# Patient Record
Sex: Male | Born: 1960 | Race: White | Hispanic: No | Marital: Married | Smoking: Never smoker
Health system: Southern US, Community
[De-identification: ages and names within clinical notes are randomized; demographics above are authoritative.]

## PROBLEM LIST (undated history)

## (undated) DIAGNOSIS — N200 Calculus of kidney: Secondary | ICD-10-CM

## (undated) DIAGNOSIS — K219 Gastro-esophageal reflux disease without esophagitis: Secondary | ICD-10-CM

## (undated) DIAGNOSIS — R1310 Dysphagia, unspecified: Secondary | ICD-10-CM

## (undated) DIAGNOSIS — I255 Ischemic cardiomyopathy: Secondary | ICD-10-CM

## (undated) DIAGNOSIS — R42 Dizziness and giddiness: Secondary | ICD-10-CM

## (undated) DIAGNOSIS — I509 Heart failure, unspecified: Secondary | ICD-10-CM

## (undated) DIAGNOSIS — I4901 Ventricular fibrillation: Secondary | ICD-10-CM

## (undated) DIAGNOSIS — I10 Essential (primary) hypertension: Secondary | ICD-10-CM

## (undated) DIAGNOSIS — I2510 Atherosclerotic heart disease of native coronary artery without angina pectoris: Secondary | ICD-10-CM

## (undated) DIAGNOSIS — F32A Depression, unspecified: Secondary | ICD-10-CM

## (undated) DIAGNOSIS — Z8669 Personal history of other diseases of the nervous system and sense organs: Secondary | ICD-10-CM

## (undated) DIAGNOSIS — R413 Other amnesia: Secondary | ICD-10-CM

## (undated) DIAGNOSIS — E782 Mixed hyperlipidemia: Secondary | ICD-10-CM

## (undated) DIAGNOSIS — I119 Hypertensive heart disease without heart failure: Secondary | ICD-10-CM

## (undated) DIAGNOSIS — F329 Major depressive disorder, single episode, unspecified: Secondary | ICD-10-CM

## (undated) DIAGNOSIS — G931 Anoxic brain damage, not elsewhere classified: Secondary | ICD-10-CM

## (undated) DIAGNOSIS — F419 Anxiety disorder, unspecified: Secondary | ICD-10-CM

## (undated) HISTORY — PX: PEG TUBE REMOVAL: SHX2187

## (undated) HISTORY — DX: Personal history of other diseases of the nervous system and sense organs: Z86.69

## (undated) HISTORY — DX: Dizziness and giddiness: R42

## (undated) HISTORY — PX: TRACHEOSTOMY CLOSURE: SHX6132

## (undated) HISTORY — DX: Anxiety disorder, unspecified: F41.9

## (undated) HISTORY — PX: ESOPHAGOGASTRODUODENOSCOPY: SHX1529

## (undated) HISTORY — DX: Mixed hyperlipidemia: E78.2

## (undated) HISTORY — PX: PEG TUBE PLACEMENT: SUR1034

## (undated) HISTORY — DX: Calculus of kidney: N20.0

## (undated) HISTORY — DX: Dysphagia, unspecified: R13.10

## (undated) HISTORY — PX: KIDNEY STONE SURGERY: SHX686

## (undated) HISTORY — PX: OTHER SURGICAL HISTORY: SHX169

## (undated) HISTORY — DX: Depression, unspecified: F32.A

## (undated) HISTORY — DX: Ventricular fibrillation: I49.01

## (undated) HISTORY — DX: Major depressive disorder, single episode, unspecified: F32.9

---

## 2000-09-21 ENCOUNTER — Emergency Department (HOSPITAL_COMMUNITY): Admission: EM | Admit: 2000-09-21 | Discharge: 2000-09-21 | Payer: Self-pay | Admitting: Pulmonary Disease

## 2000-09-21 ENCOUNTER — Encounter: Payer: Self-pay | Admitting: Emergency Medicine

## 2001-12-28 ENCOUNTER — Encounter: Payer: Self-pay | Admitting: Emergency Medicine

## 2001-12-28 ENCOUNTER — Emergency Department (HOSPITAL_COMMUNITY): Admission: EM | Admit: 2001-12-28 | Discharge: 2001-12-28 | Payer: Self-pay | Admitting: Emergency Medicine

## 2005-09-28 HISTORY — PX: CARDIAC CATHETERIZATION: SHX172

## 2005-10-11 ENCOUNTER — Emergency Department (HOSPITAL_COMMUNITY): Admission: EM | Admit: 2005-10-11 | Discharge: 2005-10-11 | Payer: Self-pay | Admitting: Emergency Medicine

## 2005-10-18 ENCOUNTER — Ambulatory Visit: Payer: Self-pay | Admitting: *Deleted

## 2005-10-18 ENCOUNTER — Inpatient Hospital Stay (HOSPITAL_COMMUNITY): Admission: EM | Admit: 2005-10-18 | Discharge: 2005-10-22 | Payer: Self-pay | Admitting: Emergency Medicine

## 2005-10-30 ENCOUNTER — Ambulatory Visit: Payer: Self-pay | Admitting: Cardiology

## 2005-12-08 ENCOUNTER — Ambulatory Visit: Payer: Self-pay | Admitting: Cardiology

## 2005-12-25 ENCOUNTER — Ambulatory Visit: Payer: Self-pay | Admitting: Cardiology

## 2005-12-29 ENCOUNTER — Ambulatory Visit: Payer: Self-pay | Admitting: Cardiology

## 2006-03-30 ENCOUNTER — Inpatient Hospital Stay (HOSPITAL_COMMUNITY): Admission: EM | Admit: 2006-03-30 | Discharge: 2006-05-01 | Payer: Self-pay | Admitting: Cardiology

## 2006-03-30 ENCOUNTER — Ambulatory Visit: Payer: Self-pay | Admitting: Emergency Medicine

## 2006-03-30 ENCOUNTER — Ambulatory Visit: Payer: Self-pay | Admitting: Pulmonary Disease

## 2006-03-30 ENCOUNTER — Encounter: Payer: Self-pay | Admitting: Emergency Medicine

## 2006-03-30 ENCOUNTER — Ambulatory Visit: Payer: Self-pay | Admitting: Cardiology

## 2006-03-30 HISTORY — PX: OTHER SURGICAL HISTORY: SHX169

## 2006-03-30 HISTORY — PX: CARDIAC CATHETERIZATION: SHX172

## 2006-03-31 ENCOUNTER — Encounter: Payer: Self-pay | Admitting: Cardiovascular Disease

## 2006-04-21 ENCOUNTER — Ambulatory Visit: Payer: Self-pay | Admitting: Physical Medicine & Rehabilitation

## 2006-05-01 ENCOUNTER — Inpatient Hospital Stay (HOSPITAL_COMMUNITY)
Admission: RE | Admit: 2006-05-01 | Discharge: 2006-06-02 | Payer: Self-pay | Admitting: Physical Medicine & Rehabilitation

## 2006-05-11 ENCOUNTER — Ambulatory Visit: Payer: Self-pay | Admitting: Dentistry

## 2006-06-17 ENCOUNTER — Encounter
Admission: RE | Admit: 2006-06-17 | Discharge: 2006-09-15 | Payer: Self-pay | Admitting: Physical Medicine & Rehabilitation

## 2006-06-17 ENCOUNTER — Emergency Department (HOSPITAL_COMMUNITY): Admission: EM | Admit: 2006-06-17 | Discharge: 2006-06-17 | Payer: Self-pay | Admitting: Family Medicine

## 2006-06-18 ENCOUNTER — Ambulatory Visit: Payer: Self-pay | Admitting: Cardiology

## 2006-07-10 ENCOUNTER — Encounter
Admission: RE | Admit: 2006-07-10 | Discharge: 2006-10-08 | Payer: Self-pay | Admitting: Physical Medicine & Rehabilitation

## 2006-07-10 ENCOUNTER — Ambulatory Visit: Payer: Self-pay | Admitting: Physical Medicine & Rehabilitation

## 2006-08-18 ENCOUNTER — Ambulatory Visit (HOSPITAL_COMMUNITY): Admission: RE | Admit: 2006-08-18 | Discharge: 2006-08-18 | Payer: Self-pay | Admitting: Internal Medicine

## 2006-08-21 ENCOUNTER — Ambulatory Visit: Payer: Self-pay | Admitting: Cardiology

## 2006-08-21 ENCOUNTER — Observation Stay (HOSPITAL_COMMUNITY): Admission: EM | Admit: 2006-08-21 | Discharge: 2006-08-21 | Payer: Self-pay | Admitting: Emergency Medicine

## 2006-09-16 ENCOUNTER — Encounter
Admission: RE | Admit: 2006-09-16 | Discharge: 2006-12-15 | Payer: Self-pay | Admitting: Physical Medicine & Rehabilitation

## 2006-09-18 ENCOUNTER — Emergency Department (HOSPITAL_COMMUNITY): Admission: EM | Admit: 2006-09-18 | Discharge: 2006-09-18 | Payer: Self-pay | Admitting: Emergency Medicine

## 2006-09-22 ENCOUNTER — Ambulatory Visit: Payer: Self-pay | Admitting: Cardiology

## 2006-10-06 ENCOUNTER — Encounter
Admission: RE | Admit: 2006-10-06 | Discharge: 2007-01-04 | Payer: Self-pay | Admitting: Physical Medicine & Rehabilitation

## 2006-10-06 ENCOUNTER — Ambulatory Visit: Payer: Self-pay | Admitting: Physical Medicine & Rehabilitation

## 2007-04-02 ENCOUNTER — Encounter
Admission: RE | Admit: 2007-04-02 | Discharge: 2007-04-05 | Payer: Self-pay | Admitting: Physical Medicine & Rehabilitation

## 2007-04-02 ENCOUNTER — Ambulatory Visit: Payer: Self-pay | Admitting: Physical Medicine & Rehabilitation

## 2007-04-08 ENCOUNTER — Ambulatory Visit: Payer: Self-pay | Admitting: Cardiology

## 2007-06-17 ENCOUNTER — Observation Stay (HOSPITAL_COMMUNITY): Admission: EM | Admit: 2007-06-17 | Discharge: 2007-06-18 | Payer: Self-pay | Admitting: Emergency Medicine

## 2007-06-17 ENCOUNTER — Ambulatory Visit: Payer: Self-pay | Admitting: Cardiology

## 2007-06-18 ENCOUNTER — Encounter: Payer: Self-pay | Admitting: Cardiology

## 2007-08-02 ENCOUNTER — Ambulatory Visit: Payer: Self-pay | Admitting: Cardiology

## 2008-04-21 ENCOUNTER — Ambulatory Visit: Payer: Self-pay | Admitting: Cardiology

## 2008-05-05 ENCOUNTER — Encounter: Payer: Self-pay | Admitting: Cardiology

## 2008-05-05 ENCOUNTER — Ambulatory Visit: Payer: Self-pay

## 2008-07-20 ENCOUNTER — Encounter
Admission: RE | Admit: 2008-07-20 | Discharge: 2008-10-18 | Payer: Self-pay | Admitting: Physical Medicine & Rehabilitation

## 2008-07-21 ENCOUNTER — Ambulatory Visit: Payer: Self-pay | Admitting: Physical Medicine & Rehabilitation

## 2008-09-15 ENCOUNTER — Ambulatory Visit: Payer: Self-pay | Admitting: Physical Medicine & Rehabilitation

## 2008-10-04 ENCOUNTER — Ambulatory Visit (HOSPITAL_COMMUNITY)
Admission: RE | Admit: 2008-10-04 | Discharge: 2008-10-04 | Payer: Self-pay | Admitting: Physical Medicine & Rehabilitation

## 2008-10-16 ENCOUNTER — Encounter (INDEPENDENT_AMBULATORY_CARE_PROVIDER_SITE_OTHER): Payer: Self-pay | Admitting: *Deleted

## 2008-10-25 ENCOUNTER — Telehealth: Payer: Self-pay | Admitting: Cardiology

## 2008-11-10 DIAGNOSIS — F329 Major depressive disorder, single episode, unspecified: Secondary | ICD-10-CM | POA: Insufficient documentation

## 2008-11-10 DIAGNOSIS — I2109 ST elevation (STEMI) myocardial infarction involving other coronary artery of anterior wall: Secondary | ICD-10-CM

## 2008-11-10 DIAGNOSIS — I2589 Other forms of chronic ischemic heart disease: Secondary | ICD-10-CM

## 2008-11-10 DIAGNOSIS — E785 Hyperlipidemia, unspecified: Secondary | ICD-10-CM | POA: Insufficient documentation

## 2008-11-10 DIAGNOSIS — F411 Generalized anxiety disorder: Secondary | ICD-10-CM | POA: Insufficient documentation

## 2008-11-10 DIAGNOSIS — I4901 Ventricular fibrillation: Secondary | ICD-10-CM

## 2008-11-10 DIAGNOSIS — Z8679 Personal history of other diseases of the circulatory system: Secondary | ICD-10-CM | POA: Insufficient documentation

## 2008-11-10 DIAGNOSIS — I10 Essential (primary) hypertension: Secondary | ICD-10-CM | POA: Insufficient documentation

## 2008-11-10 DIAGNOSIS — Z8669 Personal history of other diseases of the nervous system and sense organs: Secondary | ICD-10-CM

## 2008-11-10 DIAGNOSIS — I255 Ischemic cardiomyopathy: Secondary | ICD-10-CM | POA: Insufficient documentation

## 2008-11-10 DIAGNOSIS — R1310 Dysphagia, unspecified: Secondary | ICD-10-CM | POA: Insufficient documentation

## 2008-11-15 ENCOUNTER — Encounter
Admission: RE | Admit: 2008-11-15 | Discharge: 2008-11-17 | Payer: Self-pay | Admitting: Physical Medicine & Rehabilitation

## 2008-11-17 ENCOUNTER — Ambulatory Visit: Payer: Self-pay | Admitting: Cardiology

## 2008-11-17 ENCOUNTER — Telehealth: Payer: Self-pay | Admitting: Cardiology

## 2008-11-17 ENCOUNTER — Ambulatory Visit: Payer: Self-pay | Admitting: Physical Medicine & Rehabilitation

## 2008-11-20 ENCOUNTER — Telehealth (INDEPENDENT_AMBULATORY_CARE_PROVIDER_SITE_OTHER): Payer: Self-pay | Admitting: *Deleted

## 2008-11-21 ENCOUNTER — Ambulatory Visit: Payer: Self-pay

## 2008-11-21 ENCOUNTER — Encounter: Payer: Self-pay | Admitting: Cardiology

## 2008-11-24 ENCOUNTER — Encounter: Payer: Self-pay | Admitting: Cardiology

## 2008-11-29 ENCOUNTER — Telehealth: Payer: Self-pay | Admitting: Cardiology

## 2008-11-30 ENCOUNTER — Telehealth: Payer: Self-pay | Admitting: Cardiology

## 2008-12-01 ENCOUNTER — Ambulatory Visit: Payer: Self-pay | Admitting: Cardiology

## 2008-12-01 ENCOUNTER — Inpatient Hospital Stay (HOSPITAL_COMMUNITY): Admission: EM | Admit: 2008-12-01 | Discharge: 2008-12-05 | Payer: Self-pay | Admitting: Emergency Medicine

## 2009-01-03 ENCOUNTER — Encounter (HOSPITAL_COMMUNITY): Admission: RE | Admit: 2009-01-03 | Discharge: 2009-02-02 | Payer: Self-pay | Admitting: Cardiology

## 2009-01-04 ENCOUNTER — Ambulatory Visit: Payer: Self-pay | Admitting: Internal Medicine

## 2009-01-04 ENCOUNTER — Encounter: Payer: Self-pay | Admitting: Nurse Practitioner

## 2009-01-04 DIAGNOSIS — G931 Anoxic brain damage, not elsewhere classified: Secondary | ICD-10-CM | POA: Insufficient documentation

## 2009-01-31 ENCOUNTER — Encounter: Payer: Self-pay | Admitting: Cardiology

## 2009-03-29 ENCOUNTER — Encounter: Payer: Self-pay | Admitting: Cardiology

## 2009-04-06 ENCOUNTER — Encounter (INDEPENDENT_AMBULATORY_CARE_PROVIDER_SITE_OTHER): Payer: Self-pay | Admitting: *Deleted

## 2009-04-27 ENCOUNTER — Ambulatory Visit: Payer: Self-pay | Admitting: Cardiology

## 2009-07-16 ENCOUNTER — Ambulatory Visit: Payer: Self-pay | Admitting: Cardiology

## 2009-07-16 DIAGNOSIS — I2510 Atherosclerotic heart disease of native coronary artery without angina pectoris: Secondary | ICD-10-CM

## 2009-07-19 ENCOUNTER — Telehealth (INDEPENDENT_AMBULATORY_CARE_PROVIDER_SITE_OTHER): Payer: Self-pay | Admitting: *Deleted

## 2009-07-20 LAB — CONVERTED CEMR LAB
Alkaline Phosphatase: 109 units/L (ref 39–117)
BUN: 10 mg/dL (ref 6–23)
Bilirubin, Direct: 0.1 mg/dL (ref 0.0–0.3)
CO2: 25 meq/L (ref 19–32)
Calcium: 9.3 mg/dL (ref 8.4–10.5)
Chloride: 105 meq/L (ref 96–112)
Cholesterol: 148 mg/dL (ref 0–200)
Creatinine, Ser: 1.2 mg/dL (ref 0.4–1.5)
GFR calc non Af Amer: 68.55 mL/min (ref >60)
LDL Cholesterol: 91 mg/dL (ref 0–99)
Potassium: 4.4 meq/L (ref 3.5–5.1)
Total CHOL/HDL Ratio: 4
Total Protein: 7 g/dL (ref 6.0–8.3)
VLDL: 16 mg/dL (ref 0.0–40.0)

## 2009-07-23 ENCOUNTER — Ambulatory Visit: Payer: Self-pay | Admitting: Internal Medicine

## 2009-07-23 ENCOUNTER — Encounter (HOSPITAL_COMMUNITY): Admission: RE | Admit: 2009-07-23 | Discharge: 2009-10-05 | Payer: Self-pay | Admitting: Cardiology

## 2009-07-23 ENCOUNTER — Ambulatory Visit: Payer: Self-pay

## 2009-08-10 ENCOUNTER — Telehealth: Payer: Self-pay | Admitting: Cardiology

## 2009-11-01 ENCOUNTER — Encounter (HOSPITAL_COMMUNITY): Admission: RE | Admit: 2009-11-01 | Discharge: 2009-12-01 | Payer: Self-pay | Admitting: Family Medicine

## 2009-12-06 ENCOUNTER — Encounter (HOSPITAL_COMMUNITY): Admission: RE | Admit: 2009-12-06 | Discharge: 2010-01-05 | Payer: Self-pay | Admitting: Family Medicine

## 2010-07-03 ENCOUNTER — Ambulatory Visit
Admission: RE | Admit: 2010-07-03 | Discharge: 2010-07-03 | Payer: Self-pay | Source: Home / Self Care | Attending: Cardiology | Admitting: Cardiology

## 2010-07-03 ENCOUNTER — Encounter: Payer: Self-pay | Admitting: Cardiology

## 2010-07-09 ENCOUNTER — Telehealth (INDEPENDENT_AMBULATORY_CARE_PROVIDER_SITE_OTHER): Payer: Self-pay | Admitting: Radiology

## 2010-07-10 ENCOUNTER — Encounter: Payer: Self-pay | Admitting: *Deleted

## 2010-07-10 ENCOUNTER — Encounter: Payer: Self-pay | Admitting: Cardiology

## 2010-07-10 ENCOUNTER — Ambulatory Visit: Admission: RE | Admit: 2010-07-10 | Discharge: 2010-07-10 | Payer: Self-pay | Source: Home / Self Care

## 2010-07-10 ENCOUNTER — Encounter (HOSPITAL_COMMUNITY)
Admission: RE | Admit: 2010-07-10 | Discharge: 2010-07-30 | Payer: Self-pay | Source: Home / Self Care | Attending: Cardiology | Admitting: Cardiology

## 2010-07-28 LAB — CONVERTED CEMR LAB
BUN: 10 mg/dL (ref 6–23)
Basophils Absolute: 0 10*3/uL (ref 0.0–0.1)
Bilirubin, Direct: 0.1 mg/dL (ref 0.0–0.3)
CO2: 31 meq/L (ref 19–32)
Chloride: 105 meq/L (ref 96–112)
Cholesterol: 139 mg/dL (ref 0–200)
Eosinophils Relative: 4.6 % (ref 0.0–5.0)
GFR calc Af Amer: 103 mL/min
GFR calc non Af Amer: 85 mL/min
HCT: 41 % (ref 39.0–52.0)
Hemoglobin: 13.8 g/dL (ref 13.0–17.0)
LDL Cholesterol: 82 mg/dL (ref 0–99)
Monocytes Absolute: 0.7 10*3/uL (ref 0.1–1.0)
Neutrophils Relative %: 49.7 % (ref 43.0–77.0)
Potassium: 4.5 meq/L (ref 3.5–5.1)
RBC: 4.64 M/uL (ref 4.22–5.81)
Sodium: 140 meq/L (ref 135–145)
Sodium: 141 meq/L (ref 135–145)
Triglycerides: 94 mg/dL (ref 0–149)

## 2010-07-30 NOTE — Progress Notes (Signed)
Summary: rx called in today  Phone Note Refill Request Call back at Home Phone [phone] Message from:  Patient  Refills Requested: Medication #1:  LASIX 40MG  QD THIS WAS PERSCRIBED BY A DIFFERENT MD BUT SINCE DR [place] Souter WANTS HIM TO STAY ON IT CAN WE PLZ CALL INTO [facility] ON [address].  Initial call taken by: Omer Jack,  August 10, 2009 12:48 PM Caller: Spouse    Prescriptions: FUROSEMIDE 20 MG TABS (FUROSEMIDE) 1 tab once daily  #30 x 11   Entered by:   Danielle Rankin, CMA   Authorized by:   Gaylord Shih, MD, [facility]   Signed by:   Danielle Rankin, CMA on 08/10/2009   Method used:   Telephoned to ...       [facility]  [address]  [phone]* (retail)       [address]       [place], [place]  [address]       Ph: [phone] or [phone]       Fax: [phone]   RxID:   [id number]

## 2010-07-30 NOTE — Assessment & Plan Note (Signed)
Summary: PER CHECK OUT/SF   Visit Type:  ROV Primary Provider:  Fuller Mandril  CC:  fatigue taking napps.. wife says pt wants to go bed at 6pm and is not sleeping well..wife states that pt does c/o quick sharp pain in the middle of his chest once in awhile.  History of Present Illness: [name] returns today for followup of his ischemic cardiac myopathy. Other than some sharp chest pain, he's had no specific complaints. His biggest concern from his wife standpoint is fatigue.  He denies orthopnea, PND, peripheral edema, shortness of breath, syncope or palpitations.  Current Medications (verified): 1)  Nitroglycerin 0.4 Mg Subl (Nitroglycerin) .... One Tablet Under Tongue Every 5 Minutes As Needed For Chest Pain---May Repeat Times Three 2)  Coreg 25 Mg Tabs (Carvedilol) .Marland Kitchen.. 1 Tab Two Times A Day 3)  Plavix 75 Mg Tabs (Clopidogrel Bisulfate) .... Take One Tablet By Mouth Daily 4)  Furosemide 20 Mg Tabs (Furosemide) .Marland Kitchen.. 1 Tab Once Daily 5)  Omeprazole 40 Mg Cpdr (Omeprazole) .Marland Kitchen.. 1 Tab Once Daily 6)  Crestor 10 Mg Tabs (Rosuvastatin Calcium) .Marland Kitchen.. 1 Tab Once Daily 7)  Aspirin Ec 325 Mg Tbec (Aspirin) .... Take One Tablet By Mouth Daily 8)  Prozac 20 Mg Caps (Fluoxetine Hcl) .Marland Kitchen.. 1 Tab Once Daily 9)  Eq Loratadine 10 Mg Tabs (Loratadine) .... As Needed 10)  Viagra 50 Mg Tabs (Sildenafil Citrate) .... As Needed  Allergies: 1)  ! * Dilaudid 2)  ! Seldane 3)  ! * Pseudoephedrine 4)  ! Ativan 5)  ! Lipitor  Past History:  Past Medical History: Last updated: 01/04/2009 CARDIOMYOPATHY, ISCHEMIC (ICD-414.8)      a. EF 45% by LV Gram 11/2005 VENTRICULAR FIBRILLATION ARREST 03/2006(ICD-427.41) CAD (ICD-414.00)      a. s/p nstemi 09/2005 with PCI/BMS of the LAD      b. s/p vf arrest/cardiogenic shock/nstemi 03/2006 with occluded LAD           i.  03/2006 pci LAD (taxus DES x2), ptca d2.      c. 11/2008 - abnl MV - ant. scar with peri-infarct ischemia      d. 11/2008 - pci LAD with  Xience DES (distal to previous stents) HYPERTENSION, UNSPECIFIED (ICD-401.9) HYPERLIPIDEMIA-MIXED (ICD-272.4)  ANXIETY (ICD-300.00) DEPRESSION (ICD-311) DYSPHAGIA UNSPECIFIED (ICD-787.20) SEIZURE DISORDER, HX OF (ICD-V12.49) ANOXIC BRAIN INJURY   Past Surgical History: Last updated: 11/10/2008 Urolithiasis Cardiac catheterization with stenting October 21, 2005, with bare metal stent to the mid LAD (Minivision 2.5 x 18 mm) with Provisional balloon angioplasty to the second diagonal.   Cardiac catheterization on March 31, 2006, with stenting of the mid LAD with Taxus 2.5 x 20 mm and 2.5 x 8 mm stents.     Bedside tracheostomy with #6 Shiley. 04/14/2006 PROCEDURES:  04/13/2006 1. Esophagogastroduodenoscopy. 2. Percutaneous endoscopic gastrostomy tube placement.           Family History: Last updated: 01/04/2009 Mother:  began having myocardial infarctions in her mid to early 38's.  S/P CABG. Father:  myocardial infarction at approximately age 34 Siblings:  None of the patient's siblings have premature coronary artery disease.    Social History: Last updated: 11/10/2008 Married  Tobacco Use - No.  Alcohol Use - no Drug Use - no  Risk Factors: Smoking Status: never (11/10/2008)  Review of Systems       negative other than history of present illness  Vital Signs:  Patient profile:   50 year old male Height:  73 inches Weight:      243 pounds BMI:     32.18 Pulse rate:   62 / minute Pulse rhythm:   regular BP sitting:   100 / 70  (left arm) Cuff size:   large  Vitals Entered By: Danielle Rankin, CMA (July 16, 2009 9:02 AM)  Physical Exam  General:  Well developed, well nourished, in no acute distress. Head:  normocephalic and atraumatic Eyes:  PERRLA/EOM intact; conjunctiva and lids normal. Mouth:  poor dentition.   Msk:  Back normal, normal gait. Muscle strength and tone normal. Pulses:  pulses normal in all 4 extremities Extremities:  No clubbing or  cyanosis. Neurologic:  Alert and oriented x 3. Skin:  Intact without lesions or rashes. Psych:  Baseline   Problems:  Medical Problems Added: 1)  Dx of Encounter For Long-term Use of Other Medications  (ICD-V58.69) 2)  Dx of Cad, Native Vessel  (ICD-414.01)  Impression & Recommendations:  Problem # 1:  CAD, NATIVE VESSEL (ICD-414.01) Assessment Unchanged His fatigue could be an ischemic equivalent. He is very difficult historian but his chest pain today does not sound coronary. It has been 6 months since his revascularization with a stent to his LAD. We will repeat his Myoview. His updated medication list for this problem includes:    Nitroglycerin 0.4 Mg Subl (Nitroglycerin) ..... One tablet under tongue every 5 minutes as needed for chest pain---may repeat times three    Coreg 25 Mg Tabs (Carvedilol) .Marland Kitchen... 1 tab two times a day    Plavix 75 Mg Tabs (Clopidogrel bisulfate) .Marland Kitchen... Take one tablet by mouth daily    Aspirin Ec 325 Mg Tbec (Aspirin) .Marland Kitchen... Take one tablet by mouth daily  Problem # 2:  MYOCARDIAL INFARCTION, ACUTE, ANTERIOR Skylarr Liz (ICD-410.10) Assessment: Unchanged  His updated medication list for this problem includes:    Nitroglycerin 0.4 Mg Subl (Nitroglycerin) ..... One tablet under tongue every 5 minutes as needed for chest pain---may repeat times three    Coreg 25 Mg Tabs (Carvedilol) .Marland Kitchen... 1 tab two times a day    Plavix 75 Mg Tabs (Clopidogrel bisulfate) .Marland Kitchen... Take one tablet by mouth daily    Aspirin Ec 325 Mg Tbec (Aspirin) .Marland Kitchen... Take one tablet by mouth daily  Problem # 3:  HYPERLIPIDEMIA-MIXED (ICD-272.4) I will obtain fasting lipids today and a [facility] metabolic panel. His updated medication list for this problem includes:    Crestor 10 Mg Tabs (Rosuvastatin calcium) .Marland Kitchen... 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 Please refer to the Current Medication list given to you today. 4)  Your physician has requested that you have an adenosine myoview.  For further information please visit [email or web address]  Please follow instruction sheet, as given.

## 2010-07-30 NOTE — Progress Notes (Signed)
Summary: Nuclear Pre-Procedure  Phone Note Outgoing Call Call back at [facility] Phone [phone]   Call placed by: Stanton Kidney, EMT-P,  July 19, 2009 1:58 PM Action Taken: Phone Call Completed Summary of Call: Reviewed information on Myoview Information Sheet (see scanned document for further details).  Spoke with Patients wife.     Nuclear Med Background Indications for Stress Test: Evaluation for Ischemia, Stent Patency, PTCA Patency   History: Angioplasty, Echo, Heart Catheterization, Myocardial Infarction, Myocardial Perfusion Study, Stents  History Comments: 4/07 Stent LAD,PTCA DX2 10/07 AWMI>cardiac arrest(with hypoxic brain injury post MI)>Cath: total occlusion LAD/stent thrombus>stent LAD,residual LAD/CFX;h/o ICM 11/09 Echo: EF=65% 5/10 MPS:EF=62%, scar apex,lateral,anterior w/ mod peri-infarct ischemia 6/10 Heart Cath: EF=45% Hx. ICM, seizure disorder,hypoxic brain injury after cardiac arrest.  Symptoms: Chest Pain, Fatigue  Symptoms Comments: .   Nuclear Pre-Procedure Cardiac Risk Factors: Family History - CAD, Hypertension, Lipids, Obesity Height (in): 73  Nuclear Med Study Referring MD:  Valera Castle

## 2010-07-30 NOTE — Assessment & Plan Note (Signed)
Summary: Cardiology Nuclear Study  Nuclear Med Background Indications for Stress Test: Evaluation for Ischemia, Stent Patency, PTCA Patency   History: Angioplasty, Echo, Heart Catheterization, Myocardial Infarction, Myocardial Perfusion Study, Stents  History Comments: 4/07 Stent LAD,PTCA DX2 10/07 AWMI>cardiac arrest(with hypoxic brain injury post MI)>Cath: total occlusion LAD/stent thrombus>stent LAD,residual LAD/CFX;h/o ICM 11/09 Echo: EF=65% 5/10 MPS:EF=62%, scar apex,lateral,anterior w/ mod peri-infarct ischemia 6/10 Heart Cath: EF=45% Hx. ICM, seizure disorder,hypoxic brain injury after cardiac arrest.  Symptoms: Chest Pain, Fatigue  Symptoms Comments: .   Nuclear Pre-Procedure Cardiac Risk Factors: Family History - CAD, Hypertension, Lipids, Obesity Caffeine/Decaff Intake: None NPO After: 8:00 PM Lungs: clear IV 0.9% NS with Angio Cath: 22g     IV Site: (R) Hand IV Started by: Irean Hong RN Chest Size (in) 46     Height (in): 73 Weight (lb): 239 BMI: 31.65  Nuclear Med Study 1 or 2 day study:  1 day     Stress Test Type:  Eugenie Birks Reading MD:  Dietrich Pates, MD     Referring MD:  Valera Castle Resting Radionuclide:  Technetium 92m Tetrofosmin     Resting Radionuclide Dose:  11.0 mCi  Stress Radionuclide:  Technetium 35m Tetrofosmin     Stress Radionuclide Dose:  33.0 mCi   Stress Protocol   Lexiscan: 0.4 mg   Stress Test Technologist:  Milana Na EMT-P     Nuclear Technologist:  Burna Mortimer Deal RT-N  Rest Procedure  Myocardial perfusion imaging was performed at rest 45 minutes following the intravenous administration of Myoview Technetium 5m Tetrofosmin.  Stress Procedure  The patient received IV Lexiscan 0.4 mg over 15-seconds.  Myoview injected at 30-seconds. No symptoms with infusion. There were no significant changes with infusion.  Quantitative spect images were obtained after a 45 minute delay.  QPS Raw Data Images:  Soft tissue (diaphragm, bowel  activity) underlie heart. Stress Images:  Defect with decreased counts in the anterolateral wall (mid, distal), inferolateral wall (distal) and apex. Rest Images:  Minimal improvement from the stress images. Transient Ischemic Dilatation:  1.10  (Normal <1.22)  Lung/Heart Ratio:  .38  (Normal <0.45)  Quantitative Gated Spect Images QGS EDV:  148 ml QGS ESV:  63 ml QGS EF:  57 % QGS cine images:  Distal anterior, lateral and apical hypokinesis/akinesis.   Overall Impression  Exercise Capacity: Lexiscan BP Response: Normal blood pressure response. Clinical Symptoms: No chest pain ECG Impression: No significant ST segment change suggestive of ischemia. Overall Impression: Scar with mild periinfarct ischemia in the distal anterolateral, apical and distal inferolateral walls. Overall Impression Comments: Since previous study stress defect is less prominent, improvement is less prominent.  Appended Document: Cardiology Nuclear Study STABLE  Appended Document: Cardiology Nuclear Study LMTCB./CY  Appended Document: Cardiology Nuclear Study PT AWARE./CY

## 2010-08-01 NOTE — Assessment & Plan Note (Signed)
Summary: 1 yr f/u   Visit Type:  Follow-up Primary Provider:  Fuller Mandril  CC:  chest pressure & sharp pains / SOB.  History of Present Illness: [name] Cummings comes in today for chest discomfort and a history of coronary disease.  He is a difficult historian because of his previous anoxic brain injury. However, his wife says even having more complaints of chest discomfort than usual. He also some mild dyspnea on exertion.  A stress Myoview a year and a half ago showed ischemia. Showed an 80% lesion beyond the stent. This was opened successfully and he's done well.  Denies orthopnea, PND or edema. He is compliant with his medicines.  Clinical Reports Reviewed:  Cardiac Cath:  12/04/2008: Cardiac Cath Findings:   Left ventriculogram:  The left ventriculogram was performed in the RAO   projection showed hypokinesis of the anterolateral Jennalynn Rivard and apex.  The   estimated fraction was 45%.      Following stenting of the lesion in the mid LAD, the stenosis improved   from 80% to 0%.      CONCLUSIONS:   1. Coronary artery disease, status post previous percutaneous coronary       interventions as described above with 40% narrowing in the proximal       LAD, 30% narrowing within the stent in the mid LAD, and 80%       narrowing in the mid LAD after the stent, 70-80% narrowing in the       large ramus branch of the circumflex artery with 40% ostial       stenosis in the dominant circumflex artery, and 80% narrowing in       the proximal portion of a nondominant right coronary artery with       anterolateral Eriyanna Kofoed and apical Yann Biehn hypokinesis and estimated       ejection fraction of 45%.   2. Successful PCI of the lesion in the mid LAD distal to the       previously placed stent using a XIENCE drug-eluting stent with       improvement in center narrowing from 80% to 0%.      DISPOSITION:  The patient returned to [facility] room for further   observation.               [name] Cummings  [name] Chance, MD, [facility]   Electronically Signed     Cardiac Cath Findings:   Left ventriculogram:  The left ventriculogram was performed in the RAO   projection showed hypokinesis of the anterolateral Natale Thoma and apex.  The   estimated fraction was 45%.      Following stenting of the lesion in the mid LAD, the stenosis improved   from 80% to 0%.      CONCLUSIONS:   1. Coronary artery disease, status post previous percutaneous coronary       interventions as described above with 40% narrowing in the proximal       LAD, 30% narrowing within the stent in the mid LAD, and 80%       narrowing in the mid LAD after the stent, 70-80% narrowing in the       large ramus branch of the circumflex artery with 40% ostial       stenosis in the dominant circumflex artery, and 80% narrowing in       the proximal portion of a nondominant right coronary artery with       anterolateral Hanson Medeiros and apical Lafern Brinkley  hypokinesis and estimated       ejection fraction of 45%.   2. Successful PCI of the lesion in the mid LAD distal to the       previously placed stent using a XIENCE drug-eluting stent with       improvement in center narrowing from 80% to 0%.      DISPOSITION:  The patient returned to [facility] room for further   observation.               [name] Cummings [name] Chance, MD, [facility]   Electronically Signed   03/31/2006: Cardiac Cath Findings:   CONCLUSION:  1. Acute anterior Vollie Aaron myocardial infarction (non-ST-elevation myocardial      infarction) with out-of-hospital ventricular fibrillation arrest and      cardiogenic shock.  2. Total occlusion of the proximal to mid left anterior descending due to      stent thrombosis in the previous bare-metal stent, 60% stenosis in the      proximal left anterior descending, 80% stenosis in the marginal branch      of the circumflex artery, no significant obstruction in a small     nondominant right coronary and anterolateral Kullen Tomasetti hypokinesis and      apical Harriet Bollen akinesis with  an estimated fraction of 30%.  3. Successful percutaneous coronary intervention of the totally occluded      proximal to mid left anterior descending (stent thrombosis) using 2      overlapping Taxus stents with improvement in the percentage in area of      narrowing from 100% to 0%, but with significant diffuse disease in the      left anterior descending.   DISPOSITION:  The patient will be returned to the post-angioplasty unit for  further observation.  His outlook remains guarded, both from a neurological  standpoint and from a cardiac standpoint.         ______________________________  Everardo Beals. [name] Chance, MD, [facility]  10/21/2005: Cardiac Cath Findings:   COMPLICATIONS:  None.   IMPRESSION/RECOMMENDATIONS:  Successful bare metal stenting of the mid left  anterior descending with balloon angioplasty of the second diagonal branch  with excellent angiographic result.  The patient should be maintained on  Plavix for a year and aspirin indefinitely.  High-dose statin will be  initiated.   Salvadore Farber, M.D. [facility]  Electronically Signed Cardiac Cath Findings:   DIAGNOSES:  1.  Two-vessel coronary artery disease as outlined including a 95% mid left      anterior descending stenosis involving the second diagonal branch origin      as well as 60 to 70% disease in the proximal circumflex with 60% diffuse      disease in the proximal portion of a large obtuse marginal branch.  2.  The coronary system is left dominant.  3.  Left ventricular ejection fraction of approximately 60% with no focal      anterior or inferior Heddy Vidana motion abnormality, 1 to 2% mitral      regurgitation in the setting of ventricular ectopy and a left      ventricular end-diastolic pressure of 13 mmHg.   DISCUSSION:  I reviewed the results with the patient and his extended  family. Dr. Samule Ohm and I reviewed the films. One wonders whether the patient  recent seizure episode was perhaps provoked by an ischemic event  with  associated hypoxia, particularly in light of his fairly reassuring  neurological evaluation at this point. His symptoms are concerning for  an  acute coronary syndrome, and the plan at this point will be to proceed with  a percutaneous intervention to address the left anterior descending stenosis  and subsequently optimize medical therapy. Intervention could be considered  on the circumflex, driven by symptom control and clinical stability. Further  plans to follow.         ______________________________  Jonelle Sidle, M.D. [facility]  Nuclear Study:  07/23/2009:  Excerise capacity: Lexiscan  Blood Pressure response: Normal blood pressure response  Clinical symptoms: No chest pain  ECG impression: No significant ST segment change suggestive of ischemia  Overall impression: Scar with mild periinfarct ischemia in the distal anterolateral, apical and distal inferolateral walls.  Overall Impression Comments: Since previous study stress defect is less prominent, improvement is less prominent.  Dietrich Pates, MD, [facility]   11/21/2008:  Excerise capacity: Adenosine study with no exercise  Blood Pressure response: Normal blood pressure response  Clinical symptoms: None  ECG impression: No significant ST segment change suggestive of ischemia  Overall impression: There is scar at the apex and the distal lateral Teaghan Melrose and the distal anterior Latreshia Beauchaine. There is moderate peri-infarct ischemia.  Willa Rough, MD, [facility]   06/18/2007:   Reason for Exam:  Chest pain.   ADENOSINE MYOVIEW REPORT:   Description of Test:  One-day rest stress protocol.  Adenosine was   infused in the standard fashion.  Myoview was injected at rest and at   peak exercise.   EKG Data:  Resting EKG showed sinus rhythm with a question of   previous anterior infarct.  There were no STT wave changes with   adenosine.   Raw images show no evidence of patient motion.  There was no abnormal   extracardiac uptake noted.    Scintigraphic Data:  Images were reconstructed in the horizontal and   vertical long axises as well as the short axis.  There was evidence   of a previous apical and distal anterolateral Charne Mcbrien infarct.  There is   perhaps mild overlying ischemia in the distal anterior Ulice Follett.   However, study is somewhat suboptimal in quality.   Post-stress gated Carel Carrier motion shows ejection fraction of 67%.  End   diastolic volume is 150 cc.  End systolic volume is 65 cc.  There is   apical akinesis.  There is hypokinesis of the distal anterior Viyan Rosamond.   CONCLUSION:   This is an abnormal adenosine Myoview.  There is evidence of a   previous apical and distal anterior Nhan Qualley infarct.  There is a   question of mild overlying ischemia in the anterior Stark Aguinaga.  Additional Information  [id number] RESULT STATUS : P  External image : [id number]  External IF Update Timestamp : 2007-06-18:16:[id number]  10/20/2005:    IMPRESSION:   Grossly positive study with pharmacologically induced ischemia in the   distal anterior Vickie Melnik, left ventricular apex and distal lateral Jalani Cullifer.   These segments also demonstrate hypokinesis on the gated study with   overall intact left ventricular function and estimated ejection   fraction of 60%.    Read By:  Irish Lack,  M.D.   Released By:  Irish Lack,  M.D.   Current Medications (verified): 1)  Nitroglycerin 0.4 Mg Subl (Nitroglycerin) .... One Tablet Under Tongue Every 5 Minutes As Needed For Chest Pain---May Repeat Times Three 2)  Coreg 25 Mg Tabs (Carvedilol) .Marland Kitchen.. 1 Tab Two Times A Day 3)  Plavix 75 Mg Tabs (Clopidogrel Bisulfate) .... Take One Tablet By Mouth Daily  4)  Furosemide 40 Mg Tabs (Furosemide) .... Take One Tablet By Mouth Daily. 5)  Omeprazole 40 Mg Cpdr (Omeprazole) .Marland Kitchen.. 1 Tab Once Daily 6)  Crestor 10 Mg Tabs (Rosuvastatin Calcium) .Marland Kitchen.. 1 Tab Once Daily 7)  Aspirin Ec 325 Mg Tbec (Aspirin) .... Take One Tablet By Mouth Daily 8)  Lexapro 10 Mg Tabs  (Escitalopram Oxalate) .... Take 1 Tablet By Mouth Once A Day 9)  Xyzal 5 Mg Tabs (Levocetirizine Dihydrochloride) .... Take 1 Tablet By Mouth Once A Day 10)  Cialis 5 Mg Tabs (Tadalafil) .... As Needed 11)  Toviaz 8 Mg Xr24h-Tab (Fesoterodine Fumarate) .... Take 1 Tablet By Mouth Once A Day  Allergies (verified): 1)  ! * Dilaudid 2)  ! Seldane 3)  ! * Pseudoephedrine 4)  ! Ativan 5)  ! Lipitor  Past History:  Past Medical History: Last updated: 01/04/2009 CARDIOMYOPATHY, ISCHEMIC (ICD-414.8)      a. EF 45% by LV Gram 11/2005 VENTRICULAR FIBRILLATION ARREST 03/2006(ICD-427.41) CAD (ICD-414.00)      a. s/p nstemi 09/2005 with PCI/BMS of the LAD      b. s/p vf arrest/cardiogenic shock/nstemi 03/2006 with occluded LAD           i.  03/2006 pci LAD (taxus DES x2), ptca d2.      c. 11/2008 - abnl MV - ant. scar with peri-infarct ischemia      d. 11/2008 - pci LAD with Xience DES (distal to previous stents) HYPERTENSION, UNSPECIFIED (ICD-401.9) HYPERLIPIDEMIA-MIXED (ICD-272.4)  ANXIETY (ICD-300.00) DEPRESSION (ICD-311) DYSPHAGIA UNSPECIFIED (ICD-787.20) SEIZURE DISORDER, HX OF (ICD-V12.49) ANOXIC BRAIN INJURY   Past Surgical History: Last updated: 11/10/2008 Urolithiasis Cardiac catheterization with stenting October 21, 2005, with bare metal stent to the mid LAD (Minivision 2.5 x 18 mm) with Provisional balloon angioplasty to the second diagonal.   Cardiac catheterization on March 31, 2006, with stenting of the mid LAD with Taxus 2.5 x 20 mm and 2.5 x 8 mm stents.     Bedside tracheostomy with #6 Shiley. 04/14/2006 PROCEDURES:  04/13/2006 1. Esophagogastroduodenoscopy. 2. Percutaneous endoscopic gastrostomy tube placement.           Family History: Last updated: 01/04/2009 Mother:  began having myocardial infarctions in her mid to early 39's.  S/P CABG. Father:  myocardial infarction at approximately age 56 Siblings:  None of the patient's siblings have premature coronary artery  disease.    Social History: Last updated: 11/10/2008 Married  Tobacco Use - No.  Alcohol Use - no Drug Use - no  Risk Factors: Smoking Status: never (11/10/2008)  Vital Signs:  Patient profile:   50 year old male Height:      73 inches Weight:      245 pounds BMI:     32.44 Pulse rate:   59 / minute BP sitting:   110 / 66  (left arm) Cuff size:   large  Vitals Entered By: Hardin Negus, RMA (July 03, 2010 10:05 AM)  Physical Exam  General:  muscular, overweight, in no acute distress Head:  normocephalic and atraumatic Eyes:  PERRLA/EOM intact; conjunctiva and lids normal. Neck:  Neck supple, no JVD. No masses, thyromegaly or abnormal cervical nodes. Chest Colie Josten:  no deformities or breast masses noted Lungs:  Clear bilaterally to auscultation and percussion. Heart:  PMI nondisplaced, regular rate and rhythm, normal S1-S2, no bruits Abdomen:  soft, good bowel sounds, Msk:  Back normal, normal gait. Muscle strength and tone normal. Pulses:  pulses normal in all 4 extremities  Extremities:  No clubbing or cyanosis. Neurologic:  Alert and oriented x 3. Skin:  Intact without lesions or rashes. Psych:  Normal affect.   EKG  Procedure date:  07/03/2010  Findings:      normal sinus rhythm, poor R wave progression anterior precordium, no change.  Impression & Recommendations:  Problem # 1:  CAD, NATIVE VESSEL (ICD-414.01) I am concerned about his recurrent chest discomfort particularly pressure. He is a difficult historian. We have found ischemia in the past and successfully intervened on his LAD. We'll repeat a stress Myoview His updated medication list for this problem includes:    Nitroglycerin 0.4 Mg Subl (Nitroglycerin) ..... One tablet under tongue every 5 minutes as needed for chest pain---may repeat times three    Coreg 25 Mg Tabs (Carvedilol) .Marland Kitchen... 1 tab two times a day    Plavix 75 Mg Tabs (Clopidogrel bisulfate) .Marland Kitchen... Take one tablet by mouth daily     Aspirin Ec 325 Mg Tbec (Aspirin) .Marland Kitchen... 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 Daleen Squibb 2)  Your physician recommends that you continue on your current medications as directed. Please refer to the Current Medication list given to you today. 3)  Your physician has requested that you have an lexiscan  myoview.  For further information please visit [email or web address]  Please follow instruction sheet, as given.

## 2010-08-01 NOTE — Progress Notes (Signed)
Summary: nuc pre-procedure  Phone Note Outgoing Call   Call placed by: Domenic Polite, CNMT,  July 09, 2010 12:12 PM Call placed to: Patient Reason for Call: Confirm/change Appt Summary of Call: Left message with information on Myoview Information Sheet (see scanned document for details).       Nuclear Med Background Indications for Stress Test: Evaluation for Ischemia, Stent Patency, PTCA Patency   History: Angioplasty, Echo, Heart Catheterization, Myocardial Infarction, Myocardial Perfusion Study, Stents  History Comments: 4/07 Stent LAD,PTCA DX2 10/07 AWMI>cardiac arrest(with hypoxic brain injury post MI)>Cath: total occlusion LAD/stent thrombus>stent LAD,residual LAD/CFX;h/o ICM 11/09 Echo: EF=65% 07/23/09 MPS EF=57% scarapex,lateral,anterior w/ mod peri-infarct ischemia 6/10 Heart Cath: EF=45% Hx. ICM, seizure disorder,hypoxic brain injury after cardiac arrest.  Symptoms: Chest Pain, Chest Pressure, DOE  Symptoms Comments: .   Nuclear Pre-Procedure Cardiac Risk Factors: Family History - CAD, Hypertension, Lipids, Obesity Height (in): 73  Nuclear Med Study Referring MD:  Valera Castle

## 2010-08-01 NOTE — Assessment & Plan Note (Signed)
Summary: Cardiology Nuclear Testing  Nuclear Med Background Indications for Stress Test: Evaluation for Ischemia, Stent Patency, PTCA Patency   History: Angioplasty, Echo, Heart Catheterization, Myocardial Infarction, Myocardial Perfusion Study, Stents  History Comments: 4/07 Stent LAD,PTCA DX2 10/07 AWMI>cardiac arrest(with hypoxic brain injury post MI)>Cath: total occlusion LAD/stent thrombus>stent LAD,residual LAD/CFX;h/o ICM 11/09 Echo: EF=65% 07/23/09 MPS EF=57% scarapex,lateral,anterior w/ mod peri-infarct ischemia 6/10 Heart Cath: EF=45% Hx. ICM, seizure disorder,hypoxic brain injury after cardiac arrest.  Symptoms: Chest Pain, Chest Pressure, DOE, Fatigue  Symptoms Comments: .Last episode of CP- Sunday evening.   Nuclear Pre-Procedure Cardiac Risk Factors: Family History - CAD, Hypertension, Lipids, Obesity Caffeine/Decaff Intake: None NPO After: 6:30 PM Lungs: Clear IV 0.9% NS with Angio Cath: 22g     IV Site: R Hand IV Started by: Bonnita Levan, RN Chest Size (in) 46     Height (in): 73 Weight (lb): 242 BMI: 32.04  Nuclear Med Study 1 or 2 day study:  1 day     Stress Test Type:  Lexiscan Reading MD:  Cassell Clement, MD     Referring MD:  Valera Castle Resting Radionuclide:  Technetium 46m Tetrofosmin     Resting Radionuclide Dose:  11 mCi  Stress Radionuclide:  Technetium 50m Tetrofosmin     Stress Radionuclide Dose:  33 mCi   Stress Protocol      Max HR:  71 bpm     Predicted Max HR:  171 bpm  Max Systolic BP: 129 mm Hg     Percent Max HR:  41.52 %Rate Pressure Product:  9159  Lexiscan: 0.4 mg   Stress Test Technologist:  Bonnita Levan, RN     Nuclear Technologist:  Domenic Polite, CNMT  Rest Procedure  Myocardial perfusion imaging was performed at rest 45 minutes following the intravenous administration of Technetium 69m Tetrofosmin.  Stress Procedure  The patient received IV Lexiscan 0.4 mg over 15-seconds.  Technetium 93m Tetrofosmin injected at  30-seconds.  There were no significant changes with infusion.  Quantitative spect images were obtained after a 45 minute delay.  QPS Raw Data Images:  Normal; no motion artifact; normal heart/lung ratio. Stress Images:  Decreased uptake in inferolateral and apical segments Rest Images:  Decreased uptake in inferolateral and apical segments. Subtraction (SDS):  Old inferolateral and apical scar with mild peri-infarct ischemia. Transient Ischemic Dilatation:  0.97  (Normal <1.22)  Lung/Heart Ratio:  0.14  (Normal <0.45)  Quantitative Gated Spect Images QGS EDV:  135 ml QGS ESV:  60 ml QGS EF:  56 % QGS cine images:  Mild apical hypokinesls  Findings Low risk nuclear study      Overall Impression  Exercise Capacity: Lexiscan with no exercise. BP Response: Normal blood pressure response. Clinical Symptoms: No chest pain ECG Impression: No significant ST segment change suggestive of ischemia. Overall Impression: Old inferolateral and apical scar with minimal peri-infarct ischemia.  Mild apical hypokinesis.  Appended Document: Cardiology Nuclear Testing good news, stable. no change in treatment  Appended Document: Cardiology Nuclear Testing Wife is aware of results. Mylo Red RN

## 2010-10-07 LAB — LIPID PANEL
HDL: 29 mg/dL — ABNORMAL LOW (ref >39)
LDL Cholesterol: 83 mg/dL (ref 0–99)
Total CHOL/HDL Ratio: 5.1 RATIO
Triglycerides: 185 mg/dL — ABNORMAL HIGH (ref <150)
VLDL: 37 mg/dL (ref 0–40)

## 2010-10-07 LAB — POCT CARDIAC MARKERS
CKMB, poc: <1 ng/mL — ABNORMAL LOW (ref 1.0–8.0)
Myoglobin, poc: 55.9 ng/mL (ref 12–200)
Troponin i, poc: <0.05 ng/mL (ref 0.00–0.09)
Troponin i, poc: <0.05 ng/mL (ref 0.00–0.09)

## 2010-10-07 LAB — BASIC METABOLIC PANEL
BUN: 11 mg/dL (ref 6–23)
BUN: 11 mg/dL (ref 6–23)
Calcium: 9.1 mg/dL (ref 8.4–10.5)
Creatinine, Ser: 1.11 mg/dL (ref 0.4–1.5)
GFR calc non Af Amer: >60 mL/min (ref >60)
GFR calc non Af Amer: >60 mL/min (ref >60)
Potassium: 3.7 mEq/L (ref 3.5–5.1)
Potassium: 4.2 mEq/L (ref 3.5–5.1)

## 2010-10-07 LAB — DIFFERENTIAL
Neutro Abs: 2.6 10*3/uL (ref 1.7–7.7)
Neutrophils Relative %: 52 % (ref 43–77)

## 2010-10-07 LAB — TSH: TSH: 2.284 u[IU]/mL (ref 0.350–4.500)

## 2010-10-07 LAB — CK TOTAL AND CKMB (NOT AT ARMC)
Relative Index: 0.8 (ref 0.0–2.5)
Total CK: 121 U/L (ref 7–232)

## 2010-10-07 LAB — HEPARIN LEVEL (UNFRACTIONATED)
Heparin Unfractionated: 0.44 IU/mL (ref 0.30–0.70)
Heparin Unfractionated: 0.65 IU/mL (ref 0.30–0.70)
Heparin Unfractionated: 0.84 IU/mL — ABNORMAL HIGH (ref 0.30–0.70)

## 2010-10-07 LAB — CBC
HCT: 37.8 % — ABNORMAL LOW (ref 39.0–52.0)
MCHC: 34.1 g/dL (ref 30.0–36.0)
Platelets: 185 10*3/uL (ref 150–400)
Platelets: 188 10*3/uL (ref 150–400)
RBC: 4.32 MIL/uL (ref 4.22–5.81)
RBC: 4.61 MIL/uL (ref 4.22–5.81)
WBC: 5.1 10*3/uL (ref 4.0–10.5)
WBC: 5.4 10*3/uL (ref 4.0–10.5)
WBC: 5.6 10*3/uL (ref 4.0–10.5)
WBC: 5.8 10*3/uL (ref 4.0–10.5)

## 2010-10-07 LAB — POCT I-STAT, CHEM 8
BUN: 9 mg/dL (ref 6–23)
Chloride: 103 mEq/L (ref 96–112)
Glucose, Bld: 111 mg/dL — ABNORMAL HIGH (ref 70–99)
HCT: 41 % (ref 39.0–52.0)
Potassium: 3.7 mEq/L (ref 3.5–5.1)
Sodium: 139 mEq/L (ref 135–145)
TCO2: 25 mmol/L (ref 0–100)

## 2010-10-07 LAB — CARDIAC PANEL(CRET KIN+CKTOT+MB+TROPI)
CK, MB: 0.9 ng/mL (ref 0.3–4.0)
CK, MB: 0.9 ng/mL (ref 0.3–4.0)
Relative Index: 0.8 (ref 0.0–2.5)
Total CK: 104 U/L (ref 7–232)
Troponin I: 0.03 ng/mL (ref 0.00–0.06)

## 2010-10-07 LAB — COMPREHENSIVE METABOLIC PANEL
Albumin: 3.4 g/dL — ABNORMAL LOW (ref 3.5–5.2)
BUN: 9 mg/dL (ref 6–23)
Creatinine, Ser: 1.04 mg/dL (ref 0.4–1.5)
Total Protein: 6 g/dL (ref 6.0–8.3)

## 2010-10-07 LAB — PROTIME-INR: INR: 1 (ref 0.00–1.49)

## 2010-10-07 LAB — APTT: aPTT: 98 seconds — ABNORMAL HIGH (ref 24–37)

## 2010-11-12 NOTE — Assessment & Plan Note (Signed)
[facility]                            CARDIOLOGY OFFICE NOTE   RONZELL, LABAN                       MRN:          [id number]  DATE:04/21/2008                            DOB:          10-16-1960    [name] Cummings comes in today for followup.  He has been plagued by dry  persistent cough.  It is probably the  enalapril.  His wife is willing  to go through efforts to get him an ARB.  He meets criteria for some  prescription help.  The free clinic, otherwise has provided lot of his  meds.  Most of them are generic.   He denies any ischemic symptoms.  He has really had no orthopnea, PND,  or peripheral edema.   MEDICATIONS:  1. Enalapril 2.5 mg a day.  2. Omeprazole 40 mg daily.  3. Aldactone 25 mg a day.  4. Enteric-coated aspirin 325 a day.  5. Plavix 75 mg a day.  6. Coreg 25 b.i.d.  7. Crestor 10 mg a day.  8. Aricept 10 mg a day.  9. Lexapro 10 mg a day.  10.Doxycycline 100 mg p.o. b.i.d..   PHYSICAL EXAMINATION:  VITAL SIGNS:  Blood pressure today is 99/68, his  pulse is 53 and regular in no acute distress.  His weight is 236 and  stable.  HEENT:  Normal.  Carotids are equal bilaterally without bruits.  No JVD.  Thyroid is not enlarged.  Trachea is midline.  LUNGS:  Clear.  HEART:  Reveals a nondisplaced PMI, he has an inferior lateral displaced  PMI.  No obvious gallop.  ABDOMEN:  Soft, good bowel sounds.  No midline bruit.  EXTREMITIES:  No cyanosis, clubbing, or edema.  Pulses are intact.  NEURO:  Baseline.  He has very pleasant demeanor.  His recent memory has  improved maybe a little.   His EKG shows marked sinus bradycardia, low voltage.   ASSESSMENT/PLAN:  1. ACE inhibitor induced cough.  2. Stable coronary disease.  3. Stable chronic systolic heart failure.   PLAN:  1. Change enalapril to Diovan 40 mg p.o. b.i.d.  2. Check CMP, lipids, and a CBC today.  3. See me back again in 6 months.     Thomas C. Daleen Squibb, MD, [facility]  Electronically Signed    TCW/MedQ  DD: 04/21/2008  DT: 04/21/2008  Job #: [id number]

## 2010-11-12 NOTE — Assessment & Plan Note (Signed)
[facility]                            CARDIOLOGY OFFICE NOTE   TAMARIO, HEAL                       MRN:          [id number]  DATE:04/08/2007                            DOB:          Jul 09, 1960    [name] returns today for further management of the following issues.   Coronary artery disease, status post large anterior wall myocardial  infarction.   His course was complicated by ventricular fibrillation arrest and  subsequent hypoxic brain injury.  He still has very poor recent recall.   His wife continues to stay with him, 24/7.   He was recently seen at the free clinic in [place], where blood work  was checked.   He offers no complaints today.  He does have some sharp, stabbing pains  at times and some fatigue and some shortness of breath when he really  pushes himself.   MEDICATIONS:  1. Enalapril 2.5 mg a day.  2. Rozerem 10 mg a day.  3. Lexapro 10 mg a day.  4. Oxybutynin 2.5 mg b.i.d.  5. Omeprazole 20 mg b.i.d.  6. Aricept 10 mg daily.  7. Senokot b.i.d.  8. Crestor 10 mg daily.  9. Coreg 25 mg p.o. b.i.d.  10.Spironolactone 25 mg a day.  11.Plavix 75 mg a day.  12.Aspirin 325 a day.   He has been advised to take Plavix indefinitely.   His blood pressure today is 117/59, his pulse is 53 and regular.  EKG shows old changes from his anterior wall infarct with nonspecific ST  elevation inferiorly.  His weight is 227.  He is in no acute distress.  HEENT:  Unchanged.  Carotid upstrokes are equal bilaterally, without bruits, no JVD.  Thyroid is not enlarged.  Trachea is midline.  LUNGS:  Clear.  HEART:  Reveals no S3 gallop.  ABDOMINAL EXAM:  Soft, good bowel sounds.  EXTREMITIES:  Reveal no edema.  Pulses are intact.   [name] continues to be stable from a cardiovascular standpoint.  I  have made no changes in his medical program.  We will see him back in  the free clinic, where I volunteer.     Thomas C.  Daleen Squibb, MD, [facility]  Electronically Signed    TCW/MedQ  DD: 04/08/2007  DT: 04/09/2007  Job #: [id number]

## 2010-11-12 NOTE — Discharge Summary (Signed)
NAMESAVON, BORDONARO                ACCOUNT NO.:  [id number]   MEDICAL RECORD NO.:  [id number]          PATIENT TYPE:  INP   LOCATION:  2501                         FACILITY:  [facility]   PHYSICIAN:  Bevelyn Buckles. Bensimhon, MDDATE OF BIRTH:  [birth year]   DATE OF ADMISSION:  12/01/2008  DATE OF DISCHARGE:  12/05/2008                               DISCHARGE SUMMARY   PROCEDURES:  1. Cardiac catheterization.  2. Coronary arteriogram.  3. Left ventriculogram.  4. Percutaneous transluminal coronary angioplasty and drug-eluting      stent to the left anterior descending.   PRIMARY FINAL DISCHARGE DIAGNOSES:  1. Unstable anginal pain with percutaneous intervention to the left      anterior descending this admission.  2. Dyslipidemia with a total cholesterol of 149, triglycerides 185,      HDL 29, and LDL 83 this admission.  3. Mild hyperglycemia with fasting blood sugars between 106 and 108      this admission.  4. History of acute anterior wall myocardial infarction in October      2007 complicated by ventricular fibrillation arrest and anoxic      brain injury, requiring tracheostomy, percutaneous endoscopy, and      gastrostomy tube, all removed.  5. Ischemic cardiomyopathy with an ejection fraction of 45% by cath      this admission.  6. Intolerance to LIPITOR, HYDROMORPHONE, CIPRO, ATIVAN,      PSEUDOEPHEDRINE, TERFENADINE, and ACE INHIBITORS with cough.  7. History of seizure disorder.  8. History of renal insufficiency with BUN 11, creatinine 1.11, and      GFR greater than 60 this admission.  9. History of methicillin-sensitive Staphylococcus aureus pneumonia in      October 2007.  10.History of asthma.  11.Anxiety/depression.  12.Family history of coronary artery disease in both parents.   TIME AT DISCHARGE:  39 minutes.   HOSPITAL COURSE:  Mr. Fredin is a 50 year old male with a history of  coronary artery disease.  He was evaluated by Dr. Daleen Squibb, and had a  Myoview showing some  peri-infarct ischemia.  He was scheduled for  outpatient catheterization, but had recurrent chest pain, and was  admitted for further evaluation.   His cardiac enzymes were negative.  A cardiac catheterization performed  on December 04, 2008, showed an apparent ruptured plaque, which was estimated  to be 70-80% obstruction in the LAD.  This was treated with PTCA and a  drug-eluting stent.  He had other lesions including 70-80% narrowing in  the proximal ramus intermedius and a 40% narrowing in the circumflex  artery, 50% in the ramus branch, and 70-80% in the proximal non-dominant  RCA, to be treated medically.   On December 05, 2008, Mr. Goyer was doing well.  He was ambulating without  chest pain or shortness of breath.  He was seen by Cardiac Rehab and  will follow up with them as an outpatient.  He was evaluated by Dr.  Gala Romney and considered stable for discharge with close outpatient  followup.   DISCHARGE INSTRUCTIONS:  1. His activity level is to be increased gradually with  no lifting for      2 weeks.  2. He is to stick to a low-sodium heart-healthy diet.  3. He is to call our office for problems with cath site.  4. He is to follow up with Dr. Vern Claude PA on June 22 at 1:45 and with      Dr. Herb Grays as needed.   DISCHARGE MEDICATIONS:  1. Plavix 75 mg daily.  2. Coreg 25 mg b.i.d.  3. Aricept 10 mg a day.  4. Prozac 20 mg a day.  5. Crestor 20 mg a day.  6. Omeprazole 20 mg a day.  7. Aspirin 325 mg daily.  8. Lasix 20 mg a day.  9. Viagra p.r.n.  10.Nitroglycerin sublingual p.r.n.      Theodore Demark, PA-C      Bevelyn Buckles. Bensimhon, MD  Electronically Signed    RB/MEDQ  D:  12/05/2008  T:  12/06/2008  Job:  [id number]   cc:   Tammy R. Collins [place], M.D.

## 2010-11-12 NOTE — Cardiovascular Report (Signed)
[name] Cummings, [name] Cummings                ACCOUNT NO.:  [id number]   MEDICAL RECORD NO.:  [id number]          PATIENT TYPE:  INP   LOCATION:  2501                         FACILITY:  [facility]   PHYSICIAN:  [name] R. Juanda Chance, MD, FACCDATE OF BIRTH:  1960-08-24   DATE OF PROCEDURE:  12/04/2008  DATE OF DISCHARGE:                            CARDIAC CATHETERIZATION   CLINICAL HISTORY:  [name] Cummings is 50 year old and has known coronary  artery disease.  He had a bare-metal stent placed in the LAD in April  2007.  He had stent thrombosis in October 2007 complicated by a VFib  arrest and anoxic brain injury.  He had a new TAXUS stent placed at that  time.  He was done well since that time except his mental status, he was  abnormal.  He recently had chest pain, although the history is quite  difficult.  He had a Myoview scan, which showed anterior scar with some  apical ischemia.  He was scheduled for an outpatient catheterization,  but came in early with unstable angina.   PROCEDURE:  The procedure was performed via the right femoral artery and  arterial sheath and 5-French preformed coronary catheters.  A front Cummings  arterial puncture was performed and Omnipaque contrast was used.  After  completion of the diagnostic study, we made a decision to proceed with  intervention on the lesion in the mid LAD distal to the previously  placed stents.   The patient was given Angiomax bolus infusion.  He was given additional  300 mg of Plavix.  He had been previously given chewable aspirin.  We  used an [place] guiding catheter with side holes, 6-French.  We passed a  marker wire down the LAD across the lesion without difficulty.  Based on  the length from the markers on the marker wire, we chose a 2.5 x 18-mm  XIENCE stent.  We placed this in the mid LAD just overlapping the  previously placed stent.  We deployed this with one inflation of 9  atmospheres for 30 seconds.  We then postdilated with a 2.75 x 15-mm  [place]  apex performing two inflation of 20 atmospheres for 30 seconds.  Final  diagnostic studies were then performed through the guiding catheter.  The patient tolerated the procedure well and left the laboratory in  satisfactory condition.   RESULTS:  Left main coronary artery:  The left main coronary artery was  free of significant disease.   Left anterior descending:  The left anterior descending artery gave rise  to small diagonal branch and small septal perforator, and larger  diagonal branch.  There was 30% narrowing within the midportion of the  previously placed stents.  Distal to the stent, there was an eccentric  lesion, which appeared to be a ruptured plaque, which was estimated to  be 70-80% obstructive.  There also was 40-50% diffuse narrowing in the  proximal LAD.   Circumflex artery:  The circumflex artery gave rise to a large ramus  branch and two posterolateral branches and a posterior descending  branch.  This was a dominant vessel.  There was 70-80% narrowing in the  proximal portion in the ramus branch which was extended over about 18  mm.  There was also a 40% ostial narrowing in the circumflex artery and  50% narrowing in the ostium of the ramus branch.   Right coronary artery:  The right coronary artery was a nondominant  vessel which supplied only right ventricular branches.  There was a 70-  80% proximal stenosis.   Left ventriculogram:  The left ventriculogram was performed in the RAO  projection showed hypokinesis of the anterolateral Cummings and apex.  The  estimated fraction was 45%.   Following stenting of the lesion in the mid LAD, the stenosis improved  from 80% to 0%.   CONCLUSIONS:  1. Coronary artery disease, status post previous percutaneous coronary      interventions as described above with 40% narrowing in the proximal      LAD, 30% narrowing within the stent in the mid LAD, and 80%      narrowing in the mid LAD after the stent, 70-80%  narrowing in the      large ramus branch of the circumflex artery with 40% ostial      stenosis in the dominant circumflex artery, and 80% narrowing in      the proximal portion of a nondominant right coronary artery with      anterolateral Cummings and apical Cummings hypokinesis and estimated      ejection fraction of 45%.  2. Successful PCI of the lesion in the mid LAD distal to the      previously placed stent using a XIENCE drug-eluting stent with      improvement in center narrowing from 80% to 0%.   DISPOSITION:  The patient returned to [facility] room for further  observation.      [name] Elvera Lennox Juanda Chance, MD, [facility]  Electronically Signed     BRB/MEDQ  D:  12/04/2008  T:  12/04/2008  Job:  [id number]   cc:   [name] C. Wall, MD, [facility]  [name] Cummings, M.D.

## 2010-11-12 NOTE — H&P (Signed)
[name], [name] Cummings                ACCOUNT NO.:  [id number]   MEDICAL RECORD NO.:  [id number]          PATIENT TYPE:  INP   LOCATION:  3707                         FACILITY:  [facility]   PHYSICIAN:  Lanell Matar, DO      DATE OF BIRTH:  March 09, 1961   DATE OF ADMISSION:  06/16/2007  DATE OF DISCHARGE:                              HISTORY & PHYSICAL   CHIEF COMPLAINT:  Chest pain.   HISTORY OF PRESENT ILLNESS:  The patient is a 50 year old male with a  long past cardiac history summarized below.  Due to his prior anoxic  brain injury, he is a difficult historian.  A large proportion of the  history is obtained from the patient's wife who is his constant  companion.  At approximately 8:15 on the evening of June 16, 2007,  the patient began to report a deep severe pain in his midsternal chest.  This was associated with dyspnea.  In addition, the patient's wife  thought that the patient appeared sweaty across his brow and his hands  felt clammy to her.  The patient appeared to be markedly anxious during  the pain.  The patient was given nitroglycerin sublingual on two  occasions.  The pain seemed to resolve over about 30 minutes.  However,  after the pain subsided, the patient's hands reportedly both felt numb.  The patient requested to be taken to the hospital to be sure he was not  having another heart attack.   At the time of my evaluation, the patient denies having any chest pain  or other symptoms consistent with angina.  The numbness and tingling in  his hands has abated and he has no other symptoms at the present time.  The patient has no recollection of any of his symptoms over the course  of the evening.   PAST MEDICAL HISTORY:   PAST SURGICAL HISTORY:  1. Coronary artery disease.  2. Cardiac catheterization with stenting October 21, 2005, with bare      metal stent to the mid LAD (Minivision 2.5 x 18 mm) with      Provisional balloon angioplasty to the second diagonal.  3.  Anterior myocardial infarction with stent thrombosis on March 30, 2006, complicated by sudden cardiac death.  4. Ventricular fibrillation cardiac arrest.  5. 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  6. Cardiac catheterization on March 31, 2006, with stenting of the      mid LAD with Taxus 2.5 x 20 mm and 2.5 x 8 mm stents.  7. Hypertension.  8. Hyperlipidemia.  9. Ischemic cardiomyopathy with baseline ejection fraction      approximately 35%.  10.Urolithiasis.  11.History of seizure disorder.  12.Dysphagia.  13.Depression.  14.Anxiety.   SOCIAL HISTORY:  The patient lives in [place] at home with his wife  who helps to care for him.  There is no history of smoking, alcohol, or  drug use.   FAMILY HISTORY:  The patient's mother began having myocardial  infarctions in her mid to early  73's.  The patient's father had a  myocardial infarction at approximately age 19.  None of the patient's  siblings have premature coronary artery disease.   ALLERGIES:  LIPITOR causes myalgias, SELDANE, PSEUDOEPHEDRINE.   MEDICATIONS:  1. Aspirin 325 mg daily.  2. Plavix 75 mg daily.  3. Enalapril 2.5 mg daily.  4. Coreg 25 mg daily.  5. Crestor 10 mg daily.  6. Spironolactone 25 mg daily.  7. Aricept 10 mg daily.  8. Senokot twice a day.  9. Trazodone dose unknown taken p.r.n.  10.Ancef.  11.The patient has recently stopped Rozerem, Lexapro, and Oxybutynin.   REVIEW OF SYSTEMS:  The patient complains of chest pain not uncommonly,  however, he has not been complaining very much about this over the past  few weeks until today.  The patient's neurological problems have been  worsening lately.  He has had some personality changes and occasional  outbursts and threatening behavior.  He has also had worsening of his  dysphagia lately.  The patient's urinary incontinence has been worsening  recently as well.  The patient has recently been  complaining of pain in  his left lower abdomen associated with increase in bowel symptoms.   Full 14-point review of systems was obtained from the patient's wife and  is otherwise negative except as above.   PHYSICAL EXAMINATION:  VITAL SIGNS:  Pulse 68, blood pressure 97/51,  respiratory rate 14, oxygen saturation 97% on 2 liters of oxygen by  nasal cannula.  GENERAL:  This is a well-developed, well-nourished, well-appearing  Caucasian male who is in no apparent distress at the present time.  HEENT:  The patient is normocephalic and there are no signs of trauma  about the head or face.  Pupils equal, round, and reactive to light and  accommodation.  Extraocular muscles are intact.  Mucous membranes are  moist.  NECK:  Supple with no adenopathy, thyromegaly, or masses.  Jugular  venous pressure is minimally elevated approximately 7 cm.  There is no  jugular venous distention.  HEART:  The PMI is grossly normal.  Heart sounds are regular with a mild  sinus arrhythmia of approximately 70 beats per minute.  There is a  normal S1 and S2.  I do not detect an S3 or S4.  There are no murmurs,  rubs, or gallops evident.  Distal pulses are 2+ in all extremities.  LUNGS:  Clear to auscultation in all fields without rales, rhonchi, or  wheezing.  Percussion note is normal throughout.  ABDOMEN:  Soft, nontender, and nondistended.  There are normal bowel  sounds.  There are no bruits.  I do not detect any masses or  organomegaly.  EXTREMITIES:  Warm and dry.  No cyanosis or clubbing.  There is trace of  angle edema.  NEUROLOGY:  The patient is awake, alert and oriented to person and time.  However, he needed to be reminded of our current location.  Otherwise,  his cranial nerves appear to be intact.  His strength appears to be  intact in all extremities.  He has no loss of sensation to light touch  throughout.   Diagnostic studies and electrocardiogram were personally reviewed by me  and  revealed sinus rhythm with a rate of 58 beats per minute.  There is  normal QRS axis.  There are normal electrocardiographic intervals.  There is a late QRS transition and nonspecific T wave changes.  In  comparison to the electrocardiogram dated August 20, 2006, there  is no  significant change.   LABORATORY DATA:  White blood cell count 6.8, hemoglobin 12.6, platelets  269.  Sodium 137, potassium 3.8, chloride 103, CO2 29, BUN 9, creatinine  1.1, glucose 97.  Pro-time 12.5, INR 0.9, PTT 29.   Cardiac enzymes set #1; CK-MB less than 1.0, troponin I less than 0.05,  myoglobin 58.  Set #2; CK-MB less than 1.0, troponin I less than 0.05,  myoglobin 49.6.   ASSESSMENT:  This is a 50 year old gentleman with a history of severe  coronary artery disease, ischemic cardiomyopathy, and prior sudden  cardiac death with residual anoxic brain injury.  He presents with chest  pain that is somewhat atypical, but is difficult to clearly characterize  due to his brain injury.  Because of the difficult history and any  interest of the most cautious approach, I think it is prudent to admit  the patient for a period of observation and additional set of cardiac  enzymes.   1. Chest pain.  We will rule out an acute coronary syndrome with      continued observation, serial cardiac enzymes, and      electrocardiograms.  2. We will continue the patient's home medications which include      aspirin, Plavix, Coreg, enalapril, Crestor, and spironolactone.  3. The patient will be seen by his primary cardiologist in the morning      for further evaluation.  At that time it will be decided whether      stress testing should be pursued or not.  4. Hyperlipidemia.  We will check a fasting lipid profile in the      morning and adjust the patient's Crestor dosing if needed.      Lanell Matar, DO  Electronically Signed     KRD/MEDQ  D:  06/17/2007  T:  06/17/2007  Job:  [id number]   cc:   Ernestina Penna,  M.D.

## 2010-11-12 NOTE — Assessment & Plan Note (Signed)
[facility]                            CARDIOLOGY OFFICE NOTE   ARYAAN, PERSICHETTI                       MRN:          [id number]  DATE:08/02/2007                            DOB:          1960/09/18    Mr. Bomkamp returns today for further management of his coronary disease.  He has chest pain off-and-on, but had a negative stress Myoview with  normal left ventricular function during his December 2008 visit.  With  his nontoxic brain injury he cuts up all the time according his wife.   He offers no complaints today.   MEDICATIONS:  His meds are unchanged since his last visit--please refer  to the maintenance medication list.   PHYSICAL EXAMINATION:  His blood pressure today is 118/68; his pulse is  60 and regular.  HEENT:  Unchanged.  Carotid upstrokes are equal bilaterally without  bruits, no JVD.  Thyroid is not enlarged.  Trachea is midline.  LUNGS:  Clear.  HEART:  Reveals regular rate and rhythm without gallop.  ABDOMINAL EXAM:  Soft.  Bowel sounds are present.  EXTREMITIES:  Reveal no edema.  Pulses are present.   ASSESSMENT:  Mr. Roker is doing well.  His wife would like him to have  some Viagra p.r.n..  I think that that is safe with his blood pressure  under control, and him having a negative Myoview recently in December.  I have prescribed Viagra 50 mg p.r.n..  I warned about nitroglycerin for  24 hours after taking this drug.   We will plan on seeing him back again in 6 months.     Thomas C. Daleen Squibb, MD, [facility]  Electronically Signed    TCW/MedQ  DD: 08/02/2007  DT: 08/03/2007  Job #: [id number]

## 2010-11-12 NOTE — H&P (Signed)
NAMERAIFE, LIZER                ACCOUNT NO.:  [id number]   MEDICAL RECORD NO.:  [id number]          PATIENT TYPE:  INP   LOCATION:  4708                         FACILITY:  [facility]   PHYSICIAN:  Thomas C. Wall, MD, FACCDATE OF BIRTH:  10-09-1960   DATE OF ADMISSION:  12/01/2008  DATE OF DISCHARGE:                              HISTORY & PHYSICAL   CHIEF COMPLAINT:  Chest pain and shortness of breath.   HISTORY OF PRESENT ILLNESS:  [name] Cummings is a 50 year old married white  male, patient of mine who has been having increasing chest pain and  shortness of breath.  He is very unreliable and difficult historian  because of her previous anoxic brain injury.   He had a recent outpatient Myoview at our office on [address] which  showed anterior septal scar with some peri-infarct ischemia.  I do not  have an official report at this time.  It was arranged for him to have  outpatient cardiac catheterization next week.  Because of increasing  pain this afternoon and possible relief with nitroglycerin per his wife,  he is brought to the emergency room.  We will admit him for observation  and cath on Monday.   PAST MEDICAL HISTORY:  History of coronary artery disease, status post  bare metal stenting of the LAD in April 2007.  In October 2007, he had  an acute anterior wall infarct with subacute thrombosis of the LAD  complicated by V-fib arrest and anoxic brain injury.   He has a history of a tracheostomy, percutaneous endoscopy, gastrostomy  tube at that time.  It has been removed.  He has a history of a seizure  disorder from his anoxic injury, ischemic cardiomyopathy of 30-35%,  renal insufficiency chronic stable, systolic heart failure, history of  methicillin-resistant Staph pneumonia, hypertension, and hyperlipidemia.   SOCIAL HISTORY:  He lives with his wife and his family.  He is totally  dependent upon his wife who takes immaculate care of him.  He gets a lot  of his  medications through [facility].   He has no known drug allergies.   CURRENT MEDICATIONS:  Include, enalapril 2.5 mg a day, omeprazole 40 mg  a day, Aldactone 25 mg a day, enteric-coated aspirin 325 mg a day,  Plavix 75 mg a day, Coreg 25 mg p.o. b.i.d., Crestor 10 mg a day,  Aricept 10 mg a day, Lexapro 10 mg a day, doxycycline p.r.n.   FAMILY HISTORY:  Noncontributory.   REVIEW OF SYSTEMS:  Other than HPI is negative.  Again, he is a very  difficult historian.  His wife has trouble understanding what is  happening to him.   PHYSICAL EXAMINATION:  GENERAL:  He is in the emergency room in no acute  distress.  VITAL SIGNS:  Blood pressure is 110/80.  His pulse is 72 and regular.  His EKG shows sinus rhythm with an old anterior septal infarct with no  acute changes.  Respiratory rate is 18, unlabored, sats are 97%.  HEENT: Marked for poor dentition and facial hair.  NECK:  Supple.  Carotid upstrokes are equal bilaterally without bruits.  No JVD.  Thyroid is not enlarged.  Trachea is midline.  SKIN:  Warm and dry.  PSYCHIATRIC:  He has his baseline mental status with very nonexistent  immediate recall or memory.  He is very pleasant as usual.  He says he  is hungry.  LUNGS:  Clear to auscultation and percussion.  HEART:  Nondisplaced PMI.  Normal S1 and S2.  No gallop, rub, or murmur.  ABDOMEN:  Soft, good bowel sounds.  No midline bruit.  No organomegaly.  EXTREMITIES:  No cyanosis, clubbing, or edema.  Pulses are intact.  NEURO:  Baseline.  He is nonfocal.   LABORATORY DATA:  Pending.   ASSESSMENT:  Chest discomfort and increased shortness of breath with  recent positive Myoview, now with rest symptoms and possible relief with  nitroglycerin.  We have to presume he is having unstable angina.   PLAN:  1. Admitted to telemetry.  2. Check cardiac markers.  3. Baseline blood work.  4. Cardiac cath on Monday.   We will go ahead and start him on intravenous heparin and  continue his  other medications.   ADDENDUM:  Stress Myoview results had been obtained and shows scar at  the apex, distal lateral wall and distal anterior wall.  There is  moderate peri-infarct ischemia.  EF was calculated at 63%.      Thomas C. Daleen Squibb, MD, [facility]  Electronically Signed     TCW/MEDQ  D:  12/01/2008  T:  12/02/2008  Job:  [id number]   cc:   Tammy R. Collins [place], M.D.

## 2010-11-12 NOTE — Assessment & Plan Note (Signed)
[name] Cummings is back regarding his anoxic brain injury and multiple complaints.  He continues to have some coughing despite coming off the Diovan.  He  has not seen Dr. Daleen Squibb in followup yet.  He is scheduled to see him soon.  He has gone on the Tegretol, but wife notes increased agitation since  being on the medication.  For some reason, she has not called me to tell  me that.  He continues with Aricept for memory, but continues to have  significant short-term memory deficits.  The patient denies pain today.   REVIEW OF SYSTEMS:  The patient reports loss of taste, anxiety,  occasional vomiting, but otherwise stable.  Full review is in the  written health and history section.  Other pertinent positives are  above.   SOCIAL HISTORY:  The patient is married, living with his wife, who  provides supervision for the patient.   PHYSICAL EXAMINATION:  VITAL SIGNS:  Blood pressure is 107/71, pulse is  54, respiratory rate 18, and he is sating 97% on room air.  GENERAL:  The patient continues to be alert and oriented to name.  He is  impulsive and can be easily excited.  HEART:  Bradycardic.  CHEST:  Clear.  ABDOMEN:  Soft, nontender.  MUSCULOSKELETAL AND NEURO:  He has good strength of 5/5 and extremely  strong grip today.  Memory remains severely impaired.  He remembers 0/3  objects after 5 minutes.  He has poor insight, awareness, decision  making etc.  He likes to get around a lot.   ASSESSMENT:  1. Severe anoxic brain injury.  2. History of severe coronary artery disease with cardiomyopathy.  3. Urinary frequency.  4. Persistent semi-dry cough.  5. Bilateral rotator cuff syndrome.   PLAN:  1. Considering the fact that he is continuing to cough at this point,      I think it is worthwhile having [place] to look at a modified barium      swallow to see if he has some trace aspiration going on and      triggering a cough.  It does not appear to be a medication cause at      this point.  Also I  have to consider reflux.  We will schedule the      patient for modified barium swallow over the next week or two.      Also, we will resume Prilosec at 40 mg p.o. daily.  2. We will stop Tegretol at this point and get him on a trial of      Klonopin 0.5 q.12 h. scheduled.  Observe for effects.  I asked wife      to call us if he has any further problems here.  3. Stay with Aricept for now.  4. I will see the patient back in about 2 months' time.      Ranelle Oyster, M.D.  Electronically Signed     ZTS/MedQ  D:  09/15/2008 13:35:08  T:  09/16/2008 03:00:13  Job #:  [id number]   cc:   Thomas C. Wall, MD, FACC  [address]  [address]  [place]  [place] [address]

## 2010-11-12 NOTE — Discharge Summary (Signed)
NAMETREVONN, HALLUM                ACCOUNT NO.:  [id number]   MEDICAL RECORD NO.:  [id number]          PATIENT TYPE:  INP   LOCATION:  3714                         FACILITY:  [facility]   PHYSICIAN:  Thomas C. Wall, MD, FACCDATE OF BIRTH:  07/10/1960   DATE OF ADMISSION:  06/16/2007  DATE OF DISCHARGE:  06/18/2007                               DISCHARGE SUMMARY   PROCEDURES:  Adenosine Myoview.   PRIMARY FINAL DISCHARGE DIAGNOSES:  Chest pain, cardiac enzymes negative  for myocardial infarction and minimal ischemia on Myoview, medical  therapy recommended.   SECONDARY DIAGNOSES:  1. Status post percutaneous transluminal coronary angioplasty and bare      metal stent to the left anterior descending with percutaneous      transluminal coronary angioplasty to the diagonal in April of 2007.  2. V-fib arrest requiring multiple shocks to restore sinus rhythm, non-      ST elevation myocardial infarction and associated cardiogenic shock      and ventilator dependent respiratory failure, drug eluting stent x2      to the left anterior descending, intra-aortic balloon pump also      required.  3. Residual coronary artery disease of 80% in the obtuse marginal.  4. History of ischemic cardiomyopathy with an ejection fraction of 30%      at cath in October of 2007, ejection fraction 50% by Myoview this      admission.  5. Anoxic brain injury after non-ST segment elevation myocardial      infarction in October of 2007.  6. Dysphagia requiring a percutaneous endoscopic gastrostomy tube      secondary to brain injury.  7. History of seizure disorder.  8. Renal insufficiency.  9. History of systolic congestive heart failure when his ejection      fraction was low.  10.Methicillin sensitive Staphylococcus aureus pneumonia during his      admission in October 2007.  11.Prolonged ventilator support in October of 2007 requiring      tracheostomy.  12.History of asthma.  13.Nephrolithiasis.  14.Anxiety and depression.  15.Allergy or intolerance to ATIVAN, CIPRO, LIPITOR, DILAUDID,      SUDAFED, SELDANE.  16.Family history of coronary artery disease in both parents.   Time of discharge 48 minutes.   HOSPITAL COURSE:  Mr. Hurtado is a 50 year old male with significant  cardiac history.  He is a poor historian but his wife was with him when  he began complaining of substernal chest pain with dyspnea and some  diaphoresis as well as significant anxiety.  His symptoms were relieved  by nitroglycerin.  He came to the hospital and was admitted for further  evaluation and treatment.   His cardiac enzymes were negative for MI.  His other labs were without  significant abnormality although his hemoglobin was slightly low at 12.7  with a hematocrit of 37.  His blood sugar was minimally elevated at 102.  Total cholesterol 124, triglycerides 61, HDL 30, LDL 82 and a TSH was  3.607.  Chest x-ray was read as cardiomegaly but no edema and no acute  osseous findings.  Because his cardiac enzymes were negative a stress  test was considered appropriate.  His Myoview showed previous apical and  distal anterior wall infarct.  There was a question of mild overlying  ischemia in the anterior wall and his EF was 67%.  The films were  reviewed by Dr. Gala Romney and by Dr. Daleen Squibb.  Dr. Daleen Squibb felt that since his  possible ischemia was in the area of a previous infarct and because his  symptoms had resolved and his EF was much improved no further inpatient  workup was indicated at this time.  If symptoms do not resolve  consideration can be given to cardiac catheterization at that time.  Mr.  Borthwick was therefore considered stable for discharge on June 18, 2007, with outpatient followup arranged.   DISCHARGE INSTRUCTIONS:  His activity level is to be increased  gradually.  He is to stick to a low-sodium heart-healthy diet.  He is to  follow up with Dr. Daleen Squibb on January 20 at 3:00 p.m. and he is to  call Dr.  Ludwig Clarks as well.   DISCHARGE MEDICATIONS:  1. Aspirin 325 mg daily.  2. Coreg 25 mg b.i.d.  3. Vasotec 2.5 mg daily.  4. Aricept 10 mg a day.  5. Prilosec 20 mg daily.  6. Crestor 10 mg daily.  7. Senokot b.i.d. p.r.n.  8. Lexapro 10 mg a day.  9. Aldactone 25 mg a day.  10.Plavix 75 mg day.  11.Advanced Ambrotose over-the-counter supplement daily.  12.Ambien 10 mg daily at bedtime p.r.n., prescription for 30 tablets      with no refills given.      Theodore Demark, PA-C      [place] Sans. Daleen Squibb, MD, [facility]  Electronically Signed    RB/MEDQ  D:  06/18/2007  T:  06/20/2007  Job:  [id number]   cc:   Ludwig Clarks, Dr

## 2010-11-12 NOTE — Assessment & Plan Note (Signed)
[name] Cummings is back regarding his anoxic injury.  He was unable to get the  Klonopin due to pulmonary restraints.  His cough seems to be better with  the Prilosec that was increased to 40 mg daily.  His modified barium  swallow was negative for any dysphagia.  Wife states that he continues  to have impulsive and sometimes aggressive activity.  He is off the  Tegretol per my recommendation last time, but no other medication has  been prescribed to control mood.  He is on Aricept still at night 10 mg  for memory.   REVIEW OF SYSTEMS:  Notable for bladder control issues, trouble walking,  spasms, loss of taste, shortness of breath.  Other pertinent positives  are above and full review is in the written health and history section  of the chart.   SOCIAL HISTORY:  The patient is married and wife remains very  supportive.   PHYSICAL EXAMINATION:  VITAL SIGNS:  Blood pressure is 110/74, pulse 58,  respiratory rate 80.  He is sating 97% on room air.  GENERAL:  The patient is pleasant, alert, and oriented x3.  He continues  to be impulsive.  He is coughing frequently, but less so comparatively  to her last visit.  He actually started coughing more once we began  talking about the cough as opposed to prior.  NEUROLOGIC:  Memory still  is poor as it is insight awareness, decision making problem-solving etc.  Strength is 5/5.  Balance is good.  Cranial nerve exam is normal.  HEART:  Bradycardic.  CHEST:  Clear.  ABDOMEN:  Soft, nontender.   ASSESSMENT:  1. Severe anoxic brain injury.  2. Coronary artery disease.  3. Persistent cough which appears to be at least partially related to      reflux.  4. Urinary frequency.  5. Emotional dyscontrol.   PLAN:  1. We will try low-dose trazodone 25-50 mg b.i.d.  Could consider      restarting Tegretol, although I am not sure how effective this was.      We will hold off on the benzodiazepines as the patient does not      have coverage for these.  2. We  will see him back in about 6 months' time.  I think he should do      fairly well with the trazodone.  Wife will call me if any further      problems.       Ranelle Oyster, M.D.  Electronically Signed     ZTS/MedQ  D:  11/17/2008 13:05:59  T:  11/18/2008 05:09:01  Job #:  [id number]   cc:   Thomas C. Wall, MD, FACC  [address]  [address]  [place]  [place] [address]

## 2010-11-12 NOTE — Assessment & Plan Note (Signed)
I last saw [name] Cummings about 16 months ago after his anoxic brain injury.  Apparently, his wife stated he never received a followup appointment  call as was intended in the fall.  She is back because of questions  regarding his memory, taste, and smell.  He has had persistent cough as  well.  Dr. Daleen Squibb had taken him off his enalapril and placed him on  Diovan, while although cough seems to be persistent.  His wife has cut  back his dose in half from 40 b.i.d. to 20 b.i.d.  His blood pressure  still has not been controlled and it is still quite low in fact.  He is  still using the Ditropan for nighttime incontinence, and this still is a  problem warning episodic type of nature.  It does not seem to matter  whether he is voided before going to bed or watch what he has had to  drink.  She does note foul smell to the urine, although she does not  recall urine ever coming back positive for infection.  No urine has been  checked for this for a while apparently.  He remains on Aricept 10 mg  nightly for memory.   She reports that [name] Cummings is doing a lot of independent activity around the  house.  He can dress, bathe, toilet etc.  They have not left him alone  yet at this point, but feel that he is safe to be alone for short  periods at home by himself.  He has not showed any risky behavior.  He  still has problems with memory and insight awareness, although does  better in a familiar environment.  He denies any shoulder pain.  His  back has been feeling well.  Wife does note some occasional impulsivity  and lack of social awareness.  She states, overall, anxiety is improved.   REVIEW OF SYSTEMS:  Notable for improved taste and smell.  Other  pertinent positives are above and full 14-point review is in the written  health and history section of the chart.   SOCIAL HISTORY:  The patient is married, living with his wife who is  with him today.  She states that she is going to college now.   PHYSICAL  EXAMINATION:  VITAL SIGNS:  Blood pressure is 115/56, pulse is  50, respiratory rate 18, and he is sating 97% on room air.  GENERAL:  The patient is pleasant, alert, and oriented times x1.  He  needed significant cuing for place, date, reason he was here, etc.  HEART:  Regular rhythm with bradycardic.  CHEST:  Clear.  MUSCULOSKELETAL:  He has normal movement of the shoulders and back.  He  was quite impulsive and sometimes inappropriately aggressive with his  behavior.  NEURO:  Memory is poor for short-term information.  He remembered 0/3  objects after 5 minutes today.  Insight awareness, decision making,  function, processing, etc all significantly impaired.   ASSESSMENT:  1. Severe anoxic brain injury.  2. Severe coronary artery disease with cardiomyopathy.  3. History of urinary frequency.  4. Bilateral rotator cuff syndrome.  5. Persistent cough.   PLAN:  1. Since the patient's cough is so predominant today in the office and      has been present despite change of medication and considering the      fact that Diovan was recently cut in half without significant      change in blood pressure, I think it would be  worthwhile to stop      his Diovan to watch pressure and see if cough subsides.  The      patient will be seeing Dr. Daleen Squibb shortly for followup.  I know that      he would like [name] Cummings on the Diovan for after load reduction.  2. For mood control and nocturnal incontinence, we will start Tegretol      100 mg b.i.d. titrating up to t.i.d. after 1 week's time.  Observe      for affects.  3. Continue Aricept 10 mg for now.  The patient states that he has      Exelon from his mother who recently passed away, that is still good      and this might be reasonable to use short-term to replace Aricept      from a cough standpoint.  4. I felt that it was okay to leave [name] Cummings home for short period of      time at home to see how he does in his environment by himself.  He      is  certainly should not be left more than half hour to an hour at a      time initially.  He  is able to call his wife's number if something      is wrong and has displayed the ability already in the past.  5. I will see [name] Cummings back in about 2 months' time.  We do collect the      UA and C&S as well today.      Ranelle Oyster, M.D.  Electronically Signed     ZTS/MedQ  D:  07/21/2008 15:03:11  T:  07/22/2008 05:55:31  Job #:  [id number]   cc:   Thomas C. Wall, MD, FACC  [address]  [address]  [place]  [place] [address]

## 2010-11-12 NOTE — Assessment & Plan Note (Signed)
[name] Cummings is back regarding his anoxic brain injury.  He still has some  anxiety.  His Lexapro was increased to 10 mg and this has controlled  that somewhat more efficiently.  He still has significant day to day  memory deficits.  Wife does note that he is remembering more  information, particularly people's names and more important events and  repetitive things.  He can perform tasks around the house although  sometimes he is resistant and will not do a particular thing.  He can be  impulsive at times but generally this impulsivity is better.   REVIEW OF SYSTEMS:  Notable for the above.  He has had no further  problems with his heart.  He sees his Cardiologist this week.   SOCIAL HISTORY:  Patient is married, living with his wife who remains  very supportive.   PHYSICAL EXAMINATION:  Blood pressure is 117/56, pulse is 56,  respiratory rate 18, he is sating 99% room air.  Patient is pleasant,  alert and oriented to place and name but not date.  He could not  remember who I was.  He is very humorous and funny however has poor  insight and awareness overall.  He can not follow simple one step  commands.  He is a bit impulsive still.  He has minimal shoulder  tenderness today.  He has good range of motion to the low back and neck.  HEART:  Regular rhythm but slightly bradycardic.  CHEST:  Clear.  ABDOMEN:  Soft, nontender.   ASSESSMENT:  1. Sever anoxic brain injury.  2. Coronary artery disease.  3. Urinary frequency and insomnia associated with this.  4. History of bilateral rotator cuff syndrome and bicipital      tendinitis.  5. Hypertension.  6. Dysphagia.   PLAN:  1. Continue Rozerem at night but perhaps decrease to a 4 mg dose, see      if he is more aware of need to void.  He also needs to work on less      p.o. intake after dinner.  2. Switched Ditropan to nighttime as opposed to day, apparently he is      still using it during the daytime.  3. Continue Aricept for memory 10  mg nightly as well as vitamin      supplementation.  4. I talked to his wife about advising a schedule of sorts for his      daily routine and activities at home that can become repetitive and      I feel that if he has something repetitive and structured in place      he may be able to      remember and acquire a bit more of a schedule so to speak for his      daily activities.  5. I will see him back in 12 month's time.      Ranelle Oyster, M.D.  Electronically Signed     ZTS/MedQ  D:  04/05/2007 12:53:57  T:  04/05/2007 16:14:54  Job #:  [id number]   cc:   Thomas C. Wall, MD, FACC  [address]  [address]  [place]  [place] [address]

## 2010-11-15 NOTE — Consult Note (Signed)
[name], Cummings                ACCOUNT NO.:  [id number]   MEDICAL RECORD NO.:  [id number]          PATIENT TYPE:  IPS   LOCATION:  4027                         FACILITY:  [facility]   PHYSICIAN:  Charlynne Pander, D.D.S.DATE OF BIRTH:  08-09-1960   DATE OF CONSULTATION:  05/11/2006  DATE OF DISCHARGE:                                   CONSULTATION   [name] Cummings is a 50 year old male referred by Dr. Faith Rogue for dental  consultation.  The patient suffered a cardiac arrest with significant anoxic  brain injury.  Patient with protracted hospital course.  The patient  currently with rehabilitation on the inpatient floor with Dr. Faith Rogue.  A dental consultation has been requested to evaluate poor dentition  and questionable history of toothache.   MEDICAL HISTORY:  1. History of cardiac arrest and subsequent anoxic brain injury.  Patient      with cardiac catheterization in October2007 with drug eluting stent      placed in the LAD.  2. Coronary disease status post myocardial infarction in April2007.  The      patient was treated with angioplasty and bare mental stent placement in      the LAD.  Patient with subsequent restenosis of the LAD with subsequent      drug-eluting stent placement per cardiac catheterization performed on      March 31, 2006.  3. History of ventilator dependent respiratory failure.  The patient then      underwent tracheostomy procedure on April 13, 2006.  The patient went      off the vent with no longer utilization of the tracheostomy.  4. History of dysphagia with gastrostomy tube placement.  5. History of seizure x1 in April2007.  6. Ischemic cardiomyopathy with ejection fraction approximately 30-35%.  7. History of renal insufficiency.  8. History of congestive heart failure.  9. History of methicillin-sensitive Staph aureus pneumonia.  10.History of sinus tachycardia.  11.Hyperlipidemia.   ALLERGIES/ADVERSE DRUG REACTION:  1.  LIPITOR.  2. HYDROMORPHONE/DILAUDID.   MEDICATIONS:  1. Tylenol 500 mg three times daily.  2. Aspirin 325 25 mg daily.  3. Lioresal 5 mg twice daily.  4. Coreg 18.75 mg twice daily.  5. Plavix 75 mg daily.  6. Lovenox 40 mg daily.  7. Vasotec 2.5 mg twice daily.  8. NovoLog insulin per sliding scale three times daily.  9  Lantus Insulin 5 units every 12 hours.  1. Jevity per orders.  2. Provigil 100 mg daily.  3. Nystatin 5 mL rinse three times daily.  4. Protonix 5 mg twice daily.  5. Crestor 10 mg every evening.  6. Senokot 10 mL via tube daily.  7. Aldactone 25 mg via tube daily.  8. Desyrel 50 mg every evening.   SOCIAL HISTORY:  The patient is married.  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  The patient is a nonsmoker and a nondrinker.  The patient did have use of smokeless tobacco.   FAMILY HISTORY:  Father with a history of coronary disease, stroke and  previous  bypass surgery.  Mother with a history of diabetes mellitus and  valvular heart disease.   FUNCTIONAL ASSESSMENT:  The patient is currently dependent for multiple  instrumental and basic ADLs.   REVIEW OF SYSTEMS:  This is reviewed from the chart and health history  assessment form for this admission.   DENTAL HISTORY.:   CHIEF COMPLAINT:  Dental consultation requested evaluate poor dentition and  questionable history of a toothache.   HISTORY OF PRESENT ILLNESS:  Patient with history of cardiac arrest and  subsequent anoxic brain injury.  Patient with a protracted hospital course.  The patient now undergoing rehab in the inpatient floor under Dr. Faith Rogue.  Dental consultation has been requested to evaluate poor dentition  and questionable history of toothache, this is at the request primarily of  the family.  The patient currently is unable to provide any information  concerning previous or current dental problems.  The wife provides most of  the dental history.  The wife  indicates the patient, prior to having the  cardiac arrest, had been complaining of a toothache involving the mandibular  anterior area. The wife is unable to determine the exact tooth number in  question.  The patient was seen by Dr. Jon Billings in [place], [place], approximately two years ago.  The patient was evaluated for a  toothache at that time, as well.  The wife currently denies seeing any  facial swelling or abscess formation but indicates that the patient  grimaces from time to time..   DENTAL EXAM:  GENERAL:  The patient is a well-developed and well-nourished  male in no acute distress.  VITAL SIGNS:  Blood pressure is 140/66, pulse rate 86, respirations are 18,  temperature is 97.3.  HEAD EXAM:  There is no obvious extraoral swelling or lymphadenopathy.  The  patient denies acute TMJ symptoms.  INTRAORAL EXAM:  Minimal exam is allowed by the patient at this time due to  the cognitive defects.  No obvious intraoral abscesses were noted.  DENTITION:  There are multiple missing teeth.  I would need a dental x-ray  to identify the exact tooth numbers presence/missing.  PERIODONTAL:  Patient with relatively good oral hygiene.  The wife is  providing the dental hygiene at this time along with the aid of the nursing  staff.  The wife has been brushing the teeth with toothpaste and following  this with mouth rinse.  I will provide a prescription for chlorhexidine  rinse to aid in disinfection of the oral cavity at this time.  DENTAL CARIES:  There are no obvious dental caries noted at this time.  I  would need a full series of dental radiographs to identify the extent of the  dental disease, however.  I will also need to obtain dental x-rays from the  previous dentist, if possible.  ENDODONTIC:  Patient with questionable history of toothache symptoms.  Will  need to rule out current periapical pathology if a panoramic x-ray can be obtained.  Rehab staff will assist in  determining when the patient is able  to stand or sit upright in a wheelchair to allow the ortho-panoramic x-ray  to be taken in the department of radiology.  CROWN AND BRIDGE:  There are no obvious crown or bridge restorations noted.  PROSTHODONTIC :  There is no evidence of dentures.  OCCLUSION:  Patient with poor occlusal scheme secondary to multiple missing  teeth, supereruption and drifting of the unopposed teeth into the  edentulous  areas, and no obvious dental prosthesis.   RADIOGRAPHIC INTERPRETATION:  A panoramic x-ray will be obtained once the  patient is able to stand or sit in a wheelchair appropriately.  Rehab will  assist in determining when this dental x-ray is able to be taken.  In the  meantime, I will attempt to obtain previous dental x-rays from the primary  dentist.   ASSESSMENT:  1. Questionable history of toothache symptoms.  This was by report of the      wife.  There is no obvious lymphadenopathy or extraoral or intraoral      swelling noted at this time.  Please note, however, there was a limited      exam allowed by the patient at this time due to lack of cooperation      consistent with his cognitive defect.  2. Chronic periodontal disease with good oral hygiene at this time      provided by the wife and nursing staff.  3. Multiple missing teeth.  4. Dental caries but no significant dental caries noted at this time.  5. Endodontic:  Patient with a questionable history of toothache symptoms.      Will need dental x-rays to rule out periapical pathology.  6. No history of dentures.  7. Poor occlusal scheme.  8. History of previous oral neglect.  9. Risk for bleeding with invasive dental procedures due to the current      aspirin, Plavix and Lovenox therapies.  Patient with a recent drug-      eluting stent placement which would require Plavix therapy for least a      year.  10.Significant cardiovascular compromise with significant risk for further       cardiovascular complications including stroke, heart attack, and      complications up to and including death.   PLAN/RECOMMENDATIONS/:  I discussed the risks, benefits, and complications  of various treatment options with the patient and wife in relationship to  the patient's medical and dental condition.  We discussed various treatment  options to include no treatment, obtaining dental x-ray, and then further  discussion of the risks for invasive dental procedures that might need to be  performed.  Currently we will defer any dental treatment until a dental x-  ray can be obtained.  This is most likely after the patient is able to  either stand or sit upright in a wheelchair that will on ortho panoramic x-  ray to be taken in the department of radiology.  In the meantime, rehab will  contact dental medicine if acute swelling or intraoral abscesses arise.  I,  at this time, do not see any significant abscesses or infections in the mouth.  We will attempt to obtain previous dental x-rays from the primary  dentist if they are available.  Will suggest the use of chlorhexidine rinses  or brushing of the teeth 2-3 times a day to aid in disinfection of the oral  cavity.  The wife has been provided the instructions on use along with the  nursing staff.      Charlynne Pander, D.D.S.  Electronically Signed     RFK/MEDQ  D:  05/11/2006  T:  05/11/2006  Job:  [id number]   cc:   Ranelle Oyster, M.D.

## 2010-11-15 NOTE — Op Note (Signed)
NAMEAris Cummings              ACCOUNT NO.:  [id number]   MEDICAL RECORD NO.:  [id number]           PATIENT TYPE:   LOCATION:                                 FACILITY:   PHYSICIAN:  [name] Dare. Janee Cummings, M.D.     DATE OF BIRTH:   DATE OF PROCEDURE:  04/14/2006  DATE OF DISCHARGE:                                 OPERATIVE REPORT   PREOPERATIVE DIAGNOSIS:  1. Ventilator dependent respiratory failure.  2. Anoxic brain injury status post hospital cardiac arrest.   POSTOPERATIVE DIAGNOSES:  1. Ventilator dependent respiratory failure.  2. Anoxic brain injury status post hospital cardiac arrest.   PROCEDURE:  Bedside tracheostomy with #6 Shiley.   SURGEON:  [name] Dare. Janee Cummings, M.D.   ASSISTANT:  Shawn Rayburn, PA-C   HISTORY OF PRESENT ILLNESS:  The patient is a 50 year old gentleman who  unfortunately suffered an out-of-hospital cardiac arrest.  He does have a  significant history of coronary artery disease.  He remains ventilator  dependent with anoxic encephalopathy.  We are proceeding with tracheostomy.   PROCEDURE IN DETAIL:  An informed consent was obtained from the patient's  wife his Lovenox Plavix and aspirin have been on hold.  He remains monitored  and intubated in the cardiac intensive care unit.  He received muscle  relaxation fentanyl and Versed, intravenously.  A roll was placed behind his  shoulders; and his neck was prepped and draped in sterile fashion.  1.5%  lidocaine with epinephrine was injected along our planned incision site from  the blue rhino kit.  A limited transverse incision was made.  Subcutaneous  tissues were dissected down through the platysma.  The strap muscles were  divided along the midline; and the trachea was exposed.  The space between  the second and third tracheal ring was selected.  Under direct palpation the  endotracheal tube was withdrawn by respiratory to above the area of our  planned entry of the trachea.  The cuff was reinflated;  and he continued to  get volumes.  The Angiocath was then inserted into the trachea followed by  the guidewire.  Then the small blue dilator from the kit was used, followed  by the blue rhino dilator.   We then inserted the #6 Shiley tracheostomy tube over the 26-French blue  dilator.  The inner cannula was placed and the Omni flex was used and the  tracheostomy tube was hooked up to the ventilator circuit with excellent  volumes returned.  Saturations remained good. A dressing was placed around  the tracheostomy site; and it was sutured in place with four 3-0 nylons from  the blue rhino kit.  The cuff had been inflated.  The endotracheal tube  was removed from above by respiratory therapy as he continued to get  excellent volumes.  We then suctioned him using a sterile suction catheter;  and cleaned out a moderate amount of yellow sputum.  Saturations remained in  the 90s.  He tolerated the procedure without apparent complication, and  remained monitored in the intensive care unit.      [name] Cummings,  M.D.  Electronically Signed     BET/MEDQ  D:  04/14/2006  T:  04/15/2006  Job:  [id number]   cc:   [name] Cummings. [name] Chance, MD, [facility]  [name] Helling, MD

## 2010-11-15 NOTE — Procedures (Signed)
NAMEHAKEEN, SHIPES                ACCOUNT NO.:  [id number]   MEDICAL RECORD NO.:  [id number]          PATIENT TYPE:  EMS   LOCATION:  ED                            FACILITY:  [facility]   PHYSICIAN:  Edward L. Juanetta Gosling, M.D.DATE OF BIRTH:  04/23/1961   DATE OF PROCEDURE:  03/30/2006  DATE OF DISCHARGE:  03/30/2006                                EKG INTERPRETATION   TIME:  2046 hours on March 30, 2006.   RESULTS:  The rhythm appears to be atrial fibrillation with a rapid  ventricular response.  There is an interventricular conduction delay.  There  is a lot of baseline waver.  There are PVCs.  The axis is leftward, but does  not meet criteria for left axis deviation.  There are ST and T wave changes  which could be due to ischemia and clinical correlation is suggested.   IMPRESSION:  Abnormal electrocardiogram.      Edward L. Juanetta Gosling, M.D.  Electronically Signed     ELH/MEDQ  D:  04/01/2006  T:  04/02/2006  Job:  [id number]

## 2010-11-15 NOTE — Procedures (Signed)
EEG#:  [id number]   HISTORY:  This 50 year old patient status post cardiac arrest, pulmonary  edema, unresponsive.  The patient is being evaluated for possible seizures.  This is a portable EEG recording.  No skull defects are noted.   MEDICATIONS:  Lopressor, Lipitor, Plavix, Protonix, Vasotec, Versed, Zofran,  and Tylenol.   EEG CLASSIFICATION:  Dysrhythmia grade-I generalized.   DESCRIPTION OF THE RECORDING:  The background rhythms of this recording  consisted of a fairly well-modulated medium amplitude theta activity of 6  Hz.  As the record progresses, the patient appears to have some variability  in the background activities.  At times, muscle artifact is more prominent  than at others and there is no definite reactivity to stimulation during the  recording.  At no time during the recording does there appear to be evidence  of spike or spike-wave discharges or evidence of focal slowing.  The EKG  monitor shows no evidence of cardiac rhythm abnormalities with a heart rate  of 84.   IMPRESSION:  This is an abnormal electroencephalogram recording due to  generalized background slowing.  No evidence of ictal or interictal  discharges were seen.  The background amplitudes are well within normal  range.  This study would be consistent with an underlying anoxic  encephalopathy.  Again, no seizures or discharges were seen.      Marlan Palau, M.D.  Electronically Signed     ZOX:WRUE  D:  04/03/2006 18:37:00  T:  04/05/2006 12:18:26  Job #:  [id number]

## 2011-03-21 ENCOUNTER — Encounter: Payer: Self-pay | Admitting: Cardiology

## 2011-04-04 LAB — APTT: aPTT: 29

## 2011-04-04 LAB — TROPONIN I: Troponin I: 0.02

## 2011-04-04 LAB — CK TOTAL AND CKMB (NOT AT ARMC)
CK, MB: 1.1
CK, MB: 1.3
Relative Index: 1
Relative Index: INVALID
Total CK: 98

## 2011-04-04 LAB — POCT CARDIAC MARKERS
CKMB, poc: <1 — ABNORMAL LOW
Myoglobin, poc: 49.6
Myoglobin, poc: 58
Operator id: 161631
Troponin i, poc: <0.05
Troponin i, poc: <0.05

## 2011-04-04 LAB — CBC
HCT: 37.6 — ABNORMAL LOW
Hemoglobin: 12.6 — ABNORMAL LOW
Hemoglobin: 12.7 — ABNORMAL LOW
MCHC: 34.4
Platelets: 233
RBC: 4.31
RDW: 12.4
WBC: 6.8

## 2011-04-04 LAB — I-STAT 8, (EC8 V) (CONVERTED LAB)
BUN: 9
Bicarbonate: 28.8 — ABNORMAL HIGH
Chloride: 103
HCT: 39
Operator id: 161631
pCO2, Ven: 52.7 — ABNORMAL HIGH
pH, Ven: 7.345 — ABNORMAL HIGH

## 2011-04-04 LAB — BASIC METABOLIC PANEL
BUN: 7
CO2: 28
Calcium: 8.9
Creatinine, Ser: 0.84
GFR calc non Af Amer: >60
Glucose, Bld: 102 — ABNORMAL HIGH
Sodium: 138

## 2011-04-04 LAB — LIPID PANEL
Cholesterol: 124
HDL: 30 — ABNORMAL LOW
LDL Cholesterol: 82
Total CHOL/HDL Ratio: 4.1
Triglycerides: 61
VLDL: 12

## 2011-04-04 LAB — DIFFERENTIAL
Eosinophils Relative: 3
Lymphocytes Relative: 28
Lymphs Abs: 1.9
Monocytes Absolute: 0.7

## 2011-04-04 LAB — HEPATIC FUNCTION PANEL
ALT: 34
AST: 35
Alkaline Phosphatase: 87
Bilirubin, Direct: 0.2
Indirect Bilirubin: 0.4

## 2011-04-04 LAB — POCT I-STAT CREATININE
Creatinine, Ser: 1.1
Operator id: 161631

## 2011-04-04 LAB — PROTIME-INR: Prothrombin Time: 12.5

## 2011-04-04 LAB — CARDIAC PANEL(CRET KIN+CKTOT+MB+TROPI): Total CK: 86

## 2011-07-02 ENCOUNTER — Encounter: Payer: Self-pay | Admitting: Cardiology

## 2011-07-03 ENCOUNTER — Ambulatory Visit (INDEPENDENT_AMBULATORY_CARE_PROVIDER_SITE_OTHER): Payer: 59 | Admitting: Cardiology

## 2011-07-03 ENCOUNTER — Encounter: Payer: Self-pay | Admitting: Cardiology

## 2011-07-03 VITALS — BP 118/78 | HR 55 | Ht 72.0 in | Wt 256.0 lb

## 2011-07-03 DIAGNOSIS — I2589 Other forms of chronic ischemic heart disease: Secondary | ICD-10-CM

## 2011-07-03 DIAGNOSIS — G931 Anoxic brain damage, not elsewhere classified: Secondary | ICD-10-CM

## 2011-07-03 DIAGNOSIS — I4901 Ventricular fibrillation: Secondary | ICD-10-CM

## 2011-07-03 DIAGNOSIS — E785 Hyperlipidemia, unspecified: Secondary | ICD-10-CM

## 2011-07-03 DIAGNOSIS — I2510 Atherosclerotic heart disease of native coronary artery without angina pectoris: Secondary | ICD-10-CM

## 2011-07-03 DIAGNOSIS — I252 Old myocardial infarction: Secondary | ICD-10-CM | POA: Insufficient documentation

## 2011-07-03 DIAGNOSIS — I10 Essential (primary) hypertension: Secondary | ICD-10-CM

## 2011-07-03 MED ORDER — ROSUVASTATIN CALCIUM 10 MG PO TABS: 10.0000 mg | ORAL_TABLET | Freq: Every day | ORAL | Status: DC

## 2011-07-03 NOTE — Patient Instructions (Signed)
Your physician wants you to follow-up in:  12 months.  You will receive a reminder letter in the mail two months in advance. If you don't receive a letter, please call our office to schedule the follow-up appointment.   

## 2011-07-03 NOTE — Assessment & Plan Note (Signed)
Followed by primary care. Crestor renewed however.

## 2011-07-03 NOTE — Progress Notes (Signed)
HPI [name] comes in today for evaluation and management of his coronary disease and ischemic cardiopathy. He is unreliable as a historian but his wife and daughter state  That he has been stable without complaints.  He is compliant with his meds with his wife's health. He refuses exercise. He continues to gain some weight.  Past Medical History  Diagnosis Date  . Cardiomyopathy   . Acute MI, anterior [name]   . Ventricular fibrillation   . HTN (hypertension)     unspecified  . Hyperlipidemia, mixed   . Anxiety   . Depression   . Dysphagia, unspecified   . History of seizure disorder     Current Outpatient Prescriptions  Medication Sig Dispense Refill  . aspirin 325 MG tablet Take 325 mg by mouth daily.        . carvedilol (COREG) 25 MG tablet Take 25 mg by mouth 2 (two) times daily with a meal.        . clopidogrel (PLAVIX) 75 MG tablet Take 75 mg by mouth daily.        Marland Kitchen dexlansoprazole (DEXILANT) 60 MG capsule Take 60 mg by mouth daily.        Marland Kitchen escitalopram (LEXAPRO) 20 MG tablet Take 20 mg by mouth daily.        . rosuvastatin (CRESTOR) 10 MG tablet Take 1 tablet (10 mg total) by mouth daily.  30 tablet  11    Allergies  Allergen Reactions  . Atorvastatin     REACTION: Reaction not known  . Hydromorphone Hcl     REACTION: Reaction not known  . Lorazepam     REACTION: Reaction not known  . Pseudoephedrine     REACTION: Reaction not known  . Terfenadine     REACTION: Reaction not known    Family History  Problem Relation Age of Onset  . Heart attack Mother   . Heart attack Father   . Coronary artery disease Neg Hx     Siblings    History   Social History  . Marital Status: Married    Spouse Name: N/A    Number of Children: N/A  . Years of Education: N/A   Occupational History  . Not on file.   Social History Main Topics  . Smoking status: Never Smoker   . Smokeless tobacco: Not on file  . Alcohol Use: No  . Drug Use: No  . Sexually Active: Not on  file   Other Topics Concern  . Not on file   Social History Narrative  . No narrative on file    ROS ALL NEGATIVE EXCEPT THOSE NOTED IN HPI  PE  General Appearance: well developed, well nourished in no acute distress, obese HEENT: symmetrical face, PERRLA, good dentition  Neck: no JVD, thyromegaly, or adenopathy, trachea midline Chest: symmetric without deformity Cardiac: PMI non-displaced, RRR, normal S1, S2, no gallop or murmur Lung: clear to ausculation and percussion Vascular: all pulses full without bruits  Abdominal: nondistended, nontender, good bowel sounds, no HSM, no bruits Extremities: no cyanosis, clubbing or edema, no sign of DVT, no varicosities  Skin: normal color, no rashes Neuro: alert and oriented x 3, non-focal Pysch: Baseline  EKG Sinus bradycardia, otherwise normal EKG BMET    Component Value Date/Time   NA 142 07/16/2009 0915   K 4.4 07/16/2009 0915   CL 105 07/16/2009 0915   CO2 25 07/16/2009 0915   GLUCOSE 91 07/16/2009 0915   BUN 10 07/16/2009 0915  CREATININE 1.2 07/16/2009 0915   CALCIUM 9.3 07/16/2009 0915   GFRNONAA 68.55 07/16/2009 0915   GFRAA  Value: >60        The eGFR has been calculated using the MDRD equation. This calculation has not been validated in all clinical situations. eGFR's persistently <60 mL/min signify possible Chronic Kidney Disease. 12/05/2008 0631    Lipid Panel     Component Value Date/Time   CHOL 148 07/16/2009 0915   TRIG 80.0 07/16/2009 0915   HDL 40.70 07/16/2009 0915   CHOLHDL 4 07/16/2009 0915   VLDL 16.0 07/16/2009 0915   LDLCALC 91 07/16/2009 0915    CBC    Component Value Date/Time   WBC 5.8 12/05/2008 0631   RBC 4.33 12/05/2008 0631   HGB 12.8* 12/05/2008 0631   HCT 37.5* 12/05/2008 0631   PLT 185 12/05/2008 0631   MCV 86.5 12/05/2008 0631   MCHC 34.2 12/05/2008 0631   RDW 12.5 12/05/2008 0631   LYMPHSABS 1.5 12/01/2008 1600   MONOABS 0.6 12/01/2008 1600   EOSABS 0.3 12/01/2008 1600   BASOSABS 0.0 12/01/2008 1600

## 2011-07-03 NOTE — Assessment & Plan Note (Signed)
Stable. No change in medical therapy. 

## 2011-09-26 ENCOUNTER — Other Ambulatory Visit: Payer: Self-pay | Admitting: Cardiology

## 2012-11-04 ENCOUNTER — Other Ambulatory Visit: Payer: Self-pay | Admitting: Cardiology

## 2014-05-12 ENCOUNTER — Encounter: Payer: Self-pay | Admitting: Cardiology

## 2014-05-12 ENCOUNTER — Ambulatory Visit (INDEPENDENT_AMBULATORY_CARE_PROVIDER_SITE_OTHER): Payer: 59 | Admitting: Cardiology

## 2014-05-12 VITALS — BP 106/64 | HR 73 | Ht 73.0 in | Wt 268.0 lb

## 2014-05-12 DIAGNOSIS — E785 Hyperlipidemia, unspecified: Secondary | ICD-10-CM | POA: Diagnosis not present

## 2014-05-12 DIAGNOSIS — I10 Essential (primary) hypertension: Secondary | ICD-10-CM | POA: Diagnosis not present

## 2014-05-12 DIAGNOSIS — I25119 Atherosclerotic heart disease of native coronary artery with unspecified angina pectoris: Secondary | ICD-10-CM

## 2014-05-12 NOTE — Progress Notes (Signed)
Clinical Summary [name] Cummings is a 53 y.o.male former patient of Dr Daleen Squibb, this is our first visit together. He is seen for the following medical problems.  1. CAD - hx of BMS to LAD in 2007, followed by stent thrombosis with resulting VF arrest. LAD was restented at that time. - cath 2010 LAD 50% prox, 30% ISR in mid LAD, distal LAD 70-80%, LCX 40% ostial, 70-80% narrowing proximal portion LCX in the ramus [name] Cummings, RCA 70-80% proximal. LVEF 45%, the distal LAD was stented.   - denies any chest pain. No SOB or DOE. No orthopnea, no PND.  - compliant with meds  2. HTN - does not check at home - compliant with meds  3. Hyperlipidemia - leg pains on atorvastatin, tolerating simva 20mg  daily. Crestor was too expensive.    Past Medical History  Diagnosis Date  . Cardiomyopathy   . Acute MI, anterior wall   . Ventricular fibrillation   . HTN (hypertension)     unspecified  . Hyperlipidemia, mixed   . Anxiety   . Depression   . Dysphagia, unspecified   . History of seizure disorder      Allergies  Allergen Reactions  . Atorvastatin     REACTION: Reaction not known  . Hydromorphone Hcl     REACTION: Reaction not known  . Lorazepam     REACTION: Reaction not known  . Pseudoephedrine     REACTION: Reaction not known  . Terfenadine     REACTION: Reaction not known     Current Outpatient Prescriptions  Medication Sig Dispense Refill  . aspirin 325 MG tablet Take 325 mg by mouth daily.      . carvedilol (COREG) 25 MG tablet Take 25 mg by mouth 2 (two) times daily with a meal.      . clopidogrel (PLAVIX) 75 MG tablet TAKE ONE TABLET BY MOUTH EVERY DAY 30 tablet 0  . dexlansoprazole (DEXILANT) 60 MG capsule Take 60 mg by mouth daily.      Marland Kitchen escitalopram (LEXAPRO) 20 MG tablet Take 20 mg by mouth daily.      . rosuvastatin (CRESTOR) 10 MG tablet Take 1 tablet (10 mg total) by mouth daily. 30 tablet 11   No current facility-administered medications for this visit.      Past Surgical History  Procedure Laterality Date  . Stent thrombosis  03/2006    Complicated my sudden cardiac death  . Kidney stone surgery    . Cardiac catheterization  09/2005    with bare metal stent to the mid LAD (Minivision 2.5 x 18 mm) with Provisional balloon angioplasty to the second diagonal.  . Cardiac catheterization  03/2006    with stenting of the mid LAD with Taxus 2.5 x 20 mm and 2.5 x 8 mm stents.      . Bedside tracheostomy      with #6 Shiley. 04/14/2006  . Esophagogastroduodenoscopy    . Peg tube placement       Allergies  Allergen Reactions  . Atorvastatin     REACTION: Reaction not known  . Hydromorphone Hcl     REACTION: Reaction not known  . Lorazepam     REACTION: Reaction not known  . Pseudoephedrine     REACTION: Reaction not known  . Terfenadine     REACTION: Reaction not known      Family History  Problem Relation Age of Onset  . Heart attack Mother   . Heart attack Father   .  Coronary artery disease Neg Hx     Siblings     Social History [name] reports that he has never smoked. He does not have any smokeless tobacco history on file. [name] reports that he does not drink alcohol.   Review of Systems CONSTITUTIONAL: No weight loss, fever, chills, weakness or fatigue.  HEENT: Eyes: No visual loss, blurred vision, double vision or yellow sclerae.No hearing loss, sneezing, congestion, runny nose or sore throat.  SKIN: No rash or itching.  CARDIOVASCULAR: per HPI RESPIRATORY: No shortness of breath, cough or sputum.  GASTROINTESTINAL: No anorexia, nausea, vomiting or diarrhea. No abdominal pain or blood.  GENITOURINARY: No burning on urination, no polyuria NEUROLOGICAL: memory problems. MUSCULOSKELETAL: No muscle, back pain, joint pain or stiffness.  LYMPHATICS: No enlarged nodes. No history of splenectomy.  PSYCHIATRIC: No history of depression or anxiety.  ENDOCRINOLOGIC: No reports of sweating, cold or heat  intolerance. No polyuria or polydipsia.  Marland Kitchen.   Physical Examination p 73 bp 106/64 Wt 268 lbs BMI 35 Gen: resting comfortably, no acute distress HEENT: no scleral icterus, pupils equal round and reactive, no palptable cervical adenopathy,  CV: RRR, no m/r/g, no JVD, no carotid bruits Resp: Clear to auscultation bilaterally GI: abdomen is soft, non-tender, non-distended, normal bowel sounds, no hepatosplenomegaly MSK: extremities are warm, no edema.  Skin: warm, no rash Neuro:  no focal deficits Psych: appropriate affect   Diagnostic Studies 11/2008 Cath RESULTS: Left main coronary artery: The left main coronary artery was free of significant disease.  Left anterior descending: The left anterior descending artery gave rise to small diagonal [name] Cummings and small septal perforator, and larger diagonal [name] Cummings. There was 30% narrowing within the midportion of the previously placed stents. Distal to the stent, there was an eccentric lesion, which appeared to be a ruptured plaque, which was estimated to be 70-80% obstructive. There also was 40-50% diffuse narrowing in the proximal LAD.  Circumflex artery: The circumflex artery gave rise to a large ramus [name] Cummings and two posterolateral branches and a posterior descending [name] Cummings. This was a dominant vessel. There was 70-80% narrowing in the proximal portion in the ramus [name] Cummings which was extended over about 18 mm. There was also a 40% ostial narrowing in the circumflex artery and 50% narrowing in the ostium of the ramus [name] Cummings.  Right coronary artery: The right coronary artery was a nondominant vessel which supplied only right ventricular branches. There was a 70- 80% proximal stenosis.  Left ventriculogram: The left ventriculogram was performed in the RAO projection showed hypokinesis of the anterolateral wall and apex. The estimated fraction was 45%.  Following stenting of the lesion in the mid LAD,  the stenosis improved from 80% to 0%.  CONCLUSIONS: 1. Coronary artery disease, status post previous percutaneous coronary  interventions as described above with 40% narrowing in the proximal  LAD, 30% narrowing within the stent in the mid LAD, and 80%  narrowing in the mid LAD after the stent, 70-80% narrowing in the  large ramus Dontavion Noxon of the circumflex artery with 40% ostial  stenosis in the dominant circumflex artery, and 80% narrowing in  the proximal portion of a nondominant right coronary artery with  anterolateral wall and apical wall hypokinesis and estimated  ejection fraction of 45%. 2. Successful PCI of the lesion in the mid LAD distal to the  previously placed stent using a XIENCE drug-eluting stent with  improvement in center narrowing from 80% to 0%.  [facility]       Assessment and Plan  1. CAD - no current symptoms. Based on records I assume he has been continued on plavix due to prior history of stent thrombosis, will continue. - continue risk factor modification and secondary prevention - no recent echo, prior LV gram described some mild LV dysfunction. Repeat echo, if persistent LV dysfunction will need medication modification  2 HTN - at goal, continue current meds  3. Hyperlipidemia - continue current statin, not able to tolerate atrovastatin. Crestor was too expensive   F/u 1 year      Antoine PocheJonathan F. Lyndell Gillyard, MD

## 2014-05-12 NOTE — Patient Instructions (Signed)
Your physician wants you to follow-up in: 1 year You will receive a reminder letter in the mail two months in advance. If you don't receive a letter, please call our office to schedule the follow-up appointment.     Your physician has recommended you make the following change in your medication:     DECREASE Aspirin to 1/2 325 mg daily      Your physician has requested that you have an echocardiogram. Echocardiography is a painless test that uses sound waves to create images of your heart. It provides your doctor with information about the size and shape of your heart and how well your heart's chambers and valves are working. This procedure takes approximately one hour. There are no restrictions for this procedure.

## 2014-05-15 ENCOUNTER — Telehealth: Payer: Self-pay | Admitting: *Deleted

## 2014-05-15 NOTE — Telephone Encounter (Signed)
Received labs, in Dr. Branch folder 

## 2014-05-24 ENCOUNTER — Other Ambulatory Visit (HOSPITAL_COMMUNITY): Payer: 59

## 2014-06-08 ENCOUNTER — Encounter: Payer: Self-pay | Admitting: Cardiology

## 2014-07-07 ENCOUNTER — Ambulatory Visit (HOSPITAL_COMMUNITY)
Admission: RE | Admit: 2014-07-07 | Discharge: 2014-07-07 | Disposition: A | Discharge status: Home / Self Care | Payer: 59 | Source: Ambulatory Visit | Attending: Cardiology | Admitting: Cardiology

## 2014-07-07 DIAGNOSIS — I517 Cardiomegaly: Secondary | ICD-10-CM | POA: Diagnosis not present

## 2014-07-07 DIAGNOSIS — I2510 Atherosclerotic heart disease of native coronary artery without angina pectoris: Secondary | ICD-10-CM | POA: Diagnosis not present

## 2014-07-07 DIAGNOSIS — E785 Hyperlipidemia, unspecified: Secondary | ICD-10-CM | POA: Insufficient documentation

## 2014-07-07 DIAGNOSIS — I25119 Atherosclerotic heart disease of native coronary artery with unspecified angina pectoris: Secondary | ICD-10-CM

## 2014-07-07 DIAGNOSIS — I10 Essential (primary) hypertension: Secondary | ICD-10-CM | POA: Diagnosis not present

## 2014-07-07 NOTE — Progress Notes (Signed)
  Echocardiogram 2D Echocardiogram has been performed.  [name] Cummings 07/07/2014, 1:57 PM

## 2015-07-10 ENCOUNTER — Ambulatory Visit (INDEPENDENT_AMBULATORY_CARE_PROVIDER_SITE_OTHER): Payer: 59 | Admitting: Cardiology

## 2015-07-10 ENCOUNTER — Encounter: Payer: Self-pay | Admitting: Cardiology

## 2015-07-10 VITALS — BP 112/78 | HR 57 | Ht 75.0 in | Wt 262.0 lb

## 2015-07-10 DIAGNOSIS — I4901 Ventricular fibrillation: Secondary | ICD-10-CM | POA: Diagnosis not present

## 2015-07-10 DIAGNOSIS — E785 Hyperlipidemia, unspecified: Secondary | ICD-10-CM

## 2015-07-10 DIAGNOSIS — I10 Essential (primary) hypertension: Secondary | ICD-10-CM

## 2015-07-10 DIAGNOSIS — R079 Chest pain, unspecified: Secondary | ICD-10-CM | POA: Diagnosis not present

## 2015-07-10 DIAGNOSIS — I2510 Atherosclerotic heart disease of native coronary artery without angina pectoris: Secondary | ICD-10-CM | POA: Diagnosis not present

## 2015-07-10 NOTE — Patient Instructions (Signed)
Medication Instructions:  Your physician recommends that you continue on your current medications as directed. Please refer to the Current Medication list given to you today.   Labwork: I WILL REQUEST LABS FROM YOUR PCP  Testing/Procedures: Your physician has requested that you have a lexiscan myoview. For further information please visit [email or web address]. Please follow instruction sheet, as given.    Follow-Up: Your physician recommends that you schedule a follow-up appointment in: TO BE DETERMINED BASED ON STRESS TEST RESULTS   Any Other Special Instructions Will Be Listed Below (If Applicable).     If you need a refill on your cardiac medications before your next appointment, please call your pharmacy.

## 2015-07-10 NOTE — Progress Notes (Signed)
Patient ID: Jakolby D Maita, male   DOB: 08/31/1960, 55 y.o.   MRN: [id number]     Clinical Summary [name] is a 55 y.o.male seen today for follow up of the following medical problems.   1. CAD - hx of BMS to LAD in 2007, followed by stent thrombosis with resulting VF arrest. LAD was restented at that time. - cath 2010 LAD 50% prox, 30% ISR in mid LAD, distal LAD 70-80%, LCX 40% ostial, 70-80% narrowing proximal portion LCX in the ramus Artavia Jeanlouis, RCA 70-80% proximal. LVEF 45%, the distal LAD was stented.  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 Most of the history is per his wife - recent chest pain episodes, mainly with walking. +SOB. Rests and symptoms resolve. Started around Christmas time.  2. HTN - does not check at home - compliant with meds  3. Hyperlipidemia - leg pains on atorvastatin, tolerating simva 20mg daily. 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   Past Medical History  Diagnosis Date  . Cardiomyopathy   . Acute MI, anterior wall   . Ventricular fibrillation   . HTN (hypertension)     unspecified  . Hyperlipidemia, mixed   . Anxiety   . Depression   . Dysphagia, unspecified(787.20)   . History of seizure disorder      Allergies  Allergen Reactions  . Atorvastatin     REACTION: Reaction not known  . Hydromorphone Hcl     REACTION: Reaction not known  . Lorazepam     REACTION: Reaction not known  . Pseudoephedrine     REACTION: Reaction not known  . Terfenadine     REACTION: Reaction not known     Current Outpatient Prescriptions  Medication Sig Dispense Refill  . aspirin 325 MG tablet Take 325 mg by mouth daily. 1/2 tab daily    . carvedilol (COREG) 25 MG tablet Take 25 mg by mouth 2 (two) times daily with a meal.      . clopidogrel (PLAVIX) 75 MG tablet TAKE ONE TABLET BY MOUTH EVERY DAY 30 tablet 0  . FLUoxetine (PROZAC) 20 MG  capsule Take 20 mg by mouth at bedtime.     . fluticasone (FLONASE) 50 MCG/ACT nasal spray Place 1 spray into both nostrils daily.     . furosemide (LASIX) 20 MG tablet Take 20 mg by mouth daily.     . omeprazole (PRILOSEC) 40 MG capsule Take 40 mg by mouth daily.     . simvastatin (ZOCOR) 20 MG tablet Take 20 mg by mouth daily at 6 PM.      No current facility-administered medications for this visit.     Past Surgical History  Procedure Laterality Date  . Stent thrombosis  03/2006    Complicated my sudden cardiac death  . Kidney stone surgery    . Cardiac catheterization  09/2005    with bare metal stent to the mid LAD (Minivision 2.5 x 18 mm) with Provisional balloon angioplasty to the second diagonal.  . Cardiac catheterization  03/2006    with stenting of the mid LAD with Taxus 2.5 x 20 mm and 2.5 x 8 mm stents.      . Bedside tracheostomy      with #6 Shiley. 04/14/2006  . Esophagogastroduodenoscopy    . Peg tube placement       Allergies  Allergen Reactions  .   Atorvastatin     REACTION: Reaction not known  . Hydromorphone Hcl     REACTION: Reaction not known  . Lorazepam     REACTION: Reaction not known  . Pseudoephedrine     REACTION: Reaction not known  . Terfenadine     REACTION: Reaction not known      Family History  Problem Relation Age of Onset  . Heart attack Mother   . Heart attack Father   . Coronary artery disease Neg Hx     Siblings     Social History Mr. Lebo reports that he has never smoked. He does not have any smokeless tobacco history on file. Mr. Marques reports that he does not drink alcohol.   Review of Systems CONSTITUTIONAL: No weight loss, fever, chills, weakness or fatigue.  HEENT: Eyes: No visual loss, blurred vision, double vision or yellow sclerae.No hearing loss, sneezing, congestion, runny nose or sore throat.  SKIN: No rash or itching.  CARDIOVASCULAR: per hpi RESPIRATORY: No shortness of breath, cough or sputum.    GASTROINTESTINAL: No anorexia, nausea, vomiting or diarrhea. No abdominal pain or blood.  GENITOURINARY: No burning on urination, no polyuria NEUROLOGICAL: No headache, dizziness, syncope, paralysis, ataxia, numbness or tingling in the extremities. No change in bowel or bladder control.  MUSCULOSKELETAL: No muscle, back pain, joint pain or stiffness.  LYMPHATICS: No enlarged nodes. No history of splenectomy.  PSYCHIATRIC: No history of depression or anxiety.  ENDOCRINOLOGIC: No reports of sweating, cold or heat intolerance. No polyuria or polydipsia.  .   Physical Examination Filed Vitals:   07/10/15 1056  BP: 112/78  Pulse: 57   Filed Vitals:   07/10/15 1056  Height: 6' 3" (1.905 m)  Weight: 262 lb (118.842 kg)    Gen: resting comfortably, no acute distress HEENT: no scleral icterus, pupils equal round and reactive, no palptable cervical adenopathy,  CV: RRR, no m/r/g, no jvd Resp: Clear to auscultation bilaterally GI: abdomen is soft, non-tender, non-distended, normal bowel sounds, no hepatosplenomegaly MSK: extremities are warm, no edema.  Skin: warm, no rash Neuro:  no focal deficits Psych: appropriate affect   Diagnostic Studies 11/2008 Cath RESULTS: Left main coronary artery: The left main coronary artery was free of significant disease.  Left anterior descending: The left anterior descending artery gave rise to small diagonal Alayzia Pavlock and small septal perforator, and larger diagonal Azuri Bozard. There was 30% narrowing within the midportion of the previously placed stents. Distal to the stent, there was an eccentric lesion, which appeared to be a ruptured plaque, which was estimated to be 70-80% obstructive. There also was 40-50% diffuse narrowing in the proximal LAD.  Circumflex artery: The circumflex artery gave rise to a large ramus Tangela Dolliver and two posterolateral branches and a posterior descending Kadarious Dikes. This was a dominant vessel. There was  70-80% narrowing in the proximal portion in the ramus Tajh Livsey which was extended over about 18 mm. There was also a 40% ostial narrowing in the circumflex artery and 50% narrowing in the ostium of the ramus Ree Alcalde.  Right coronary artery: The right coronary artery was a nondominant vessel which supplied only right ventricular branches. There was a 70- 80% proximal stenosis.  Left ventriculogram: The left ventriculogram was performed in the RAO projection showed hypokinesis of the anterolateral wall and apex. The estimated fraction was 45%.  Following stenting of the lesion in the mid LAD, the stenosis improved from 80% to 0%.  CONCLUSIONS: 1. Coronary artery disease, status post previous percutaneous coronary    interventions as described above with 40% narrowing in the proximal  LAD, 30% narrowing within the stent in the mid LAD, and 80%  narrowing in the mid LAD after the stent, 70-80% narrowing in the  large ramus Lanea Vankirk of the circumflex artery with 40% ostial  stenosis in the dominant circumflex artery, and 80% narrowing in  the proximal portion of a nondominant right coronary artery with  anterolateral wall and apical wall hypokinesis and estimated  ejection fraction of 45%. 2. Successful PCI of the lesion in the mid LAD distal to the  previously placed stent using a XIENCE drug-eluting stent with  improvement in center narrowing from 80% to 0%.  DISPOSITION: The patient returned to post-angio room for further observation.    Jan 2016 echo Study Conclusions  - Left ventricle: The cavity size was normal. Wall thickness was normal. Systolic function was normal. The estimated ejection fraction was in the range of 60% to 65%. Indeterminant diastolic function. - Aortic valve: Mildly calcified annulus. Mildly thickened leaflets. - Mitral valve: Mildly calcified annulus. Mildly thickened  leaflets . - Left atrium: The atrium was mildly dilated. - Systemic veins: IVC is small, suggestive of low RA pressure and hypovolemia. - Technically adequate study.   07/10/15 Clinic EKG (performed and reviewed in clinic): Sinus bradycardia Assessment and Plan   1. CAD - recent chest pain symptoms. We will obtain a lexiscan stress test, he is unable to walk on treadmill due to chronic balance issues.   2 HTN - at goal, continue current meds  3. Hyperlipidemia - continue current statin, will request most recent panel from pcp  F/u pending stress results.   Karis Rilling MD  

## 2015-07-17 ENCOUNTER — Encounter (HOSPITAL_COMMUNITY): Payer: Self-pay

## 2015-07-17 ENCOUNTER — Encounter (HOSPITAL_COMMUNITY)
Admission: RE | Admit: 2015-07-17 | Discharge: 2015-07-17 | Disposition: A | Discharge status: Home / Self Care | Payer: 59 | Source: Ambulatory Visit | Attending: Cardiology | Admitting: Cardiology

## 2015-07-17 ENCOUNTER — Encounter (HOSPITAL_COMMUNITY): Payer: Medicare HMO

## 2015-07-17 DIAGNOSIS — R079 Chest pain, unspecified: Secondary | ICD-10-CM | POA: Insufficient documentation

## 2015-07-17 LAB — NM MYOCAR MULTI W/SPECT W/WALL MOTION / EF
CSEPEW: 1 METS
LHR: 0.28
LVDIAVOL: 106 mL
LVSYSVOL: 61 mL
NUC STRESS TID: 1.13
Peak HR: 95 {beats}/min
Rest HR: 61 {beats}/min
SDS: 6
SRS: 16
SSS: 20

## 2015-07-17 MED ORDER — TECHNETIUM TC 99M SESTAMIBI - CARDIOLITE
30.0000 | Freq: Once | INTRAVENOUS | Status: AC | PRN
  Administered 2015-07-17: 10:00:00 30 via INTRAVENOUS

## 2015-07-17 MED ORDER — TECHNETIUM TC 99M SESTAMIBI GENERIC - CARDIOLITE
10.0000 | Freq: Once | INTRAVENOUS | Status: AC | PRN
  Administered 2015-07-17: 10 via INTRAVENOUS

## 2015-07-17 MED ORDER — SODIUM CHLORIDE 0.9 % IJ SOLN
INTRAMUSCULAR | Status: AC
  Administered 2015-07-17: 10 mL via INTRAVENOUS
  Filled 2015-07-17: qty 3

## 2015-07-17 MED ORDER — REGADENOSON 0.4 MG/5ML IV SOLN
INTRAVENOUS | Status: AC
  Administered 2015-07-17: 0.4 mg via INTRAVENOUS
  Filled 2015-07-17: qty 5

## 2015-07-18 ENCOUNTER — Telehealth: Payer: Self-pay | Admitting: *Deleted

## 2015-07-18 ENCOUNTER — Other Ambulatory Visit: Payer: Self-pay | Admitting: Cardiology

## 2015-07-18 ENCOUNTER — Encounter: Payer: Self-pay | Admitting: *Deleted

## 2015-07-18 DIAGNOSIS — R0789 Other chest pain: Secondary | ICD-10-CM

## 2015-07-18 NOTE — Telephone Encounter (Signed)
-----   Message from Antoine Poche, MD sent at 07/18/2015 11:06 AM EST ----- Stress test is abnormal, suggesting there is a new blockage causing his chest pain. Please update patient and his wife (the patient has signficant memory problems, please make sure wife is aware), and patient will need left heart cath done.  J BrancH MD

## 2015-07-18 NOTE — Telephone Encounter (Signed)
Called patient with test results. No answer. Left message to call back.  

## 2015-07-20 ENCOUNTER — Encounter (HOSPITAL_COMMUNITY): Payer: Self-pay | Admitting: Interventional Cardiology

## 2015-07-20 ENCOUNTER — Encounter (HOSPITAL_COMMUNITY)
Admission: RE | Disposition: A | Discharge status: Home / Self Care | Payer: Self-pay | Source: Ambulatory Visit | Attending: Interventional Cardiology

## 2015-07-20 ENCOUNTER — Ambulatory Visit (HOSPITAL_COMMUNITY)
Admission: RE | Admit: 2015-07-20 | Discharge: 2015-07-21 | Disposition: A | Discharge status: Home / Self Care | Payer: 59 | Source: Ambulatory Visit | Attending: Interventional Cardiology | Admitting: Interventional Cardiology

## 2015-07-20 ENCOUNTER — Other Ambulatory Visit: Payer: Self-pay

## 2015-07-20 DIAGNOSIS — I200 Unstable angina: Secondary | ICD-10-CM | POA: Insufficient documentation

## 2015-07-20 DIAGNOSIS — E785 Hyperlipidemia, unspecified: Secondary | ICD-10-CM | POA: Diagnosis present

## 2015-07-20 DIAGNOSIS — I119 Hypertensive heart disease without heart failure: Secondary | ICD-10-CM | POA: Diagnosis present

## 2015-07-20 DIAGNOSIS — G931 Anoxic brain damage, not elsewhere classified: Secondary | ICD-10-CM | POA: Diagnosis present

## 2015-07-20 DIAGNOSIS — Z7902 Long term (current) use of antithrombotics/antiplatelets: Secondary | ICD-10-CM | POA: Insufficient documentation

## 2015-07-20 DIAGNOSIS — Z7982 Long term (current) use of aspirin: Secondary | ICD-10-CM | POA: Insufficient documentation

## 2015-07-20 DIAGNOSIS — I252 Old myocardial infarction: Secondary | ICD-10-CM | POA: Diagnosis not present

## 2015-07-20 DIAGNOSIS — Z955 Presence of coronary angioplasty implant and graft: Secondary | ICD-10-CM | POA: Insufficient documentation

## 2015-07-20 DIAGNOSIS — Y838 Other surgical procedures as the cause of abnormal reaction of the patient, or of later complication, without mention of misadventure at the time of the procedure: Secondary | ICD-10-CM | POA: Insufficient documentation

## 2015-07-20 DIAGNOSIS — I2510 Atherosclerotic heart disease of native coronary artery without angina pectoris: Secondary | ICD-10-CM | POA: Diagnosis present

## 2015-07-20 DIAGNOSIS — I25119 Atherosclerotic heart disease of native coronary artery with unspecified angina pectoris: Secondary | ICD-10-CM

## 2015-07-20 DIAGNOSIS — R413 Other amnesia: Secondary | ICD-10-CM | POA: Insufficient documentation

## 2015-07-20 DIAGNOSIS — F32A Depression, unspecified: Secondary | ICD-10-CM | POA: Diagnosis present

## 2015-07-20 DIAGNOSIS — T82855A Stenosis of coronary artery stent, initial encounter: Secondary | ICD-10-CM | POA: Diagnosis not present

## 2015-07-20 DIAGNOSIS — F329 Major depressive disorder, single episode, unspecified: Secondary | ICD-10-CM | POA: Diagnosis not present

## 2015-07-20 DIAGNOSIS — I25110 Atherosclerotic heart disease of native coronary artery with unstable angina pectoris: Secondary | ICD-10-CM | POA: Insufficient documentation

## 2015-07-20 DIAGNOSIS — E782 Mixed hyperlipidemia: Secondary | ICD-10-CM | POA: Insufficient documentation

## 2015-07-20 DIAGNOSIS — F419 Anxiety disorder, unspecified: Secondary | ICD-10-CM

## 2015-07-20 DIAGNOSIS — Z8674 Personal history of sudden cardiac arrest: Secondary | ICD-10-CM | POA: Insufficient documentation

## 2015-07-20 DIAGNOSIS — I209 Angina pectoris, unspecified: Secondary | ICD-10-CM | POA: Insufficient documentation

## 2015-07-20 DIAGNOSIS — R0789 Other chest pain: Secondary | ICD-10-CM

## 2015-07-20 HISTORY — PX: CARDIAC CATHETERIZATION: SHX172

## 2015-07-20 HISTORY — DX: Anoxic brain damage, not elsewhere classified: G93.1

## 2015-07-20 HISTORY — DX: Atherosclerotic heart disease of native coronary artery without angina pectoris: I25.10

## 2015-07-20 HISTORY — PX: CORONARY STENT PLACEMENT: SHX1402

## 2015-07-20 HISTORY — DX: Other amnesia: R41.3

## 2015-07-20 HISTORY — DX: Gastro-esophageal reflux disease without esophagitis: K21.9

## 2015-07-20 HISTORY — DX: Hypertensive heart disease without heart failure: I11.9

## 2015-07-20 HISTORY — DX: Ischemic cardiomyopathy: I25.5

## 2015-07-20 LAB — BASIC METABOLIC PANEL
ANION GAP: 12 (ref 5–15)
BUN: 9 mg/dL (ref 6–20)
CHLORIDE: 101 mmol/L (ref 101–111)
CO2: 26 mmol/L (ref 22–32)
Calcium: 9.1 mg/dL (ref 8.9–10.3)
Creatinine, Ser: 1.01 mg/dL (ref 0.61–1.24)
GFR calc non Af Amer: >60 mL/min (ref >60)
Glucose, Bld: 109 mg/dL — ABNORMAL HIGH (ref 65–99)
Potassium: 4.1 mmol/L (ref 3.5–5.1)
Sodium: 139 mmol/L (ref 135–145)

## 2015-07-20 LAB — CBC
HEMATOCRIT: 41 % (ref 39.0–52.0)
HEMOGLOBIN: 14 g/dL (ref 13.0–17.0)
MCH: 28.9 pg (ref 26.0–34.0)
MCHC: 34.1 g/dL (ref 30.0–36.0)
MCV: 84.7 fL (ref 78.0–100.0)
Platelets: 223 10*3/uL (ref 150–400)
RBC: 4.84 MIL/uL (ref 4.22–5.81)
RDW: 12.5 % (ref 11.5–15.5)
WBC: 6.1 10*3/uL (ref 4.0–10.5)

## 2015-07-20 LAB — PROTIME-INR
INR: 1.05 (ref 0.00–1.49)
Prothrombin Time: 13.9 seconds (ref 11.6–15.2)

## 2015-07-20 SURGERY — LEFT HEART CATH AND CORONARY ANGIOGRAPHY
Anesthesia: LOCAL

## 2015-07-20 MED ORDER — FLUOXETINE HCL 20 MG PO CAPS
20.0000 mg | ORAL_CAPSULE | Freq: Every day | ORAL | Status: DC
  Administered 2015-07-20: 20:00:00 20 mg via ORAL
  Filled 2015-07-20: qty 1

## 2015-07-20 MED ORDER — HEPARIN SODIUM (PORCINE) 1000 UNIT/ML IJ SOLN
INTRAMUSCULAR | Status: AC
  Filled 2015-07-20: qty 1

## 2015-07-20 MED ORDER — SODIUM CHLORIDE 0.9 % IJ SOLN
3.0000 mL | Freq: Two times a day (BID) | INTRAMUSCULAR | Status: DC
  Administered 2015-07-20: 3 mL via INTRAVENOUS

## 2015-07-20 MED ORDER — SODIUM CHLORIDE 0.9 % IV SOLN: 250.0000 mL | INTRAVENOUS | Status: DC | PRN

## 2015-07-20 MED ORDER — SODIUM CHLORIDE 0.9 % IJ SOLN
3.0000 mL | INTRAMUSCULAR | Status: DC | PRN
Start: 2015-07-20 — End: 2015-07-21

## 2015-07-20 MED ORDER — LORATADINE 10 MG PO TABS: 10.0000 mg | ORAL_TABLET | Freq: Every day | ORAL | Status: DC

## 2015-07-20 MED ORDER — CLOPIDOGREL BISULFATE 75 MG PO TABS: 75.0000 mg | ORAL_TABLET | Freq: Every day | ORAL | Status: DC

## 2015-07-20 MED ORDER — CLOPIDOGREL BISULFATE 300 MG PO TABS
ORAL_TABLET | ORAL | Status: DC | PRN
  Administered 2015-07-20: 300 mg via ORAL

## 2015-07-20 MED ORDER — SODIUM CHLORIDE 0.9 % IJ SOLN: 3.0000 mL | INTRAMUSCULAR | Status: DC | PRN

## 2015-07-20 MED ORDER — ACETAMINOPHEN 325 MG PO TABS: 650.0000 mg | ORAL_TABLET | ORAL | Status: DC | PRN

## 2015-07-20 MED ORDER — ASPIRIN 81 MG PO CHEW
81.0000 mg | CHEWABLE_TABLET | ORAL | Status: DC
Start: 2015-07-20 — End: 2015-07-20

## 2015-07-20 MED ORDER — ASPIRIN 81 MG PO CHEW: 81.0000 mg | CHEWABLE_TABLET | Freq: Every day | ORAL | Status: DC

## 2015-07-20 MED ORDER — IOHEXOL 350 MG/ML SOLN
INTRAVENOUS | Status: DC | PRN
  Administered 2015-07-20: 100 mL via INTRA_ARTERIAL

## 2015-07-20 MED ORDER — SODIUM CHLORIDE 0.9 % WEIGHT BASED INFUSION: 3.0000 mL/kg/h | INTRAVENOUS | Status: DC

## 2015-07-20 MED ORDER — FLUTICASONE PROPIONATE 50 MCG/ACT NA SUSP: 1.0000 | Freq: Every day | NASAL | Status: DC | PRN

## 2015-07-20 MED ORDER — SODIUM CHLORIDE 0.9 % WEIGHT BASED INFUSION
1.0000 mL/kg/h | INTRAVENOUS | Status: DC
Start: 2015-07-21 — End: 2015-07-20

## 2015-07-20 MED ORDER — VERAPAMIL HCL 2.5 MG/ML IV SOLN
INTRAVENOUS | Status: DC | PRN
  Administered 2015-07-20: 09:00:00 via INTRA_ARTERIAL

## 2015-07-20 MED ORDER — HEPARIN SODIUM (PORCINE) 1000 UNIT/ML IJ SOLN
INTRAMUSCULAR | Status: DC | PRN
  Administered 2015-07-20: 5000 [IU] via INTRAVENOUS
  Administered 2015-07-20: 6000 [IU] via INTRAVENOUS
  Administered 2015-07-20: 2000 [IU] via INTRAVENOUS

## 2015-07-20 MED ORDER — PANTOPRAZOLE SODIUM 40 MG PO TBEC
40.0000 mg | DELAYED_RELEASE_TABLET | Freq: Every day | ORAL | Status: DC
Start: 2015-07-20 — End: 2015-07-21
  Administered 2015-07-20: 40 mg via ORAL
  Filled 2015-07-20: qty 1

## 2015-07-20 MED ORDER — SODIUM CHLORIDE 0.9 % IJ SOLN: 3.0000 mL | Freq: Two times a day (BID) | INTRAMUSCULAR | Status: DC

## 2015-07-20 MED ORDER — SODIUM CHLORIDE 0.9 % WEIGHT BASED INFUSION: 1.0000 mL/kg/h | INTRAVENOUS | Status: AC

## 2015-07-20 MED ORDER — CLOPIDOGREL BISULFATE 75 MG PO TABS
75.0000 mg | ORAL_TABLET | Freq: Every day | ORAL | Status: DC
  Administered 2015-07-21: 08:00:00 75 mg via ORAL
  Filled 2015-07-20: qty 1

## 2015-07-20 MED ORDER — SIMVASTATIN 20 MG PO TABS
20.0000 mg | ORAL_TABLET | Freq: Every day | ORAL | Status: DC
  Administered 2015-07-20: 19:00:00 20 mg via ORAL
  Filled 2015-07-20: qty 1

## 2015-07-20 MED ORDER — LIDOCAINE HCL (PF) 1 % IJ SOLN
INTRAMUSCULAR | Status: DC | PRN
  Administered 2015-07-20: 11:00:00

## 2015-07-20 MED ORDER — ONDANSETRON HCL 4 MG/2ML IJ SOLN: 4.0000 mg | Freq: Four times a day (QID) | INTRAMUSCULAR | Status: DC | PRN

## 2015-07-20 MED ORDER — LIDOCAINE HCL (PF) 1 % IJ SOLN
INTRAMUSCULAR | Status: AC
  Filled 2015-07-20: qty 30

## 2015-07-20 MED ORDER — ANGIOPLASTY BOOK
Freq: Once | Status: AC
  Administered 2015-07-20: 19:00:00
  Filled 2015-07-20: qty 1

## 2015-07-20 MED ORDER — CLOPIDOGREL BISULFATE 300 MG PO TABS
ORAL_TABLET | ORAL | Status: AC
  Filled 2015-07-20: qty 1

## 2015-07-20 MED ORDER — HEPARIN (PORCINE) IN NACL 2-0.9 UNIT/ML-% IJ SOLN
INTRAMUSCULAR | Status: AC
  Filled 2015-07-20: qty 1000

## 2015-07-20 MED ORDER — CARVEDILOL 12.5 MG PO TABS
25.0000 mg | ORAL_TABLET | Freq: Two times a day (BID) | ORAL | Status: DC
  Administered 2015-07-20 – 2015-07-21 (×2): 25 mg via ORAL
  Filled 2015-07-20 (×2): qty 2

## 2015-07-20 MED ORDER — VERAPAMIL HCL 2.5 MG/ML IV SOLN
INTRAVENOUS | Status: AC
  Filled 2015-07-20: qty 2

## 2015-07-20 MED ORDER — ASPIRIN EC 81 MG PO TBEC: 81.0000 mg | DELAYED_RELEASE_TABLET | Freq: Every day | ORAL | Status: DC

## 2015-07-20 SURGICAL SUPPLY — 22 items
BALLN ANGIOSCULPT RX 2.5X10 (BALLOONS) ×2
BALLN ~~LOC~~ EUPHORA RX 3.0X15 (BALLOONS) ×2
BALLOON ANGIOSCULPT RX 2.5X10 (BALLOONS) ×1 IMPLANT
BALLOON ~~LOC~~ EUPHORA RX 3.0X15 (BALLOONS) ×1 IMPLANT
CATH IMPULSE 5F ANG/FL3.5 (CATHETERS) ×2 IMPLANT
CATH LAUNCHER 5F EBU3.0 (CATHETERS) ×1 IMPLANT
CATH LAUNCHER 6FR AL1 (CATHETERS) ×1 IMPLANT
CATHETER LAUNCHER 5F EBU3.0 (CATHETERS) ×2
CATHETER LAUNCHER 6FR AL1 (CATHETERS) ×2
CUTTING BAL FLEXTOME RX 2.5X10 (BALLOONS) ×2 IMPLANT
DEVICE RAD COMP TR BAND LRG (VASCULAR PRODUCTS) ×2 IMPLANT
GLIDESHEATH SLEND SS 6F .021 (SHEATH) ×2 IMPLANT
KIT ENCORE 26 ADVANTAGE (KITS) ×2 IMPLANT
KIT HEART LEFT (KITS) ×2 IMPLANT
PACK CARDIAC CATHETERIZATION (CUSTOM PROCEDURE TRAY) ×2 IMPLANT
STENT SYNERGY DES 2.5X28 (Permanent Stent) ×2 IMPLANT
SYR MEDRAD MARK V 150ML (SYRINGE) ×2 IMPLANT
TRANSDUCER W/STOPCOCK (MISCELLANEOUS) ×2 IMPLANT
TUBING CIL FLEX 10 FLL-RA (TUBING) ×2 IMPLANT
VALVE GUARDIAN II ~~LOC~~ HEMO (MISCELLANEOUS) ×2 IMPLANT
WIRE ASAHI PROWATER 180CM (WIRE) ×2 IMPLANT
WIRE SAFE-T 1.5MM-J .035X260CM (WIRE) ×2 IMPLANT

## 2015-07-20 NOTE — H&P (View-Only) (Signed)
Patient ID: [name] Cummings, male   DOB: December 19, 1960, 55 y.o.   MRN: [id number]     Clinical Summary [name] is a 55 y.o.male seen today for follow up of the following medical problems.   1. CAD - hx of BMS to LAD in 2007, followed by stent thrombosis with resulting VF arrest. LAD was restented at that time. - cath 2010 LAD 50% prox, 30% ISR in mid LAD, distal LAD 70-80%, LCX 40% ostial, 70-80% narrowing proximal portion LCX in the ramus branch, RCA 70-80% proximal. LVEF 45%, the distal LAD was stented.  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 Most of the history is per his wife - recent chest pain episodes, mainly with walking. +SOB. Rests and symptoms resolve. Started around Christmas time.  2. HTN - does not check at home - compliant with meds  3. Hyperlipidemia - leg pains on atorvastatin, tolerating simva  daily. 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   Past Medical History  Diagnosis Date  . Cardiomyopathy   . Acute MI, anterior wall   . Ventricular fibrillation   . HTN (hypertension)     unspecified  . Hyperlipidemia, mixed   . Anxiety   . Depression   . Dysphagia, unspecified(787.20)   . History of seizure disorder      Allergies  Allergen Reactions  . Atorvastatin     REACTION: Reaction not known  . Hydromorphone Hcl     REACTION: Reaction not known  . Lorazepam     REACTION: Reaction not known  . Pseudoephedrine     REACTION: Reaction not known  . Terfenadine     REACTION: Reaction not known     Current Outpatient Prescriptions  Medication Sig Dispense Refill  . aspirin 325 MG tablet Take 325 mg by mouth daily. 1/2 tab daily    . carvedilol (COREG) 25 MG tablet Take 25 mg by mouth 2 (two) times daily with a meal.      . clopidogrel (PLAVIX) 75 MG tablet TAKE ONE TABLET BY MOUTH EVERY DAY 30 tablet 0  . FLUoxetine (PROZAC) 20 MG  capsule Take 20 mg by mouth at bedtime.     . fluticasone (FLONASE) 50 MCG/ACT nasal spray Place 1 spray into both nostrils daily.     . furosemide (LASIX) 20 MG tablet Take 20 mg by mouth daily.     Marland Kitchen omeprazole (PRILOSEC) 40 MG capsule Take 40 mg by mouth daily.     . simvastatin (ZOCOR) 20 MG tablet Take 20 mg by mouth daily at 6 PM.      No current facility-administered medications for this visit.     Past Surgical History  Procedure Laterality Date  . Stent thrombosis  03/2006    Complicated my sudden cardiac death  . Kidney stone surgery    . Cardiac catheterization  09/2005    with bare metal stent to the mid LAD (Minivision 2.5 x 18 mm) with Provisional balloon angioplasty to the second diagonal.  . Cardiac catheterization  03/2006    with stenting of the mid LAD with Taxus 2.5 x 20 mm and 2.5 x 8 mm stents.      . Bedside tracheostomy      with #6 Shiley. 04/14/2006  . Esophagogastroduodenoscopy    . Peg tube placement       Allergies  Allergen Reactions  .  Atorvastatin     REACTION: Reaction not known  . Hydromorphone Hcl     REACTION: Reaction not known  . Lorazepam     REACTION: Reaction not known  . Pseudoephedrine     REACTION: Reaction not known  . Terfenadine     REACTION: Reaction not known      Family History  Problem Relation Age of Onset  . Heart attack Mother   . Heart attack Father   . Coronary artery disease Neg Hx     Siblings     Social History [name] reports that he has never smoked. He does not have any smokeless tobacco history on file. [name] reports that he does not drink alcohol.   Review of Systems CONSTITUTIONAL: No weight loss, fever, chills, weakness or fatigue.  HEENT: Eyes: No visual loss, blurred vision, double vision or yellow sclerae.No hearing loss, sneezing, congestion, runny nose or sore throat.  SKIN: No rash or itching.  CARDIOVASCULAR: per hpi RESPIRATORY: No shortness of breath, cough or sputum.    GASTROINTESTINAL: No anorexia, nausea, vomiting or diarrhea. No abdominal pain or blood.  GENITOURINARY: No burning on urination, no polyuria NEUROLOGICAL: No headache, dizziness, syncope, paralysis, ataxia, numbness or tingling in the extremities. No change in bowel or bladder control.  MUSCULOSKELETAL: No muscle, back pain, joint pain or stiffness.  LYMPHATICS: No enlarged nodes. No history of splenectomy.  PSYCHIATRIC: No history of depression or anxiety.  ENDOCRINOLOGIC: No reports of sweating, cold or heat intolerance. No polyuria or polydipsia.  Marland Kitchen   Physical Examination Filed Vitals:   07/10/15 1056  BP: 112/78  Pulse: 57   Filed Vitals:   07/10/15 1056  Height:  (1.905 m)  Weight: 262 lb (118.842 kg)    Gen: resting comfortably, no acute distress HEENT: no scleral icterus, pupils equal round and reactive, no palptable cervical adenopathy,  CV: RRR, no m/r/g, no jvd Resp: Clear to auscultation bilaterally GI: abdomen is soft, non-tender, non-distended, normal bowel sounds, no hepatosplenomegaly MSK: extremities are warm, no edema.  Skin: warm, no rash Neuro:  no focal deficits Psych: appropriate affect   Diagnostic Studies 11/2008 Cath RESULTS: Left main coronary artery: The left main coronary artery was free of significant disease.  Left anterior descending: The left anterior descending artery gave rise to small diagonal branch and small septal perforator, and larger diagonal branch. There was 30% narrowing within the midportion of the previously placed stents. Distal to the stent, there was an eccentric lesion, which appeared to be a ruptured plaque, which was estimated to be 70-80% obstructive. There also was 40-50% diffuse narrowing in the proximal LAD.  Circumflex artery: The circumflex artery gave rise to a large ramus branch and two posterolateral branches and a posterior descending branch. This was a dominant vessel. There was  70-80% narrowing in the proximal portion in the ramus branch which was extended over about 18 mm. There was also a 40% ostial narrowing in the circumflex artery and 50% narrowing in the ostium of the ramus branch.  Right coronary artery: The right coronary artery was a nondominant vessel which supplied only right ventricular branches. There was a 70- 80% proximal stenosis.  Left ventriculogram: The left ventriculogram was performed in the RAO projection showed hypokinesis of the anterolateral wall and apex. The estimated fraction was 45%.  Following stenting of the lesion in the mid LAD, the stenosis improved from 80% to 0%.  CONCLUSIONS: 1. Coronary artery disease, status post previous percutaneous coronary  interventions as described above with 40% narrowing in the proximal  LAD, 30% narrowing within the stent in the mid LAD, and 80%  narrowing in the mid LAD after the stent, 70-80% narrowing in the  large ramus branch of the circumflex artery with 40% ostial  stenosis in the dominant circumflex artery, and 80% narrowing in  the proximal portion of a nondominant right coronary artery with  anterolateral wall and apical wall hypokinesis and estimated  ejection fraction of 45%. 2. Successful PCI of the lesion in the mid LAD distal to the  previously placed stent using a XIENCE drug-eluting stent with  improvement in center narrowing from 80% to 0%.  [facility]    Jan 2016 echo Study Conclusions  - Left ventricle: The cavity size was normal. Wall thickness was normal. Systolic function was normal. The estimated ejection fraction was in the range of 60% to 65%. Indeterminant diastolic function. - Aortic valve: Mildly calcified annulus. Mildly thickened leaflets. - Mitral valve: Mildly calcified annulus. Mildly thickened  leaflets . - Left atrium: The atrium was mildly dilated. - Systemic veins: IVC is small, suggestive of low RA pressure and hypovolemia. - Technically adequate study.   07/10/15 Clinic EKG (performed and reviewed in clinic): Sinus bradycardia Assessment and Plan   1. CAD - recent chest pain symptoms. We will obtain a lexiscan stress test, he is unable to walk on treadmill due to chronic balance issues.   2 HTN - at goal, continue current meds  3. Hyperlipidemia - continue current statin, will request most recent panel from pcp  F/u pending stress results.   Dina RichJonathan Branch MD

## 2015-07-20 NOTE — Progress Notes (Signed)
TR BAND REMOVAL  LOCATION:    right radial  DEFLATED PER PROTOCOL:    Yes.    TIME BAND OFF / DRESSING APPLIED:    1445   SITE UPON ARRIVAL:    Level 0  SITE AFTER BAND REMOVAL:    Level 0  CIRCULATION SENSATION AND MOVEMENT:    Within Normal Limits   Yes.    COMMENTS:   Tolerated procedure well 

## 2015-07-20 NOTE — Interval H&P Note (Signed)
Cath Lab Visit (complete for each Cath Lab visit)  Clinical Evaluation Leading to the Procedure:   ACS: No.  Non-ACS:    Anginal Classification: CCS III  Anti-ischemic medical therapy: Minimal Therapy (1 class of medications)  Non-Invasive Test Results: No non-invasive testing performed  Prior CABG: No previous CABG      History and Physical Interval Note:  07/20/2015 9:14 AM  Gus Puma  has presented today for surgery, with the diagnosis of abnormal nuclear stress test  The various methods of treatment have been discussed with the patient and family. After consideration of risks, benefits and other options for treatment, the patient has consented to  Procedure(s): Left Heart Cath and Coronary Angiography (N/A) as a surgical intervention .  The patient's history has been reviewed, patient examined, no change in status, stable for surgery.  I have reviewed the patient's chart and labs.  Questions were answered to the patient's satisfaction.     Ludger Bones S.

## 2015-07-21 ENCOUNTER — Encounter (HOSPITAL_COMMUNITY): Payer: Self-pay | Admitting: Nurse Practitioner

## 2015-07-21 ENCOUNTER — Other Ambulatory Visit: Payer: Self-pay

## 2015-07-21 DIAGNOSIS — I2510 Atherosclerotic heart disease of native coronary artery without angina pectoris: Secondary | ICD-10-CM | POA: Diagnosis present

## 2015-07-21 DIAGNOSIS — I119 Hypertensive heart disease without heart failure: Secondary | ICD-10-CM | POA: Diagnosis present

## 2015-07-21 DIAGNOSIS — T82855A Stenosis of coronary artery stent, initial encounter: Secondary | ICD-10-CM | POA: Diagnosis not present

## 2015-07-21 DIAGNOSIS — E782 Mixed hyperlipidemia: Secondary | ICD-10-CM | POA: Diagnosis present

## 2015-07-21 DIAGNOSIS — I25110 Atherosclerotic heart disease of native coronary artery with unstable angina pectoris: Secondary | ICD-10-CM | POA: Diagnosis not present

## 2015-07-21 DIAGNOSIS — Z8674 Personal history of sudden cardiac arrest: Secondary | ICD-10-CM | POA: Diagnosis not present

## 2015-07-21 DIAGNOSIS — F419 Anxiety disorder, unspecified: Secondary | ICD-10-CM

## 2015-07-21 DIAGNOSIS — I200 Unstable angina: Secondary | ICD-10-CM

## 2015-07-21 DIAGNOSIS — G931 Anoxic brain damage, not elsewhere classified: Secondary | ICD-10-CM | POA: Diagnosis present

## 2015-07-21 DIAGNOSIS — R413 Other amnesia: Secondary | ICD-10-CM | POA: Diagnosis not present

## 2015-07-21 DIAGNOSIS — E785 Hyperlipidemia, unspecified: Secondary | ICD-10-CM | POA: Diagnosis present

## 2015-07-21 LAB — CBC
HCT: 39.8 % (ref 39.0–52.0)
HEMOGLOBIN: 12.9 g/dL — AB (ref 13.0–17.0)
MCH: 27.7 pg (ref 26.0–34.0)
MCHC: 32.4 g/dL (ref 30.0–36.0)
MCV: 85.4 fL (ref 78.0–100.0)
Platelets: 215 10*3/uL (ref 150–400)
RBC: 4.66 MIL/uL (ref 4.22–5.81)
RDW: 12.5 % (ref 11.5–15.5)
WBC: 6.5 10*3/uL (ref 4.0–10.5)

## 2015-07-21 LAB — POCT ACTIVATED CLOTTING TIME
ACTIVATED CLOTTING TIME: 271 s
ACTIVATED CLOTTING TIME: 296 s

## 2015-07-21 LAB — BASIC METABOLIC PANEL
ANION GAP: 10 (ref 5–15)
BUN: 12 mg/dL (ref 6–20)
CALCIUM: 8.7 mg/dL — AB (ref 8.9–10.3)
CO2: 22 mmol/L (ref 22–32)
CREATININE: 1.11 mg/dL (ref 0.61–1.24)
Chloride: 105 mmol/L (ref 101–111)
Glucose, Bld: 128 mg/dL — ABNORMAL HIGH (ref 65–99)
Potassium: 4.1 mmol/L (ref 3.5–5.1)
SODIUM: 137 mmol/L (ref 135–145)

## 2015-07-21 MED ORDER — CLOPIDOGREL BISULFATE 75 MG PO TABS: 75.0000 mg | ORAL_TABLET | Freq: Every day | ORAL | Status: DC

## 2015-07-21 MED ORDER — PANTOPRAZOLE SODIUM 40 MG PO TBEC: 40.0000 mg | DELAYED_RELEASE_TABLET | Freq: Every day | ORAL | Status: DC

## 2015-07-21 MED ORDER — NITROGLYCERIN 0.4 MG SL SUBL: 0.4000 mg | SUBLINGUAL_TABLET | SUBLINGUAL | Status: DC | PRN

## 2015-07-21 NOTE — Progress Notes (Signed)
CARDIAC REHAB PHASE I   PRE:  Rate/Rhythm: 66 SR  BP:  Supine: 130/59  Sitting:   Standing:    SaO2: 95%RA  MODE:  Ambulation: 500 ft   POST:  Rate/Rhythm: 85 SR  BP:  Supine:   Sitting: 152/83  Standing:    SaO2:  0905-1005 Pt walked 500 ft with steady gait. Talked whole walk. No CP. Pt asked constantly if he had a stent. Education completed with wife since pt has short term memory loss. Stressed importance of plavix with stent. Reviewed NTG use and heart healthy food choices. Gave ex ed. Discussed CRP 2 and will refer to [place]. Wife works but stated daughter might be able to get him to program.   Luetta Nutting, RN BSN  07/21/2015 10:01 AM

## 2015-07-21 NOTE — Discharge Summary (Signed)
Discharge Summary    Patient ID: [name] Cummings,  MRN: [id number], DOB/AGE: Oct 25, 1960 55 y.o.  Admit date: 07/20/2015 Discharge date: 07/21/2015  Primary Care Provider: Marjory Lies A Primary Cardiologist: Dominga Ferry, MD   Discharge Diagnoses    Principal Problem:   Unstable angina [facility]) Active Problems:   CAD (coronary artery disease)   Hypertensive heart disease   Hyperlipidemia, mixed   Anxiety   Depression   Brain anoxic injury (HCC)   Allergies Allergies  Allergen Reactions  . Atorvastatin     REACTION: Reaction not known  . Hydromorphone Hcl     REACTION: Reaction not known  . Lorazepam     REACTION: Reaction not known  . Pseudoephedrine     REACTION: Reaction not known  . Terfenadine     REACTION: Reaction not known    Diagnostic Studies/Procedures    Cardiac Catheterization and Percutaneous Coronary Intervention 1.20.2017  Coronary Findings     Dominance: Left    Left Anterior Descending   . Mid LAD lesion, 80% stenosed. Diffuse. The lesion was previously treated with a bare metal stent greater than two years ago.   Marland Kitchen PCI: The mid LAD was successfully treated using a 2.5 x  28 Synergy DES.        Left Circumflex  The vessel exhibits minimal luminal irregularities.   . First Obtuse Marginal Branch   . 1st Mrg lesion, 99% stenosed.   . Lateral First Obtuse Marginal Branch   Lat 1st Mrg filled by collaterals from 3rd Sept.      Right Coronary Artery   . Prox RCA lesion, 50% stenosed.    _____________   History of Present Illness     55 year old male with a prior history of coronary artery disease status post anterior MI and ventricular fibrillation arrest in 2007 requiring stenting of the LAD. As a result of this event, he has suffered an anoxic brain injury and has significant memory loss. He recently was seen in clinic by Dr. Wyline Mood and reported episodic chest pain.  He underwent stress testing which revealed a large partially reversible  anterolateral defect. Decision was made to pursue diagnostic catheterization.  Hospital Course     Consultants: none   Patient presented to the [facility] diagnostic cardiac catheterization laboratory on January 20, and underwent diagnostic catheterization. This revealed severe in-stent restenosis within the pre-study placed bare metal stent in the LAD. He also had significant OM1 disease as well as moderate right coronary artery disease. The LAD stenosis was felt to be the culprit for his symptoms and was successfully treated with drug-eluting stent as outlined above. He tolerated the procedure well and postprocedure has been doing without difficulty or recurrent symptoms. He will be discharged home today in good condition. ______  Discharge Vitals Blood pressure 126/68, pulse 59, temperature 97.9 F (36.6 C), temperature source Oral, resp. rate 16, height  (1.905 m), weight 261 lb 7.5 oz (118.6 kg), SpO2 98 %.  Filed Weights   07/20/15 0717 07/21/15 0456  Weight: 266 lb (120.657 kg) 261 lb 7.5 oz (118.6 kg)    Labs & Radiologic Studies     CBC  Recent Labs  07/20/15 0757 07/21/15 0546  WBC 6.1 6.5  HGB 14.0 12.9*  HCT 41.0 39.8  MCV 84.7 85.4  PLT 223 215   Basic Metabolic Panel  Recent Labs  07/20/15 0757 07/21/15 0546  NA 139 137  K 4.1 4.1  CL 101 105  CO2  26 22  GLUCOSE 109* 128*  BUN 9 12  CREATININE 1.01 1.11  CALCIUM 9.1 8.7*    Nm Myocar Multi W/spect W/wall Motion / Ef  07/17/2015   No diagnostic ST segment changes to indicate ischemia.  Large, moderate intensity, partially reversible anterolateral defect consistent with scar and at least moderate peri-infarct ischemia.  This is a high risk study.  Nuclear stress EF: 43%. Anterolateral hypokinesis noted.     Disposition   Pt is being discharged home today in good condition.  Follow-up Plans & Appointments    Follow-up Information    Follow up with Dina Rich, MD In 2 weeks.    Specialty:  Cardiology   Why:  we will arrange and contact you.   Contact information:   [address] [place] [address] [phone]        Discharge Medications   Current Discharge Medication List    START taking these medications   Details  nitroGLYCERIN (NITROSTAT) 0.4 MG SL tablet Place 1 tablet (0.4 mg total) under the tongue every 5 (five) minutes as needed for chest pain. Qty: 25 tablet, Refills: 3    pantoprazole (PROTONIX) 40 MG tablet Take 1 tablet (40 mg total) by mouth daily. Qty: 30 tablet, Refills: 6      CONTINUE these medications which have CHANGED   Details  clopidogrel (PLAVIX) 75 MG tablet Take 1 tablet (75 mg total) by mouth daily. Qty: 30 tablet, Refills: 6      CONTINUE these medications which have NOT CHANGED   Details  aspirin EC 81 MG tablet Take 81 mg by mouth daily.    carvedilol (COREG) 25 MG tablet Take 25 mg by mouth 2 (two) times daily with a meal.      FLUoxetine (PROZAC) 20 MG capsule Take 20 mg by mouth at bedtime.     fluticasone (FLONASE) 50 MCG/ACT nasal spray Place 1 spray into both nostrils daily as needed for allergies.     furosemide (LASIX) 20 MG tablet Take 20 mg by mouth daily.     loratadine (CLARITIN) 10 MG tablet Take 10 mg by mouth daily.    simvastatin (ZOCOR) 20 MG tablet Take 20 mg by mouth daily at 6 PM.       STOP taking these medications     omeprazole (PRILOSEC) 40 MG capsule          Aspirin prescribed at discharge?  Yes High Intensity Statin Prescribed? (Lipitor 40-80mg  or Crestor 20-40mg ): Yes Beta Blocker Prescribed? Yes For EF 45% or less, Was ACEI/ARB Prescribed? No: N/A ADP Receptor Inhibitor Prescribed? (i.e. Plavix etc.-Includes Medically Managed Patients): Yes For EF <40%, Aldosterone Inhibitor Prescribed? No: N/A Was EF assessed during THIS hospitalization? No: Nl EF 06/2014. Was Cardiac Rehab II ordered? (Included Medically managed Patients): Yes   Outstanding Labs/Studies    None  Duration of Discharge Encounter   Greater than 30 minutes including physician time.  Signed, Nicolasa Ducking NP 07/21/2015, 9:05 AM  I have examined the patient and reviewed assessment and plan and discussed with patient.  Agree with above as stated.  Status post LAD PCI. I spoke to his wife. She understands the importance of dual antiplatelet therapy. She handles all of the patient's medicines due to his memory loss.  He needs aggressive risk factor modification including lipid-lowering therapy, diet control, weight loss. He will follow-up with Dr. Wyline Mood.  Ciena Sampley S.

## 2015-07-21 NOTE — Progress Notes (Signed)
   SUBJECTIVE:  No chest pain.  No recollection of yesterday.  OBJECTIVE:   Vitals:   Filed Vitals:   07/20/15 1925 07/20/15 2000 07/21/15 0456 07/21/15 0747  BP: 130/72 122/70 116/74 126/68  Pulse: 63 62 64 59  Temp: 98.1 F (36.7 C)  97.8 F (36.6 C) 97.9 F (36.6 C)  TempSrc: Oral  Oral Oral  Resp: Height:      Weight:   261 lb 7.5 oz (118.6 kg)   SpO2: 97% 97% 96% 98%   I&O's:   Intake/Output Summary (Last 24 hours) at 07/21/15 0814 Last data filed at 07/21/15 0747  Gross per 24 hour  Intake   1140 ml  Output    750 ml  Net    390 ml   TELEMETRY: Reviewed telemetry pt in NSR:     PHYSICAL EXAM General: Well developed, well nourished, in no acute distress Head:   Normal cephalic and atramatic  Lungs:   Clear bilaterally to auscultation. Heart:   HRRR S1 S2  No JVD.   Abdomen: abdomen soft and non-tender Msk:  Back normal,  Normal strength and tone for age. Extremities:   No edema.   Neuro: Short term memory loss Psych:  Normal affect, responds appropriately Skin: No rash   LABS: Basic Metabolic Panel:  Recent Labs  95/62/13 0757 07/21/15 0546  NA 139 137  K 4.1 4.1  CL 101 105  CO2 26 22  GLUCOSE 109* 128*  BUN 9 12  CREATININE 1.01 1.11  CALCIUM 9.1 8.7*   Liver Function Tests: No results for input(s): AST, ALT, ALKPHOS, BILITOT, PROT, ALBUMIN in the last 72 hours. No results for input(s): LIPASE, AMYLASE in the last 72 hours. CBC:  Recent Labs  07/20/15 0757 07/21/15 0546  WBC 6.1 6.5  HGB 14.0 12.9*  HCT 41.0 39.8  MCV 84.7 85.4  PLT 223 215   Cardiac Enzymes: No results for input(s): CKTOTAL, CKMB, CKMBINDEX, TROPONINI in the last 72 hours. BNP: Invalid input(s): POCBNP D-Dimer: No results for input(s): DDIMER in the last 72 hours. Hemoglobin A1C: No results for input(s): HGBA1C in the last 72 hours. Fasting Lipid Panel: No results for input(s): CHOL, HDL, LDLCALC, TRIG, CHOLHDL, LDLDIRECT in the last 72  hours. Thyroid Function Tests: No results for input(s): TSH, T4TOTAL, T3FREE, THYROIDAB in the last 72 hours.  Invalid input(s): FREET3 Anemia Panel: No results for input(s): VITAMINB12, FOLATE, FERRITIN, TIBC, IRON, RETICCTPCT in the last 72 hours. Coag Panel:   Lab Results  Component Value Date   INR 1.05 07/20/2015   INR 1.0 12/04/2008   INR 1.0 12/01/2008    RADIOLOGY: Nm Myocar Multi W/spect W/wall Motion / Ef  07/17/2015   No diagnostic ST segment changes to indicate ischemia.  Large, moderate intensity, partially reversible anterolateral defect consistent with scar and at least moderate peri-infarct ischemia.  This is a high risk study.  Nuclear stress EF: 43%. Anterolateral hypokinesis noted.       ASSESSMENT:   PLAN:  S/p PCI: stressed importance of DAPT to wife who takes care of medicines. D/C later today.  Persistent memory loss.   Corky Crafts, MD  07/21/2015  8:14 AM

## 2015-07-21 NOTE — Discharge Instructions (Addendum)
**  PLEASE REMEMBER TO BRING ALL OF YOUR MEDICATIONS TO EACH OF YOUR FOLLOW-UP OFFICE VISITS.  NO HEAVY LIFTING OR SEXUAL ACTIVITY X 7 DAYS. NO DRIVING X 3-5 DAYS. NO SOAKING BATHS, HOT TUBS, POOLS, ETC., X 7 DAYS.  Radial Site Care Refer to this sheet in the next few weeks. These instructions provide you with information on caring for yourself after your procedure. Your caregiver may also give you more specific instructions. Your treatment has been planned according to current medical practices, but problems sometimes occur. Call your caregiver if you have any problems or questions after your procedure. HOME CARE INSTRUCTIONS  You may shower the day after the procedure.Remove the bandage (dressing) and gently wash the site with plain soap and water.Gently pat the site dry.   Do not apply powder or lotion to the site.   Do not submerge the affected site in water for 3 to 5 days.   Inspect the site at least twice daily.   Do not flex or bend the affected arm for 24 hours.   No lifting over 5 pounds (2.3 kg) for 5 days after your procedure.   Do not drive home if you are discharged the same day of the procedure. Have someone else drive you.   What to expect:  Any bruising will usually fade within 1 to 2 weeks.   Blood that collects in the tissue (hematoma) may be painful to the touch. It should usually decrease in size and tenderness within 1 to 2 weeks.  SEEK IMMEDIATE MEDICAL CARE IF:  You have unusual pain at the radial site.   You have redness, warmth, swelling, or pain at the radial site.   You have drainage (other than a small amount of blood on the dressing).   You have chills.   You have a fever or persistent symptoms for more than 72 hours.   You have a fever and your symptoms suddenly get worse.   Your arm becomes pale, cool, tingly, or numb.   You have heavy bleeding from the site. Hold pressure on the site.      10 Habits of Highly Healthy People  [facility] wants to help you get well and stay well.  Live a longer, healthier life by practicing healthy habits every day.  1.  Visit your primary care provider regularly. 2.  Make time for family and friends.  Healthy relationships are important. 3.  Take medications as directed by your provider. 4.  Maintain a healthy weight and a trim waistline. 5.  Eat healthy meals and snacks, rich in fruits, vegetables, whole grains, and lean proteins. 6.  Get moving every day - aim for 150 minutes of moderate physical activity each week. 7.  Don't smoke. 8.  Avoid alcohol or drink in moderation. 9.  Manage stress through meditation or mindful relaxation. 10.  Get seven to nine hours of quality sleep each night.  Want more information on healthy habits?  To learn more about these and other healthy habits, visit [email or web address]. _____________     [name] Cummings TAKING OMEPRAZOLE (PRILOSEC).

## 2015-08-03 ENCOUNTER — Encounter: Payer: Self-pay | Admitting: Adult Health

## 2015-08-03 ENCOUNTER — Ambulatory Visit (INDEPENDENT_AMBULATORY_CARE_PROVIDER_SITE_OTHER): Payer: 59 | Admitting: Adult Health

## 2015-08-03 VITALS — BP 108/68 | HR 70 | Ht 75.0 in | Wt 255.0 lb

## 2015-08-03 DIAGNOSIS — I214 Non-ST elevation (NSTEMI) myocardial infarction: Secondary | ICD-10-CM

## 2015-08-03 DIAGNOSIS — I10 Essential (primary) hypertension: Secondary | ICD-10-CM | POA: Diagnosis not present

## 2015-08-03 DIAGNOSIS — I2510 Atherosclerotic heart disease of native coronary artery without angina pectoris: Secondary | ICD-10-CM | POA: Diagnosis not present

## 2015-08-03 MED ORDER — ATORVASTATIN CALCIUM 20 MG PO TABS: 20.0000 mg | ORAL_TABLET | Freq: Every day | ORAL | Status: DC

## 2015-08-03 NOTE — Progress Notes (Deleted)
Name: [name] Cummings    DOB: 10/06/1960  Age: 55 y.o.  MR#: [id number]       PCP:  Stephens Shire, MD      Insurance: Payor: [employer] / Plan: UNITED HEALTHCARE OTHER / Product Type: *No Product type* /   CC:   No chief complaint on file.   VS Filed Vitals:   08/03/15 1428  BP: 108/68  Pulse: 70  Height: _0  (1.905 m)  Weight: 255 lb (115.667 kg)  SpO2: 94%    Weights Current Weight  08/03/15 255 lb (115.667 kg)  07/21/15 261 lb 7.5 oz (118.6 kg)  07/10/15 262 lb (118.842 kg)    Blood Pressure  BP Readings from Last 3 Encounters:  08/03/15 108/68  07/21/15 126/68  07/10/15 112/78     Admit date:  (Not on file) Last encounter with RMR:  Visit date not found   Allergy Atorvastatin; Hydromorphone hcl; Lorazepam; Pseudoephedrine; and Terfenadine  Current Outpatient Prescriptions  Medication Sig Dispense Refill  . aspirin EC 81 MG tablet Take 81 mg by mouth daily.    Marland Kitchen atorvastatin (LIPITOR) 40 MG tablet Take 40 mg by mouth daily at 6 PM.     . carvedilol (COREG) 25 MG tablet Take 25 mg by mouth 2 (two) times daily with a meal.      . clopidogrel (PLAVIX) 75 MG tablet Take 1 tablet (75 mg total) by mouth daily. 30 tablet 6  . FLUoxetine (PROZAC) 20 MG capsule Take 20 mg by mouth at bedtime.     . fluticasone (FLONASE) 50 MCG/ACT nasal spray Place 1 spray into both nostrils daily as needed for allergies.     . furosemide (LASIX) 20 MG tablet Take 20 mg by mouth daily.     Marland Kitchen loratadine (CLARITIN) 10 MG tablet Take 10 mg by mouth daily.    . nitroGLYCERIN (NITROSTAT) 0.4 MG SL tablet Place 1 tablet (0.4 mg total) under the tongue every 5 (five) minutes as needed for chest pain. 25 tablet 3  . pantoprazole (PROTONIX) 40 MG tablet Take 1 tablet (40 mg total) by mouth daily. 30 tablet 6   No current facility-administered medications for this visit.    Discontinued Meds:    Medications Discontinued During This Encounter  Medication Reason  . simvastatin (ZOCOR) 20  MG tablet Error    Patient Active Problem List   Diagnosis Date Noted  . Anxiety 07/21/2015  . Unstable angina ([place]) 07/21/2015  . Hypertensive heart disease   . Hyperlipidemia, mixed   . CAD (coronary artery disease)   . Brain anoxic injury ([place])   . Angina pectoris ([place])   . 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 CAD, NATIVE VESSEL 07/16/2009  . 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 HYPERLIPIDEMIA-MIXED 11/10/2008  . Anxiety state 11/10/2008  . Depression 11/10/2008  . HYPERTENSION, UNSPECIFIED 11/10/2008  . CARDIOMYOPATHY, ISCHEMIC 11/10/2008  . VENTRICULAR FIBRILLATION 11/10/2008  . DYSPHAGIA UNSPECIFIED 11/10/2008  . SEIZURE DISORDER, HX OF 11/10/2008    LABS    Component Value Date/Time   NA 137 07/21/2015 0546   NA 139 07/20/2015 0757   NA 142 07/16/2009 0915   K 4.1 07/21/2015 0546   K 4.1 07/20/2015 0757   K 4.4 07/16/2009 0915   CL 105 07/21/2015 0546   CL 101 07/20/2015 0757   CL 105 07/16/2009 0915   CO2 22 07/21/2015 0546   CO2 26 07/20/2015 0757   CO2 25 07/16/2009 0915   GLUCOSE 128* 07/21/2015  0546   GLUCOSE 109* 07/20/2015 0757   GLUCOSE 91 07/16/2009 0915   BUN 12 07/21/2015 0546   BUN 9 07/20/2015 0757   BUN 10 07/16/2009 0915   CREATININE 1.11 07/21/2015 0546   CREATININE 1.01 07/20/2015 0757   CREATININE 1.2 07/16/2009 0915   CALCIUM 8.7* 07/21/2015 0546   CALCIUM 9.1 07/20/2015 0757   CALCIUM 9.3 07/16/2009 0915   GFRNONAA >60 07/21/2015 0546   GFRNONAA >60 07/20/2015 0757   GFRNONAA 68.55 07/16/2009 0915   GFRAA >60 07/21/2015 0546   GFRAA >60 07/20/2015 0757   GFRAA  12/05/2008 0631    >60        The eGFR has been calculated using the MDRD equation. This calculation has not been validated in all clinical situations. eGFR's persistently <60 mL/min signify possible Chronic Kidney Disease.   CMP     Component Value Date/Time   NA 137 07/21/2015 0546   K 4.1 07/21/2015 0546   CL 105 07/21/2015 0546   CO2 22 07/21/2015  0546   GLUCOSE 128* 07/21/2015 0546   BUN 12 07/21/2015 0546   CREATININE 1.11 07/21/2015 0546   CALCIUM 8.7* 07/21/2015 0546   PROT 7.0 07/16/2009 0915   ALBUMIN 4.3 07/16/2009 0915   AST 24 07/16/2009 0915   ALT 23 07/16/2009 0915   ALKPHOS 109 07/16/2009 0915   BILITOT 0.8 07/16/2009 0915   GFRNONAA >60 07/21/2015 0546   GFRAA >60 07/21/2015 0546       Component Value Date/Time   WBC 6.5 07/21/2015 0546   WBC 6.1 07/20/2015 0757   WBC 5.8 12/05/2008 0631   HGB 12.9* 07/21/2015 0546   HGB 14.0 07/20/2015 0757   HGB 12.8* 12/05/2008 0631   HCT 39.8 07/21/2015 0546   HCT 41.0 07/20/2015 0757   HCT 37.5* 12/05/2008 0631   MCV 85.4 07/21/2015 0546   MCV 84.7 07/20/2015 0757   MCV 86.5 12/05/2008 0631    Lipid Panel     Component Value Date/Time   CHOL 148 07/16/2009 0915   TRIG 80.0 07/16/2009 0915   HDL 40.70 07/16/2009 0915   CHOLHDL 4 07/16/2009 0915   VLDL 16.0 07/16/2009 0915   LDLCALC 91 07/16/2009 0915    ABG    Component Value Date/Time   HCO3 28.8* 06/16/2007 2219   TCO2 25 12/01/2008 1635     Lab Results  Component Value Date   TSH 2.284 ***Test methodology is 3rd generation TSH*** 12/01/2008   BNP (last 3 results) No results for input(s): BNP in the last 8760 hours.  ProBNP (last 3 results) No results for input(s): PROBNP in the last 8760 hours.  Cardiac Panel (last 3 results) No results for input(s): CKTOTAL, CKMB, TROPONINI, RELINDX in the last 72 hours.  Iron/TIBC/Ferritin/ %Sat No results found for: IRON, TIBC, FERRITIN, IRONPCTSAT   EKG Orders placed or performed during the hospital encounter of 07/20/15  . EKG 12-Lead immediately post procedure  . EKG 12-Lead  . EKG 12-Lead immediately post procedure  . EKG 12-Lead     Prior Assessment and Plan Problem List as of 08/03/2015      Cardiovascular and Mediastinum   HYPERTENSION, UNSPECIFIED   CAD, NATIVE VESSEL   Last Assessment & Plan 07/03/2011 Office Visit Written 07/03/2011 10:07  AM by Renella Cunas, MD    Stable. No change in medical therapy.      CARDIOMYOPATHY, ISCHEMIC   VENTRICULAR FIBRILLATION   Old MI (myocardial infarction)   Angina pectoris ([place])   Hypertensive  heart disease   CAD (coronary artery disease)   Unstable angina (HCC)     Digestive   DYSPHAGIA UNSPECIFIED     Nervous and Auditory   ANOXIC BRAIN DAMAGE   Brain anoxic injury [facility])     Other   HYPERLIPIDEMIA-MIXED   Last Assessment & Plan 07/03/2011 Office Visit Written 07/03/2011 10:08 AM by Renella Cunas, MD    Followed by primary care. Crestor renewed however.      Anxiety state   Depression   SEIZURE DISORDER, HX OF   Hyperlipidemia, mixed   Anxiety       Imaging: Nm Myocar Multi W/spect W/wall Motion / Ef  07/17/2015   No diagnostic ST segment changes to indicate ischemia.  Large, moderate intensity, partially reversible anterolateral defect consistent with scar and at least moderate peri-infarct ischemia.  This is a high risk study.  Nuclear stress EF: 43%. Anterolateral hypokinesis noted.

## 2015-08-03 NOTE — Progress Notes (Signed)
Cardiology Office Note   Date:  08/03/2015   ID:  [name] Cummings, DOB [birth year], MRN [id number]  PCP:  [name], MD  Cardiologist: [name], NP   No chief complaint on file.     History of Present Illness: [name] Cummings is a 55 y.o. male who presents for ongoing assessment and management of coronary artery disease, with recent admission for unstable angina, hypertensive heart disease, hyperlipidemia, with history of anxiety, depression, and brain anoxic injury.  He was seen in the emergency room for recurrent chest pain, and sent to [facility] hospital for cardiac catheterization.  Cardiac catheterization on 07/20/2015 revealed a mid LAD 80% stenosis, which was diffuse, the lesion had previously been treated with a bare metal stent greater than 2 years prior.  He underwent a PCI of the mid LAD using a drug-eluting stent.  The left circumflex had minimal luminal irregularities, right coronary artery lesion was 50% stenosed.  The patient was continued on aspirin, carvedilol,Plavix, Lasix, and simvastatin.  He is here for post hospitalization followup.  The patient has very poor memory, due to anoxic brain injury, and his wife was with him provides most of the information.  He denies any chest pain, but also does not remember being in the hospital.  His wife is concerned that they placed him back on a statin when he had significant myalgias.  There is no bleeding issues or significant bruising.  His wife take him for a walk every day, but he is easily fatigued after about 15 minutes.  She make sure he takes his medicines every day.  They have not heard from cardiac rehabilitation yet.  Past Medical History  Diagnosis Date  . Ischemic cardiomyopathy     a. 06/2014 Echo: EF 60-65%.  Marland Kitchen CAD (coronary artery disease)     a. 2007 Ant MI/VF Arrest/PCI LAD w/ BMS;  b. 07/2015 Cath/PCI: LM nl, LAD 80 ISR (2.5x28 Synergy DES), LCX min irregs, OM1 99, RCA 50p.  . Ventricular fibrillation  (HCC)     a. 2007->VF Arrest.  . Hypertensive heart disease     unspecified  . Hyperlipidemia, mixed   . Anxiety   . Depression   . Dysphagia, unspecified(787.20)   . History of seizure disorder   . Brain anoxic injury (HCC)     a. 2007 in setting of VF arrest.  . GERD (gastroesophageal reflux disease)   . Memory loss     a. since VF arrest and anoxic brain injury in 2007.    Past Surgical History  Procedure Laterality Date  . Stent thrombosis  03/2006    Complicated my sudden cardiac death  . Kidney stone surgery    . Cardiac catheterization  09/2005    with bare metal stent to the mid LAD (Minivision 2.5 x 18 mm) with Provisional balloon angioplasty to the second diagonal.  . Cardiac catheterization  03/2006    with stenting of the mid LAD with Taxus 2.5 x 20 mm and 2.5 x 8 mm stents.      . Bedside tracheostomy      with #6 [place]. 04/14/2006  . Esophagogastroduodenoscopy    . Peg tube placement    . Cardiac catheterization N/A 07/20/2015    Procedure: Left Heart Cath and Coronary Angiography;  Surgeon: Corky Crafts, MD;  Location: [facility] INVASIVE CV LAB;  Service: Cardiovascular;  Laterality: N/A;  . Cardiac catheterization N/A 07/20/2015    Procedure: Coronary Stent Intervention;  Surgeon: Donnie Coffin  Eldridge Dace, MD;  Location: [facility] INVASIVE CV LAB;  Service: Cardiovascular;  Laterality: N/A;  . Tracheostomy closure    . Peg tube removal    . Coronary stent placement  07/20/2015    LAD with DES     Current Outpatient Prescriptions  Medication Sig Dispense Refill  . aspirin EC 81 MG tablet Take 81 mg by mouth daily.    Marland Kitchen atorvastatin (LIPITOR) 40 MG tablet Take 40 mg by mouth daily at 6 PM.     . carvedilol (COREG) 25 MG tablet Take 25 mg by mouth 2 (two) times daily with a meal.      . clopidogrel (PLAVIX) 75 MG tablet Take 1 tablet (75 mg total) by mouth daily. 30 tablet 6  . FLUoxetine (PROZAC) 20 MG capsule Take 20 mg by mouth at bedtime.     . fluticasone (FLONASE)  50 MCG/ACT nasal spray Place 1 spray into both nostrils daily as needed for allergies.     . furosemide (LASIX) 20 MG tablet Take 20 mg by mouth daily.     Marland Kitchen loratadine (CLARITIN) 10 MG tablet Take 10 mg by mouth daily.    . nitroGLYCERIN (NITROSTAT) 0.4 MG SL tablet Place 1 tablet (0.4 mg total) under the tongue every 5 (five) minutes as needed for chest pain. 25 tablet 3  . pantoprazole (PROTONIX) 40 MG tablet Take 1 tablet (40 mg total) by mouth daily. 30 tablet 6   No current facility-administered medications for this visit.    Allergies:   Atorvastatin; Hydromorphone hcl; Lorazepam; Pseudoephedrine; and Terfenadine    Social History:  The patient  reports that he has never smoked. He has never used smokeless tobacco. He reports that he does not drink alcohol or use illicit drugs.   Family History:  The patient's family history includes Heart attack in his father and mother. There is no history of Coronary artery disease.    ROS: All other systems are reviewed and negative. Unless otherwise mentioned in H&P    PHYSICAL EXAM: VS:  BP 108/68 mmHg  Pulse 70  Ht  (1.905 m)  Wt 255 lb (115.667 kg)  BMI 31.87 kg/m2  SpO2 94% , BMI Body mass index is 31.87 kg/(m^2). GEN: Well nourished, well developed, in no acute distress HEENT: normal Neck: no JVD, carotid bruits, or masses Cardiac: RRR; no murmurs, rubs, or gallops,no edema  Respiratory:  clear to auscultation bilaterally, normal work of breathing GI: soft, nontender, nondistended, + BS MS: no deformity or atrophyright wrist insertion site from catheterization is well-healed. Skin: warm and dry, no rash Neuro:  Strength and sensation are intact Psych: euthymic mood, full affect  Recent Labs: 07/21/2015: BUN 12; Creatinine, Ser 1.11; Hemoglobin 12.9*; Platelets 215; Potassium 4.1; Sodium 137    Lipid Panel    Component Value Date/Time   CHOL 148 07/16/2009 0915   TRIG 80.0 07/16/2009 0915   HDL 40.70 07/16/2009 0915    CHOLHDL 4 07/16/2009 0915   VLDL 16.0 07/16/2009 0915   LDLCALC 91 07/16/2009 0915      Wt Readings from Last 3 Encounters:  08/03/15 255 lb (115.667 kg)  07/21/15 261 lb 7.5 oz (118.6 kg)  07/10/15 262 lb (118.842 kg)    ASSESSMENT AND PLAN:  1. Coronary artery disease: Status post myocardial infarction with restenosis of the stent to the mid LAD with new stent placed within the same site.  He continues on aspirin, Plavix, carvedilol.  He does not wish to take statins because  his wife states that he has hip and leg pain associated with it.  I explained that it would be important to keep his cholesterol under control.  I am going to reduce his atorvastatin from 40 mg daily to 20 mg every other day to have some protection.  We will see him again in 3 months after her.  He has been to cardiac rehabilitation.  I have made sure that a referral is sent.  2. Hypertension:blood pressure is well controlled currently.  He will continue carvedilol 25 mg twice a day as directed.   Current medicines are reviewed at length with the patient today.    Labs/ tests ordered today include:  No orders of the defined types were placed in this encounter.     Disposition:   FU with 3 months  Signed, Joni Reining, NP  08/03/2015 2:33 PM    [facility] [address] [place], [place] [address] Phone: [phone]; Fax: [phone]

## 2015-08-03 NOTE — Patient Instructions (Addendum)
Medication Instructions:  DECREASE ATORVASTATIN TO 20 MG EVERY OTHER DAY   Labwork: NONE  Testing/Procedures: NONE  Follow-Up: Your physician recommends that you schedule a follow-up appointment in: 3  MONTHS    Any Other Special Instructions Will Be Listed Below (If Applicable). I PUT IN A REFERRAL FOR CARDIAC REHAB. THEY SHOULD CONTACT YOU SOON.     If you need a refill on your cardiac medications before your next appointment, please call your pharmacy.

## 2015-08-06 ENCOUNTER — Encounter: Payer: Self-pay | Admitting: Adult Health

## 2015-08-06 ENCOUNTER — Ambulatory Visit (INDEPENDENT_AMBULATORY_CARE_PROVIDER_SITE_OTHER): Payer: 59 | Admitting: Adult Health

## 2015-08-06 VITALS — BP 126/78 | HR 81 | Ht 75.0 in | Wt 256.0 lb

## 2015-08-06 DIAGNOSIS — I2510 Atherosclerotic heart disease of native coronary artery without angina pectoris: Secondary | ICD-10-CM | POA: Diagnosis not present

## 2015-08-06 DIAGNOSIS — Z01818 Encounter for other preprocedural examination: Secondary | ICD-10-CM

## 2015-08-06 DIAGNOSIS — R072 Precordial pain: Secondary | ICD-10-CM | POA: Diagnosis not present

## 2015-08-06 MED ORDER — ISOSORBIDE MONONITRATE ER 30 MG PO TB24: 15.0000 mg | ORAL_TABLET | Freq: Every day | ORAL | Status: DC

## 2015-08-06 NOTE — Progress Notes (Deleted)
Name: KHALIF STENDER    DOB: 1961/01/27  Age: 55 y.o.  MR#: [id number]       PCP:  Stephens Shire, MD      Insurance: Payor: [employer] / Plan: UNITED HEALTHCARE OTHER / Product Type: *No Product type* /   CC:   No chief complaint on file.   VS Filed Vitals:   08/06/15 1445  BP: 126/78  Pulse: 81  Height: 6' 3"  (1.905 m)  Weight: 256 lb (116.121 kg)  SpO2: 97%    Weights Current Weight  08/06/15 256 lb (116.121 kg)  08/03/15 255 lb (115.667 kg)  07/21/15 261 lb 7.5 oz (118.6 kg)    Blood Pressure  BP Readings from Last 3 Encounters:  08/06/15 126/78  08/03/15 108/68  07/21/15 126/68     Admit date:  (Not on file) Last encounter with RMR:  08/03/2015   Allergy Atorvastatin; Hydromorphone hcl; Lorazepam; Pseudoephedrine; and Terfenadine  Current Outpatient Prescriptions  Medication Sig Dispense Refill  . aspirin EC 81 MG tablet Take 81 mg by mouth daily.    Marland Kitchen atorvastatin (LIPITOR) 20 MG tablet Take 1 tablet (20 mg total) by mouth daily. (Patient taking differently: Take 20 mg by mouth daily. TAKE 20 MG EVERY OTHER DAY) 90 tablet 3  . carvedilol (COREG) 25 MG tablet Take 25 mg by mouth 2 (two) times daily with a meal.      . clopidogrel (PLAVIX) 75 MG tablet Take 1 tablet (75 mg total) by mouth daily. 30 tablet 6  . FLUoxetine (PROZAC) 20 MG capsule Take 20 mg by mouth at bedtime.     . fluticasone (FLONASE) 50 MCG/ACT nasal spray Place 1 spray into both nostrils daily as needed for allergies.     . furosemide (LASIX) 20 MG tablet Take 20 mg by mouth daily.     Marland Kitchen loratadine (CLARITIN) 10 MG tablet Take 10 mg by mouth daily.    . nitroGLYCERIN (NITROSTAT) 0.4 MG SL tablet Place 1 tablet (0.4 mg total) under the tongue every 5 (five) minutes as needed for chest pain. 25 tablet 3  . pantoprazole (PROTONIX) 40 MG tablet Take 1 tablet (40 mg total) by mouth daily. 30 tablet 6   No current facility-administered medications for this visit.    Discontinued Meds:   There  are no discontinued medications.  Patient Active Problem List   Diagnosis Date Noted  . Anxiety 07/21/2015  . Unstable angina ([place]) 07/21/2015  . Hypertensive heart disease   . Hyperlipidemia, mixed   . CAD (coronary artery disease)   . Brain anoxic injury ([place])   . Angina pectoris ([place])   . 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 CAD, NATIVE VESSEL 07/16/2009  . 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 HYPERLIPIDEMIA-MIXED 11/10/2008  . Anxiety state 11/10/2008  . Depression 11/10/2008  . HYPERTENSION, UNSPECIFIED 11/10/2008  . CARDIOMYOPATHY, ISCHEMIC 11/10/2008  . VENTRICULAR FIBRILLATION 11/10/2008  . DYSPHAGIA UNSPECIFIED 11/10/2008  . SEIZURE DISORDER, HX OF 11/10/2008    LABS    Component Value Date/Time   NA 137 07/21/2015 0546   NA 139 07/20/2015 0757   NA 142 07/16/2009 0915   K 4.1 07/21/2015 0546   K 4.1 07/20/2015 0757   K 4.4 07/16/2009 0915   CL 105 07/21/2015 0546   CL 101 07/20/2015 0757   CL 105 07/16/2009 0915   CO2 22 07/21/2015 0546   CO2 26 07/20/2015 0757   CO2 25 07/16/2009 0915   GLUCOSE 128* 07/21/2015 0546  GLUCOSE 109* 07/20/2015 0757   GLUCOSE 91 07/16/2009 0915   BUN 12 07/21/2015 0546   BUN 9 07/20/2015 0757   BUN 10 07/16/2009 0915   CREATININE 1.11 07/21/2015 0546   CREATININE 1.01 07/20/2015 0757   CREATININE 1.2 07/16/2009 0915   CALCIUM 8.7* 07/21/2015 0546   CALCIUM 9.1 07/20/2015 0757   CALCIUM 9.3 07/16/2009 0915   GFRNONAA >60 07/21/2015 0546   GFRNONAA >60 07/20/2015 0757   GFRNONAA 68.55 07/16/2009 0915   GFRAA >60 07/21/2015 0546   GFRAA >60 07/20/2015 0757   GFRAA  12/05/2008 0631    >60        The eGFR has been calculated using the MDRD equation. This calculation has not been validated in all clinical situations. eGFR's persistently <60 mL/min signify possible Chronic Kidney Disease.   CMP     Component Value Date/Time   NA 137 07/21/2015 0546   K 4.1 07/21/2015 0546   CL 105 07/21/2015 0546   CO2 22  07/21/2015 0546   GLUCOSE 128* 07/21/2015 0546   BUN 12 07/21/2015 0546   CREATININE 1.11 07/21/2015 0546   CALCIUM 8.7* 07/21/2015 0546   PROT 7.0 07/16/2009 0915   ALBUMIN 4.3 07/16/2009 0915   AST 24 07/16/2009 0915   ALT 23 07/16/2009 0915   ALKPHOS 109 07/16/2009 0915   BILITOT 0.8 07/16/2009 0915   GFRNONAA >60 07/21/2015 0546   GFRAA >60 07/21/2015 0546       Component Value Date/Time   WBC 6.5 07/21/2015 0546   WBC 6.1 07/20/2015 0757   WBC 5.8 12/05/2008 0631   HGB 12.9* 07/21/2015 0546   HGB 14.0 07/20/2015 0757   HGB 12.8* 12/05/2008 0631   HCT 39.8 07/21/2015 0546   HCT 41.0 07/20/2015 0757   HCT 37.5* 12/05/2008 0631   MCV 85.4 07/21/2015 0546   MCV 84.7 07/20/2015 0757   MCV 86.5 12/05/2008 0631    Lipid Panel     Component Value Date/Time   CHOL 148 07/16/2009 0915   TRIG 80.0 07/16/2009 0915   HDL 40.70 07/16/2009 0915   CHOLHDL 4 07/16/2009 0915   VLDL 16.0 07/16/2009 0915   LDLCALC 91 07/16/2009 0915    ABG    Component Value Date/Time   HCO3 28.8* 06/16/2007 2219   TCO2 25 12/01/2008 1635     Lab Results  Component Value Date   TSH 2.284 ***Test methodology is 3rd generation TSH*** 12/01/2008   BNP (last 3 results) No results for input(s): BNP in the last 8760 hours.  ProBNP (last 3 results) No results for input(s): PROBNP in the last 8760 hours.  Cardiac Panel (last 3 results) No results for input(s): CKTOTAL, CKMB, TROPONINI, RELINDX in the last 72 hours.  Iron/TIBC/Ferritin/ %Sat No results found for: IRON, TIBC, FERRITIN, IRONPCTSAT   EKG Orders placed or performed during the hospital encounter of 07/20/15  . EKG 12-Lead immediately post procedure  . EKG 12-Lead  . EKG 12-Lead immediately post procedure  . EKG 12-Lead     Prior Assessment and Plan Problem List as of 08/06/2015      Cardiovascular and Mediastinum   HYPERTENSION, UNSPECIFIED   CAD, NATIVE VESSEL   Last Assessment & Plan 07/03/2011 Office Visit Written  07/03/2011 10:07 AM by Renella Cunas, MD    Stable. No change in medical therapy.      CARDIOMYOPATHY, ISCHEMIC   VENTRICULAR FIBRILLATION   Old MI (myocardial infarction)   Angina pectoris (HCC)   Hypertensive heart disease  CAD (coronary artery disease)   Unstable angina (HCC)     Digestive   DYSPHAGIA UNSPECIFIED     Nervous and Auditory   ANOXIC BRAIN DAMAGE   Brain anoxic injury [facility])     Other   HYPERLIPIDEMIA-MIXED   Last Assessment & Plan 07/03/2011 Office Visit Written 07/03/2011 10:08 AM by Renella Cunas, MD    Followed by primary care. Crestor renewed however.      Anxiety state   Depression   SEIZURE DISORDER, HX OF   Hyperlipidemia, mixed   Anxiety       Imaging: Nm Myocar Multi W/spect W/wall Motion / Ef  07/17/2015   No diagnostic ST segment changes to indicate ischemia.  Large, moderate intensity, partially reversible anterolateral defect consistent with scar and at least moderate peri-infarct ischemia.  This is a high risk study.  Nuclear stress EF: 43%. Anterolateral hypokinesis noted.

## 2015-08-06 NOTE — Patient Instructions (Signed)
Medication Instructions:  START IMDUR 15 MG DAILY   Labwork: NONE  Testing/Procedures: NONE  Follow-Up: Your physician recommends that you schedule a follow-up appointment in: 3 MONTHS    Any Other Special Instructions Will Be Listed Below (If Applicable).     If you need a refill on your cardiac medications before your next appointment, please call your pharmacy.

## 2015-08-06 NOTE — Progress Notes (Signed)
Cardiology Office Note   Date:  08/06/2015   ID:  [name] Cummings, DOB 08-26-1960, MRN [id number]  PCP:  [name], MD  Cardiologist: [name], NP   Chief Complaint  Patient presents with  . Chest Pain      History of Present Illness: [name] Cummings is a 55 y.o. male who presents for ongoing assessment and management of CAD, with recent admission for unstable angina, hypertensive heart disease, hyperlipidemia, with history of anxiety, depression, and brain anoxic injury.  The patient was admitted in January of 2015 for recurrent chest discomfort, had a catheterization on 07/20/2015, which revealed a mid LAD 80% stenosis, which was diffuse, the lesion had previously been treated with a bare metal stent, and underwent PCI of the mid LAD using a drug-eluting stent.  The left circumflex had minimal luminal irregularities.  Right coronary artery lesion was 50% stenosed.  He was continued on dual antiplatelet therapy with aspirin and Plavix, along with carvedilol, Lasix, and simvastatin.  He was last seen in the office 3 days ago, at which time he was without discomfort.  The weekend, the patient began to have some chest discomfort on the left sternum.  What she described as someone punching him in the chest.  This occurred while he was reclining.  This also occurred when he was walking.  He has no complaints of shortness of breath or diaphoresis associated with this.  Past Medical History  Diagnosis Date  . Ischemic cardiomyopathy     a. 06/2014 Echo: EF 60-65%.  Marland Kitchen CAD (coronary artery disease)     a. 2007 Ant MI/VF Arrest/PCI LAD w/ BMS;  b. 07/2015 Cath/PCI: LM nl, LAD 80 ISR (2.5x28 Synergy DES), LCX min irregs, OM1 99, RCA 50p.  . Ventricular fibrillation (HCC)     a. 2007->VF Arrest.  . Hypertensive heart disease     unspecified  . Hyperlipidemia, mixed   . Anxiety   . Depression   . Dysphagia, unspecified(787.20)   . History of seizure disorder   . Brain anoxic  injury (HCC)     a. 2007 in setting of VF arrest.  . GERD (gastroesophageal reflux disease)   . Memory loss     a. since VF arrest and anoxic brain injury in 2007.    Past Surgical History  Procedure Laterality Date  . Stent thrombosis  03/2006    Complicated my sudden cardiac death  . Kidney stone surgery    . Cardiac catheterization  09/2005    with bare metal stent to the mid LAD (Minivision 2.5 x 18 mm) with Provisional balloon angioplasty to the second diagonal.  . Cardiac catheterization  03/2006    with stenting of the mid LAD with Taxus 2.5 x 20 mm and 2.5 x 8 mm stents.      . Bedside tracheostomy      with #6 Shiley. 04/14/2006  . Esophagogastroduodenoscopy    . Peg tube placement    . Cardiac catheterization N/A 07/20/2015    Procedure: Left Heart Cath and Coronary Angiography;  Surgeon: Corky Crafts, MD;  Location: [facility] INVASIVE CV LAB;  Service: Cardiovascular;  Laterality: N/A;  . Cardiac catheterization N/A 07/20/2015    Procedure: Coronary Stent Intervention;  Surgeon: Corky Crafts, MD;  Location: [facility] INVASIVE CV LAB;  Service: Cardiovascular;  Laterality: N/A;  . Tracheostomy closure    . Peg tube removal    . Coronary stent placement  07/20/2015    LAD with  DES     Current Outpatient Prescriptions  Medication Sig Dispense Refill  . aspirin EC 81 MG tablet Take 81 mg by mouth daily.    Marland Kitchen atorvastatin (LIPITOR) 20 MG tablet Take 1 tablet (20 mg total) by mouth daily. (Patient taking differently: Take 20 mg by mouth daily. TAKE 20 MG EVERY OTHER DAY) 90 tablet 3  . carvedilol (COREG) 25 MG tablet Take 25 mg by mouth 2 (two) times daily with a meal.      . clopidogrel (PLAVIX) 75 MG tablet Take 1 tablet (75 mg total) by mouth daily. 30 tablet 6  . FLUoxetine (PROZAC) 20 MG capsule Take 20 mg by mouth at bedtime.     . fluticasone (FLONASE) 50 MCG/ACT nasal spray Place 1 spray into both nostrils daily as needed for allergies.     . furosemide (LASIX) 20 MG  tablet Take 20 mg by mouth daily.     Marland Kitchen loratadine (CLARITIN) 10 MG tablet Take 10 mg by mouth daily.    . nitroGLYCERIN (NITROSTAT) 0.4 MG SL tablet Place 1 tablet (0.4 mg total) under the tongue every 5 (five) minutes as needed for chest pain. 25 tablet 3  . pantoprazole (PROTONIX) 40 MG tablet Take 1 tablet (40 mg total) by mouth daily. 30 tablet 6  . isosorbide mononitrate (IMDUR) 30 MG 24 hr tablet Take 0.5 tablets (15 mg total) by mouth daily. 90 tablet 3  . omeprazole (PRILOSEC) 40 MG capsule Take 1 capsule by mouth daily.     No current facility-administered medications for this visit.    Allergies:   Atorvastatin; Hydromorphone hcl; Lorazepam; Pseudoephedrine; and Terfenadine    Social History:  The patient  reports that he has never smoked. He has never used smokeless tobacco. He reports that he does not drink alcohol or use illicit drugs.   Family History:  The patient's family history includes Heart attack in his father and mother. There is no history of Coronary artery disease.    ROS: All other systems are reviewed and negative. Unless otherwise mentioned in H&P    PHYSICAL EXAM: VS:  BP 126/78 mmHg  Pulse 81  Ht  (1.905 m)  Wt 256 lb (116.121 kg)  BMI 32.00 kg/m2  SpO2 97% , BMI Body mass index is 32 kg/(m^2). GEN: Well nourished, well developed, in no acute distress HEENT: normal Neck: no JVD, carotid bruits, or masses Cardiac: RRR; no murmurs, rubs, or gallops,no edema  Respiratory:  clear to auscultation bilaterally, normal work of breathing GI: soft, nontender, nondistended, + BS MS: no deformity or atrophy Skin: warm and dry, no rash Neuro:  Strength and sensation are intact Psych: euthymic mood, full affect   EKG:  The ekg ordered today demonstrates normal sinus rhythm with old lateral infarct, rate of 70 beats per minute, no change since prior EKG.   Recent Labs: 07/21/2015: BUN 12; Creatinine, Ser 1.11; Hemoglobin 12.9*; Platelets 215; Potassium  4.1; Sodium 137    Lipid Panel    Component Value Date/Time   CHOL 148 07/16/2009 0915   TRIG 80.0 07/16/2009 0915   HDL 40.70 07/16/2009 0915   CHOLHDL 4 07/16/2009 0915   VLDL 16.0 07/16/2009 0915   LDLCALC 91 07/16/2009 0915      Wt Readings from Last 3 Encounters:  08/06/15 256 lb (116.121 kg)  08/03/15 255 lb (115.667 kg)  07/21/15 261 lb 7.5 oz (118.6 kg)      ASSESSMENT AND PLAN:  1. Recurrent chest pain: Will  begin him on isosorbide 15 mg by mouth daily.  I explained that the long-acting nitrate should help him with recurrent discomfort in his chest and can be titrated up.  Should this be necessary.  He will keep his followup appointment previously scheduled.  2. Coronary artery disease: status post cardiac catheterization, which revealed in-stent restenosis of the LAD, with new drug-eluting stent.  He is to continue on dual antiplatelet therapy, carvedilol, and statin.  3. Hypercholesterolemia:his wife insists that he continues to have myalgias despite decreasing the dose of Lipitor to 20 mg daily.  I have offered to add fish oil to his current medication regimen, but he states that it causes him too much heartburn.  If this continues to be an issue for him.  We will need to refer him to the lipid clinic to discuss other options.   Current medicines are reviewed at length with the patient today.    Labs/ tests ordered today include:  No orders of the defined types were placed in this encounter.    Follow up on previously scheduled appointment   Signed, Joni Reining, NP  08/06/2015 3:11 PM    [facility] [address] [place], [place] [address] Phone: [phone]; Fax: [phone] I will in a

## 2015-08-07 ENCOUNTER — Encounter (HOSPITAL_COMMUNITY): Payer: Self-pay

## 2015-08-07 ENCOUNTER — Ambulatory Visit (HOSPITAL_COMMUNITY): Admit: 2015-08-07 | Payer: Self-pay | Admitting: Interventional Cardiology

## 2015-08-07 SURGERY — LEFT HEART CATH AND CORONARY ANGIOGRAPHY

## 2015-08-08 ENCOUNTER — Other Ambulatory Visit: Payer: Self-pay

## 2015-08-08 MED ORDER — ATORVASTATIN CALCIUM 20 MG PO TABS: ORAL_TABLET | ORAL | Status: DC

## 2015-08-10 ENCOUNTER — Emergency Department (HOSPITAL_COMMUNITY)
Admission: EM | Admit: 2015-08-10 | Discharge: 2015-08-10 | Disposition: A | Discharge status: Home / Self Care | Payer: 59 | Attending: Emergency Medicine | Admitting: Emergency Medicine

## 2015-08-10 ENCOUNTER — Emergency Department (HOSPITAL_COMMUNITY): Payer: 59

## 2015-08-10 ENCOUNTER — Encounter (HOSPITAL_COMMUNITY): Payer: Self-pay | Admitting: Nurse Practitioner

## 2015-08-10 DIAGNOSIS — R0600 Dyspnea, unspecified: Secondary | ICD-10-CM

## 2015-08-10 DIAGNOSIS — R112 Nausea with vomiting, unspecified: Secondary | ICD-10-CM

## 2015-08-10 DIAGNOSIS — Z79899 Other long term (current) drug therapy: Secondary | ICD-10-CM | POA: Diagnosis not present

## 2015-08-10 DIAGNOSIS — I2510 Atherosclerotic heart disease of native coronary artery without angina pectoris: Secondary | ICD-10-CM | POA: Insufficient documentation

## 2015-08-10 DIAGNOSIS — Z7902 Long term (current) use of antithrombotics/antiplatelets: Secondary | ICD-10-CM | POA: Diagnosis not present

## 2015-08-10 DIAGNOSIS — K219 Gastro-esophageal reflux disease without esophagitis: Secondary | ICD-10-CM | POA: Insufficient documentation

## 2015-08-10 DIAGNOSIS — R0602 Shortness of breath: Secondary | ICD-10-CM | POA: Diagnosis not present

## 2015-08-10 DIAGNOSIS — F419 Anxiety disorder, unspecified: Secondary | ICD-10-CM | POA: Insufficient documentation

## 2015-08-10 DIAGNOSIS — R0789 Other chest pain: Secondary | ICD-10-CM

## 2015-08-10 DIAGNOSIS — Z7982 Long term (current) use of aspirin: Secondary | ICD-10-CM | POA: Diagnosis not present

## 2015-08-10 DIAGNOSIS — F329 Major depressive disorder, single episode, unspecified: Secondary | ICD-10-CM | POA: Insufficient documentation

## 2015-08-10 DIAGNOSIS — I119 Hypertensive heart disease without heart failure: Secondary | ICD-10-CM | POA: Diagnosis not present

## 2015-08-10 LAB — COMPREHENSIVE METABOLIC PANEL
ALBUMIN: 3.8 g/dL (ref 3.5–5.0)
ALT: 19 U/L (ref 17–63)
AST: 17 U/L (ref 15–41)
Alkaline Phosphatase: 106 U/L (ref 38–126)
Anion gap: 10 (ref 5–15)
BUN: 41 mg/dL — AB (ref 6–20)
CHLORIDE: 109 mmol/L (ref 101–111)
CO2: 24 mmol/L (ref 22–32)
CREATININE: 1.08 mg/dL (ref 0.61–1.24)
Calcium: 9.2 mg/dL (ref 8.9–10.3)
GFR calc Af Amer: >60 mL/min (ref >60)
GFR calc non Af Amer: >60 mL/min (ref >60)
Glucose, Bld: 157 mg/dL — ABNORMAL HIGH (ref 65–99)
POTASSIUM: 4.4 mmol/L (ref 3.5–5.1)
SODIUM: 143 mmol/L (ref 135–145)
Total Bilirubin: 0.7 mg/dL (ref 0.3–1.2)
Total Protein: 6.4 g/dL — ABNORMAL LOW (ref 6.5–8.1)

## 2015-08-10 LAB — CBC WITH DIFFERENTIAL/PLATELET
BASOS ABS: 0 10*3/uL (ref 0.0–0.1)
BASOS PCT: 0 %
EOS ABS: 0.1 10*3/uL (ref 0.0–0.7)
EOS PCT: 1 %
HCT: 35.4 % — ABNORMAL LOW (ref 39.0–52.0)
Hemoglobin: 12.2 g/dL — ABNORMAL LOW (ref 13.0–17.0)
LYMPHS PCT: 13 %
Lymphs Abs: 1.3 10*3/uL (ref 0.7–4.0)
MCH: 29.3 pg (ref 26.0–34.0)
MCHC: 34.5 g/dL (ref 30.0–36.0)
MCV: 84.9 fL (ref 78.0–100.0)
Monocytes Absolute: 0.7 10*3/uL (ref 0.1–1.0)
Monocytes Relative: 7 %
Neutro Abs: 8.6 10*3/uL — ABNORMAL HIGH (ref 1.7–7.7)
Neutrophils Relative %: 79 %
PLATELETS: 263 10*3/uL (ref 150–400)
RBC: 4.17 MIL/uL — AB (ref 4.22–5.81)
RDW: 12.6 % (ref 11.5–15.5)
WBC: 10.7 10*3/uL — AB (ref 4.0–10.5)

## 2015-08-10 LAB — I-STAT TROPONIN, ED
TROPONIN I, POC: 0 ng/mL (ref 0.00–0.08)
Troponin i, poc: 0.01 ng/mL (ref 0.00–0.08)

## 2015-08-10 LAB — TROPONIN I: Troponin I: <0.03 ng/mL (ref <0.031)

## 2015-08-10 LAB — D-DIMER, QUANTITATIVE: D-Dimer, Quant: 0.31 ug/mL-FEU (ref 0.00–0.50)

## 2015-08-10 LAB — BRAIN NATRIURETIC PEPTIDE: B NATRIURETIC PEPTIDE 5: 22 pg/mL (ref 0.0–100.0)

## 2015-08-10 MED ORDER — ASPIRIN 325 MG PO TABS
325.0000 mg | ORAL_TABLET | Freq: Once | ORAL | Status: AC
  Administered 2015-08-10: 325 mg via ORAL
  Filled 2015-08-10: qty 1

## 2015-08-10 NOTE — ED Notes (Signed)
EDP at at bedside.

## 2015-08-10 NOTE — ED Notes (Signed)
Pt returned to room from xray.

## 2015-08-10 NOTE — Consult Note (Signed)
Patient ID: DEBRA CALABRETTA MRN: [id number], DOB/AGE: [birth year]   Admit date: 08/10/2015   Primary Physician: [name], MD Primary Cardiologist: Dr. Wyline Mood  Pt. Profile:  55 year old male, followed by Dr. Wyline Mood,  with a prior history of coronary artery disease status post anterior MI and ventricular fibrillation arrest in 2007 requiring stenting of the LAD, recently found to have severe ISR of LAD stent on cath 07/20/15, s/p repeat PCI + stenting of the LAD, normal LVF, presenting back to the ED with recurrent CP and dyspnea. Note: due to anoxic brain injury from his Vfib arrest in 2007, he has very poor memory, thus his wife provides the majority of his history.   Problem List  Past Medical History  Diagnosis Date  . Ischemic cardiomyopathy     a. 06/2014 Echo: EF 60-65%.  Marland Kitchen CAD (coronary artery disease)     a. 2007 Ant MI/VF Arrest/PCI LAD w/ BMS;  b. 07/2015 Cath/PCI: LM nl, LAD 80 ISR (2.5x28 Synergy DES), LCX min irregs, OM1 99, RCA 50p.  . Ventricular fibrillation (HCC)     a. 2007->VF Arrest.  . Hypertensive heart disease     unspecified  . Hyperlipidemia, mixed   . Anxiety   . Depression   . Dysphagia, unspecified(787.20)   . History of seizure disorder   . Brain anoxic injury (HCC)     a. 2007 in setting of VF arrest.  . GERD (gastroesophageal reflux disease)   . Memory loss     a. since VF arrest and anoxic brain injury in 2007.    Past Surgical History  Procedure Laterality Date  . Stent thrombosis  03/2006    Complicated my sudden cardiac death  . Kidney stone surgery    . Cardiac catheterization  09/2005    with bare metal stent to the mid LAD (Minivision 2.5 x 18 mm) with Provisional balloon angioplasty to the second diagonal.  . Cardiac catheterization  03/2006    with stenting of the mid LAD with Taxus 2.5 x 20 mm and 2.5 x 8 mm stents.      . Bedside tracheostomy      with #6 Shiley. 04/14/2006  . Esophagogastroduodenoscopy    . Peg tube  placement    . Cardiac catheterization N/A 07/20/2015    Procedure: Left Heart Cath and Coronary Angiography;  Surgeon: Corky Crafts, MD;  Location: [facility] INVASIVE CV LAB;  Service: Cardiovascular;  Laterality: N/A;  . Cardiac catheterization N/A 07/20/2015    Procedure: Coronary Stent Intervention;  Surgeon: Corky Crafts, MD;  Location: [facility] INVASIVE CV LAB;  Service: Cardiovascular;  Laterality: N/A;  . Tracheostomy closure    . Peg tube removal    . Coronary stent placement  07/20/2015    LAD with DES     Allergies  Allergies  Allergen Reactions  . Atorvastatin     REACTION: Reaction not known  . Hydromorphone Hcl     REACTION: Reaction not known  . Lorazepam     REACTION: Reaction not known  . Pseudoephedrine     REACTION: Reaction not known  . Terfenadine     REACTION: Reaction not known    HPI  55 year old male, followed by Dr. Wyline Mood,  with a prior history of coronary artery disease status post anterior MI and ventricular fibrillation arrest in 2007 requiring stenting of the LAD. As a result of this event, he has suffered an anoxic brain injury and has significant memory loss. He recently was  seen in clinic by Dr. Wyline Mood and reported episodic chest pain. He underwent stress testing which revealed a large partially reversible anterolateral defect. Decision was made to pursue diagnostic catheterization. This was performed on 07/20/15. Cath revealed severe in-stent restenosis within the previously placed bare metal stent in the LAD. He also had significant OM1 disease as well as moderate right coronary artery disease.  The OM1 is subtotally occluded with left to left collaterals coming from the apical LAD. The LAD stenosis was felt to be the culprit for his symptoms and was successfully treated with drug-eluting stent. He was discharged home, in stable condition, on 07/21/15. He was discharged home on ASA, Plavix, Coreg, Lipitor, Lasix, Protonix and PRN SL NTG. His EF was last  assessed 07/08/15 by echo and was normal at 60-65%.  He was seen in clinic for post hospital f/u on 08/03/15 and was doing well w/o any recurrent symptoms. His only concern with being restarted on a statin, as he had a previous history of statin induced myalgias. His Lipitor was decreased from 40 mg to 20 mg. He presented back to the office 08/06/15 with a complaint of recurrent CP. Decision was made to start him on 15 mg of Imdur daily. However his wife reports that he has not yet started this as they are waiting for mail order pharmacy delivery.   He now presents back to [facility] with a complaint of chest pain and DOE. His wife reports that she was at work and her daughter called her around 7:00 am reporting that he complained of SOB walking from room to room. There were also reports of CP. He has had CP off and on for the last several days, relieved with SL NTG. Given his symptoms, his wife brought him to the ED.   EKG is negative for ST elevations/depressions. POC troponin is negative. D-dimer and BNP (22) are also both negative, ruling out PE and acute CHF as potential etiologies. BP has been well controlled in the ED. BUN is 41 but SCr is WNL at 1.08. His hgb is stable at 12.2 (12.9 2 weeks ago). White count is slightly abnormal at 10.7. CXR is unremarkable. His wife also notes that he has had recent n/v/d. His father, whom he was around recently, also had similar symptoms. ? Possible viral GI illness.     Home Medications  Prior to Admission medications   Medication Sig Start Date End Date Taking? Authorizing Provider  aspirin EC 81 MG tablet Take 81 mg by mouth daily.   Yes Historical Provider, MD  atorvastatin (LIPITOR) 20 MG tablet Take 1 tablet every other day. 08/08/15  Yes Jodelle Gross, NP  carvedilol (COREG) 25 MG tablet Take 25 mg by mouth 2 (two) times daily with a meal.     Yes Historical Provider, MD  clopidogrel (PLAVIX) 75 MG tablet Take 1 tablet (75 mg total) by mouth daily. 07/21/15   Yes Ok Anis, NP  FLUoxetine (PROZAC) 20 MG capsule Take 20 mg by mouth at bedtime.  04/20/14  Yes Historical Provider, MD  fluticasone (FLONASE) 50 MCG/ACT nasal spray Place 1 spray into both nostrils daily as needed for allergies.  04/20/14  Yes Historical Provider, MD  furosemide (LASIX) 20 MG tablet Take 20 mg by mouth daily.  04/20/14  Yes Historical Provider, MD  isosorbide mononitrate (IMDUR) 30 MG 24 hr tablet Take 0.5 tablets (15 mg total) by mouth daily. 08/06/15  Yes Jodelle Gross, NP  loratadine (CLARITIN) 10 MG  tablet Take 10 mg by mouth daily.   Yes Historical Provider, MD  nitroGLYCERIN (NITROSTAT) 0.4 MG SL tablet Place 1 tablet (0.4 mg total) under the tongue every 5 (five) minutes as needed for chest pain. 07/21/15  Yes Ok Anis, NP  pantoprazole (PROTONIX) 40 MG tablet Take 1 tablet (40 mg total) by mouth daily. 07/21/15  Yes Ok Anis, NP    Family History  Family History  Problem Relation Age of Onset  . Heart attack Mother   . Heart attack Father   . Coronary artery disease Neg Hx     Siblings    Social History  Social History   Social History  . Marital Status: Married    Spouse Name: N/A  . Number of Children: N/A  . Years of Education: N/A   Occupational History  . Not on file.   Social History Main Topics  . Smoking status: Never Smoker   . Smokeless tobacco: Never Used  . Alcohol Use: No  . Drug Use: No  . Sexual Activity: Not on file   Other Topics Concern  . Not on file   Social History Narrative     Review of Systems General:  No chills, fever, night sweats or weight changes.  Cardiovascular:  + chest pain, + dyspnea on exertion, no edema, orthopnea, palpitations, paroxysmal nocturnal dyspnea. Dermatological: No rash, lesions/masses Respiratory: No cough, dyspnea Urologic: No hematuria, dysuria Abdominal:   No nausea, vomiting, diarrhea, bright red blood per rectum, melena, or hematemesis Neurologic:   No visual changes, wkns, changes in mental status. All other systems reviewed and are otherwise negative except as noted above.  Physical Exam  Blood pressure 124/83, pulse 104, temperature 98.2 F (36.8 C), temperature source Oral, resp. rate 16, height  (1.905 m), weight 255 lb (115.667 kg), SpO2 97 %.  General: Pleasant, NAD  Psych: goofy/odd behavior from brain injury Neuro: Alert and oriented X 3. Moves all extremities spontaneously. HEENT: Normal  Neck: Supple without bruits or JVD. Lungs:  Resp regular and unlabored, CTA. Heart: RRR no s3, s4, or murmurs. Abdomen: Soft, non-tender, non-distended, BS + x 4.  Extremities: No clubbing, cyanosis or edema. DP/PT/Radials 2+ and equal bilaterally.  Labs  Troponin (Point of Care Test)  Recent Labs  08/10/15 1053  TROPIPOC 0.01   No results for input(s): CKTOTAL, CKMB, TROPONINI in the last 72 hours. Lab Results  Component Value Date   WBC 10.7* 08/10/2015   HGB 12.2* 08/10/2015   HCT 35.4* 08/10/2015   MCV 84.9 08/10/2015   PLT 263 08/10/2015    Recent Labs Lab 08/10/15 1019  NA 143  K 4.4  CL 109  CO2 24  BUN 41*  CREATININE 1.08  CALCIUM 9.2  PROT 6.4*  BILITOT 0.7  ALKPHOS 106  ALT 19  AST 17  GLUCOSE 157*   Lab Results  Component Value Date   CHOL 148 07/16/2009   HDL 40.70 07/16/2009   LDLCALC 91 07/16/2009   TRIG 80.0 07/16/2009   Lab Results  Component Value Date   DDIMER 0.31 08/10/2015     Radiology/Studies  Dg Chest 2 View  08/10/2015  CLINICAL DATA:  Right-sided chest pain and shortness of breath. EXAM: CHEST  2 VIEW COMPARISON:  12/01/2008 FINDINGS: The heart size and mediastinal contours are within normal limits. There is coronary artery atherosclerosis. Both lungs are clear. The visualized skeletal structures are unremarkable. IMPRESSION: No active cardiopulmonary disease. Electronically Signed   By: Alan Ripper  Patel   On: 08/10/2015 10:47   Nm Myocar Multi W/spect W/wall Motion /  Ef  07/17/2015   No diagnostic ST segment changes to indicate ischemia.  Large, moderate intensity, partially reversible anterolateral defect consistent with scar and at least moderate peri-infarct ischemia.  This is a high risk study.  Nuclear stress EF: 43%. Anterolateral hypokinesis noted.     ECG  NSR. No acute ST abnormalities   ASSESSMENT AND PLAN  1. DOE + CP in the setting of known CAD: patient is s/p recent PCI +DES to LAD for ISR of previously placed stent. This was less than 4 weeks ago. His EKGs show no ST elevations/depressions and POC troponin is negative, making acute in-stent thrombosis unlikely. Additional w/u including D-dimer is negative, ruling out PE. BNP is normal at 22, ruling out acute CHF. EF also normal by recent echo. His recent cath report has been reviewed. There was no residual LAD disease following PCI. The LCx had only minimal luminal irregularities. The OM1 is subtotally occluded but with left to left collaterals coming from the apical LAD. The RCA had only 50% stenosis in the proximal portion. He has been fully complaint with DAPT.  He is unable to give a history given his anoxic brain injury, however he currently appears stable and in no acute distress. He is joking/laughing and ready to go home. He was prescribed 15 mg of Imdur at his last OV 08/06/15, but has not yet started taking this as they are waiting on mail order pharmacy delivery. His initial troponin was checked at 10:53 AM. It is reasonable to d/c home from the ED if f/u lab troponin is negative. Plan to optimize medical therapy with start of Imdur, as initially recommended. He can f/u in the office for repeat assessment and further med titration. MD to assess.     Signed, Robbie Lis, PA-C 08/10/2015, 1:05 PM Patient seen and examined and history reviewed. Agree with above findings and plan. 55 yo WM with known CAD presents for evaluation of dyspnea. Recent admission with chest pain and  abnormal Myoview. Found to have in-stent LAD disease and this was stented with a DES with excellent result. Also had occluded OM1 with collaterals. This was a small < 2 mm vessel and medical therapy recommended. Recently noted by family to have some chest pain. Imdur ordered but not yet filled from mail order pharmacy. Today developed Nausea and vomiting and diarrhea. Felt SOB after multiple bouts of vomiting. No fever or chills. No chest pain.  On exam VSS. No JVD or bruits. Lungs clear CV without gallop or murmur Abd. Soft, some tenderness left upper abd. No edema  Labs reviewed Ecg without acute change. CXR clear.  Imp: 1.  atypical chest pain. He may have some angina related to OM occlusion. Agree with prior recommendations to add Imdur. Will check second troponin. If negative can be DC from ED. 2. N/V and diarrhea. Suspect viral gastroenteritis. Recommend bland diet. Drink plenty of fluids. 3. Dyspnea. No evidence of CHF or active ischemia.   Kerrin Markman [place], MDFACC 08/10/2015 2:41 PM

## 2015-08-10 NOTE — ED Notes (Signed)
Pt transported to xray 

## 2015-08-10 NOTE — ED Provider Notes (Signed)
CSN: [id number]     Arrival date & time 08/10/15  4098 History   First MD Initiated Contact with Patient 08/10/15 1012     Chief Complaint  Patient presents with  . Shortness of Breath     (Consider location/radiation/quality/duration/timing/severity/associated sxs/prior Treatment) HPI  55 year old male with history of ischemic myopathy coronary artery disease status post multiple stents most recently 3 weeks ago also present with hypertension and an anoxic brain injury which affects his memory. Patient's family relates history the patient had worsening dyspnea over the last 3 days and specifically over the last 6-8 hours had multiple symptoms of dyspnea on exertion that has not been relieved by nitroglycerin. He's also had increase wakening at night but they're unsure why he is doing that. No fevers, no worsening cough, he has had some left-sided tingling and possibly some left-sided chest pain. However this seems to be more related to after having nitroglycerin. At this time the patient is pain-free has a little remitting gagging sound almost seems reflexive in nature rather than a cough. No other associated symptoms.   Past Medical History  Diagnosis Date  . Ischemic cardiomyopathy     a. 06/2014 Echo: EF 60-65%.  Marland Kitchen CAD (coronary artery disease)     a. 2007 Ant MI/VF Arrest/PCI LAD w/ BMS;  b. 07/2015 Cath/PCI: LM nl, LAD 80 ISR (2.5x28 Synergy DES), LCX min irregs, OM1 99, RCA 50p.  . Ventricular fibrillation (HCC)     a. 2007->VF Arrest.  . Hypertensive heart disease     unspecified  . Hyperlipidemia, mixed   . Anxiety   . Depression   . Dysphagia, unspecified(787.20)   . History of seizure disorder   . Brain anoxic injury (HCC)     a. 2007 in setting of VF arrest.  . GERD (gastroesophageal reflux disease)   . Memory loss     a. since VF arrest and anoxic brain injury in 2007.   Past Surgical History  Procedure Laterality Date  . Stent thrombosis  03/2006    Complicated my  sudden cardiac death  . Kidney stone surgery    . Cardiac catheterization  09/2005    with bare metal stent to the mid LAD (Minivision 2.5 x 18 mm) with Provisional balloon angioplasty to the second diagonal.  . Cardiac catheterization  03/2006    with stenting of the mid LAD with Taxus 2.5 x 20 mm and 2.5 x 8 mm stents.      . Bedside tracheostomy      with #6 Shiley. 04/14/2006  . Esophagogastroduodenoscopy    . Peg tube placement    . Cardiac catheterization N/A 07/20/2015    Procedure: Left Heart Cath and Coronary Angiography;  Surgeon: Corky Crafts, MD;  Location: [facility] INVASIVE CV LAB;  Service: Cardiovascular;  Laterality: N/A;  . Cardiac catheterization N/A 07/20/2015    Procedure: Coronary Stent Intervention;  Surgeon: Corky Crafts, MD;  Location: [facility] INVASIVE CV LAB;  Service: Cardiovascular;  Laterality: N/A;  . Tracheostomy closure    . Peg tube removal    . Coronary stent placement  07/20/2015    LAD with DES   Family History  Problem Relation Age of Onset  . Heart attack Mother   . Heart attack Father   . Coronary artery disease Neg Hx     Siblings   Social History  Substance Use Topics  . Smoking status: Never Smoker   . Smokeless tobacco: Never Used  . Alcohol Use: No  Review of Systems  Constitutional: Negative for fever and chills.  Eyes: Negative for photophobia and pain.  Respiratory: Positive for cough and shortness of breath. Negative for chest tightness and wheezing.   Cardiovascular: Negative for chest pain and palpitations.  Gastrointestinal: Negative for abdominal distention.  Genitourinary: Negative for dysuria and flank pain.  All other systems reviewed and are negative.     Allergies  Atorvastatin; Hydromorphone hcl; Lorazepam; Pseudoephedrine; and Terfenadine  Home Medications   Prior to Admission medications   Medication Sig Start Date End Date Taking? Authorizing Provider  aspirin EC 81 MG tablet Take 81 mg by mouth daily.    Yes Historical Provider, MD  atorvastatin (LIPITOR) 20 MG tablet Take 1 tablet every other day. 08/08/15  Yes Jodelle Gross, NP  carvedilol (COREG) 25 MG tablet Take 25 mg by mouth 2 (two) times daily with a meal.     Yes Historical Provider, MD  clopidogrel (PLAVIX) 75 MG tablet Take 1 tablet (75 mg total) by mouth daily. 07/21/15  Yes Ok Anis, NP  FLUoxetine (PROZAC) 20 MG capsule Take 20 mg by mouth at bedtime.  04/20/14  Yes Historical Provider, MD  fluticasone (FLONASE) 50 MCG/ACT nasal spray Place 1 spray into both nostrils daily as needed for allergies.  04/20/14  Yes Historical Provider, MD  furosemide (LASIX) 20 MG tablet Take 20 mg by mouth daily.  04/20/14  Yes Historical Provider, MD  isosorbide mononitrate (IMDUR) 30 MG 24 hr tablet Take 0.5 tablets (15 mg total) by mouth daily. 08/06/15  Yes Jodelle Gross, NP  loratadine (CLARITIN) 10 MG tablet Take 10 mg by mouth daily.   Yes Historical Provider, MD  nitroGLYCERIN (NITROSTAT) 0.4 MG SL tablet Place 1 tablet (0.4 mg total) under the tongue every 5 (five) minutes as needed for chest pain. 07/21/15  Yes Ok Anis, NP  pantoprazole (PROTONIX) 40 MG tablet Take 1 tablet (40 mg total) by mouth daily. 07/21/15  Yes Ok Anis, NP   BP 122/93 mmHg  Pulse 98  Temp(Src) 98.2 F (36.8 C) (Oral)  Resp 26  Ht  (1.905 m)  Wt 255 lb (115.667 kg)  BMI 31.87 kg/m2  SpO2 97% Physical Exam  Constitutional: He appears well-developed and well-nourished.  HENT:  Head: Normocephalic and atraumatic.  Neck: Normal range of motion.  Cardiovascular: Normal rate.   Pulmonary/Chest: Effort normal. No respiratory distress.  Abdominal: He exhibits no distension.  Musculoskeletal: Normal range of motion.  Neurological: He is alert.  Nursing note and vitals reviewed.   ED Course  Procedures (including critical care time) Labs Review Labs Reviewed  CBC WITH DIFFERENTIAL/PLATELET - Abnormal; Notable for the  following:    WBC 10.7 (*)    RBC 4.17 (*)    Hemoglobin 12.2 (*)    HCT 35.4 (*)    Neutro Abs 8.6 (*)    All other components within normal limits  COMPREHENSIVE METABOLIC PANEL - Abnormal; Notable for the following:    Glucose, Bld 157 (*)    BUN 41 (*)    Total Protein 6.4 (*)    All other components within normal limits  D-DIMER, QUANTITATIVE (NOT AT [facility])  BRAIN NATRIURETIC PEPTIDE  TROPONIN I  I-STAT TROPOININ, ED  Rosezena Sensor, ED    Imaging Review Dg Chest 2 View  08/10/2015  CLINICAL DATA:  Right-sided chest pain and shortness of breath. EXAM: CHEST  2 VIEW COMPARISON:  12/01/2008 FINDINGS: The heart size and mediastinal contours are  within normal limits. There is coronary artery atherosclerosis. Both lungs are clear. The visualized skeletal structures are unremarkable. IMPRESSION: No active cardiopulmonary disease. Electronically Signed   By: Elige Ko   On: 08/10/2015 10:47   I have personally reviewed and evaluated these images and lab results as part of my medical decision-making.   EKG Interpretation   Date/Time:  Friday August 10 2015 09:54:50 EST Ventricular Rate:  84 PR Interval:  170 QRS Duration: 106 QT Interval:  371 QTC Calculation: 438 R Axis:   28 Text Interpretation:  Sinus rhythm Baseline wander in lead(s) V1 No  significant change since last tracing 07/21/2015 Confirmed by [facility] MD,  Barbara Cower [phone]) on 08/10/2015 11:32:17 AM      MDM   Final diagnoses:  SOB (shortness of breath)   Dyspnea on exertion after stent a few days ago questionable chest pain as well. Concern for possible pulmonary embolus versus heart failure versus stent complication. We'll evaluate properly. Asymptomatic at rest.   Initial labs negative. Cardiology consulted and felt it to be unlikely related to cardiac problems, recommended 2nd troponin and if negative, dc with scheduled cardiology follow up.     Marily Memos, MD 08/10/15 [phone]

## 2015-08-10 NOTE — ED Notes (Signed)
Per EMS pt from home, family called out for shortness of breath and N/V/D x 3 days. Patient woke up daughter this morning with shortness of breath, gasping for air with difficulty making full sentences and had one episode of vomiting. Daughter gave 1 0.4mg  SL nitro with relief of shortness of breath. Patient attempted to walk to bathroom and got ShOB again and begin to c/o lightheadedness, tingling all over, and cold chills. Daughter administered 2nd SL nitro. Patient had another episode of vomiting. Patient has short-term memory loss and mental state and orientation is at baseline. Patient denies any symptoms at present time.   Patient had  of IV zofran administered for nausea.   Cardiac stent placed 3 weeks ago- patient followed up with cards on Monday due to increased shortness of breath. Imdur was ordered but has not been delivered by pharmacy yet. Patient family instructed to given SL nitro for shortness of breath as needed.

## 2015-08-15 DIAGNOSIS — R200 Anesthesia of skin: Secondary | ICD-10-CM | POA: Insufficient documentation

## 2015-08-16 ENCOUNTER — Ambulatory Visit (INDEPENDENT_AMBULATORY_CARE_PROVIDER_SITE_OTHER): Payer: 59 | Admitting: Neurology

## 2015-08-16 ENCOUNTER — Encounter: Payer: Self-pay | Admitting: Neurology

## 2015-08-16 VITALS — BP 121/71 | HR 66 | Ht 75.0 in | Wt 256.0 lb

## 2015-08-16 DIAGNOSIS — R200 Anesthesia of skin: Secondary | ICD-10-CM | POA: Diagnosis not present

## 2015-08-16 DIAGNOSIS — I25110 Atherosclerotic heart disease of native coronary artery with unstable angina pectoris: Secondary | ICD-10-CM | POA: Diagnosis not present

## 2015-08-16 DIAGNOSIS — R202 Paresthesia of skin: Secondary | ICD-10-CM

## 2015-08-16 MED ORDER — ALPRAZOLAM 1 MG PO TABS: 1.0000 mg | ORAL_TABLET | Freq: Every evening | ORAL | Status: DC | PRN

## 2015-08-16 NOTE — Patient Instructions (Signed)
Zantac  twice a day  Tum

## 2015-08-16 NOTE — Progress Notes (Signed)
PATIENT: [name] Cummings DOB: August 22, 1960  Chief Complaint  Patient presents with  . Left-sided weakness    He is here with his wife, Eather Colas and his mother-in-law.  He has been having weakness in his left leg.  . Numbness    He is having numbness/tingling in his upper and lower extremities.  . Memory Loss    MMSE 16/30 - 15 animals. They have concerns about his worsening memory.     HISTORICAL  [name] Cummings is 55 years old right-handed male, seen in refer by his primary care physician Dr. Doristine Counter for evaluation of left-sided weakness and pain, chronic memory loss, he is accompanied by his wife, and mother-in-law at today's clinical visit.  He suffered heart attack October 2007, ventricular fibrillation, suffered prolonged anoxic brain injury, had persistent memory loss ever since. He also had a history of hypertension, hyperlipidemia,  he had stent for his coronary artery disease. Most recent was early stent to left anterior descending in January 20 first 2017.  He had 14 years of education, worked as a Scientist, water quality.  He complained of left-sided discomfort since August 06 2015, initially presented with difficulty breathing, nausea or diarrhea, was taken to the hospital, have reviewed the laboratory, negative troponin, normal CBC with exception of mild anemia hemoglobin 12.4, normal CMP.  Over the past few days, he continue complains of left-sided numbness tingling mainly involving his left arm, no gait difficulty, no left arm weakness, left chest discomfort strip-like spreading from left breast to left lower abdomen.   At baseline, patient can carry on simple conversation, helping with her laundry, dressing, bathing, eating, toileting independently no trouble  REVIEW OF SYSTEMS: Full 14 system review of systems performed and notable only for fever, chill, weight loss, fatigue, shortness of breath, diarrhea, incontinence, feeling cold, increased thirst, achy muscles, memory loss,  numbness, weakness, sleepiness, snoring  ALLERGIES: Allergies  Allergen Reactions  . Atorvastatin     REACTION: Reaction not known  . Hydromorphone Hcl     REACTION: Reaction not known  . Lorazepam     REACTION: Reaction not known  . Pseudoephedrine     REACTION: Reaction not known  . Terfenadine     REACTION: Reaction not known    HOME MEDICATIONS: Current Outpatient Prescriptions  Medication Sig Dispense Refill  . aspirin EC 81 MG tablet Take 81 mg by mouth daily.    Marland Kitchen atorvastatin (LIPITOR) 20 MG tablet Take 1 tablet every other day. 90 tablet 3  . carvedilol (COREG) 25 MG tablet Take 25 mg by mouth 2 (two) times daily with a meal.      . clopidogrel (PLAVIX) 75 MG tablet Take 1 tablet (75 mg total) by mouth daily. 30 tablet 6  . FLUoxetine (PROZAC) 20 MG capsule Take 20 mg by mouth at bedtime.     . fluticasone (FLONASE) 50 MCG/ACT nasal spray Place 1 spray into both nostrils daily as needed for allergies.     . furosemide (LASIX) 20 MG tablet Take 20 mg by mouth daily.     . isosorbide mononitrate (IMDUR) 30 MG 24 hr tablet Take 0.5 tablets (15 mg total) by mouth daily. 90 tablet 3  . loratadine (CLARITIN) 10 MG tablet Take 10 mg by mouth daily.    . nitroGLYCERIN (NITROSTAT) 0.4 MG SL tablet Place 1 tablet (0.4 mg total) under the tongue every 5 (five) minutes as needed for chest pain. 25 tablet 3  . pantoprazole (PROTONIX) 40 MG tablet Take  1 tablet (40 mg total) by mouth daily. 30 tablet 6   No current facility-administered medications for this visit.    PAST MEDICAL HISTORY: Past Medical History  Diagnosis Date  . Ischemic cardiomyopathy     a. 06/2014 Echo: EF 60-65%.  Marland Kitchen CAD (coronary artery disease)     a. 2007 Ant MI/VF Arrest/PCI LAD w/ BMS;  b. 07/2015 Cath/PCI: LM nl, LAD 80 ISR (2.5x28 Synergy DES), LCX min irregs, OM1 99, RCA 50p.  . Ventricular fibrillation (HCC)     a. 2007->VF Arrest.  . Hypertensive heart disease     unspecified  . Hyperlipidemia,  mixed   . Anxiety   . Depression   . Dysphagia, unspecified(787.20)   . History of seizure disorder   . Brain anoxic injury (HCC)     a. 2007 in setting of VF arrest.  . GERD (gastroesophageal reflux disease)   . Memory loss     a. since VF arrest and anoxic brain injury in 2007.    PAST SURGICAL HISTORY: Past Surgical History  Procedure Laterality Date  . Stent thrombosis  03/2006    Complicated my sudden cardiac death  . Kidney stone surgery    . Cardiac catheterization  09/2005    with bare metal stent to the mid LAD (Minivision 2.5 x 18 mm) with Provisional balloon angioplasty to the second diagonal.  . Cardiac catheterization  03/2006    with stenting of the mid LAD with Taxus 2.5 x 20 mm and 2.5 x 8 mm stents.      . Bedside tracheostomy      with #6 Shiley. 04/14/2006  . Esophagogastroduodenoscopy    . Peg tube placement    . Cardiac catheterization N/A 07/20/2015    Procedure: Left Heart Cath and Coronary Angiography;  Surgeon: Corky Crafts, MD;  Location: [facility] INVASIVE CV LAB;  Service: Cardiovascular;  Laterality: N/A;  . Cardiac catheterization N/A 07/20/2015    Procedure: Coronary Stent Intervention;  Surgeon: Corky Crafts, MD;  Location: [facility] INVASIVE CV LAB;  Service: Cardiovascular;  Laterality: N/A;  . Tracheostomy closure    . Peg tube removal    . Coronary stent placement  07/20/2015    LAD with DES    FAMILY HISTORY: Family History  Problem Relation Age of Onset  . Heart attack Mother   . Heart attack Father   . Coronary artery disease Neg Hx     Siblings    SOCIAL HISTORY:  Social History   Social History  . Marital Status: Married    Spouse Name: N/A  . Number of Children: 4  . Years of Education: 12   Occupational History  . Unemployed    Social History Main Topics  . Smoking status: Never Smoker   . Smokeless tobacco: Never Used  . Alcohol Use: No  . Drug Use: No  . Sexual Activity: Not on file   Other Topics Concern  .  Not on file   Social History Narrative   Lives at home with wife.   Right-handed.   No caffeine use.        PHYSICAL EXAM   Filed Vitals:   08/16/15 1123  BP: 121/71  Pulse: 66  Height:  (1.905 m)  Weight: 256 lb (116.121 kg)    Not recorded      Body mass index is 32 kg/(m^2).  PHYSICAL EXAMNIATION:  Gen: NAD, conversant, well nourised, obese, well groomed  Cardiovascular: Regular rate rhythm, no peripheral edema, warm, nontender. Eyes: Conjunctivae clear without exudates or hemorrhage Neck: Supple, no carotid bruise. Pulmonary: Clear to auscultation bilaterally   NEUROLOGICAL EXAM:  MENTAL STATUS: Speech:    Speech is normal; fluent and spontaneous with normal comprehension.  Cognition: Mini-Mental Status Examination is 16/30, animal naming 15     Orientation to time, place and person: He is not oriented to date, year, month, day, name of clinic    Recent and remote memory: He missed 3 out of 3 recall     Normal Attention span and concentration     Normal Language, naming, repeating,spontaneous speech, he could not complete a sentence     Fund of knowledge   CRANIAL NERVES: CN II: Visual fields are full to confrontation. Fundoscopic exam is normal with sharp discs and no vascular changes. Pupils are round equal and briskly reactive to light. CN III, IV, VI: extraocular movement are normal. No ptosis. CN V: Facial sensation is intact to pinprick in all 3 divisions bilaterally. Corneal responses are intact.  CN VII: Face is symmetric with normal eye closure and smile. CN VIII: Hearing is normal to rubbing fingers CN IX, X: Palate elevates symmetrically. Phonation is normal. CN XI: Head turning and shoulder shrug are intact CN XII: Tongue is midline with normal movements and no atrophy.  MOTOR: There is no pronator drift of out-stretched arms. Muscle bulk and tone are normal. Muscle strength is normal.  REFLEXES: Reflexes are 2+ and  symmetric at the biceps, triceps, knees, and ankles. Plantar responses are flexor.  SENSORY: Intact to light touch, pinprick, position sense, and vibration sense are intact in fingers and toes.  COORDINATION: Rapid alternating movements and fine finger movements are intact. There is no dysmetria on finger-to-nose and heel-knee-shin.    GAIT/STANCE: Posture is normal. Gait is steady with normal steps, base, arm swing, and turning. Heel and toe walking are normal. Tandem gait is normal.  Romberg is absent.   DIAGNOSTIC DATA (LABS, IMAGING, TESTING) - I reviewed patient records, labs, notes, testing and imaging myself where available.   ASSESSMENT AND PLAN  [name] Cummings is a 55 y.o. male   History of anoxic brain injury, memory loss, Mini-Mental Status 16/30 Coronary artery disease New onset left chest discomfort, left-sided paresthesia  Differentiated seen diagnosis including right hemisphere pathology, versus GI complaints, he is a poor historian  Proceed with MRI of the brain  Keep antiplatelet agent   Levert Feinstein, M.D. Ph.D.  [facility] [address] [place], [place] [address] Ph: [phone] Fax: [phone]  CC: [name], MD

## 2015-08-26 ENCOUNTER — Ambulatory Visit
Admission: RE | Admit: 2015-08-26 | Discharge: 2015-08-26 | Disposition: A | Discharge status: Home / Self Care | Payer: 59 | Source: Ambulatory Visit | Attending: Neurology | Admitting: Neurology

## 2015-08-26 DIAGNOSIS — I25110 Atherosclerotic heart disease of native coronary artery with unstable angina pectoris: Secondary | ICD-10-CM

## 2015-08-26 DIAGNOSIS — R200 Anesthesia of skin: Secondary | ICD-10-CM | POA: Diagnosis not present

## 2015-08-26 DIAGNOSIS — R202 Paresthesia of skin: Secondary | ICD-10-CM

## 2015-08-27 ENCOUNTER — Telehealth: Payer: Self-pay | Admitting: Neurology

## 2015-08-27 NOTE — Telephone Encounter (Signed)
Please call patient, MRI of the brain showed evidence of atrophy, enlarged ventricle, which is consistent with his history of anoxic brain injury. I will review findings with him in detail at next follow-up visit  IMPRESSION: This MRI of the brain without contrast shows the following: 1. Moderate cortical atrophy and moderately severe ventriculomegaly, slightly more than what was noted on CT scan 09/18/2006, likely due to the anoxic encephalopathy injury. 2. A few scattered T2/FLAIR hyperintense foci consistent with minimal chronic microvascular ischemic change. 3. There are no acute findings.

## 2015-08-28 NOTE — Telephone Encounter (Signed)
Spoke to his wife, Eather Colas (wife on HIPPA) - she is aware of results.  They will keep his pending follow up appt.

## 2015-09-04 ENCOUNTER — Encounter (HOSPITAL_COMMUNITY)
Admission: RE | Admit: 2015-09-04 | Discharge: 2015-09-04 | Disposition: A | Discharge status: Home / Self Care | Payer: 59 | Source: Ambulatory Visit | Attending: Cardiology | Admitting: Cardiology

## 2015-09-04 ENCOUNTER — Encounter (HOSPITAL_COMMUNITY): Payer: Self-pay

## 2015-09-04 VITALS — BP 114/71 | HR 69 | Ht 75.0 in | Wt 248.4 lb

## 2015-09-04 DIAGNOSIS — Z955 Presence of coronary angioplasty implant and graft: Secondary | ICD-10-CM

## 2015-09-04 DIAGNOSIS — I214 Non-ST elevation (NSTEMI) myocardial infarction: Secondary | ICD-10-CM | POA: Insufficient documentation

## 2015-09-04 NOTE — Patient Instructions (Signed)
Pt has finished orientation and is scheduled to return CR on 09/07/15 at 345pm. Pt has been instructed to arrive to class 15 minutes early for scheduled class. Pt has been instructed to wear comfortable clothing and shoes with rubber soles. Pt has been told to take their medications 1 hour prior to coming to class.  If the patient is not going to attend class, he has been instructed to call.

## 2015-09-04 NOTE — Progress Notes (Signed)
Cardiac/Pulmonary Rehab Medication Review by a Pharmacist  Does the patient  feel that his/her medications are working for him/her?  yes  Has the patient been experiencing any side effects to the medications prescribed?  no  Does the patient measure his/her own blood pressure or blood glucose at home?  no   Does the patient have any problems obtaining medications due to transportation or finances?   no  Understanding of regimen: good Understanding of indications: good Potential of compliance: good  Questions asked to Determine Patient Understanding of Medication Regimen:  1. What is the name of the medication?  2. What is the medication used for?  3. When should it be taken?  4. How much should be taken?  5. How will you take it?  6. What side effects should you report?  Understanding Defined as: Excellent: All questions above are correct Good: Questions 1-4 are correct Fair: Questions 1-2 are correct  Poor: 1 or none of the above questions are correct   Pharmacist comments: Pt does not c/o any side effects any longer.  Pt did c/o myalgias with atorvastatin but when dose was cut in 1/2 no longer c/o side effects.  Pt does not check BP at home.    Valrie HartHall, Jenny Omdahl A 09/04/2015 2:58 PM

## 2015-09-05 DIAGNOSIS — Z0000 Encounter for general adult medical examination without abnormal findings: Secondary | ICD-10-CM | POA: Insufficient documentation

## 2015-09-06 NOTE — Progress Notes (Signed)
Cardiac Individual Treatment Plan  Patient Details  Name: [name] Cummings MRN: [id number] Date of Birth: 10/08/1960 Referring Provider:  Antoine PocheBranch, Jonathan F, MD  Initial Encounter Date:       CARDIAC REHAB PHASE II ORIENTATION from 09/04/2015 [facility]   Date  09/04/15      Visit Diagnosis: Stented coronary artery  Patient's Home Medications on Admission:  Current outpatient prescriptions:  .  ranitidine (ZANTAC) 150 MG tablet, Take 150 mg by mouth 2 (two) times daily., Disp: , Rfl:  .  aspirin EC 81 MG tablet, Take 81 mg by mouth daily., Disp: , Rfl:  .  atorvastatin (LIPITOR) 20 MG tablet, Take 1 tablet every other day. (Patient taking differently: 10 mg daily at 6 PM. Take 1 tablet every other day.), Disp: 90 tablet, Rfl: 3 .  carvedilol (COREG) 25 MG tablet, Take 25 mg by mouth 2 (two) times daily with a meal.  , Disp: , Rfl:  .  clopidogrel (PLAVIX) 75 MG tablet, Take 1 tablet (75 mg total) by mouth daily., Disp: 30 tablet, Rfl: 6 .  FLUoxetine (PROZAC) 20 MG capsule, Take 20 mg by mouth at bedtime. , Disp: , Rfl:  .  fluticasone (FLONASE) 50 MCG/ACT nasal spray, Place 1 spray into both nostrils daily as needed for allergies. , Disp: , Rfl:  .  furosemide (LASIX) 20 MG tablet, Take 20 mg by mouth daily. , Disp: , Rfl:  .  isosorbide mononitrate (IMDUR) 30 MG 24 hr tablet, Take 0.5 tablets (15 mg total) by mouth daily., Disp: 90 tablet, Rfl: 3 .  loratadine (CLARITIN) 10 MG tablet, Take 10 mg by mouth daily., Disp: , Rfl:  .  nitroGLYCERIN (NITROSTAT) 0.4 MG SL tablet, Place 1 tablet (0.4 mg total) under the tongue every 5 (five) minutes as needed for chest pain., Disp: 25 tablet, Rfl: 3 .  pantoprazole (PROTONIX) 40 MG tablet, Take 1 tablet (40 mg total) by mouth daily., Disp: 30 tablet, Rfl: 6  Past Medical History: Past Medical History  Diagnosis Date  . Ischemic cardiomyopathy     a. 06/2014 Echo: EF 60-65%.  Marland Kitchen. CAD (coronary artery disease)     a. 2007  Ant MI/VF Arrest/PCI LAD w/ BMS;  b. 07/2015 Cath/PCI: LM nl, LAD 80 ISR (2.5x28 Synergy DES), LCX min irregs, OM1 99, RCA 50p.  . Ventricular fibrillation (HCC)     a. 2007->VF Arrest.  . Hypertensive heart disease     unspecified  . Hyperlipidemia, mixed   . Anxiety   . Depression   . Dysphagia, unspecified(787.20)   . History of seizure disorder   . Brain anoxic injury (HCC)     a. 2007 in setting of VF arrest.  . GERD (gastroesophageal reflux disease)   . Memory loss     a. since VF arrest and anoxic brain injury in 2007.    Tobacco Use: History  Smoking status  . Current Every Day Smoker -- 30 years  Smokeless tobacco  . Former NeurosurgeonUser  . Types: Chew  . Quit date: 09/03/2005    Labs:     Recent Review Flowsheet Data    Labs for ITP Cardiac and Pulmonary Rehab Latest Ref Rng 06/17/2007 04/21/2008 12/01/2008 12/02/2008 07/16/2009   Cholestrol 0-200 mg/dL 782124 ATP III CLASSIFICATION: <200     mg/dL   Desirable 956-213200-239  mg/dL   Borderline High >=086>=240    mg/dL   High 578139 - 469149 ATP III CLASSIFICATION: <200  mg/dL   Desirable 086-578  mg/dL   Borderline High >=469    mg/dL   High 629   LDLCALC 5-28 mg/dL 82 Total Cholesterol/HDL:CHD Risk Coronary Heart Disease Risk Table Men   Women 1/2 Average Risk   3.4   3.3 82 - 83 Total Cholesterol/HDL:CHD Risk Coronary Heart Disease Risk Table Men   Women 1/2 Average Risk   3.4   3.3 Average Risk       5.0   4.4 2 X Average Risk   9.6   7.1 3 X Average Risk  23.4   11.0 Use the calculated Patient Ratio above and the CHD Risk Table to determine the patient's CHD Risk. ATP III CLASSIFICATION (LDL): <100     mg/dL   Optimal 413-244  mg/dL   Near or Above Optimal 130-159  mg/dL   Borderline 010-272  mg/dL   High >536     mg/dL   Very High 91   HDL >64.40 mg/dL 34(V) 38.5(L) - 29(L) 40.70   Trlycerides 0.0-149.0 mg/dL 61 94 - 425(Z) 56.3   Hemoglobin A1c 4.6 - 6.1 % - - 5.6 (NOTE) The ADA recommends the following therapeutic  goal for glycemic control related to Hgb A1c measurement: Goal of therapy: <6.5 Hgb A1c  Reference: American Diabetes Association: Clinical Practice Recommendations 2010, Diabetes Care, 2010, 33: (Suppl 1). - -   TCO2 0 - 100 mmol/L - - 25 - -      Capillary Blood Glucose: No results found for: GLUCAP   Exercise Target Goals: Date: 09/04/15  Exercise Program Goal: Individual exercise prescription set with THRR, safety & activity barriers. Participant demonstrates ability to understand and report RPE using BORG scale, to self-measure pulse accurately, and to acknowledge the importance of the exercise prescription.  Exercise Prescription Goal: Starting with aerobic activity 30 plus minutes a day, 3 days per week for initial exercise prescription. Provide home exercise prescription and guidelines that participant acknowledges understanding prior to discharge.  Activity Barriers & Risk Stratification:     Activity Barriers & Cardiac Risk Stratification - 09/04/15 1525    Activity Barriers & Cardiac Risk Stratification   Activity Barriers None   Cardiac Risk Stratification High      6 Minute Walk:     6 Minute Walk      09/04/15 1544       6 Minute Walk   Phase Initial     Distance 950 feet     Walk Time 6 minutes     # of Rest Breaks 0     MPH 1.8     METS 2.38     RPE 11     Perceived Dyspnea  11     VO2 Peak 10.28     Symptoms No     Resting HR 67 bpm     Resting BP 114/72 mmHg     Max Ex. HR 85 bpm     Max Ex. BP 140/80 mmHg     Pre/Post BP   Baseline BP 114/72 mmHg     6 Minute BP 140/80 mmHg     2 Minute Post BP 124/86 mmHg     Pre/Post BP? Yes        Initial Exercise Prescription:     Initial Exercise Prescription - 09/04/15 1600    Date of Initial Exercise Prescription   Date 09/04/15   Treadmill   MPH 1.5   Grade 0   Minutes 15   METs 2.15  NuStep   Level 2   Minutes 15   METs 1.5   Arm Ergometer   Level 1.5   Minutes 15   METs 1.5    Prescription Details   Frequency (times per week) 3   Intensity   THRR REST +  30   THRR 40-80% of Max Heartrate 107-146   Ratings of Perceived Exertion 11-13   Progression   Progression Continue to progress workloads to maintain intensity without signs/symptoms of physical distress.   Resistance Training   Training Prescription Yes   Weight 1.0   Reps 10-12      Perform Capillary Blood Glucose checks as needed.  Exercise Prescription Changes:   Discharge Exercise Prescription (Final Exercise Prescription Changes):   Nutrition:  Target Goals: Understanding of nutrition guidelines, daily intake of sodium 1500mg , cholesterol 200mg , calories 30% from fat and 7% or less from saturated fats, daily to have 5 or more servings of fruits and vegetables.  Biometrics:     Pre Biometrics - 09/04/15 1607    Pre Biometrics   Height  (1.905 m)   Weight 248 lb 6.4 oz (112.674 kg)   Waist Circumference 44 inches   Hip Circumference 44 inches   Waist to Hip Ratio 1 %   BMI (Calculated) 31.1   Triceps Skinfold 7 mm   % Body Fat 26.4 %   Grip Strength 102 kg   Flexibility 0 in   Single Leg Stand 30 seconds       Nutrition Therapy Plan and Nutrition Goals:     Nutrition Therapy & Goals - 09/04/15 1527    Intervention Plan   Intervention Nutrition handout(s) given to patient.   Expected Outcomes Short Term Goal: Understand basic principles of dietary content, such as calories, fat, sodium, cholesterol and nutrients.      Nutrition Discharge: Rate Your Plate Scores:     Nutrition Assessments - 09/04/15 1528    MEDFICTS Scores   Pre Score 9      Nutrition Goals Re-Evaluation:   Psychosocial: Target Goals: Acknowledge presence or absence of depression, maximize coping skills, provide positive support system. Participant is able to verbalize types and ability to use techniques and skills needed for reducing stress and depression.  Initial Review & Psychosocial  Screening:     Initial Psych Review & Screening - 09/04/15 1530    Initial Review   Current issues with --  none   Family Dynamics   Good Support System? Yes   Concerns --  none   Barriers   Psychosocial barriers to participate in program There are no identifiable barriers or psychosocial needs.   Screening Interventions   Interventions Encouraged to exercise      Quality of Life Scores:     Quality of Life - 09/04/15 1619    Quality of Life Scores   Health/Function Pre 1932 %   Socioeconomic Pre 26.3 %   Psych/Spiritual Pre 27.3 %   Family Pre 24.3 %   GLOBAL Pre 22.76 %      PHQ-9:     Recent Review Flowsheet Data    Depression screen [facility] 2/9 09/04/2015   Decreased Interest 0   Down, Depressed, Hopeless 0   PHQ - 2 Score 0   Altered sleeping 0   Tired, decreased energy 0   Change in appetite 0   Feeling bad or failure about yourself  0   Trouble concentrating 0   Moving slowly or fidgety/restless 0   Suicidal  thoughts 0   PHQ-9 Score 0   Difficult doing work/chores Not difficult at all      Psychosocial Evaluation and Intervention:     Psychosocial Evaluation - 09/04/15 1530    Psychosocial Evaluation & Interventions   Interventions Encouraged to exercise with the program and follow exercise prescription   Continued Psychosocial Services Needed No      Psychosocial Re-Evaluation:   Vocational Rehabilitation: Provide vocational rehab assistance to qualifying candidates.   Vocational Rehab Evaluation & Intervention:     Vocational Rehab - 09/04/15 1526    Initial Vocational Rehab Evaluation & Intervention   Assessment shows need for Vocational Rehabilitation No      Education: Education Goals: Education classes will be provided on a weekly basis, covering required topics. Participant will state understanding/return demonstration of topics presented.  Learning Barriers/Preferences:     Learning Barriers/Preferences - 09/04/15 1525     Learning Barriers/Preferences   Learning Barriers Inability to learn new things  patient has history of anoxic brain damage.    Learning Preferences Verbal Instruction      Education Topics: Hypertension, Hypertension Reduction -Define heart disease and high blood pressure. Discus how high blood pressure affects the body and ways to reduce high blood pressure.   Exercise and Your Heart -Discuss why it is important to exercise, the FITT principles of exercise, normal and abnormal responses to exercise, and how to exercise safely.   Angina -Discuss definition of angina, causes of angina, treatment of angina, and how to decrease risk of having angina.   Cardiac Medications -Review what the following cardiac medications are used for, how they affect the body, and side effects that may occur when taking the medications.  Medications include Aspirin, Beta blockers, calcium channel blockers, ACE Inhibitors, angiotensin receptor blockers, diuretics, digoxin, and antihyperlipidemics.   Congestive Heart Failure -Discuss the definition of CHF, how to live with CHF, the signs and symptoms of CHF, and how keep track of weight and sodium intake.   Heart Disease and Intimacy -Discus the effect sexual activity has on the heart, how changes occur during intimacy as we age, and safety during sexual activity.   Smoking Cessation / COPD -Discuss different methods to quit smoking, the health benefits of quitting smoking, and the definition of COPD.   Nutrition I: Fats -Discuss the types of cholesterol, what cholesterol does to the heart, and how cholesterol levels can be controlled.   Nutrition II: Labels -Discuss the different components of food labels and how to read food label   Heart Parts and Heart Disease -Discuss the anatomy of the heart, the pathway of blood circulation through the heart, and these are affected by heart disease.   Stress I: Signs and Symptoms -Discuss the causes  of stress, how stress may lead to anxiety and depression, and ways to limit stress.   Stress II: Relaxation -Discuss different types of relaxation techniques to limit stress.   Warning Signs of Stroke / TIA -Discuss definition of a stroke, what the signs and symptoms are of a stroke, and how to identify when someone is having stroke.   Knowledge Questionnaire Score:     Knowledge Questionnaire Score - 09/04/15 1526    Knowledge Questionnaire Score   Pre Score 21/24      Personal Goals and Risk Factors at Admission:     Personal Goals and Risk Factors at Admission - 09/04/15 1528    Core Components/Risk Factors/Patient Goals on Admission    Weight Management Yes  Intervention Weight Management: Provide education and appropriate resources to help participant work on and attain dietary goals.   Expected Outcomes Short Term: Continue to assess and modify interventions until short term weight is achieved.;Long Term: Adherence to nutrition and physical activity/exercise program aimed toward attainment of established weight goal.   Hypertension Yes   Intervention Monitor prescription use compliance.;Provide education on lifestyle modifcations including regular physical activity/exercise, weight management, moderate sodium restriction and increased consumption of fresh fruit, vegetables, and low fat dairy, alcohol moderation, and smoking cessation.   Expected Outcomes Short Term: Continued assessment and intervention until BP is < 140/53mm HG in hypertensive participants. < 130/81mm HG in hypertensive participants with diabetes, heart failure or chronic kidney disease.   Lipids Yes   Intervention Provide education and support for participant on nutrition & aerobic/resistive exercise along with prescribed medications to achieve LDL 70mg , HDL >40mg .   Expected Outcomes Short Term: Participant states understanding of desired cholesterol values and is compliant with medications prescribed.  Participant is following exercise prescription and nutrition guidelines.      Personal Goals and Risk Factors Review:    Personal Goals Discharge (Final Personal Goals and Risk Factors Review):    ITP Comments:     ITP Comments      09/04/15 1526           ITP Comments Patient is a 55 year old male that lives with his wife.  patient enjoys eating and playing ball with kids. patient humorous and high spirited. Has history of anoxic brain damage.  wife states he forgets things in 5 minutes.            Comments:

## 2015-09-07 ENCOUNTER — Encounter (HOSPITAL_COMMUNITY)
Admission: RE | Admit: 2015-09-07 | Discharge: 2015-09-07 | Disposition: A | Discharge status: Home / Self Care | Payer: 59 | Source: Ambulatory Visit | Attending: Cardiology | Admitting: Cardiology

## 2015-09-07 DIAGNOSIS — I214 Non-ST elevation (NSTEMI) myocardial infarction: Secondary | ICD-10-CM | POA: Diagnosis not present

## 2015-09-10 ENCOUNTER — Encounter (HOSPITAL_COMMUNITY)
Admission: RE | Admit: 2015-09-10 | Discharge: 2015-09-10 | Disposition: A | Discharge status: Home / Self Care | Payer: 59 | Source: Ambulatory Visit | Attending: Cardiology | Admitting: Cardiology

## 2015-09-10 DIAGNOSIS — I214 Non-ST elevation (NSTEMI) myocardial infarction: Secondary | ICD-10-CM | POA: Diagnosis not present

## 2015-09-12 ENCOUNTER — Encounter (HOSPITAL_COMMUNITY)
Admission: RE | Admit: 2015-09-12 | Discharge: 2015-09-12 | Disposition: A | Discharge status: Home / Self Care | Payer: 59 | Source: Ambulatory Visit | Attending: Cardiology | Admitting: Cardiology

## 2015-09-12 DIAGNOSIS — I214 Non-ST elevation (NSTEMI) myocardial infarction: Secondary | ICD-10-CM | POA: Diagnosis not present

## 2015-09-14 ENCOUNTER — Encounter (HOSPITAL_COMMUNITY)
Admission: RE | Admit: 2015-09-14 | Discharge: 2015-09-14 | Disposition: A | Discharge status: Home / Self Care | Payer: 59 | Source: Ambulatory Visit | Attending: Cardiology | Admitting: Cardiology

## 2015-09-14 DIAGNOSIS — I214 Non-ST elevation (NSTEMI) myocardial infarction: Secondary | ICD-10-CM | POA: Diagnosis not present

## 2015-09-17 ENCOUNTER — Encounter (HOSPITAL_COMMUNITY)
Admission: RE | Admit: 2015-09-17 | Discharge: 2015-09-17 | Disposition: A | Discharge status: Home / Self Care | Payer: 59 | Source: Ambulatory Visit | Attending: Cardiology | Admitting: Cardiology

## 2015-09-17 DIAGNOSIS — I214 Non-ST elevation (NSTEMI) myocardial infarction: Secondary | ICD-10-CM | POA: Diagnosis not present

## 2015-09-19 ENCOUNTER — Ambulatory Visit (INDEPENDENT_AMBULATORY_CARE_PROVIDER_SITE_OTHER): Payer: 59 | Admitting: Neurology

## 2015-09-19 ENCOUNTER — Encounter (HOSPITAL_COMMUNITY): Payer: 59

## 2015-09-19 ENCOUNTER — Encounter: Payer: Self-pay | Admitting: Neurology

## 2015-09-19 VITALS — BP 105/61 | HR 60 | Ht 75.0 in | Wt 248.0 lb

## 2015-09-19 DIAGNOSIS — G931 Anoxic brain damage, not elsewhere classified: Secondary | ICD-10-CM | POA: Diagnosis not present

## 2015-09-19 NOTE — Progress Notes (Signed)
Chief Complaint  Patient presents with  . Left-sided Paresthesia    He is here with his wife, Eather Colas, to discuss his MRI results.  . Memory Loss    Delores states his memory has been bad for ten years and does not see the benefit in repeating the MMSE.  His last score was 16/30 and named 15 animals.      PATIENT: [name] Cummings DOB: 02/04/1961  Chief Complaint  Patient presents with  . Left-sided Paresthesia    He is here with his wife, Eather Colas, to discuss his MRI results.  . Memory Loss    Delores states his memory has been bad for ten years and does not see the benefit in repeating the MMSE.  His last score was 16/30 and named 15 animals.     HISTORICAL  [name] Cummings is 55 years old right-handed male, seen in refer by his primary care physician Dr. Doristine Counter for evaluation of left-sided weakness and pain, chronic memory loss, he is accompanied by his wife, and mother-in-law at today's clinical visit.  He suffered heart attack October 2007, ventricular fibrillation, suffered prolonged anoxic brain injury, had persistent memory loss ever since. He also had a history of hypertension, hyperlipidemia,  he had stent for his coronary artery disease. Most recent was stent to left anterior descending in January 20 first 2017.  He had 14 years of education, worked as a Scientist, water quality.  He complained of left-sided discomfort since August 06 2015, initially presented with difficulty breathing, nausea or diarrhea, was taken to the hospital, have reviewed the laboratory, negative troponin, normal CBC with exception of mild anemia hemoglobin 12.4, normal CMP.  Over the past few days, he continue complains of left-sided numbness tingling mainly involving his left arm, no gait difficulty, no left arm weakness, left chest discomfort strip-like spreading from left breast to left lower abdomen.   At baseline, patient can carry on simple conversation, helping with her laundry, dressing, bathing, eating,  toileting independently no trouble  UPDATE September 19 2015: He continue to complains of intermittent left-sided discomfort, he has difficulty to elaborate on details, per wife, he had extensive GI, cardiac evaluation, failed to demonstrate etiology   We have reviewed MRI of the brain without contrast in March 2017 in comparison to multiple previous CAT scans, and MRI of the brain in 2008, there is evidence of mild progression of generalized brain atrophy, ventriculomegaly, no evidence of obstruction, mild supratentorium small vessel disease.   REVIEW OF SYSTEMS: Full 14 system review of systems performed and notable only for fever, chill, weight loss, fatigue, shortness of breath, diarrhea, incontinence, feeling cold, increased thirst, achy muscles, memory loss, numbness, weakness, sleepiness, snoring  ALLERGIES: Allergies  Allergen Reactions  . Hydromorphone Hcl     REACTION: Reaction not known  . Lorazepam     REACTION: Reaction not known  . Pseudoephedrine     REACTION: Reaction not known  . Terfenadine     REACTION: Reaction not known  . Atorvastatin     Pt c/o myalgias but was told to break tab in 1/2 and take it and has tolerated medication fine since dose was cut in 1/2    HOME MEDICATIONS: Current Outpatient Prescriptions  Medication Sig Dispense Refill  . aspirin EC 81 MG tablet Take 81 mg by mouth daily.    Marland Kitchen atorvastatin (LIPITOR) 10 MG tablet Take 10 mg by mouth daily.    . carvedilol (COREG) 25 MG tablet Take 25 mg by mouth  2 (two) times daily with a meal.      . clopidogrel (PLAVIX) 75 MG tablet Take 1 tablet (75 mg total) by mouth daily. 30 tablet 6  . FLUoxetine (PROZAC) 20 MG capsule Take 20 mg by mouth at bedtime.     . fluticasone (FLONASE) 50 MCG/ACT nasal spray Place 1 spray into both nostrils daily as needed for allergies.     . furosemide (LASIX) 20 MG tablet Take 20 mg by mouth daily.     . isosorbide mononitrate (IMDUR) 30 MG 24 hr tablet Take 0.5 tablets (15  mg total) by mouth daily. 90 tablet 3  . loratadine (CLARITIN) 10 MG tablet Take 10 mg by mouth daily.    . nitroGLYCERIN (NITROSTAT) 0.4 MG SL tablet Place 1 tablet (0.4 mg total) under the tongue every 5 (five) minutes as needed for chest pain. 25 tablet 3  . pantoprazole (PROTONIX) 40 MG tablet Take 1 tablet (40 mg total) by mouth daily. 30 tablet 6  . ranitidine (ZANTAC) 150 MG tablet Take 150 mg by mouth 2 (two) times daily.     No current facility-administered medications for this visit.    PAST MEDICAL HISTORY: Past Medical History  Diagnosis Date  . Ischemic cardiomyopathy     a. 06/2014 Echo: EF 60-65%.  Marland Kitchen CAD (coronary artery disease)     a. 2007 Ant MI/VF Arrest/PCI LAD w/ BMS;  b. 07/2015 Cath/PCI: LM nl, LAD 80 ISR (2.5x28 Synergy DES), LCX min irregs, OM1 99, RCA 50p.  . Ventricular fibrillation (HCC)     a. 2007->VF Arrest.  . Hypertensive heart disease     unspecified  . Hyperlipidemia, mixed   . Anxiety   . Depression   . Dysphagia, unspecified(787.20)   . History of seizure disorder   . Brain anoxic injury (HCC)     a. 2007 in setting of VF arrest.  . GERD (gastroesophageal reflux disease)   . Memory loss     a. since VF arrest and anoxic brain injury in 2007.    PAST SURGICAL HISTORY: Past Surgical History  Procedure Laterality Date  . Stent thrombosis  03/2006    Complicated my sudden cardiac death  . Kidney stone surgery    . Cardiac catheterization  09/2005    with bare metal stent to the mid LAD (Minivision 2.5 x 18 mm) with Provisional balloon angioplasty to the second diagonal.  . Cardiac catheterization  03/2006    with stenting of the mid LAD with Taxus 2.5 x 20 mm and 2.5 x 8 mm stents.      . Bedside tracheostomy      with #6 Shiley. 04/14/2006  . Esophagogastroduodenoscopy    . Peg tube placement    . Cardiac catheterization N/A 07/20/2015    Procedure: Left Heart Cath and Coronary Angiography;  Surgeon: Corky Crafts, MD;  Location: [facility]  INVASIVE CV LAB;  Service: Cardiovascular;  Laterality: N/A;  . Cardiac catheterization N/A 07/20/2015    Procedure: Coronary Stent Intervention;  Surgeon: Corky Crafts, MD;  Location: [facility] INVASIVE CV LAB;  Service: Cardiovascular;  Laterality: N/A;  . Tracheostomy closure    . Peg tube removal    . Coronary stent placement  07/20/2015    LAD with DES    FAMILY HISTORY: Family History  Problem Relation Age of Onset  . Heart attack Mother   . Heart attack Father   . Coronary artery disease Neg Hx     Siblings  SOCIAL HISTORY:  Social History   Social History  . Marital Status: Married    Spouse Name: N/A  . Number of Children: 4  . Years of Education: 12   Occupational History  . Unemployed    Social History Main Topics  . Smoking status: Current Every Day Smoker -- 30 years  . Smokeless tobacco: Former Neurosurgeon    Types: Chew    Quit date: 09/03/2005  . Alcohol Use: No  . Drug Use: No  . Sexual Activity: Not on file   Other Topics Concern  . Not on file   Social History Narrative   Lives at home with wife.   Right-handed.   No caffeine use.        PHYSICAL EXAM   Filed Vitals:   09/19/15 1638  BP: 105/61  Pulse: 60  Height:  (1.905 m)  Weight: 248 lb (112.492 kg)    Not recorded      Body mass index is 31 kg/(m^2).  PHYSICAL EXAMNIATION:  Gen: NAD, conversant, well nourised, obese, well groomed                     Cardiovascular: Regular rate rhythm, no peripheral edema, warm, nontender. Eyes: Conjunctivae clear without exudates or hemorrhage Neck: Supple, no carotid bruise. Pulmonary: Clear to auscultation bilaterally   NEUROLOGICAL EXAM:  MENTAL STATUS: Speech:    Speech is normal; fluent and spontaneous with normal comprehension.  Cognition: Mini-Mental Status Examination is 16/30, animal naming 15     Orientation to time, place and person: He is not oriented to date, year, month, day, name of clinic    Recent and remote  memory: He missed 3 out of 3 recall     Normal Attention span and concentration     Normal Language, naming, repeating,spontaneous speech, he could not complete a sentence     Fund of knowledge   CRANIAL NERVES: CN II: Visual fields are full to confrontation. Fundoscopic exam is normal with sharp discs and no vascular changes. Pupils are round equal and briskly reactive to light. CN III, IV, VI: extraocular movement are normal. No ptosis. CN V: Facial sensation is intact to pinprick in all 3 divisions bilaterally. Corneal responses are intact.  CN VII: Face is symmetric with normal eye closure and smile. CN VIII: Hearing is normal to rubbing fingers CN IX, X: Palate elevates symmetrically. Phonation is normal. CN XI: Head turning and shoulder shrug are intact CN XII: Tongue is midline with normal movements and no atrophy.  MOTOR: There is no pronator drift of out-stretched arms. Muscle bulk and tone are normal. Muscle strength is normal.  REFLEXES: Reflexes are 2+ and symmetric at the biceps, triceps, knees, and ankles. Plantar responses are flexor.  SENSORY: Intact to light touch, pinprick, position sense, and vibration sense are intact in fingers and toes.  COORDINATION: Rapid alternating movements and fine finger movements are intact. There is no dysmetria on finger-to-nose and heel-knee-shin.    GAIT/STANCE: Posture is normal. Gait is steady with normal steps, base, arm swing, and turning. Heel and toe walking are normal. Tandem gait is normal.  Romberg is absent.   DIAGNOSTIC DATA (LABS, IMAGING, TESTING) - I reviewed patient records, labs, notes, testing and imaging myself where available.   ASSESSMENT AND PLAN  [name] Cummings is a 55 y.o. male   History of anoxic brain injury, memory loss  Coronary artery disease New onset left chest discomfort, left-sided paresthesia  Differentiated seen  diagnosis including complex partial seizure.  EEG  Further cardiac evaluation  is pending   Levert FeinsteinYijun Mohammad Granade, M.D. Ph.D.  [facility] [address], [address] [place], [place] [address] Ph: [phone] Fax: [phone]  CC: Delorse LekBrent A Burnett, MD

## 2015-09-20 ENCOUNTER — Ambulatory Visit (INDEPENDENT_AMBULATORY_CARE_PROVIDER_SITE_OTHER): Payer: 59 | Admitting: Cardiology

## 2015-09-20 ENCOUNTER — Encounter: Payer: Self-pay | Admitting: Cardiology

## 2015-09-20 VITALS — BP 100/60 | HR 61 | Ht 75.0 in | Wt 248.0 lb

## 2015-09-20 DIAGNOSIS — E785 Hyperlipidemia, unspecified: Secondary | ICD-10-CM | POA: Diagnosis not present

## 2015-09-20 DIAGNOSIS — I2510 Atherosclerotic heart disease of native coronary artery without angina pectoris: Secondary | ICD-10-CM | POA: Diagnosis not present

## 2015-09-20 DIAGNOSIS — I10 Essential (primary) hypertension: Secondary | ICD-10-CM | POA: Diagnosis not present

## 2015-09-20 DIAGNOSIS — I25119 Atherosclerotic heart disease of native coronary artery with unspecified angina pectoris: Secondary | ICD-10-CM | POA: Diagnosis not present

## 2015-09-20 MED ORDER — PRAVASTATIN SODIUM 20 MG PO TABS: 20.0000 mg | ORAL_TABLET | Freq: Every evening | ORAL | Status: DC

## 2015-09-20 MED ORDER — ISOSORBIDE MONONITRATE ER 30 MG PO TB24: 30.0000 mg | ORAL_TABLET | Freq: Every day | ORAL | Status: DC

## 2015-09-20 NOTE — Progress Notes (Signed)
Patient ID: [name] Cummings, male   DOB: 04/10/1961, 55 y.o.   MRN: [id number]     Clinical Summary [name] Cummings is a 55 y.o.male seen today for follow up of the following medical problems.   1. CAD - hx of BMS to LAD in 2007, followed by stent thrombosis with resulting VF arrest. LAD was restented at that time. - cath 2010 LAD 50% prox, 30% ISR in mid LAD, distal LAD 70-80%, LCX 40% ostial, 70-80% narrowing proximal portion LCX in the ramus branch, RCA 70-80% proximal. LVEF 45%, the distal LAD was stented.  - cath Jan 2017 severe ISR of LAD stent, received another DES. Small OM1 subtotally occluded with left to left collaterals - echo Jan 2016 LVEF 60-65% - seen in [facility] 08[facility]2015 with atypical chest pain, no evidence of ACS. Imdur was added.   - recently tingling left arm and chest. Constant feeling, occurs at rest and with activity. Occasional chest pressure that can occur at rest or with exertion.   2. HTN - compliant with meds  3. Hyperlipidemia - leg pains on low dose atorva  4. 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 Patient is not able to give description of symptoms, symptoms are all relayed through his wife.    Past Medical History  Diagnosis Date  . Ischemic cardiomyopathy     a. 06/2014 Echo: EF 60-65%.  Marland Kitchen. CAD (coronary artery disease)     a. 2007 Ant MI/VF Arrest/PCI LAD w/ BMS;  b. 07/2015 Cath/PCI: LM nl, LAD 80 ISR (2.5x28 Synergy DES), LCX min irregs, OM1 99, RCA 50p.  . Ventricular fibrillation (HCC)     a. 2007->VF Arrest.  . Hypertensive heart disease     unspecified  . Hyperlipidemia, mixed   . Anxiety   . Depression   . Dysphagia, unspecified(787.20)   . History of seizure disorder   . Brain anoxic injury (HCC)     a. 2007 in setting of VF arrest.  . GERD (gastroesophageal reflux disease)   . Memory loss     a. since VF arrest and anoxic brain injury in 2007.     Allergies  Allergen Reactions  . Hydromorphone Hcl    REACTION: Reaction not known  . Lorazepam     REACTION: Reaction not known  . Pseudoephedrine     REACTION: Reaction not known  . Terfenadine     REACTION: Reaction not known  . Atorvastatin     Pt c/o myalgias but was told to break tab in 1/2 and take it and has tolerated medication fine since dose was cut in 1/2     Current Outpatient Prescriptions  Medication Sig Dispense Refill  . aspirin EC 81 MG tablet Take 81 mg by mouth daily.    Marland Kitchen. atorvastatin (LIPITOR) 10 MG tablet Take 10 mg by mouth daily.    . carvedilol (COREG) 25 MG tablet Take 25 mg by mouth 2 (two) times daily with a meal.      . clopidogrel (PLAVIX) 75 MG tablet Take 1 tablet (75 mg total) by mouth daily. 30 tablet 6  . FLUoxetine (PROZAC) 20 MG capsule Take 20 mg by mouth at bedtime.     . fluticasone (FLONASE) 50 MCG/ACT nasal spray Place 1 spray into both nostrils daily as needed for allergies.     . furosemide (LASIX) 20 MG tablet Take 20 mg by mouth daily.     . isosorbide mononitrate (IMDUR) 30 MG 24 hr tablet  Take 0.5 tablets (15 mg total) by mouth daily. 90 tablet 3  . loratadine (CLARITIN) 10 MG tablet Take 10 mg by mouth daily.    . nitroGLYCERIN (NITROSTAT) 0.4 MG SL tablet Place 1 tablet (0.4 mg total) under the tongue every 5 (five) minutes as needed for chest pain. 25 tablet 3  . pantoprazole (PROTONIX) 40 MG tablet Take 1 tablet (40 mg total) by mouth daily. 30 tablet 6  . ranitidine (ZANTAC) 150 MG tablet Take 150 mg by mouth 2 (two) times daily.     No current facility-administered medications for this visit.     Past Surgical History  Procedure Laterality Date  . Stent thrombosis  03/2006    Complicated my sudden cardiac death  . Kidney stone surgery    . Cardiac catheterization  09/2005    with bare metal stent to the mid LAD (Minivision 2.5 x 18 mm) with Provisional balloon angioplasty to the second diagonal.  . Cardiac catheterization  03/2006    with stenting of the mid LAD with Taxus 2.5  x 20 mm and 2.5 x 8 mm stents.      . Bedside tracheostomy      with #6 Shiley. 04/14/2006  . Esophagogastroduodenoscopy    . Peg tube placement    . Cardiac catheterization N/A 07/20/2015    Procedure: Left Heart Cath and Coronary Angiography;  Surgeon: Corky Crafts, MD;  Location: [facility] INVASIVE CV LAB;  Service: Cardiovascular;  Laterality: N/A;  . Cardiac catheterization N/A 07/20/2015    Procedure: Coronary Stent Intervention;  Surgeon: Corky Crafts, MD;  Location: [facility] INVASIVE CV LAB;  Service: Cardiovascular;  Laterality: N/A;  . Tracheostomy closure    . Peg tube removal    . Coronary stent placement  07/20/2015    LAD with DES     Allergies  Allergen Reactions  . Hydromorphone Hcl     REACTION: Reaction not known  . Lorazepam     REACTION: Reaction not known  . Pseudoephedrine     REACTION: Reaction not known  . Terfenadine     REACTION: Reaction not known  . Atorvastatin     Pt c/o myalgias but was told to break tab in 1/2 and take it and has tolerated medication fine since dose was cut in 1/2      Family History  Problem Relation Age of Onset  . Heart attack Mother   . Heart attack Father   . Coronary artery disease Neg Hx     Siblings     Social History [name] Cummings reports that he has been smoking.  He quit smokeless tobacco use about 10 years ago. His smokeless tobacco use included Chew. [name] Cummings reports that he does not drink alcohol.   Review of Systems CONSTITUTIONAL: No weight loss, fever, chills, weakness or fatigue.  HEENT: Eyes: No visual loss, blurred vision, double vision or yellow sclerae.No hearing loss, sneezing, congestion, runny nose or sore throat.  SKIN: No rash or itching.  CARDIOVASCULAR: per HPI RESPIRATORY: No shortness of breath, cough or sputum.  GASTROINTESTINAL: No anorexia, nausea, vomiting or diarrhea. No abdominal pain or blood.  GENITOURINARY: No burning on urination, no polyuria NEUROLOGICAL: No headache,  dizziness, syncope, paralysis, ataxia, numbness or tingling in the extremities. No change in bowel or bladder control.  MUSCULOSKELETAL: No muscle, back pain, joint pain or stiffness.  LYMPHATICS: No enlarged nodes. No history of splenectomy.  PSYCHIATRIC: No history of depression or anxiety.  ENDOCRINOLOGIC: No reports  of sweating, cold or heat intolerance. No polyuria or polydipsia.  Marland Kitchen   Physical Examination Filed Vitals:   09/20/15 0851  BP: 100/60  Pulse: 61   Filed Vitals:   09/20/15 0851  Height:  (1.905 m)  Weight: 248 lb (112.492 kg)    Gen: resting comfortably, no acute distress HEENT: no scleral icterus, pupils equal round and reactive, no palptable cervical adenopathy,  CV: RRR, no m/r/g no jvd Resp: Clear to auscultation bilaterally GI: abdomen is soft, non-tender, non-distended, normal bowel sounds, no hepatosplenomegaly MSK: extremities are warm, no edema.  Skin: warm, no rash Neuro:  no focal deficits Psych: appropriate affect   Diagnostic Studies  11/2008 Cath RESULTS: Left main coronary artery: The left main coronary artery was free of significant disease.  Left anterior descending: The left anterior descending artery gave rise to small diagonal branch and small septal perforator, and larger diagonal branch. There was 30% narrowing within the midportion of the previously placed stents. Distal to the stent, there was an eccentric lesion, which appeared to be a ruptured plaque, which was estimated to be 70-80% obstructive. There also was 40-50% diffuse narrowing in the proximal LAD.  Circumflex artery: The circumflex artery gave rise to a large ramus branch and two posterolateral branches and a posterior descending branch. This was a dominant vessel. There was 70-80% narrowing in the proximal portion in the ramus branch which was extended over about 18 mm. There was also a 40% ostial narrowing in the circumflex artery  and 50% narrowing in the ostium of the ramus branch.  Right coronary artery: The right coronary artery was a nondominant vessel which supplied only right ventricular branches. There was a 70- 80% proximal stenosis.  Left ventriculogram: The left ventriculogram was performed in the RAO projection showed hypokinesis of the anterolateral wall and apex. The estimated fraction was 45%.  Following stenting of the lesion in the mid LAD, the stenosis improved from 80% to 0%.  CONCLUSIONS: 1. Coronary artery disease, status post previous percutaneous coronary  interventions as described above with 40% narrowing in the proximal  LAD, 30% narrowing within the stent in the mid LAD, and 80%  narrowing in the mid LAD after the stent, 70-80% narrowing in the  large ramus branch of the circumflex artery with 40% ostial  stenosis in the dominant circumflex artery, and 80% narrowing in  the proximal portion of a nondominant right coronary artery with  anterolateral wall and apical wall hypokinesis and estimated  ejection fraction of 45%. 2. Successful PCI of the lesion in the mid LAD distal to the  previously placed stent using a XIENCE drug-eluting stent with  improvement in center narrowing from 80% to 0%.  DISPOSITION: The patient returned to [facility] room for further observation.    Jan 2016 echo Study Conclusions  - Left ventricle: The cavity size was normal. Wall thickness was normal. Systolic function was normal. The estimated ejection fraction was in the range of 60% to 65%. Indeterminant diastolic function. - Aortic valve: Mildly calcified annulus. Mildly thickened leaflets. - Mitral valve: Mildly calcified annulus. Mildly thickened leaflets . - Left atrium: The atrium was mildly dilated. - Systemic veins: IVC is small, suggestive of low RA pressure and hypovolemia. - Technically adequate  study.   07/10/15 Clinic EKG (performed and reviewed in clinic): Sinus bradycardia  Jan 2017 Lexiscan MPI  Perfusion Summary Defect 1:  There is a large defect of moderate severity present in the basal anterolateral, mid anterolateral and apical  lateral location. The defect is partially reversible. Large, moderate intensity, partially reversible anterolateral defect consistent with scar and at least moderate peri-infarct ischemia.    Overall Study Impression Myocardial perfusion is abnormal. This is a high risk study. Overall left ventricular systolic function was abnormal. Nuclear stress EF: 43%.      Jan 2017 cath  OM1 subtotally occluded with left to left collaterals coming from the apical LAD.  Severe in-stent restenosis of prior bare-metal stent which was placed in 2007 in the setting of his cardiac arrest. This was treated with cutting balloon angioplasty anddrug-eluting stent placement with a 2.5 x 28 Synergy drug-eluting stent, postdilatto greater than 3 m  Continue dual antiplatelet therapy indefinitely. The patient has significant memory loss after his cardiac arrest. Will discuss with his wife about the importance of staying on his dual antiplatelet therapy.   He'll be watched overnight with anticipated discharge tomorrow.  Assessment and Plan   1. CAD - recent chest pain and arm tingling. EKG without ischemic changes.    History is difficult to collect as patient as severe memory deficit, symptoms are described by his wife. The description is not overally consistent with cardiac chest pain however given his history his family remains very apprehensive.  Will increase imdur to 30mg  daily and follow symptoms closely.   2 HTN - at goal, continue current meds  3. Hyperlipidemia - muscle aches on atorva, will stop and try pravastatin 20mg  daily.   F/u 2-3 weeks     Antoine Poche, M.D.

## 2015-09-20 NOTE — Patient Instructions (Signed)
Medication Instructions:  STOP LIPITOR START PRAVASTATIN 20 MG DAILY INCREASE IMDUR 30 MG DAILY  Labwork: NONE  Testing/Procedures: NONE  Follow-Up: Your physician recommends that you schedule a follow-up appointment in: 2-3 WEEKS WITH DR. BRANCH   Any Other Special Instructions Will Be Listed Below (If Applicable).     If you need a refill on your cardiac medications before your next appointment, please call your pharmacy.

## 2015-09-21 ENCOUNTER — Encounter (HOSPITAL_COMMUNITY)
Admission: RE | Admit: 2015-09-21 | Discharge: 2015-09-21 | Disposition: A | Discharge status: Home / Self Care | Payer: 59 | Source: Ambulatory Visit | Attending: Cardiology | Admitting: Cardiology

## 2015-09-21 DIAGNOSIS — I214 Non-ST elevation (NSTEMI) myocardial infarction: Secondary | ICD-10-CM | POA: Diagnosis not present

## 2015-09-24 ENCOUNTER — Encounter (HOSPITAL_COMMUNITY)
Admission: RE | Admit: 2015-09-24 | Discharge: 2015-09-24 | Disposition: A | Discharge status: Home / Self Care | Payer: 59 | Source: Ambulatory Visit | Attending: Cardiology | Admitting: Cardiology

## 2015-09-24 DIAGNOSIS — I214 Non-ST elevation (NSTEMI) myocardial infarction: Secondary | ICD-10-CM | POA: Diagnosis not present

## 2015-09-24 NOTE — Progress Notes (Signed)
Cardiac Individual Treatment Plan  Patient Details  Name: [name] PumaJesse D Cummings MRN: [id number] Date of Birth: 06/20/1961 Referring Provider:  Antoine PocheBranch, Jonathan F, MD  Initial Encounter Date:       [facility]   Date  09/04/15      Visit Diagnosis: No diagnosis found.  Patient's Home Medications on Admission:  Current outpatient prescriptions:  .  aspirin EC 81 MG tablet, Take 81 mg by mouth daily., Disp: , Rfl:  .  carvedilol (COREG) 25 MG tablet, Take 25 mg by mouth 2 (two) times daily with a meal.  , Disp: , Rfl:  .  clopidogrel (PLAVIX) 75 MG tablet, Take 1 tablet (75 mg total) by mouth daily., Disp: 30 tablet, Rfl: 6 .  FLUoxetine (PROZAC) 20 MG capsule, Take 20 mg by mouth at bedtime. , Disp: , Rfl:  .  fluticasone (FLONASE) 50 MCG/ACT nasal spray, Place 1 spray into both nostrils daily as needed for allergies. , Disp: , Rfl:  .  furosemide (LASIX) 20 MG tablet, Take 20 mg by mouth daily. , Disp: , Rfl:  .  isosorbide mononitrate (IMDUR) 30 MG 24 hr tablet, Take 1 tablet (30 mg total) by mouth daily., Disp: 90 tablet, Rfl: 3 .  loratadine (CLARITIN) 10 MG tablet, Take 10 mg by mouth daily., Disp: , Rfl:  .  nitroGLYCERIN (NITROSTAT) 0.4 MG SL tablet, Place 1 tablet (0.4 mg total) under the tongue every 5 (five) minutes as needed for chest pain., Disp: 25 tablet, Rfl: 3 .  pantoprazole (PROTONIX) 40 MG tablet, Take 1 tablet (40 mg total) by mouth daily., Disp: 30 tablet, Rfl: 6 .  pravastatin (PRAVACHOL) 20 MG tablet, Take 1 tablet (20 mg total) by mouth every evening., Disp: 90 tablet, Rfl: 3 .  ranitidine (ZANTAC) 150 MG tablet, Take 150 mg by mouth 2 (two) times daily., Disp: , Rfl:   Past Medical History: Past Medical History  Diagnosis Date  . Ischemic cardiomyopathy     a. 06/2014 Echo: EF 60-65%.  Marland Kitchen. CAD (coronary artery disease)     a. 2007 Ant MI/VF Arrest/PCI LAD w/ BMS;  b. 07/2015 Cath/PCI: LM nl, LAD  80 ISR (2.5x28 Synergy DES), LCX min irregs, OM1 99, RCA 50p.  . Ventricular fibrillation (HCC)     a. 2007->VF Arrest.  . Hypertensive heart disease     unspecified  . Hyperlipidemia, mixed   . Anxiety   . Depression   . Dysphagia, unspecified(787.20)   . History of seizure disorder   . Brain anoxic injury (HCC)     a. 2007 in setting of VF arrest.  . GERD (gastroesophageal reflux disease)   . Memory loss     a. since VF arrest and anoxic brain injury in 2007.    Tobacco Use: History  Smoking status  . Never Smoker   Smokeless tobacco  . Former NeurosurgeonUser  . Types: Chew  . Quit date: 09/03/2005    Labs: Recent Review Flowsheet Data    Labs for ITP Cardiac and Pulmonary Rehab Latest Ref Rng 06/17/2007 04/21/2008 12/01/2008 12/02/2008 07/16/2009   Cholestrol 0-200 mg/dL 409124 ATP III CLASSIFICATION: <200     mg/dL   Desirable 811-914200-239  mg/dL   Borderline High >=782>=240    mg/dL   High 956139 - 213149 ATP III CLASSIFICATION: <200     mg/dL   Desirable 086-578200-239  mg/dL   Borderline High >=469>=240    mg/dL  High 148   LDLCALC 0-99 mg/dL 82 Total Cholesterol/HDL:CHD Risk Coronary Heart Disease Risk Table Men   Women 1/2 Average Risk   3.4   3.3 82 - 83 Total Cholesterol/HDL:CHD Risk Coronary Heart Disease Risk Table Men   Women 1/2 Average Risk   3.4   3.3 Average Risk       5.0   4.4 2 X Average Risk   9.6   7.1 3 X Average Risk  23.4   11.0 Use the calculated Patient Ratio above and the CHD Risk Table to determine the patient's CHD Risk. ATP III CLASSIFICATION (LDL): <100     mg/dL   Optimal 161-096  mg/dL   Near or Above Optimal 130-159  mg/dL   Borderline 045-409  mg/dL   High >811     mg/dL   Very High 91   HDL >91.47 mg/dL 82(N) 38.5(L) - 29(L) 40.70   Trlycerides 0.0-149.0 mg/dL 61 94 - 562(Z) 30.8   Hemoglobin A1c 4.6 - 6.1 % - - 5.6 (NOTE) The ADA recommends the following therapeutic goal for glycemic control related to Hgb A1c measurement: Goal of therapy: <6.5 Hgb A1c   Reference: American Diabetes Association: Clinical Practice Recommendations 2010, Diabetes Care, 2010, 33: (Suppl 1). - -   TCO2 0 - 100 mmol/L - - 25 - -      Capillary Blood Glucose: No results found for: GLUCAP   Exercise Target Goals:    Exercise Program Goal: Individual exercise prescription set with THRR, safety & activity barriers. Participant demonstrates ability to understand and report RPE using BORG scale, to self-measure pulse accurately, and to acknowledge the importance of the exercise prescription.  Exercise Prescription Goal: Starting with aerobic activity 30 plus minutes a day, 3 days per week for initial exercise prescription. Provide home exercise prescription and guidelines that participant acknowledges understanding prior to discharge.  Activity Barriers & Risk Stratification:     Activity Barriers & Cardiac Risk Stratification - 09/04/15 1525    Activity Barriers & Cardiac Risk Stratification   Activity Barriers None   Cardiac Risk Stratification High      6 Minute Walk:     6 Minute Walk      09/04/15 1544       6 Minute Walk   Phase Initial     Distance 950 feet     Walk Time 6 minutes     # of Rest Breaks 0     MPH 1.8     METS 2.38     RPE 11     Perceived Dyspnea  11     VO2 Peak 10.28     Symptoms No     Resting HR 67 bpm     Resting BP 114/72 mmHg     Max Ex. HR 85 bpm     Max Ex. BP 140/80 mmHg     2 Minute Post BP 124/86 mmHg     Pre/Post BP   Baseline BP 114/72 mmHg     6 Minute BP 140/80 mmHg     Pre/Post BP? Yes        Initial Exercise Prescription:     Initial Exercise Prescription - 09/04/15 1600    Date of Initial Exercise Prescription   Date 09/04/15   Treadmill   MPH 1.5   Grade 0   Minutes 15   METs 2.15   NuStep   Level 2   Minutes 15   METs 1.5   Arm Ergometer  Level 1.5   Minutes 15   METs 1.5   Prescription Details   Frequency (times per week) 3   Intensity   THRR REST +  30   THRR 40-80% of  Max Heartrate 107-146   Ratings of Perceived Exertion 11-13   Progression   Progression Continue to progress workloads to maintain intensity without signs/symptoms of physical distress.   Resistance Training   Training Prescription Yes   Weight 1.0   Reps 10-12      Perform Capillary Blood Glucose checks as needed.  Exercise Prescription Changes:     Exercise Prescription Changes      09/24/15 1300           Exercise Review   Progression Yes       Response to Exercise   Blood Pressure (Admit) 110/60 mmHg       Blood Pressure (Exercise) 122/70 mmHg       Blood Pressure (Exit) 100/66 mmHg       Heart Rate (Admit) 64 bpm       Heart Rate (Exercise) 92 bpm       Heart Rate (Exit) 68 bpm       Rating of Perceived Exertion (Exercise) 10       Symptoms No       Progression   Progression Continue to progress workloads to maintain intensity without signs/symptoms of physical distress.       Resistance Training   Training Prescription Yes       Weight 2.0       Reps 10-12       Interval Training   Interval Training No       NuStep   Level 2       Minutes 15       METs 1.8       Arm Ergometer   Level 1.5       Minutes 15       METs 2          Exercise Comments:    Discharge Exercise Prescription (Final Exercise Prescription Changes):     Exercise Prescription Changes - 09/24/15 1300    Exercise Review   Progression Yes   Response to Exercise   Blood Pressure (Admit) 110/60 mmHg   Blood Pressure (Exercise) 122/70 mmHg   Blood Pressure (Exit) 100/66 mmHg   Heart Rate (Admit) 64 bpm   Heart Rate (Exercise) 92 bpm   Heart Rate (Exit) 68 bpm   Rating of Perceived Exertion (Exercise) 10   Symptoms No   Progression   Progression Continue to progress workloads to maintain intensity without signs/symptoms of physical distress.   Resistance Training   Training Prescription Yes   Weight 2.0   Reps 10-12   Interval Training   Interval Training No   NuStep    Level 2   Minutes 15   METs 1.8   Arm Ergometer   Level 1.5   Minutes 15   METs 2      Nutrition:  Target Goals: Understanding of nutrition guidelines, daily intake of sodium 1500mg , cholesterol 200mg , calories 30% from fat and 7% or less from saturated fats, daily to have 5 or more servings of fruits and vegetables.  Biometrics:     Pre Biometrics - 09/04/15 1607    Pre Biometrics   Height  (1.905 m)   Weight 248 lb 6.4 oz (112.674 kg)   Waist Circumference 44 inches   Hip Circumference 44 inches  Waist to Hip Ratio 1 %   BMI (Calculated) 31.1   Triceps Skinfold 7 mm   % Body Fat 26.4 %   Grip Strength 102 kg   Flexibility 0 in   Single Leg Stand 30 seconds       Nutrition Therapy Plan and Nutrition Goals:     Nutrition Therapy & Goals - 09/04/15 1527    Intervention Plan   Intervention Nutrition handout(s) given to patient.   Expected Outcomes Short Term Goal: Understand basic principles of dietary content, such as calories, fat, sodium, cholesterol and nutrients.      Nutrition Discharge: Rate Your Plate Scores:     Nutrition Assessments - 09/04/15 1528    MEDFICTS Scores   Pre Score 9      Nutrition Goals Re-Evaluation:     Nutrition Goals Re-Evaluation      09/24/15 1455           Weight   Current Weight 252 lb 9 oz (114.562 kg)       Intervention Plan   Intervention Continue to educate, counsel and set short/long term goals regarding individualized specific personal dietary modifications.          Psychosocial: Target Goals: Acknowledge presence or absence of depression, maximize coping skills, provide positive support system. Participant is able to verbalize types and ability to use techniques and skills needed for reducing stress and depression.  Initial Review & Psychosocial Screening:     Initial Psych Review & Screening - 09/04/15 1530    Initial Review   Current issues with --  none   Family Dynamics   Good Support  System? Yes   Concerns --  none   Barriers   Psychosocial barriers to participate in program There are no identifiable barriers or psychosocial needs.   Screening Interventions   Interventions Encouraged to exercise      Quality of Life Scores:     Quality of Life - 09/04/15 1619    Quality of Life Scores   Health/Function Pre 1932 %   Socioeconomic Pre 26.3 %   Psych/Spiritual Pre 27.3 %   Family Pre 24.3 %   GLOBAL Pre 22.76 %      PHQ-9:     Recent Review Flowsheet Data    Depression screen [facility] 2/9 09/04/2015   Decreased Interest 0   Down, Depressed, Hopeless 0   PHQ - 2 Score 0   Altered sleeping 0   Tired, decreased energy 0   Change in appetite 0   Feeling bad or failure about yourself  0   Trouble concentrating 0   Moving slowly or fidgety/restless 0   Suicidal thoughts 0   PHQ-9 Score 0   Difficult doing work/chores Not difficult at all      Psychosocial Evaluation and Intervention:     Psychosocial Evaluation - 09/04/15 1530    Psychosocial Evaluation & Interventions   Interventions Encouraged to exercise with the program and follow exercise prescription   Continued Psychosocial Services Needed No      Psychosocial Re-Evaluation:     Psychosocial Re-Evaluation      09/24/15 1458           Psychosocial Re-Evaluation   Interventions Encouraged to attend Cardiac Rehabilitation for the exercise       Continued Psychosocial Services Needed No          Vocational Rehabilitation: Provide vocational rehab assistance to qualifying candidates.   Vocational Rehab Evaluation & Intervention:  Vocational Rehab - 09/04/15 1526    Initial Vocational Rehab Evaluation & Intervention   Assessment shows need for Vocational Rehabilitation No      Education: Education Goals: Education classes will be provided on a weekly basis, covering required topics. Participant will state understanding/return demonstration of topics presented.  Learning  Barriers/Preferences:     Learning Barriers/Preferences - 09/04/15 1525    Learning Barriers/Preferences   Learning Barriers Inability to learn new things  patient has history of anoxic brain damage.    Learning Preferences Verbal Instruction      Education Topics: Hypertension, Hypertension Reduction -Define heart disease and high blood pressure. Discus how high blood pressure affects the body and ways to reduce high blood pressure.   Exercise and Your Heart -Discuss why it is important to exercise, the FITT principles of exercise, normal and abnormal responses to exercise, and how to exercise safely.   Angina -Discuss definition of angina, causes of angina, treatment of angina, and how to decrease risk of having angina.   Cardiac Medications -Review what the following cardiac medications are used for, how they affect the body, and side effects that may occur when taking the medications.  Medications include Aspirin, Beta blockers, calcium channel blockers, ACE Inhibitors, angiotensin receptor blockers, diuretics, digoxin, and antihyperlipidemics.   Congestive Heart Failure -Discuss the definition of CHF, how to live with CHF, the signs and symptoms of CHF, and how keep track of weight and sodium intake.   Heart Disease and Intimacy -Discus the effect sexual activity has on the heart, how changes occur during intimacy as we age, and safety during sexual activity.   Smoking Cessation / COPD -Discuss different methods to quit smoking, the health benefits of quitting smoking, and the definition of COPD.          CARDIAC REHAB PHASE II EXERCISE from 09/12/2015 in [facility]   Date  09/12/15   Educator  Hart Rochester   Instruction Review Code  2- meets goals/outcomes      Nutrition I: Fats -Discuss the types of cholesterol, what cholesterol does to the heart, and how cholesterol levels can be controlled.   Nutrition II: Labels -Discuss the different  components of food labels and how to read food label   Heart Parts and Heart Disease -Discuss the anatomy of the heart, the pathway of blood circulation through the heart, and these are affected by heart disease.   Stress I: Signs and Symptoms -Discuss the causes of stress, how stress may lead to anxiety and depression, and ways to limit stress.   Stress II: Relaxation -Discuss different types of relaxation techniques to limit stress.   Warning Signs of Stroke / TIA -Discuss definition of a stroke, what the signs and symptoms are of a stroke, and how to identify when someone is having stroke.   Knowledge Questionnaire Score:     Knowledge Questionnaire Score - 09/04/15 1526    Knowledge Questionnaire Score   Pre Score 21/24      Core Components/Risk Factors/Patient Goals at Admission:     Personal Goals and Risk Factors at Admission - 09/04/15 1528    Core Components/Risk Factors/Patient Goals on Admission    Weight Management Yes   Intervention Weight Management: Provide education and appropriate resources to help participant work on and attain dietary goals.   Expected Outcomes Short Term: Continue to assess and modify interventions until short term weight is achieved.;Long Term: Adherence to nutrition and physical activity/exercise program aimed toward attainment  of established weight goal.   Hypertension Yes   Intervention Monitor prescription use compliance.;Provide education on lifestyle modifcations including regular physical activity/exercise, weight management, moderate sodium restriction and increased consumption of fresh fruit, vegetables, and low fat dairy, alcohol moderation, and smoking cessation.   Expected Outcomes Short Term: Continued assessment and intervention until BP is < 140/74mm HG in hypertensive participants. < 130/90mm HG in hypertensive participants with diabetes, heart failure or chronic kidney disease.   Lipids Yes   Intervention Provide education  and support for participant on nutrition & aerobic/resistive exercise along with prescribed medications to achieve LDL 70mg , HDL >40mg .   Expected Outcomes Short Term: Participant states understanding of desired cholesterol values and is compliant with medications prescribed. Participant is following exercise prescription and nutrition guidelines.      Core Components/Risk Factors/Patient Goals Review:      Goals and Risk Factor Review      09/24/15 1457           Core Components/Risk Factors/Patient Goals Review   Personal Goals Review Weight Management/Obesity;Hypertension;Lipids       Review Will continue to education patient/wife on these topics.  Patient has gained 4 lbs since first session.           Core Components/Risk Factors/Patient Goals at Discharge (Final Review):      Goals and Risk Factor Review - 09/24/15 1457    Core Components/Risk Factors/Patient Goals Review   Personal Goals Review Weight Management/Obesity;Hypertension;Lipids   Review Will continue to education patient/wife on these topics.  Patient has gained 4 lbs since first session.       ITP Comments:     ITP Comments      09/04/15 1526           ITP Comments Patient is a 55 year old male that lives with his wife.  patient enjoys eating and playing ball with kids. patient humorous and high spirited. Has history of anoxic brain damage.  wife states he forgets things in 5 minutes.            Comments:Patients goals and exercise prescription reviewed.

## 2015-09-26 ENCOUNTER — Encounter (HOSPITAL_COMMUNITY)
Admission: RE | Admit: 2015-09-26 | Discharge: 2015-09-26 | Disposition: A | Discharge status: Home / Self Care | Payer: 59 | Source: Ambulatory Visit | Attending: Cardiology | Admitting: Cardiology

## 2015-09-26 DIAGNOSIS — I214 Non-ST elevation (NSTEMI) myocardial infarction: Secondary | ICD-10-CM | POA: Diagnosis not present

## 2015-09-28 ENCOUNTER — Encounter (HOSPITAL_COMMUNITY)
Admission: RE | Admit: 2015-09-28 | Discharge: 2015-09-28 | Disposition: A | Discharge status: Home / Self Care | Payer: 59 | Source: Ambulatory Visit | Attending: Cardiology | Admitting: Cardiology

## 2015-09-28 DIAGNOSIS — I214 Non-ST elevation (NSTEMI) myocardial infarction: Secondary | ICD-10-CM | POA: Diagnosis not present

## 2015-10-01 ENCOUNTER — Encounter (HOSPITAL_COMMUNITY)
Admission: RE | Admit: 2015-10-01 | Discharge: 2015-10-01 | Disposition: A | Discharge status: Home / Self Care | Payer: 59 | Source: Ambulatory Visit | Attending: Cardiology | Admitting: Cardiology

## 2015-10-01 DIAGNOSIS — I214 Non-ST elevation (NSTEMI) myocardial infarction: Secondary | ICD-10-CM | POA: Insufficient documentation

## 2015-10-03 ENCOUNTER — Encounter (HOSPITAL_COMMUNITY)
Admission: RE | Admit: 2015-10-03 | Discharge: 2015-10-03 | Disposition: A | Discharge status: Home / Self Care | Payer: 59 | Source: Ambulatory Visit | Attending: Cardiology | Admitting: Cardiology

## 2015-10-03 DIAGNOSIS — I214 Non-ST elevation (NSTEMI) myocardial infarction: Secondary | ICD-10-CM | POA: Diagnosis not present

## 2015-10-05 ENCOUNTER — Encounter (HOSPITAL_COMMUNITY)
Admission: RE | Admit: 2015-10-05 | Discharge: 2015-10-05 | Disposition: A | Discharge status: Home / Self Care | Payer: 59 | Source: Ambulatory Visit | Attending: Cardiology | Admitting: Cardiology

## 2015-10-05 DIAGNOSIS — I214 Non-ST elevation (NSTEMI) myocardial infarction: Secondary | ICD-10-CM | POA: Diagnosis not present

## 2015-10-08 ENCOUNTER — Encounter (HOSPITAL_COMMUNITY)
Admission: RE | Admit: 2015-10-08 | Discharge: 2015-10-08 | Disposition: A | Discharge status: Home / Self Care | Payer: 59 | Source: Ambulatory Visit | Attending: Cardiology | Admitting: Cardiology

## 2015-10-08 DIAGNOSIS — I214 Non-ST elevation (NSTEMI) myocardial infarction: Secondary | ICD-10-CM | POA: Diagnosis not present

## 2015-10-10 ENCOUNTER — Encounter (HOSPITAL_COMMUNITY)
Admission: RE | Admit: 2015-10-10 | Discharge: 2015-10-10 | Disposition: A | Discharge status: Home / Self Care | Payer: 59 | Source: Ambulatory Visit | Attending: Cardiology | Admitting: Cardiology

## 2015-10-10 DIAGNOSIS — I214 Non-ST elevation (NSTEMI) myocardial infarction: Secondary | ICD-10-CM | POA: Diagnosis not present

## 2015-10-11 ENCOUNTER — Ambulatory Visit (INDEPENDENT_AMBULATORY_CARE_PROVIDER_SITE_OTHER): Payer: 59 | Admitting: Neurology

## 2015-10-11 DIAGNOSIS — R4182 Altered mental status, unspecified: Secondary | ICD-10-CM | POA: Diagnosis not present

## 2015-10-11 DIAGNOSIS — G931 Anoxic brain damage, not elsewhere classified: Secondary | ICD-10-CM

## 2015-10-11 NOTE — Procedures (Signed)
     History: [name] FairlyJesse Cummings is a 55 year old gentleman with a history of a heart attack in October 2007 with a prolonged and anoxic brain injury with subsequent memory loss since that time. The patient has had onset of left-sided discomfort and altered sensation. He is being evaluated for this issue.  This is a routine EEG. No skull defects are noted. Medications include aspirin, Coreg, Plavix, Prozac, Flonase, Lasix, Imdur, Claritin, Protonix, Pravachol, Zantac, and nitroglycerin.  EEG classification: Dysrhythmia grade 1 left hemisphere  Description of the recording: The background rhythms of this recording consists of a relatively well-modulated medium amplitude alpha rhythm of 9 Hz that is reactive to eye opening closure. As the record progresses, the patient undergoes photic stimulation which results in a minimal but bilateral photic drive response. Hyperventilation is then performed, resulting in a slight buildup of the background rhythm activities without significant slowing seen. Intermittently during the recording, a 7 Hz theta frequency slowing is seen emanating from the left hemisphere, unassociated with sharp activity. At no time does there appear to be evidence of actual spike or spike-wave activity. EKG monitor shows no evidence of cardiac rhythm abnormalities with a heart rate of 60.  Impression: This is an abnormal EEG recording secondary to intermittent theta slowing emanating from the left hemisphere. This study suggests a left brain abnormality, but no clear epileptiform discharges were seen. Clinical correlation is required.

## 2015-10-12 ENCOUNTER — Encounter (HOSPITAL_COMMUNITY): Payer: 59

## 2015-10-12 ENCOUNTER — Encounter (HOSPITAL_COMMUNITY): Payer: Self-pay | Admitting: Emergency Medicine

## 2015-10-12 ENCOUNTER — Emergency Department (HOSPITAL_COMMUNITY)
Admission: EM | Admit: 2015-10-12 | Discharge: 2015-10-12 | Disposition: A | Discharge status: Home / Self Care | Payer: 59 | Attending: Emergency Medicine | Admitting: Emergency Medicine

## 2015-10-12 ENCOUNTER — Other Ambulatory Visit: Payer: Self-pay

## 2015-10-12 DIAGNOSIS — E785 Hyperlipidemia, unspecified: Secondary | ICD-10-CM | POA: Diagnosis not present

## 2015-10-12 DIAGNOSIS — Z87891 Personal history of nicotine dependence: Secondary | ICD-10-CM | POA: Diagnosis not present

## 2015-10-12 DIAGNOSIS — F329 Major depressive disorder, single episode, unspecified: Secondary | ICD-10-CM | POA: Insufficient documentation

## 2015-10-12 DIAGNOSIS — I4901 Ventricular fibrillation: Secondary | ICD-10-CM | POA: Diagnosis not present

## 2015-10-12 DIAGNOSIS — I119 Hypertensive heart disease without heart failure: Secondary | ICD-10-CM | POA: Insufficient documentation

## 2015-10-12 DIAGNOSIS — I2510 Atherosclerotic heart disease of native coronary artery without angina pectoris: Secondary | ICD-10-CM | POA: Diagnosis not present

## 2015-10-12 DIAGNOSIS — B029 Zoster without complications: Secondary | ICD-10-CM | POA: Diagnosis not present

## 2015-10-12 DIAGNOSIS — R569 Unspecified convulsions: Secondary | ICD-10-CM | POA: Insufficient documentation

## 2015-10-12 DIAGNOSIS — R21 Rash and other nonspecific skin eruption: Secondary | ICD-10-CM | POA: Diagnosis present

## 2015-10-12 MED ORDER — FAMCICLOVIR 500 MG PO TABS: 500.0000 mg | ORAL_TABLET | Freq: Three times a day (TID) | ORAL | Status: DC

## 2015-10-12 MED ORDER — DEXAMETHASONE 4 MG PO TABS: 4.0000 mg | ORAL_TABLET | Freq: Two times a day (BID) | ORAL | Status: DC

## 2015-10-12 MED ORDER — HYDROCODONE-ACETAMINOPHEN 5-325 MG PO TABS: 1.0000 | ORAL_TABLET | ORAL | Status: DC | PRN

## 2015-10-12 NOTE — ED Notes (Signed)
Rash to right upper abd onset yesterday. Painful at times.

## 2015-10-12 NOTE — ED Provider Notes (Signed)
CSN: [id number]     Arrival date & time 10/12/15  0809 History   First MD Initiated Contact with Patient 10/12/15 [phone]     Chief Complaint  Patient presents with  . Rash     (Consider location/radiation/quality/duration/timing/severity/associated sxs/prior Treatment) HPI Comments: Patient is a 55 year old male who presents to the emergency department with his significant other because of a rash. The patient was noticing pain under the right breast on yesterday. The family noticed a few "bumps". Today they noted some blisters on area of redness, pain of the area under the breasts and on the portion of the breast itself. The patient is in cardiac rehabilitation, he showed the rash to the staff there, and they told him to come to the emergency department for possible shingles evaluation.  The patient time had chickenpox as a child. He has a history of ischemic cardiomyopathy, coronary artery disease requiring stent placements. He has anxiety, short term memory loss and a history of seizure disorder.  The patient has not used anything other than ibuprofen for the soreness or the rash at this time. Nothing seems to make it better, palpation makes it worse.  Patient is a 55 y.o. male presenting with rash. The history is provided by the patient and the spouse.  Rash Location:  Torso Torso rash location:  R chest   Past Medical History  Diagnosis Date  . Ischemic cardiomyopathy     a. 06/2014 Echo: EF 60-65%.  Marland Kitchen CAD (coronary artery disease)     a. 2007 Ant MI/VF Arrest/PCI LAD w/ BMS;  b. 07/2015 Cath/PCI: LM nl, LAD 80 ISR (2.5x28 Synergy DES), LCX min irregs, OM1 99, RCA 50p.  . Ventricular fibrillation (HCC)     a. 2007->VF Arrest.  . Hypertensive heart disease     unspecified  . Hyperlipidemia, mixed   . Anxiety   . Depression   . Dysphagia, unspecified(787.20)   . History of seizure disorder   . Brain anoxic injury (HCC)     a. 2007 in setting of VF arrest.  . GERD  (gastroesophageal reflux disease)   . Memory loss     a. since VF arrest and anoxic brain injury in 2007.   Past Surgical History  Procedure Laterality Date  . Stent thrombosis  03/2006    Complicated my sudden cardiac death  . Kidney stone surgery    . Cardiac catheterization  09/2005    with bare metal stent to the mid LAD (Minivision 2.5 x 18 mm) with Provisional balloon angioplasty to the second diagonal.  . Cardiac catheterization  03/2006    with stenting of the mid LAD with Taxus 2.5 x 20 mm and 2.5 x 8 mm stents.      . Bedside tracheostomy      with #6 Shiley. 04/14/2006  . Esophagogastroduodenoscopy    . Peg tube placement    . Cardiac catheterization N/A 07/20/2015    Procedure: Left Heart Cath and Coronary Angiography;  Surgeon: Corky Crafts, MD;  Location: [facility] INVASIVE CV LAB;  Service: Cardiovascular;  Laterality: N/A;  . Cardiac catheterization N/A 07/20/2015    Procedure: Coronary Stent Intervention;  Surgeon: Corky Crafts, MD;  Location: [facility] INVASIVE CV LAB;  Service: Cardiovascular;  Laterality: N/A;  . Tracheostomy closure    . Peg tube removal    . Coronary stent placement  07/20/2015    LAD with DES   Family History  Problem Relation Age of Onset  . Heart attack Mother   .  Heart attack Father   . Coronary artery disease Neg Hx     Siblings   Social History  Substance Use Topics  . Smoking status: Never Smoker   . Smokeless tobacco: Former NeurosurgeonUser    Types: Chew    Quit date: 09/03/2005  . Alcohol Use: No    Review of Systems  Skin: Positive for rash.  Neurological: Positive for seizures.  Psychiatric/Behavioral: The patient is nervous/anxious.   All other systems reviewed and are negative.     Allergies  Hydromorphone hcl; Lorazepam; Pseudoephedrine; Terfenadine; and Atorvastatin  Home Medications   Prior to Admission medications   Medication Sig Start Date End Date Taking? Authorizing Provider  aspirin EC 81 MG tablet Take 81 mg by  mouth daily.    Historical Provider, MD  carvedilol (COREG) 25 MG tablet Take 25 mg by mouth 2 (two) times daily with a meal.      Historical Provider, MD  clopidogrel (PLAVIX) 75 MG tablet Take 1 tablet (75 mg total) by mouth daily. 07/21/15   Ok Anishristopher R Berge, NP  FLUoxetine (PROZAC) 20 MG capsule Take 20 mg by mouth at bedtime.  04/20/14   Historical Provider, MD  fluticasone (FLONASE) 50 MCG/ACT nasal spray Place 1 spray into both nostrils daily as needed for allergies.  04/20/14   Historical Provider, MD  furosemide (LASIX) 20 MG tablet Take 20 mg by mouth daily.  04/20/14   Historical Provider, MD  isosorbide mononitrate (IMDUR) 30 MG 24 hr tablet Take 1 tablet (30 mg total) by mouth daily. 09/20/15   Antoine PocheJonathan F Branch, MD  loratadine (CLARITIN) 10 MG tablet Take 10 mg by mouth daily.    Historical Provider, MD  nitroGLYCERIN (NITROSTAT) 0.4 MG SL tablet Place 1 tablet (0.4 mg total) under the tongue every 5 (five) minutes as needed for chest pain. 07/21/15   Ok Anishristopher R Berge, NP  pantoprazole (PROTONIX) 40 MG tablet Take 1 tablet (40 mg total) by mouth daily. 07/21/15   Ok Anishristopher R Berge, NP  pravastatin (PRAVACHOL) 20 MG tablet Take 1 tablet (20 mg total) by mouth every evening. 09/20/15   Antoine PocheJonathan F Branch, MD  ranitidine (ZANTAC) 150 MG tablet Take 150 mg by mouth 2 (two) times daily.    Historical Provider, MD   BP 123/91 mmHg  Pulse 72  Temp(Src) 98.1 F (36.7 C) (Oral)  Resp 14  Ht 6\' 2"  (1.88 m)  Wt 112.038 kg  BMI 31.70 kg/m2  SpO2 98% Physical Exam  Constitutional: He is oriented to person, place, and time. He appears well-developed and well-nourished.  Non-toxic appearance.  HENT:  Head: Normocephalic.  Right Ear: Tympanic membrane and external ear normal.  Left Ear: Tympanic membrane and external ear normal.  Eyes: EOM and lids are normal. Pupils are equal, round, and reactive to light.  No rash around the eyes, or evidence of shingle involvement.  Neck: Normal  range of motion. Neck supple. Carotid bruit is not present.  Cardiovascular: Normal rate, regular rhythm, normal heart sounds, intact distal pulses and normal pulses.   No extrasystoles are present. Exam reveals no gallop and no friction rub.   Pulmonary/Chest: Breath sounds normal. No respiratory distress.  Abdominal: Soft. Bowel sounds are normal. There is no tenderness. There is no guarding.  Musculoskeletal: Normal range of motion.  Lymphadenopathy:       Head (right side): No submandibular adenopathy present.       Head (left side): No submandibular adenopathy present.    He has no  cervical adenopathy.  Neurological: He is alert and oriented to person, place, and time. He has normal strength. No cranial nerve deficit or sensory deficit.  Skin: Skin is warm and dry. Rash noted. There is erythema.     Psychiatric: He has a normal mood and affect. His speech is normal.  Short term memory problem. Repeats words and phrases frequently.  Nursing note and vitals reviewed.   ED Course Pt seen with me by Dr Manus Gunning.  Procedures (including critical care time) Labs Review Labs Reviewed - No data to display  Imaging Review No results found. I have personally reviewed and evaluated these images and lab results as part of my medical decision-making.   EKG Interpretation None      MDM  Vital signs reviewed. Patient has an extensive cardiac history. Electrocardiogram a recheck. There no acute events noted. Patient is in a sinus rhythm at 61 bpm.  The examination is consistent with herpes zoster. Patient will be placed on Famvir, Decadron 4 inflammation, and Norco for pain. The patient is to follow with his primary physician next week for recheck. The patient is invited to return to the emergency department if any changes, problems, or concerns.    Final diagnoses:  Herpes zoster    **I have reviewed nursing notes, vital signs, and all appropriate lab and imaging results for this  patient.*  Of of  Ivery Quale, [place] 10/12/15 0900  Glynn Octave, MD 10/12/15 [phone]

## 2015-10-12 NOTE — Discharge Instructions (Signed)
Your examination is consistent with shingles (herpes zoster). Please keep your distance from pregnant women, infants, or anyone who might have a weak immune system. Please use Famvir 3 times daily and Decadron 2 times daily. Use Tylenol or ibuprofen for mild discomfort. Use Norco for more severe discomfort. Norco may cause drowsiness, please use this medication with caution. Shingles Shingles, which is also known as herpes zoster, is an infection that causes a painful skin rash and fluid-filled blisters. Shingles is not related to genital herpes, which is a sexually transmitted infection.   Shingles only develops in people who:  Have had chickenpox.  Have received the chickenpox vaccine. (This is rare.) CAUSES Shingles is caused by varicella-zoster virus (VZV). This is the same virus that causes chickenpox. After exposure to VZV, the virus stays in the body in an inactive (dormant) state. Shingles develops if the virus reactivates. This can happen many years after the initial exposure to VZV. It is not known what causes this virus to reactivate. RISK FACTORS People who have had chickenpox or received the chickenpox vaccine are at risk for shingles. Infection is more common in people who:  Are older than age 55.  Have a weakened defense (immune) system, such as those with HIV, AIDS, or cancer.  Are taking medicines that weaken the immune system, such as transplant medicines.  Are under great stress. SYMPTOMS Early symptoms of this condition include itching, tingling, and pain in an area on your skin. Pain may be described as burning, stabbing, or throbbing. A few days or weeks after symptoms start, a painful red rash appears, usually on one side of the body in a bandlike or beltlike pattern. The rash eventually turns into fluid-filled blisters that break open, scab over, and dry up in about 2-3 weeks. At any time during the infection, you may also develop:  A fever.  Chills.  A  headache.  An upset stomach. DIAGNOSIS This condition is diagnosed with a skin exam. Sometimes, skin or fluid samples are taken from the blisters before a diagnosis is made. These samples are examined under a microscope or sent to a lab for testing. TREATMENT There is no specific cure for this condition. Your health care provider will probably prescribe medicines to help you manage pain, recover more quickly, and avoid long-term problems. Medicines may include:  Antiviral drugs.  Anti-inflammatory drugs.  Pain medicines. If the area involved is on your face, you may be referred to a specialist, such as an eye doctor (ophthalmologist) or an ear, nose, and throat (ENT) doctor to help you avoid eye problems, chronic pain, or disability. HOME CARE INSTRUCTIONS Medicines  Take medicines only as directed by your health care provider.  Apply an anti-itch or numbing cream to the affected area as directed by your health care provider. Blister and Rash Care  Take a cool bath or apply cool compresses to the area of the rash or blisters as directed by your health care provider. This may help with pain and itching.  Keep your rash covered with a loose bandage (dressing). Wear loose-fitting clothing to help ease the pain of material rubbing against the rash.  Keep your rash and blisters clean with mild soap and cool water or as directed by your health care provider.  Check your rash every day for signs of infection. These include redness, swelling, and pain that lasts or increases.  Do not pick your blisters.  Do not scratch your rash. General Instructions  Rest as directed by your  health care provider.  Keep all follow-up visits as directed by your health care provider. This is important.  Until your blisters scab over, your infection can cause chickenpox in people who have never had it or been vaccinated against it. To prevent this from happening, avoid contact with other people,  especially:  Babies.  Pregnant women.  Children who have eczema.  Elderly people who have transplants.  People who have chronic illnesses, such as leukemia or AIDS. SEEK MEDICAL CARE IF:  Your pain is not relieved with prescribed medicines.  Your pain does not get better after the rash heals.  Your rash looks infected. Signs of infection include redness, swelling, and pain that lasts or increases. SEEK IMMEDIATE MEDICAL CARE IF:  The rash is on your face or nose.  You have facial pain, pain around your eye area, or loss of feeling on one side of your face.  You have ear pain or you have ringing in your ear.  You have loss of taste.  Your condition gets worse.   This information is not intended to replace advice given to you by your health care provider. Make sure you discuss any questions you have with your health care provider.   Document Released: 06/16/2005 Document Revised: 07/07/2014 Document Reviewed: 04/27/2014 Elsevier Interactive Patient Education [employer]

## 2015-10-15 ENCOUNTER — Encounter (HOSPITAL_COMMUNITY): Payer: 59

## 2015-10-15 ENCOUNTER — Telehealth: Payer: Self-pay | Admitting: Neurology

## 2015-10-15 NOTE — Telephone Encounter (Signed)
Please call patient, EEG showed mild abnormality involving left hemisphere, but there is no evidence of epileptiform discharge, the above abnormality would not explain his complains of left-sided discomfort, please advise patient to continue his cardiac workup  Impression: This is an abnormal EEG recording secondary to  intermittent theta slowing emanating from the left hemisphere.  This study suggests a left brain abnormality, but no clear  epileptiform discharges were seen. Clinical correlation is  required.

## 2015-10-16 NOTE — Telephone Encounter (Signed)
Left message for a return call

## 2015-10-17 ENCOUNTER — Encounter (HOSPITAL_COMMUNITY): Payer: 59

## 2015-10-17 NOTE — Telephone Encounter (Signed)
Spoke to his wife, [name] - she is aware of results.  States he was diagnosed with shingles this week.  They are still agreeable to the cardiac workup.

## 2015-10-19 ENCOUNTER — Encounter (HOSPITAL_COMMUNITY): Payer: 59

## 2015-10-22 ENCOUNTER — Encounter (HOSPITAL_COMMUNITY): Payer: 59

## 2015-10-24 ENCOUNTER — Encounter (HOSPITAL_COMMUNITY): Payer: 59

## 2015-10-26 ENCOUNTER — Encounter (HOSPITAL_COMMUNITY)
Admission: RE | Admit: 2015-10-26 | Discharge: 2015-10-26 | Disposition: A | Discharge status: Home / Self Care | Payer: 59 | Source: Ambulatory Visit | Attending: Cardiology | Admitting: Cardiology

## 2015-10-27 NOTE — Progress Notes (Signed)
Cardiac Individual Treatment Plan  Patient Details  Name: [name] Cummings MRN: [id number] Date of Birth: December 01, 1960 Referring Provider:    Initial Encounter Date:       CARDIAC REHAB PHASE II ORIENTATION from 09/04/2015 in [facility]   Date  09/04/15      Visit Diagnosis: No diagnosis found.  Patient's Home Medications on Admission:  Current outpatient prescriptions:  .  aspirin EC 81 MG tablet, Take 81 mg by mouth daily., Disp: , Rfl:  .  carvedilol (COREG) 25 MG tablet, Take 25 mg by mouth 2 (two) times daily with a meal.  , Disp: , Rfl:  .  clopidogrel (PLAVIX) 75 MG tablet, Take 1 tablet (75 mg total) by mouth daily., Disp: 30 tablet, Rfl: 6 .  dexamethasone (DECADRON) 4 MG tablet, Take 1 tablet (4 mg total) by mouth 2 (two) times daily with a meal., Disp: 12 tablet, Rfl: 0 .  famciclovir (FAMVIR) 500 MG tablet, Take 1 tablet (500 mg total) by mouth 3 (three) times daily., Disp: 21 tablet, Rfl: 0 .  FLUoxetine (PROZAC) 20 MG capsule, Take 20 mg by mouth at bedtime. , Disp: , Rfl:  .  fluticasone (FLONASE) 50 MCG/ACT nasal spray, Place 1 spray into both nostrils daily as needed for allergies. , Disp: , Rfl:  .  furosemide (LASIX) 20 MG tablet, Take 20 mg by mouth daily. , Disp: , Rfl:  .  HYDROcodone-acetaminophen (NORCO) 5-325 MG tablet, Take 1-2 tablets by mouth every 4 (four) hours as needed for moderate pain., Disp: 20 tablet, Rfl: 0 .  isosorbide mononitrate (IMDUR) 30 MG 24 hr tablet, Take 1 tablet (30 mg total) by mouth daily., Disp: 90 tablet, Rfl: 3 .  loratadine (CLARITIN) 10 MG tablet, Take 10 mg by mouth daily., Disp: , Rfl:  .  nitroGLYCERIN (NITROSTAT) 0.4 MG SL tablet, Place 1 tablet (0.4 mg total) under the tongue every 5 (five) minutes as needed for chest pain., Disp: 25 tablet, Rfl: 3 .  pantoprazole (PROTONIX) 40 MG tablet, Take 1 tablet (40 mg total) by mouth daily., Disp: 30 tablet, Rfl: 6 .  pravastatin (PRAVACHOL) 20 MG tablet, Take 1 tablet  (20 mg total) by mouth every evening., Disp: 90 tablet, Rfl: 3 .  ranitidine (ZANTAC) 150 MG tablet, Take 150 mg by mouth 2 (two) times daily., Disp: , Rfl:   Past Medical History: Past Medical History  Diagnosis Date  . Ischemic cardiomyopathy     a. 06/2014 Echo: EF 60-65%.  Marland Kitchen CAD (coronary artery disease)     a. 2007 Ant MI/VF Arrest/PCI LAD w/ BMS;  b. 07/2015 Cath/PCI: LM nl, LAD 80 ISR (2.5x28 Synergy DES), LCX min irregs, OM1 99, RCA 50p.  . Ventricular fibrillation (HCC)     a. 2007->VF Arrest.  . Hypertensive heart disease     unspecified  . Hyperlipidemia, mixed   . Anxiety   . Depression   . Dysphagia, unspecified(787.20)   . History of seizure disorder   . Brain anoxic injury (HCC)     a. 2007 in setting of VF arrest.  . GERD (gastroesophageal reflux disease)   . Memory loss     a. since VF arrest and anoxic brain injury in 2007.    Tobacco Use: History  Smoking status  . Never Smoker   Smokeless tobacco  . Former Neurosurgeon  . Types: Chew  . Quit date: 09/03/2005    Labs:     Recent Review Flowsheet Data  Labs for ITP Cardiac and Pulmonary Rehab Latest Ref Rng 06/17/2007 04/21/2008 12/01/2008 12/02/2008 07/16/2009   Cholestrol 0-200 mg/dL 409 ATP III CLASSIFICATION: <200     mg/dL   Desirable 811-914  mg/dL   Borderline High >=782    mg/dL   High 956 - 213 ATP III CLASSIFICATION: <200     mg/dL   Desirable 086-578  mg/dL   Borderline High >=469    mg/dL   High 629   LDLCALC 5-28 mg/dL 82 Total Cholesterol/HDL:CHD Risk Coronary Heart Disease Risk Table Men   Women 1/2 Average Risk   3.4   3.3 82 - 83 Total Cholesterol/HDL:CHD Risk Coronary Heart Disease Risk Table Men   Women 1/2 Average Risk   3.4   3.3 Average Risk       5.0   4.4 2 X Average Risk   9.6   7.1 3 X Average Risk  23.4   11.0 Use the calculated Patient Ratio above and the CHD Risk Table to determine the patient's CHD Risk. ATP III CLASSIFICATION (LDL): <100     mg/dL   Optimal 413-244   mg/dL   Near or Above Optimal 130-159  mg/dL   Borderline 010-272  mg/dL   High >536     mg/dL   Very High 91   HDL >64.40 mg/dL 34(V) 38.5(L) - 29(L) 40.70   Trlycerides 0.0-149.0 mg/dL 61 94 - 425(Z) 56.3   Hemoglobin A1c 4.6 - 6.1 % - - 5.6 (NOTE) The ADA recommends the following therapeutic goal for glycemic control related to Hgb A1c measurement: Goal of therapy: <6.5 Hgb A1c  Reference: American Diabetes Association: Clinical Practice Recommendations 2010, Diabetes Care, 2010, 33: (Suppl 1). - -   TCO2 0 - 100 mmol/L - - 25 - -      Capillary Blood Glucose: No results found for: GLUCAP   Exercise Target Goals:    Exercise Program Goal: Individual exercise prescription set with THRR, safety & activity barriers. Participant demonstrates ability to understand and report RPE using BORG scale, to self-measure pulse accurately, and to acknowledge the importance of the exercise prescription.  Exercise Prescription Goal: Starting with aerobic activity 30 plus minutes a day, 3 days per week for initial exercise prescription. Provide home exercise prescription and guidelines that participant acknowledges understanding prior to discharge.  Activity Barriers & Risk Stratification:     Activity Barriers & Cardiac Risk Stratification - 09/04/15 1525    Activity Barriers & Cardiac Risk Stratification   Activity Barriers None   Cardiac Risk Stratification High      6 Minute Walk:     6 Minute Walk      09/04/15 1544       6 Minute Walk   Phase Initial     Distance 950 feet     Walk Time 6 minutes     # of Rest Breaks 0     MPH 1.8     METS 2.38     RPE 11     Perceived Dyspnea  11     VO2 Peak 10.28     Symptoms No     Resting HR 67 bpm     Resting BP 114/72 mmHg     Max Ex. HR 85 bpm     Max Ex. BP 140/80 mmHg     2 Minute Post BP 124/86 mmHg     Pre/Post BP   Baseline BP 114/72 mmHg     6 Minute BP 140/80 mmHg  Pre/Post BP? Yes        Initial Exercise  Prescription:     Initial Exercise Prescription - 09/04/15 1600    Date of Initial Exercise RX and Referring Provider   Date 09/04/15   Treadmill   MPH 1.5   Grade 0   Minutes 15   METs 2.15   NuStep   Level 2   Minutes 15   METs 1.5   Arm Ergometer   Level 1.5   Minutes 15   METs 1.5   Prescription Details   Frequency (times per week) 3   Intensity   THRR REST +  30   THRR 40-80% of Max Heartrate 107-146   Ratings of Perceived Exertion 11-13   Progression   Progression Continue to progress workloads to maintain intensity without signs/symptoms of physical distress.   Resistance Training   Training Prescription Yes   Weight 1.0   Reps 10-12      Perform Capillary Blood Glucose checks as needed.  Exercise Prescription Changes:      Exercise Prescription Changes      09/24/15 1300 10/27/15 1900         Exercise Review   Progression Yes Yes      Response to Exercise   Blood Pressure (Admit) 110/60 mmHg 98/76 mmHg      Blood Pressure (Exercise) 122/70 mmHg 110/52 mmHg      Blood Pressure (Exit) 100/66 mmHg 94/58 mmHg      Heart Rate (Admit) 64 bpm 65 bpm      Heart Rate (Exercise) 92 bpm 82 bpm      Heart Rate (Exit) 68 bpm 70 bpm      Rating of Perceived Exertion (Exercise) 10 10      Perceived Dyspnea (Exercise)  13      Symptoms No None      Comments  Patient has been out with shingles since 10/10/15.       Duration  Progress to 30 minutes of continuous aerobic without signs/symptoms of physical distress      Progression   Progression Continue to progress workloads to maintain intensity without signs/symptoms of physical distress. Continue to progress workloads to maintain intensity without signs/symptoms of physical distress.      Resistance Training   Training Prescription Yes Yes      Weight 2.0 2.0      Reps 10-12 10-12      Interval Training   Interval Training No No      NuStep   Level 2 2      Minutes 15 15      METs 1.8 1.7      Arm  Ergometer   Level 1.5 2      Minutes 15 15      METs 2 2.2      Home Exercise Plan   Plans to continue exercise at  Home      Frequency  Add 2 additional days to program exercise sessions.         Exercise Comments:      Exercise Comments      10/27/15 1946           Exercise Comments Patient is progressing appropriately.            Discharge Exercise Prescription (Final Exercise Prescription Changes):     Exercise Prescription Changes - 10/27/15 1900    Exercise Review   Progression Yes   Response to Exercise   Blood Pressure (Admit)  98/76 mmHg   Blood Pressure (Exercise) 110/52 mmHg   Blood Pressure (Exit) 94/58 mmHg   Heart Rate (Admit) 65 bpm   Heart Rate (Exercise) 82 bpm   Heart Rate (Exit) 70 bpm   Rating of Perceived Exertion (Exercise) 10   Perceived Dyspnea (Exercise) 13   Symptoms None   Comments Patient has been out with shingles since 10/10/15.    Duration Progress to 30 minutes of continuous aerobic without signs/symptoms of physical distress   Progression   Progression Continue to progress workloads to maintain intensity without signs/symptoms of physical distress.   Resistance Training   Training Prescription Yes   Weight 2.0   Reps 10-12   Interval Training   Interval Training No   NuStep   Level 2   Minutes 15   METs 1.7   Arm Ergometer   Level 2   Minutes 15   METs 2.2   Home Exercise Plan   Plans to continue exercise at Home   Frequency Add 2 additional days to program exercise sessions.      Nutrition:  Target Goals: Understanding of nutrition guidelines, daily intake of sodium 1500mg , cholesterol 200mg , calories 30% from fat and 7% or less from saturated fats, daily to have 5 or more servings of fruits and vegetables.  Biometrics:     Pre Biometrics - 09/04/15 1607    Pre Biometrics   Height 6\' 3"  (1.905 m)   Weight 248 lb 6.4 oz (112.674 kg)   Waist Circumference 44 inches   Hip Circumference 44 inches   Waist to Hip  Ratio 1 %   BMI (Calculated) 31.1   Triceps Skinfold 7 mm   % Body Fat 26.4 %   Grip Strength 102 kg   Flexibility 0 in   Single Leg Stand 30 seconds       Nutrition Therapy Plan and Nutrition Goals:     Nutrition Therapy & Goals - 09/04/15 1527    Intervention Plan   Intervention Nutrition handout(s) given to patient.   Expected Outcomes Short Term Goal: Understand basic principles of dietary content, such as calories, fat, sodium, cholesterol and nutrients.      Nutrition Discharge: Rate Your Plate Scores:     Nutrition Assessments - 09/04/15 1528    MEDFICTS Scores   Pre Score 9      Nutrition Goals Re-Evaluation:     Nutrition Goals Re-Evaluation      09/24/15 1455           Weight   Current Weight 252 lb 9 oz (114.562 kg)       Intervention Plan   Intervention Continue to educate, counsel and set short/long term goals regarding individualized specific personal dietary modifications.          Psychosocial: Target Goals: Acknowledge presence or absence of depression, maximize coping skills, provide positive support system. Participant is able to verbalize types and ability to use techniques and skills needed for reducing stress and depression.  Initial Review & Psychosocial Screening:     Initial Psych Review & Screening - 09/04/15 1530    Initial Review   Current issues with --  none   Family Dynamics   Good Support System? Yes   Concerns --  none   Barriers   Psychosocial barriers to participate in program There are no identifiable barriers or psychosocial needs.   Screening Interventions   Interventions Encouraged to exercise      Quality of Life Scores:  Quality of Life - 09/04/15 1619    Quality of Life Scores   Health/Function Pre 1932 %   Socioeconomic Pre 26.3 %   Psych/Spiritual Pre 27.3 %   Family Pre 24.3 %   GLOBAL Pre 22.76 %      PHQ-9:     Recent Review Flowsheet Data    [facility]    Decreased Interest 0   Down, Depressed, Hopeless 0   PHQ - 2 Score 0   Altered sleeping 0   Tired, decreased energy 0   Change in appetite 0   Feeling bad or failure about yourself  0   Trouble concentrating 0   Moving slowly or fidgety/restless 0   Suicidal thoughts 0   PHQ-9 Score 0   Difficult doing work/chores Not difficult at all      Psychosocial Evaluation and Intervention:     Psychosocial Evaluation - 09/04/15 1530    Psychosocial Evaluation & Interventions   Interventions Encouraged to exercise with the program and follow exercise prescription   Continued Psychosocial Services Needed No      Psychosocial Re-Evaluation:     Psychosocial Re-Evaluation      09/24/15 1458 10/26/15 1338         Psychosocial Re-Evaluation   Interventions Encouraged to attend Cardiac Rehabilitation for the exercise Encouraged to attend Cardiac Rehabilitation for the exercise      Continued Psychosocial Services Needed No No         Vocational Rehabilitation: Provide vocational rehab assistance to qualifying candidates.   Vocational Rehab Evaluation & Intervention:     Vocational Rehab - 09/04/15 1526    Initial Vocational Rehab Evaluation & Intervention   Assessment shows need for Vocational Rehabilitation No      Education: Education Goals: Education classes will be provided on a weekly basis, covering required topics. Participant will state understanding/return demonstration of topics presented.  Learning Barriers/Preferences:     Learning Barriers/Preferences - 09/04/15 1525    Learning Barriers/Preferences   Learning Barriers Inability to learn new things  patient has history of anoxic brain damage.    Learning Preferences Verbal Instruction      Education Topics: Hypertension, Hypertension Reduction -Define heart disease and high blood pressure. Discus how high blood pressure affects the body and ways to reduce high blood pressure.   Exercise and Your  Heart -Discuss why it is important to exercise, the FITT principles of exercise, normal and abnormal responses to exercise, and how to exercise safely.   Angina -Discuss definition of angina, causes of angina, treatment of angina, and how to decrease risk of having angina.   Cardiac Medications -Review what the following cardiac medications are used for, how they affect the body, and side effects that may occur when taking the medications.  Medications include Aspirin, Beta blockers, calcium channel blockers, ACE Inhibitors, angiotensin receptor blockers, diuretics, digoxin, and antihyperlipidemics.   Congestive Heart Failure -Discuss the definition of CHF, how to live with CHF, the signs and symptoms of CHF, and how keep track of weight and sodium intake.   Heart Disease and Intimacy -Discus the effect sexual activity has on the heart, how changes occur during intimacy as we age, and safety during sexual activity.   Smoking Cessation / COPD -Discuss different methods to quit smoking, the health benefits of quitting smoking, and the definition of COPD.          [facility]  Date  09/12/15   Educator  Hart Rochester   Instruction Review Code  2- meets goals/outcomes      Nutrition I: Fats -Discuss the types of cholesterol, what cholesterol does to the heart, and how cholesterol levels can be controlled.   Nutrition II: Labels -Discuss the different components of food labels and how to read food label   Heart Parts and Heart Disease -Discuss the anatomy of the heart, the pathway of blood circulation through the heart, and these are affected by heart disease.   Stress I: Signs and Symptoms -Discuss the causes of stress, how stress may lead to anxiety and depression, and ways to limit stress.   Stress II: Relaxation -Discuss different types of relaxation techniques to limit stress.   Warning Signs of  Stroke / TIA -Discuss definition of a stroke, what the signs and symptoms are of a stroke, and how to identify when someone is having stroke.   Knowledge Questionnaire Score:     Knowledge Questionnaire Score - 09/04/15 1526    Knowledge Questionnaire Score   Pre Score 21/24      Core Components/Risk Factors/Patient Goals at Admission:     Personal Goals and Risk Factors at Admission - 09/04/15 1528    Core Components/Risk Factors/Patient Goals on Admission    Weight Management Yes   Intervention Weight Management: Provide education and appropriate resources to help participant work on and attain dietary goals.   Expected Outcomes Short Term: Continue to assess and modify interventions until short term weight is achieved.;Long Term: Adherence to nutrition and physical activity/exercise program aimed toward attainment of established weight goal.   Hypertension Yes   Intervention Monitor prescription use compliance.;Provide education on lifestyle modifcations including regular physical activity/exercise, weight management, moderate sodium restriction and increased consumption of fresh fruit, vegetables, and low fat dairy, alcohol moderation, and smoking cessation.   Expected Outcomes Short Term: Continued assessment and intervention until BP is < 140/28mm HG in hypertensive participants. < 130/39mm HG in hypertensive participants with diabetes, heart failure or chronic kidney disease.   Lipids Yes   Intervention Provide education and support for participant on nutrition & aerobic/resistive exercise along with prescribed medications to achieve LDL 70mg , HDL >40mg .   Expected Outcomes Short Term: Participant states understanding of desired cholesterol values and is compliant with medications prescribed. Participant is following exercise prescription and nutrition guidelines.      Core Components/Risk Factors/Patient Goals Review:      Goals and Risk Factor Review      09/24/15 1457  10/26/15 1336         Core Components/Risk Factors/Patient Goals Review   Personal Goals Review Weight Management/Obesity;Hypertension;Lipids       Review Will continue to education patient/wife on these topics.  Patient has gained 4 lbs since first session.  Will continue to education patient/wife on these topics.  Patient has not lost any weight at this time.       Expected Outcomes  lose weight and keep BP and lipids WNL.          Core Components/Risk Factors/Patient Goals at Discharge (Final Review):      Goals and Risk Factor Review - 10/26/15 1336    Core Components/Risk Factors/Patient Goals Review   Review Will continue to education patient/wife on these topics.  Patient has not lost any weight at this time.    Expected Outcomes lose weight and keep BP and lipids WNL.       ITP Comments:  ITP Comments      09/04/15 1526           ITP Comments Patient is a 55 year old male that lives with his wife.  patient enjoys eating and playing ball with kids. patient humorous and high spirited. Has history of anoxic brain damage.  wife states he forgets things in 5 minutes.            Comments: ITP REVIEW Pt is making expected progress toward personal goals after completing 15 sessions.   Recommend continued exercise, life style modification, education, and utilization of home exercise program and breathing techniques to increase stamina and strength.

## 2015-10-29 ENCOUNTER — Encounter (HOSPITAL_COMMUNITY): Payer: 59

## 2015-10-31 ENCOUNTER — Encounter (HOSPITAL_COMMUNITY): Payer: 59

## 2015-11-02 ENCOUNTER — Encounter (HOSPITAL_COMMUNITY): Payer: 59

## 2015-11-05 ENCOUNTER — Encounter (HOSPITAL_COMMUNITY): Payer: 59

## 2015-11-07 ENCOUNTER — Encounter (HOSPITAL_COMMUNITY): Payer: 59

## 2015-11-09 ENCOUNTER — Encounter (HOSPITAL_COMMUNITY): Payer: 59

## 2015-11-12 ENCOUNTER — Encounter (HOSPITAL_COMMUNITY): Payer: 59

## 2015-11-14 ENCOUNTER — Encounter (HOSPITAL_COMMUNITY): Payer: 59

## 2015-11-16 ENCOUNTER — Encounter (HOSPITAL_COMMUNITY): Payer: 59

## 2015-11-19 ENCOUNTER — Encounter (HOSPITAL_COMMUNITY): Payer: 59

## 2015-11-21 ENCOUNTER — Encounter (HOSPITAL_COMMUNITY): Payer: 59

## 2015-11-23 ENCOUNTER — Encounter (HOSPITAL_COMMUNITY): Payer: 59

## 2015-11-26 ENCOUNTER — Encounter (HOSPITAL_COMMUNITY): Payer: 59

## 2015-11-28 ENCOUNTER — Encounter (HOSPITAL_COMMUNITY): Payer: 59

## 2015-11-29 NOTE — Addendum Note (Signed)
Encounter addended by: Andrey Cotahristy M Nathaneal Sommers, RN on: 11/29/2015  4:22 PM<BR>     Documentation filed: Clinical Notes

## 2015-11-29 NOTE — Progress Notes (Signed)
Discharge Summary  Patient Details  Name: [name] Cummings MRN: [id number] Date of Birth: 04/26/1961 Referring Provider:     Number of Visits: 15  Reason for Discharge:  Early Exit:  Personal  Smoking History:  History  Smoking status  . Never Smoker   Smokeless tobacco  . Former Neurosurgeon  . Types: Chew  . Quit date: 09/03/2005    Diagnosis:  No diagnosis found.  ADL UCSD:   Initial Exercise Prescription:     Initial Exercise Prescription - 09/04/15 1600    Date of Initial Exercise RX and Referring Provider   Date 09/04/15   Treadmill   MPH 1.5   Grade 0   Minutes 15   METs 2.15   NuStep   Level 2   Minutes 15   METs 1.5   Arm Ergometer   Level 1.5   Minutes 15   METs 1.5   Prescription Details   Frequency (times per week) 3   Intensity   THRR REST +  30   THRR 40-80% of Max Heartrate 107-146   Ratings of Perceived Exertion 11-13   Progression   Progression Continue to progress workloads to maintain intensity without signs/symptoms of physical distress.   Resistance Training   Training Prescription Yes   Weight 1.0   Reps 10-12      Discharge Exercise Prescription (Final Exercise Prescription Changes):     Exercise Prescription Changes - 10/27/15 1900    Exercise Review   Progression Yes   Response to Exercise   Blood Pressure (Admit) 98/76 mmHg   Blood Pressure (Exercise) 110/52 mmHg   Blood Pressure (Exit) 94/58 mmHg   Heart Rate (Admit) 65 bpm   Heart Rate (Exercise) 82 bpm   Heart Rate (Exit) 70 bpm   Rating of Perceived Exertion (Exercise) 10   Perceived Dyspnea (Exercise) 13   Symptoms None   Comments Patient has been out with shingles since 10/10/15.    Duration Progress to 30 minutes of continuous aerobic without signs/symptoms of physical distress   Progression   Progression Continue to progress workloads to maintain intensity without signs/symptoms of physical distress.   Resistance Training   Training Prescription Yes   Weight 2.0    Reps 10-12   Interval Training   Interval Training No   NuStep   Level 2   Minutes 15   METs 1.7   Arm Ergometer   Level 2   Minutes 15   METs 2.2   Home Exercise Plan   Plans to continue exercise at Home   Frequency Add 2 additional days to program exercise sessions.      Functional Capacity:     6 Minute Walk      09/04/15 1544       6 Minute Walk   Phase Initial     Distance 950 feet     Walk Time 6 minutes     # of Rest Breaks 0     MPH 1.8     METS 2.38     RPE 11     Perceived Dyspnea  11     VO2 Peak 10.28     Symptoms No     Resting HR 67 bpm     Resting BP 114/72 mmHg     Max Ex. HR 85 bpm     Max Ex. BP 140/80 mmHg     2 Minute Post BP 124/86 mmHg     Pre/Post BP   Baseline BP  114/72 mmHg     6 Minute BP 140/80 mmHg     Pre/Post BP? Yes        Psychological, QOL, Others - Outcomes: PHQ 2/9: Depression screen PHQ 2/9 09/04/2015  Decreased Interest 0  Down, Depressed, Hopeless 0  PHQ - 2 Score 0  Altered sleeping 0  Tired, decreased energy 0  Change in appetite 0  Feeling bad or failure about yourself  0  Trouble concentrating 0  Moving slowly or fidgety/restless 0  Suicidal thoughts 0  PHQ-9 Score 0  Difficult doing work/chores Not difficult at all    Quality of Life:     Quality of Life - 09/04/15 1619    Quality of Life Scores   Health/Function Pre 1932 %   Socioeconomic Pre 26.3 %   Psych/Spiritual Pre 27.3 %   Family Pre 24.3 %   GLOBAL Pre 22.76 %      Personal Goals: Goals established at orientation with interventions provided to work toward goal.     Personal Goals and Risk Factors at Admission - 09/04/15 1528    Core Components/Risk Factors/Patient Goals on Admission    Weight Management Yes   Intervention Weight Management: Provide education and appropriate resources to help participant work on and attain dietary goals.   Expected Outcomes Short Term: Continue to assess and modify interventions until short term  weight is achieved.;Long Term: Adherence to nutrition and physical activity/exercise program aimed toward attainment of established weight goal.   Hypertension Yes   Intervention Monitor prescription use compliance.;Provide education on lifestyle modifcations including regular physical activity/exercise, weight management, moderate sodium restriction and increased consumption of fresh fruit, vegetables, and low fat dairy, alcohol moderation, and smoking cessation.   Expected Outcomes Short Term: Continued assessment and intervention until BP is < 140/5790mm HG in hypertensive participants. < 130/7580mm HG in hypertensive participants with diabetes, heart failure or chronic kidney disease.   Lipids Yes   Intervention Provide education and support for participant on nutrition & aerobic/resistive exercise along with prescribed medications to achieve LDL 70mg , HDL >40mg .   Expected Outcomes Short Term: Participant states understanding of desired cholesterol values and is compliant with medications prescribed. Participant is following exercise prescription and nutrition guidelines.       Personal Goals Discharge:     Goals and Risk Factor Review      09/24/15 1457 10/26/15 1336         Core Components/Risk Factors/Patient Goals Review   Personal Goals Review Weight Management/Obesity;Hypertension;Lipids       Review Will continue to education patient/wife on these topics.  Patient has gained 4 lbs since first session.  Will continue to education patient/wife on these topics.  Patient has not lost any weight at this time.       Expected Outcomes  lose weight and keep BP and lipids WNL.          Nutrition & Weight - Outcomes:     Pre Biometrics - 09/04/15 1607    Pre Biometrics   Height 6\' 3"  (1.905 m)   Weight 248 lb 6.4 oz (112.674 kg)   Waist Circumference 44 inches   Hip Circumference 44 inches   Waist to Hip Ratio 1 %   BMI (Calculated) 31.1   Triceps Skinfold 7 mm   % Body Fat 26.4 %    Grip Strength 102 kg   Flexibility 0 in   Single Leg Stand 30 seconds       Nutrition:  Nutrition Therapy & Goals - 09/04/15 1527    Intervention Plan   Intervention Nutrition handout(s) given to patient.   Expected Outcomes Short Term Goal: Understand basic principles of dietary content, such as calories, fat, sodium, cholesterol and nutrients.      Nutrition Discharge:     Nutrition Assessments - 09/04/15 1528    MEDFICTS Scores   Pre Score 9      Education Questionnaire Score:     Knowledge Questionnaire Score - 09/04/15 1526    Knowledge Questionnaire Score   Pre Score 21/24

## 2015-11-30 ENCOUNTER — Encounter: Payer: Self-pay | Admitting: Adult Health

## 2015-11-30 ENCOUNTER — Ambulatory Visit (INDEPENDENT_AMBULATORY_CARE_PROVIDER_SITE_OTHER): Payer: 59 | Admitting: Adult Health

## 2015-11-30 VITALS — BP 114/60 | HR 67 | Ht 75.0 in | Wt 251.0 lb

## 2015-11-30 DIAGNOSIS — I2510 Atherosclerotic heart disease of native coronary artery without angina pectoris: Secondary | ICD-10-CM | POA: Diagnosis not present

## 2015-11-30 DIAGNOSIS — I10 Essential (primary) hypertension: Secondary | ICD-10-CM

## 2015-11-30 DIAGNOSIS — G894 Chronic pain syndrome: Secondary | ICD-10-CM

## 2015-11-30 NOTE — Progress Notes (Signed)
Cardiology Office Note   Date:  11/30/2015   ID:  [name] Cummings, DOB 1960-09-05, MRN [id number]  PCP:  [name], MD  Cardiologist: [name], NP   No chief complaint on file.     History of Present Illness: [name] Cummings is a 55 y.o. male who presents for ongoing assessment and management of coronary artery disease with history of bare metal stent to the LAD in 2007, which was followed by stent thrombosis resulting in VF arrest. The LAD was restented at that time. Repeat cath in 07/2015: Jan 2017 severe ISR of LAD stent, received another DES. Small OM1 subtotally occluded with left to left collaterals.other history includes hypertension and hyperlipidemia.  The patient was last seen by Dr. Wyline Mood in March of 2017, and had been complaining of some chest discomfort but was not found to be consistent with cardiac etiology. The patient's Imdur was increased to 30 mg daily. She did have some myalgias on atorvastatin which was discontinued and pravastatin was started.she was continued on cardiac rehabilitation.  He comes today with complaints of chronic pain. His wife is frustrated that we are unable to relieve this for him. The patient does not like to exercise stating that if he does he feels pain and he gets short of breath. Medication adjustments have not made a difference according to his wife. The patient does have an anoxic brain injury and therefore is a difficult historian. He does not remember anything that happened within the last 24 hours. He has not done well in cardiac rehabilitation as he prefers not to participate.    Past Medical History  Diagnosis Date  . Ischemic cardiomyopathy     a. 06/2014 Echo: EF 60-65%.  Marland Kitchen CAD (coronary artery disease)     a. 2007 Ant MI/VF Arrest/PCI LAD w/ BMS;  b. 07/2015 Cath/PCI: LM nl, LAD 80 ISR (2.5x28 Synergy DES), LCX min irregs, OM1 99, RCA 50p.  . Ventricular fibrillation (HCC)     a. 2007->VF Arrest.  . Hypertensive heart  disease     unspecified  . Hyperlipidemia, mixed   . Anxiety   . Depression   . Dysphagia, unspecified(787.20)   . History of seizure disorder   . Brain anoxic injury (HCC)     a. 2007 in setting of VF arrest.  . GERD (gastroesophageal reflux disease)   . Memory loss     a. since VF arrest and anoxic brain injury in 2007.    Past Surgical History  Procedure Laterality Date  . Stent thrombosis  03/2006    Complicated my sudden cardiac death  . Kidney stone surgery    . Cardiac catheterization  09/2005    with bare metal stent to the mid LAD (Minivision 2.5 x 18 mm) with Provisional balloon angioplasty to the second diagonal.  . Cardiac catheterization  03/2006    with stenting of the mid LAD with Taxus 2.5 x 20 mm and 2.5 x 8 mm stents.      . Bedside tracheostomy      with #6 Shiley. 04/14/2006  . Esophagogastroduodenoscopy    . Peg tube placement    . Cardiac catheterization N/A 07/20/2015    Procedure: Left Heart Cath and Coronary Angiography;  Surgeon: Corky Crafts, MD;  Location: [facility] INVASIVE CV LAB;  Service: Cardiovascular;  Laterality: N/A;  . Cardiac catheterization N/A 07/20/2015    Procedure: Coronary Stent Intervention;  Surgeon: Corky Crafts, MD;  Location: [facility] INVASIVE CV LAB;  Service: Cardiovascular;  Laterality: N/A;  . Tracheostomy closure    . Peg tube removal    . Coronary stent placement  07/20/2015    LAD with DES     Current Outpatient Prescriptions  Medication Sig Dispense Refill  . aspirin EC 81 MG tablet Take 81 mg by mouth daily.    . carvedilol (COREG) 25 MG tablet Take 25 mg by mouth 2 (two) times daily with a meal.      . clopidogrel (PLAVIX) 75 MG tablet Take 1 tablet (75 mg total) by mouth daily. 30 tablet 6  . dexamethasone (DECADRON) 4 MG tablet Take 1 tablet (4 mg total) by mouth 2 (two) times daily with a meal. 12 tablet 0  . famciclovir (FAMVIR) 500 MG tablet Take 1 tablet (500 mg total) by mouth 3 (three) times daily. 21 tablet  0  . FLUoxetine (PROZAC) 20 MG capsule Take 20 mg by mouth at bedtime.     . fluticasone (FLONASE) 50 MCG/ACT nasal spray Place 1 spray into both nostrils daily as needed for allergies.     . furosemide (LASIX) 20 MG tablet Take 20 mg by mouth daily.     Marland Kitchen. HYDROcodone-acetaminophen (NORCO) 5-325 MG tablet Take 1-2 tablets by mouth every 4 (four) hours as needed for moderate pain. 20 tablet 0  . isosorbide mononitrate (IMDUR) 30 MG 24 hr tablet Take 1 tablet (30 mg total) by mouth daily. 90 tablet 3  . loratadine (CLARITIN) 10 MG tablet Take 10 mg by mouth daily.    . nitroGLYCERIN (NITROSTAT) 0.4 MG SL tablet Place 1 tablet (0.4 mg total) under the tongue every 5 (five) minutes as needed for chest pain. 25 tablet 3  . pantoprazole (PROTONIX) 40 MG tablet Take 1 tablet (40 mg total) by mouth daily. 30 tablet 6  . pravastatin (PRAVACHOL) 20 MG tablet Take 1 tablet (20 mg total) by mouth every evening. 90 tablet 3  . ranitidine (ZANTAC) 150 MG tablet Take 150 mg by mouth 2 (two) times daily.     No current facility-administered medications for this visit.    Allergies:   Hydromorphone hcl; Lorazepam; Pseudoephedrine; Terfenadine; and Atorvastatin    Social History:  The patient  reports that he has never smoked. He quit smokeless tobacco use about 10 years ago. His smokeless tobacco use included Chew. He reports that he does not drink alcohol or use illicit drugs.   Family History:  The patient's family history includes Heart attack in his father and mother. There is no history of Coronary artery disease.    ROS: All other systems are reviewed and negative. Unless otherwise mentioned in H&P    PHYSICAL EXAM: VS:  BP 114/60 mmHg  Pulse 67  Ht 6\' 3"  (1.905 m)  Wt 251 lb (113.853 kg)  BMI 31.37 kg/m2  SpO2 95% , BMI Body mass index is 31.37 kg/(m^2). GEN: Well nourished, well developed, in no acute distress HEENT: normal Neck: no JVD, carotid bruits, or masses Cardiac: RRR; no murmurs,  rubs, or gallops,no edema  Respiratory:  clear to auscultation bilaterally, normal work of breathing GI: soft, nontender, nondistended, + BS MS: no deformity or atrophy Skin: warm and dry, no rash Neuro:  Strength and sensation are intact Psych: euthymic mood, full affect    Recent Labs: 08/10/2015: ALT 19; B Natriuretic Peptide 22.0; BUN 41*; Creatinine, Ser 1.08; Hemoglobin 12.2*; Platelets 263; Potassium 4.4; Sodium 143    Lipid Panel    Component Value Date/Time  CHOL 148 07/16/2009 0915   TRIG 80.0 07/16/2009 0915   HDL 40.70 07/16/2009 0915   CHOLHDL 4 07/16/2009 0915   VLDL 16.0 07/16/2009 0915   LDLCALC 91 07/16/2009 0915      Wt Readings from Last 3 Encounters:  11/30/15 251 lb (113.853 kg)  10/12/15 247 lb (112.038 kg)  09/20/15 248 lb (112.492 kg)    ASSESSMENT AND PLAN:  1.  Coronary artery disease: Status post bare-metal stents to LAD in 2007 with restenting at the same site after a V. Fib arrest. His repeat catheterization in January 2017 revealed severe in-stent restenosis and he received another drug-eluting stent. He remains on dual antiplatelet therapy which she will for life. He needs to have chronic shortness of breath and chest pain. If he does not remember how often it happens or if he felt that the day before.  His wife is very attentive to him that has become frustrated with his constant discomfort. I've explained to her that if he begins to have chest discomfort out of his ordinary chronic pain, worsening shortness of breath, or significant fatigue, she should have him seen again otherwise we'll see him in 6 months. He will continue on carvedilol 25 mg twice a day, dual antiplatelet therapy and statin therapy.  2. Hypertension: blood pressure is currently well-controlled. We'll not make any changes at this time. Imdur is not causing significant hypotension.  3. Chronic pain:he has several other reasons to have discomfort. He is recently suffered from  shingles, his anoxic brain injury also causes significant neurologic pain. He has been seen by a neurologist. His wife does not wish to take it anymore doctors or receive any further second opinions.  Current medicines are reviewed at length with the patient today.    Labs/ tests ordered today include: None No orders of the defined types were placed in this encounter.     Disposition:   FU with 6 months.  Signed, Joni Reining, NP  11/30/2015 3:54 PM    [facility] [address] [place], [place] [address] Phone: [phone]; Fax: [phone]

## 2015-11-30 NOTE — Progress Notes (Deleted)
Name: [name] Cummings    DOB: 05/16/1961  Age: 55 y.o.  MR#: [id number]       PCP:  Delorse LekBURNETT,BRENT A, MD      Insurance: Payor: [employer] / Plan: [employer] OTHER / Product Type: *No Product type* /   CC:   No chief complaint on file.   VS Filed Vitals:   11/30/15 1541  BP: 114/60  Pulse: 67  Height: 6\' 3"  (1.905 m)  Weight: 251 lb (113.853 kg)  SpO2: 95%    Weights Current Weight  11/30/15 251 lb (113.853 kg)  10/12/15 247 lb (112.038 kg)  09/20/15 248 lb (112.492 kg)    Blood Pressure  BP Readings from Last 3 Encounters:  11/30/15 114/60  10/12/15 123/91  09/20/15 100/60     Admit date:  (Not on file) Last encounter with RMR:  08/06/2015   Allergy Hydromorphone hcl; Lorazepam; Pseudoephedrine; Terfenadine; and Atorvastatin  Current Outpatient Prescriptions  Medication Sig Dispense Refill  . aspirin EC 81 MG tablet Take 81 mg by mouth daily.    . carvedilol (COREG) 25 MG tablet Take 25 mg by mouth 2 (two) times daily with a meal.      . clopidogrel (PLAVIX) 75 MG tablet Take 1 tablet (75 mg total) by mouth daily. 30 tablet 6  . dexamethasone (DECADRON) 4 MG tablet Take 1 tablet (4 mg total) by mouth 2 (two) times daily with a meal. 12 tablet 0  . famciclovir (FAMVIR) 500 MG tablet Take 1 tablet (500 mg total) by mouth 3 (three) times daily. 21 tablet 0  . FLUoxetine (PROZAC) 20 MG capsule Take 20 mg by mouth at bedtime.     . fluticasone (FLONASE) 50 MCG/ACT nasal spray Place 1 spray into both nostrils daily as needed for allergies.     . furosemide (LASIX) 20 MG tablet Take 20 mg by mouth daily.     Marland Kitchen. HYDROcodone-acetaminophen (NORCO) 5-325 MG tablet Take 1-2 tablets by mouth every 4 (four) hours as needed for moderate pain. 20 tablet 0  . isosorbide mononitrate (IMDUR) 30 MG 24 hr tablet Take 1 tablet (30 mg total) by mouth daily. 90 tablet 3  . loratadine (CLARITIN) 10 MG tablet Take 10 mg by mouth daily.    . nitroGLYCERIN (NITROSTAT) 0.4 MG SL tablet  Place 1 tablet (0.4 mg total) under the tongue every 5 (five) minutes as needed for chest pain. 25 tablet 3  . pantoprazole (PROTONIX) 40 MG tablet Take 1 tablet (40 mg total) by mouth daily. 30 tablet 6  . pravastatin (PRAVACHOL) 20 MG tablet Take 1 tablet (20 mg total) by mouth every evening. 90 tablet 3  . ranitidine (ZANTAC) 150 MG tablet Take 150 mg by mouth 2 (two) times daily.     No current facility-administered medications for this visit.    Discontinued Meds:   There are no discontinued medications.  Patient Active Problem List   Diagnosis Date Noted  . Numbness and tingling of left side of face 08/16/2015  . Anxiety 07/21/2015  . Unstable angina (HCC) 07/21/2015  . Hypertensive heart disease   . Hyperlipidemia, mixed   . CAD (coronary artery disease)   . Brain anoxic injury (HCC)   . Angina pectoris (HCC)   . 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 CAD, NATIVE VESSEL 07/16/2009  . 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 HYPERLIPIDEMIA-MIXED 11/10/2008  . Anxiety state 11/10/2008  . Depression 11/10/2008  . HYPERTENSION, UNSPECIFIED 11/10/2008  . CARDIOMYOPATHY, ISCHEMIC 11/10/2008  .  VENTRICULAR FIBRILLATION 11/10/2008  . DYSPHAGIA UNSPECIFIED 11/10/2008  . SEIZURE DISORDER, HX OF 11/10/2008    LABS    Component Value Date/Time   NA 143 08/10/2015 1019   NA 137 07/21/2015 0546   NA 139 07/20/2015 0757   K 4.4 08/10/2015 1019   K 4.1 07/21/2015 0546   K 4.1 07/20/2015 0757   CL 109 08/10/2015 1019   CL 105 07/21/2015 0546   CL 101 07/20/2015 0757   CO2 24 08/10/2015 1019   CO2 22 07/21/2015 0546   CO2 26 07/20/2015 0757   GLUCOSE 157* 08/10/2015 1019   GLUCOSE 128* 07/21/2015 0546   GLUCOSE 109* 07/20/2015 0757   BUN 41* 08/10/2015 1019   BUN 12 07/21/2015 0546   BUN 9 07/20/2015 0757   CREATININE 1.08 08/10/2015 1019   CREATININE 1.11 07/21/2015 0546   CREATININE 1.01 07/20/2015 0757   CALCIUM 9.2 08/10/2015 1019   CALCIUM 8.7* 07/21/2015 0546    CALCIUM 9.1 07/20/2015 0757   GFRNONAA >60 08/10/2015 1019   GFRNONAA >60 07/21/2015 0546   GFRNONAA >60 07/20/2015 0757   GFRAA >60 08/10/2015 1019   GFRAA >60 07/21/2015 0546   GFRAA >60 07/20/2015 0757   CMP     Component Value Date/Time   NA 143 08/10/2015 1019   K 4.4 08/10/2015 1019   CL 109 08/10/2015 1019   CO2 24 08/10/2015 1019   GLUCOSE 157* 08/10/2015 1019   BUN 41* 08/10/2015 1019   CREATININE 1.08 08/10/2015 1019   CALCIUM 9.2 08/10/2015 1019   PROT 6.4* 08/10/2015 1019   ALBUMIN 3.8 08/10/2015 1019   AST 17 08/10/2015 1019   ALT 19 08/10/2015 1019   ALKPHOS 106 08/10/2015 1019   BILITOT 0.7 08/10/2015 1019   GFRNONAA >60 08/10/2015 1019   GFRAA >60 08/10/2015 1019       Component Value Date/Time   WBC 10.7* 08/10/2015 1019   WBC 6.5 07/21/2015 0546   WBC 6.1 07/20/2015 0757   HGB 12.2* 08/10/2015 1019   HGB 12.9* 07/21/2015 0546   HGB 14.0 07/20/2015 0757   HCT 35.4* 08/10/2015 1019   HCT 39.8 07/21/2015 0546   HCT 41.0 07/20/2015 0757   MCV 84.9 08/10/2015 1019   MCV 85.4 07/21/2015 0546   MCV 84.7 07/20/2015 0757    Lipid Panel     Component Value Date/Time   CHOL 148 07/16/2009 0915   TRIG 80.0 07/16/2009 0915   HDL 40.70 07/16/2009 0915   CHOLHDL 4 07/16/2009 0915   VLDL 16.0 07/16/2009 0915   LDLCALC 91 07/16/2009 0915    ABG    Component Value Date/Time   HCO3 28.8* 06/16/2007 2219   TCO2 25 12/01/2008 1635     Lab Results  Component Value Date   TSH 2.284 ***Test methodology is 3rd generation TSH*** 12/01/2008   BNP (last 3 results)  Recent Labs  08/10/15 1019  BNP 22.0    ProBNP (last 3 results) No results for input(s): PROBNP in the last 8760 hours.  Cardiac Panel (last 3 results) No results for input(s): CKTOTAL, CKMB, TROPONINI, RELINDX in the last 72 hours.  Iron/TIBC/Ferritin/ %Sat No results found for: IRON, TIBC, FERRITIN, IRONPCTSAT   EKG Orders placed or performed during the hospital encounter of  10/12/15  . ED EKG  . ED EKG  . EKG     Prior Assessment and Plan Problem List as of 11/30/2015      Cardiovascular and Mediastinum   HYPERTENSION, UNSPECIFIED   CAD, NATIVE  VESSEL   Last Assessment & Plan 07/03/2011 Office Visit Written 07/03/2011 10:07 AM by Gaylord Shih, MD    Stable. No change in medical therapy.      CARDIOMYOPATHY, ISCHEMIC   VENTRICULAR FIBRILLATION   Old MI (myocardial infarction)   Angina pectoris (HCC)   Hypertensive heart disease   CAD (coronary artery disease)   Unstable angina (HCC)     Digestive   DYSPHAGIA UNSPECIFIED     Nervous and Auditory   ANOXIC BRAIN DAMAGE   Brain anoxic injury [facility])     Other   HYPERLIPIDEMIA-MIXED   Last Assessment & Plan 07/03/2011 Office Visit Written 07/03/2011 10:08 AM by Gaylord Shih, MD    Followed by primary care. Crestor renewed however.      Anxiety state   Depression   SEIZURE DISORDER, HX OF   Hyperlipidemia, mixed   Anxiety   Numbness and tingling of left side of face       Imaging: No results found.

## 2015-11-30 NOTE — Patient Instructions (Signed)
Your physician wants you to follow-up in: 6 Months with Dr. Branch. You will receive a reminder letter in the mail two months in advance. If you don't receive a letter, please call our office to schedule the follow-up appointment.  Your physician recommends that you continue on your current medications as directed. Please refer to the Current Medication list given to you today.  If you need a refill on your cardiac medications before your next appointment, please call your pharmacy.  Thank you for choosing [facility]!   

## 2016-10-01 ENCOUNTER — Encounter: Payer: Self-pay | Admitting: Cardiology

## 2016-10-01 ENCOUNTER — Ambulatory Visit (INDEPENDENT_AMBULATORY_CARE_PROVIDER_SITE_OTHER): Payer: 59 | Admitting: Cardiology

## 2016-10-01 VITALS — BP 109/67 | HR 64 | Ht 74.0 in | Wt 234.6 lb

## 2016-10-01 DIAGNOSIS — I2510 Atherosclerotic heart disease of native coronary artery without angina pectoris: Secondary | ICD-10-CM

## 2016-10-01 DIAGNOSIS — E782 Mixed hyperlipidemia: Secondary | ICD-10-CM

## 2016-10-01 DIAGNOSIS — I10 Essential (primary) hypertension: Secondary | ICD-10-CM

## 2016-10-01 NOTE — Progress Notes (Signed)
Clinical Summary [name] Cummings is a 56 y.o.male seen today for follow up of the following medical problems.   1. CAD - hx of BMS to LAD in 2007, followed by stent thrombosis with resulting VF arrest. LAD was restented at that time. - cath 2010 LAD 50% prox, 30% ISR in mid LAD, distal LAD 70-80%, LCX 40% ostial, 70-80% narrowing proximal portion LCX in the ramus Navid Lenzen, RCA 70-80% proximal. LVEF 45%, the distal LAD was stented.  - cath Jan 2017 severe ISR of LAD stent, received another DES. Small OM1 subtotally occluded with left to left collaterals - echo Jan 2016 LVEF 60-65% - seen [facility] 08[facility]2015 with atypical chest pain, no evidence of ACS. Imdur was added.     - left pectoral, pressure like pain down left arm. Unchanged for over 1 year. Can occur at anytime. - no SOB or DOE. - compliant with meds   2. HTN - he remains compliant with meds  3. Hyperlipidemia - leg pains on low dose atorva. Has tolerated low dose pravstatin.   4. 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 Patient is not able to give description of symptoms, symptoms are all relayed through his wife.     Past Medical History:  Diagnosis Date  . Anxiety   . 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  . CAD (coronary artery disease)    a. 2007 Ant MI/VF Arrest/PCI LAD w/ BMS;  b. 07/2015 Cath/PCI: LM nl, LAD 80 ISR (2.5x28 Synergy DES), LCX min irregs, OM1 99, RCA 50p.  . Depression   . Dysphagia, unspecified(787.20)   . GERD (gastroesophageal reflux disease)   . History of seizure disorder   . Hyperlipidemia, mixed   . Hypertensive heart disease    unspecified  . Ischemic cardiomyopathy    a. 06/2014 Echo: EF 60-65%.  . 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  Marland Kitchen 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     Allergies  Allergen Reactions  . Hydromorphone Hcl     REACTION: Reaction not known  .  Lorazepam     REACTION: Reaction not known  . Pseudoephedrine     REACTION: Reaction not known  . Terfenadine     REACTION: Reaction not known  . Atorvastatin     Pt c/o myalgias but was told to break tab in 1/2 and take it and has tolerated medication fine since dose was cut in 1/2     Current Outpatient Prescriptions  Medication Sig Dispense Refill  . aspirin EC 81 MG tablet Take 81 mg by mouth daily.    . carvedilol (COREG) 25 MG tablet Take 25 mg by mouth 2 (two) times daily with a meal.      . clopidogrel (PLAVIX) 75 MG tablet Take 1 tablet (75 mg total) by mouth daily. 30 tablet 6  . dexamethasone (DECADRON) 4 MG tablet Take 1 tablet (4 mg total) by mouth 2 (two) times daily with a meal. 12 tablet 0  . famciclovir (FAMVIR) 500 MG tablet Take 1 tablet (500 mg total) by mouth 3 (three) times daily. 21 tablet 0  . FLUoxetine (PROZAC) 20 MG capsule Take 20 mg by mouth at bedtime.     . fluticasone (FLONASE) 50 MCG/ACT nasal spray Place 1 spray into both nostrils daily as needed for allergies.     Marland Kitchen  furosemide (LASIX) 20 MG tablet Take 20 mg by mouth daily.     Marland Kitchen HYDROcodone-acetaminophen (NORCO) 5-325 MG tablet Take 1-2 tablets by mouth every 4 (four) hours as needed for moderate pain. 20 tablet 0  . isosorbide mononitrate (IMDUR) 30 MG 24 hr tablet Take 1 tablet (30 mg total) by mouth daily. 90 tablet 3  . loratadine (CLARITIN) 10 MG tablet Take 10 mg by mouth daily.    . nitroGLYCERIN (NITROSTAT) 0.4 MG SL tablet Place 1 tablet (0.4 mg total) under the tongue every 5 (five) minutes as needed for chest pain. 25 tablet 3  . pantoprazole (PROTONIX) 40 MG tablet Take 1 tablet (40 mg total) by mouth daily. 30 tablet 6  . pravastatin (PRAVACHOL) 20 MG tablet Take 1 tablet (20 mg total) by mouth every evening. 90 tablet 3  . ranitidine (ZANTAC) 150 MG tablet Take 150 mg by mouth 2 (two) times daily.     No current facility-administered medications for this visit.      Past Surgical  History:  Procedure Laterality Date  . bedside tracheostomy     with #6 Shiley. 04/14/2006  . CARDIAC CATHETERIZATION  09/2005   with bare metal stent to the mid LAD (Minivision 2.5 x 18 mm) with Provisional balloon angioplasty to the second diagonal.  . CARDIAC CATHETERIZATION  03/2006   with stenting of the mid LAD with Taxus 2.5 x 20 mm and 2.5 x 8 mm stents.      Marland Kitchen CARDIAC CATHETERIZATION N/A 07/20/2015   Procedure: Left Heart Cath and Coronary Angiography;  Surgeon: Corky Crafts, MD;  Location: [facility] INVASIVE CV LAB;  Service: Cardiovascular;  Laterality: N/A;  . CARDIAC CATHETERIZATION N/A 07/20/2015   Procedure: Coronary Stent Intervention;  Surgeon: Corky Crafts, MD;  Location: [facility] INVASIVE CV LAB;  Service: Cardiovascular;  Laterality: N/A;  . CORONARY STENT PLACEMENT  07/20/2015   LAD with DES  . ESOPHAGOGASTRODUODENOSCOPY    . KIDNEY STONE SURGERY    . PEG TUBE PLACEMENT    . PEG TUBE REMOVAL    . stent thrombosis  03/2006   Complicated my sudden cardiac death  . TRACHEOSTOMY CLOSURE       Allergies  Allergen Reactions  . Hydromorphone Hcl     REACTION: Reaction not known  . Lorazepam     REACTION: Reaction not known  . Pseudoephedrine     REACTION: Reaction not known  . Terfenadine     REACTION: Reaction not known  . Atorvastatin     Pt c/o myalgias but was told to break tab in 1/2 and take it and has tolerated medication fine since dose was cut in 1/2      Family History  Problem Relation Age of Onset  . Heart attack Mother   . Heart attack Father   . Coronary artery disease Neg Hx     Siblings     Social History [name] Cummings reports that he has never smoked. He quit smokeless tobacco use about 11 years ago. His smokeless tobacco use included Chew. [name] Cummings reports that he does not drink alcohol.   Review of Systems CONSTITUTIONAL: No weight loss, fever, chills, weakness or fatigue.  HEENT: Eyes: No visual loss, blurred vision, double vision  or yellow sclerae.No hearing loss, sneezing, congestion, runny nose or sore throat.  SKIN: No rash or itching.  CARDIOVASCULAR: per hpi RESPIRATORY: No shortness of breath, cough or sputum.  GASTROINTESTINAL: No anorexia, nausea, vomiting or diarrhea. No abdominal pain  or blood.  GENITOURINARY: No burning on urination, no polyuria NEUROLOGICAL: No headache, dizziness, syncope, paralysis, ataxia, numbness or tingling in the extremities. No change in bowel or bladder control.  MUSCULOSKELETAL: No muscle, back pain, joint pain or stiffness.  LYMPHATICS: No enlarged nodes. No history of splenectomy.  PSYCHIATRIC: No history of depression or anxiety.  ENDOCRINOLOGIC: No reports of sweating, cold or heat intolerance. No polyuria or polydipsia.  Marland Kitchen   Physical Examination Vitals:   10/01/16 1518  BP: 109/67  Pulse: 64   Vitals:   10/01/16 1518  Weight: 234 lb 9.6 oz (106.4 kg)  Height:  (1.88 m)    Gen: resting comfortably, no acute distress HEENT: no scleral icterus, pupils equal round and reactive, no palptable cervical adenopathy,  CV: RRR, no m/rg, no jvd Resp: Clear to auscultation bilaterally GI: abdomen is soft, non-tender, non-distended, normal bowel sounds, no hepatosplenomegaly MSK: extremities are warm, no edema.  Skin: warm, no rash Neuro:  no focal deficits Psych: appropriate affect   Diagnostic Studies 11/2008 Cath RESULTS: Left main coronary artery: The left main coronary artery was free of significant disease.  Left anterior descending: The left anterior descending artery gave rise to small diagonal Sami Froh and small septal perforator, and larger diagonal Ben Sanz. There was 30% narrowing within the midportion of the previously placed stents. Distal to the stent, there was an eccentric lesion, which appeared to be a ruptured plaque, which was estimated to be 70-80% obstructive. There also was 40-50% diffuse narrowing in the proximal  LAD.  Circumflex artery: The circumflex artery gave rise to a large ramus Nicolas Sisler and two posterolateral branches and a posterior descending Corrie Reder. This was a dominant vessel. There was 70-80% narrowing in the proximal portion in the ramus Waylynn Benefiel which was extended over about 18 mm. There was also a 40% ostial narrowing in the circumflex artery and 50% narrowing in the ostium of the ramus Simrah Chatham.  Right coronary artery: The right coronary artery was a nondominant vessel which supplied only right ventricular branches. There was a 70- 80% proximal stenosis.  Left ventriculogram: The left ventriculogram was performed in the RAO projection showed hypokinesis of the anterolateral wall and apex. The estimated fraction was 45%.  Following stenting of the lesion in the mid LAD, the stenosis improved from 80% to 0%.  CONCLUSIONS: 1. Coronary artery disease, status post previous percutaneous coronary  interventions as described above with 40% narrowing in the proximal  LAD, 30% narrowing within the stent in the mid LAD, and 80%  narrowing in the mid LAD after the stent, 70-80% narrowing in the  large ramus Dorreen Valiente of the circumflex artery with 40% ostial  stenosis in the dominant circumflex artery, and 80% narrowing in  the proximal portion of a nondominant right coronary artery with  anterolateral wall and apical wall hypokinesis and estimated  ejection fraction of 45%. 2. Successful PCI of the lesion in the mid LAD distal to the  previously placed stent using a XIENCE drug-eluting stent with  improvement in center narrowing from 80% to 0%.  DISPOSITION: The patient returned to [facility] room for further observation.    Jan 2016 echo Study Conclusions  - Left ventricle: The cavity size was normal. Wall thickness was normal. Systolic function was normal. The estimated ejection fraction was in the range  of 60% to 65%. Indeterminant diastolic function. - Aortic valve: Mildly calcified annulus. Mildly thickened leaflets. - Mitral valve: Mildly calcified annulus. Mildly thickened leaflets . - Left atrium: The atrium  was mildly dilated. - Systemic veins: IVC is small, suggestive of low RA pressure and hypovolemia. - Technically adequate study.   07/10/15 Clinic EKG (performed and reviewed in clinic): Sinus bradycardia  Jan 2017 Lexiscan MPI  Perfusion Summary Defect 1:  There is a large defect of moderate severity present in the basal anterolateral, mid anterolateral and apical lateral location. The defect is partially reversible. Large, moderate intensity, partially reversible anterolateral defect consistent with scar and at least moderate peri-infarct ischemia.    Overall Study Impression Myocardial perfusion is abnormal. This is a high risk study. Overall left ventricular systolic function was abnormal. Nuclear stress EF: 43%.      Jan 2017 cath  OM1 subtotally occluded with left to left collaterals coming from the apical LAD.  Severe in-stent restenosis of prior bare-metal stent which was placed in 2007 in the setting of his cardiac arrest. This was treated with cutting balloon angioplasty anddrug-eluting stent placement with a 2.5 x 28 Synergy drug-eluting stent, postdilatto greater than 3 m  Continue dual antiplatelet therapy indefinitely. The patient has significant memory loss after his cardiac arrest. Will discuss with his wife about the importance of staying on his dual antiplatelet therapy.   He'll be watched overnight with anticipated discharge tomorrow     Assessment and Plan  1. CAD - long history of atypical chest pain that is unchanged - continue current meds  2 HTN - his bp is at goal, continue current meds  3. Hyperlipidemia - muscle aches on atorva. He will continue low dose pravastatin.   F/u 6 months. Wife is to fax his recent  labs.        Antoine Poche, M.D., F.A.C.C.

## 2016-10-01 NOTE — Patient Instructions (Signed)
Your physician wants you to follow-up in: 6 MONTHS WITH DR [facility] You will receive a reminder letter in the mail two months in advance. If you don't receive a letter, please call our office to schedule the follow-up appointment.  Your physician recommends that you continue on your current medications as directed. Please refer to the Current Medication list given to you today.  OUR FAX NUMBER [phone]  Thank you for choosing [facility]!!

## 2016-11-25 ENCOUNTER — Other Ambulatory Visit: Payer: Self-pay | Admitting: Cardiology

## 2017-04-20 ENCOUNTER — Encounter: Payer: Self-pay | Admitting: Physician Assistant

## 2017-04-20 NOTE — Progress Notes (Addendum)
Cardiology Office Note    Date:  04/21/2017  ID:  [name] Cummings, DOB 04-Jan-1961, MRN [id number] PCP:  Christain Sacramento, MD  Cardiologist:  Dr. Harl Bowie  Chief Complaint: weakness  History of Present Illness:   [name] Cummings is a 56 y.o. male with history of significant CAD (NSTEMI s/p BMS to mLAD and PTCA to D2 09/2015, anterior MI/stent thrombosis/VF arrest 59/7416 complicated by anoxic brain injury/PEG tube placement/tracheostomy, DES to LAD for ISR 07/2015), short-term memory deficit since anoxic event, ischemic cardiomyopathy, HTN, HLD (myalgias with lipitor), seizure disorder, depression, anxiety, asthma, nephrolithiasis who presents for evaluation of weakness. Last cath Jan 2017 showed small OM1 subtotally occluded with left to left collaterals, severe ISR of LAD requiring DES/CB angioplasty, also with 50% prox RCA. Last echo 06/2014 with EF 60-65%, mildly dilated LA, IVC small suggestive of hypovolemia. Last labs from 08/2015 indicated K 4.4, Cr 1.08, Hgb 12.2. Prior office notes also allude to history of chronic chest pain, arm tingling and dyspnea both at rest and with exertion. At [place] 08/2015, "History is difficult to collect as patient as severe memory deficit, symptoms are described by his wife. The description is not overally consistent with cardiac chest pain however given his history his family remains very apprehensive.  Will increase imdur to 55m daily and follow symptoms closely. " He last saw Dr. BHarl Bowie4/2018 who noted left pectoral pressure pain unchanged for 1 year but no specific therapies needed at that time.  He presents back for follow-up today. He's accompanied by his wife and daughter. He is a very tangential historian without ability to really recall details of his symptoms so his family answers for him. He's generally felt intermittently poorly for the last year and a half. They state that back in January 2017 he felt good for about 2 weeks after PCI. However, subsequent to that  he's had occasional episodes of left sided chest pain occasionally with tingling in his left arm. They last varying degrees ot ime. This occurs both with activity and at rest. He's also apparently reported some episodes of dizziness/generalized weakness at varying times, sometimes with exertion. On one occasion he reported dyspnea in the middle of the night accompanied with GI disstress. He has had good days and bad days over the last year but it sounds like his generalized fatigue has been getting worse the last 3 weeks. No syncope, LEE, orthopnea. He saw primary care 10/9 who was concerned for URI so he was prescribed antibiotics and prednisone. He drinks a lot of water but <64 oz per day. He's been having trouble with bedwetting recently as well. Today is one of his good days where he feels fine. Family denies significant weight change; sounds like prior value was outlier as he is consistently in the [place]     Past Medical History:  Diagnosis Date  . Anxiety   . [place]    a. 2007 in setting of VF arrest.  . CAD (coronary artery disease)    a. 2007 Ant MI/VF Arrest/PCI LAD w/ BMS;  b. 07/2015 Cath/PCI: LM nl, LAD 80 ISR (2.5x28 Synergy DES), LCX min irregs, OM1 99, RCA 50p.  . Depression   . Dysphagia, unspecified(787.20)   . GERD (gastroesophageal reflux disease)   . History of seizure disorder   . Hyperlipidemia, mixed   . Hypertensive heart disease    unspecified  . Ischemic cardiomyopathy    a. 06/2014 Echo: EF 60-65%.  . Memory loss  a. since VF arrest and anoxic brain injury in 2007.  . Nephrolithiasis   . Ventricular fibrillation ([place])    a. 2007->VF Arrest.    Past Surgical History:  Procedure Laterality Date  . bedside tracheostomy     with #6 Shiley. 04/14/2006  . CARDIAC CATHETERIZATION  09/2005   with bare metal stent to the mid LAD (Minivision 2.5 x 18 mm) with Provisional balloon angioplasty to the second diagonal.  . CARDIAC CATHETERIZATION   03/2006   with stenting of the mid LAD with Taxus 2.5 x 20 mm and 2.5 x 8 mm stents.      Marland Kitchen CARDIAC CATHETERIZATION N/A 07/20/2015   Procedure: Left Heart Cath and Coronary Angiography;  Surgeon: [name], MD;  Location: [place] CV LAB;  Service: Cardiovascular;  Laterality: N/A;  . CARDIAC CATHETERIZATION N/A 07/20/2015   Procedure: Coronary Stent Intervention;  Surgeon: [name], MD;  Location: [place] CV LAB;  Service: Cardiovascular;  Laterality: N/A;  . CORONARY STENT PLACEMENT  07/20/2015   LAD with DES  . ESOPHAGOGASTRODUODENOSCOPY    . KIDNEY STONE SURGERY    . PEG TUBE PLACEMENT    . PEG TUBE REMOVAL    . stent thrombosis  85/6314   Complicated my sudden cardiac death  . TRACHEOSTOMY CLOSURE      Current Medications: Current Meds  Medication Sig  . aspirin EC 81 MG tablet Take 81 mg by mouth daily.  . carvedilol (COREG) 25 MG tablet Take 25 mg by mouth 2 (two) times daily with a meal.    . clopidogrel (PLAVIX) 75 MG tablet Take 1 tablet (75 mg total) by mouth daily.  Marland Kitchen FLUoxetine (PROZAC) 20 MG capsule Take 20 mg by mouth at bedtime.   . fluticasone (FLONASE) 50 MCG/ACT nasal spray Place 1 spray into both nostrils daily as needed for allergies.   . furosemide (LASIX) 20 MG tablet Take 20 mg by mouth daily.   . isosorbide mononitrate (IMDUR) 30 MG 24 hr tablet TAKE 1 TABLET EVERY DAY  . loratadine (CLARITIN) 10 MG tablet Take 10 mg by mouth daily.  . nitroGLYCERIN (NITROSTAT) 0.4 MG SL tablet Place 1 tablet (0.4 mg total) under the tongue every 5 (five) minutes as needed for chest pain.  . pantoprazole (PROTONIX) 40 MG tablet Take 1 tablet (40 mg total) by mouth daily.  . pravastatin (PRAVACHOL) 20 MG tablet TAKE 1 TABLET EVERY EVENING     Allergies:   Hydromorphone hcl; Lorazepam; Pseudoephedrine; Terfenadine; and Atorvastatin   Social History   Social History  . Marital status: Married    Spouse name: N/A  . Number of children: 4  . Years  of education: 12   Occupational History  . Unemployed    Social History Main Topics  . Smoking status: Never Smoker  . Smokeless tobacco: Former Systems developer    Types: Chew    Quit date: 09/03/2005  . Alcohol use No  . Drug use: No  . Sexual activity: Not Asked   Other Topics Concern  . None   Social History Narrative   Lives at home with wife.   Right-handed.   No caffeine use.        Family History:  Family History  Problem Relation Age of Onset  . Heart attack Mother   . Heart attack Father   . Coronary artery disease Neg Hx        Siblings    ROS:   Please see the history  of present illness.  All other systems are reviewed and otherwise negative.    PHYSICAL EXAM:   VS:  BP (!) 106/50   Pulse 63   Ht _0  (1.88 m)   Wt 256 lb (116.1 kg)   SpO2 96%   BMI 32.87 kg/m   BMI: Body mass index is 32.87 kg/m. GEN: Well nourished, well developed WM, in no acute distress  HEENT: normocephalic, atraumatic Neck: no JVD, carotid bruits, or masses Cardiac: RRR; no murmurs, rubs, or gallops, no edema  Respiratory:  clear to auscultation bilaterally, normal work of breathing GI: soft, nontender, nondistended, + BS MS: no deformity or atrophy  Skin: warm and dry, no rash Neuro:  Alert and Oriented x 3 but with short term memory deficit, tangential, jovial, Strength and sensation are intact, follows commands Psych: euthymic mood, full affect  Wt Readings from Last 3 Encounters:  04/21/17 256 lb (116.1 kg)  10/01/16 234 lb 9.6 oz (106.4 kg)  11/30/15 251 lb (113.9 kg)      Studies/Labs Reviewed:   EKG:  EKG was ordered today and personally reviewed by me and demonstrates SB 57bpm, nonspecific St-T changes, no acute change  Recent Labs: No results found for requested labs within last 8760 hours.   Lipid Panel    Component Value Date/Time   CHOL 148 07/16/2009 0915   TRIG 80.0 07/16/2009 0915   HDL 40.70 07/16/2009 0915   CHOLHDL 4 07/16/2009 0915   VLDL 16.0  07/16/2009 0915   LDLCALC 91 07/16/2009 0915    Additional studies/ records that were reviewed today include: Summarized above.    ASSESSMENT & PLAN:   1. Myriad of symptoms including left sided chest/pectoral discomfort, intermittent tingling, generalized fatigue/weakness - I agree with Dr. Nelly Laurence prior assessment that history is very difficult to sort out given his tangentiality and inability to really commit to details given his prior anoxic brain injury. Will check screening labs today to r/o any obvious metabolic abnormalities. My initial thought was to stop his diuretic as perhaps his tendency towards low-normal blood pressures could be making him more fatigued, but his wife reports she stopped this a while back because he wasn't having any swelling. Could consider reducing his beta blocker, but given his prior history of CAD and persistent mixed typical/atypical cardiac symptoms, would continue current regimen but obtain f/u nuclear stress test and echocardiogram to further assess for any underlying ischemic or structural change. Warning sx reviewed with patient. 2. CAD - continue ASA, BB, Plavix. Check lipids with labs today. 3. H/o ischemic cardiomyopathy - prior EF 43% by nuc prior to stenting 07/2015. Will f/u EF by echo. 4. Hyperlipidemia - on pravastatin. No recent lipids on file. Will obtain in office today (non-fasting). If LDL remains higher than 70 would consider referral to lipid clinic to discuss PCSK9.  Disposition: Return to see Dr. Christella Noa in early follow-up after testing.   Medication Adjustments/Labs and Tests Ordered: Current medicines are reviewed at length with the patient today.  Concerns regarding medicines are outlined above. Medication changes, Labs and Tests ordered today are summarized above and listed in the Patient Instructions accessible in Encounters.   Signed, Charlie Pitter, PA-C  04/21/2017 3:32 PM    [facility]  Location in [place], [place] [address] Ph: [phone]; Fax [phone]

## 2017-04-21 ENCOUNTER — Ambulatory Visit (INDEPENDENT_AMBULATORY_CARE_PROVIDER_SITE_OTHER): Payer: 59 | Admitting: Physician Assistant

## 2017-04-21 ENCOUNTER — Encounter: Payer: Self-pay | Admitting: Physician Assistant

## 2017-04-21 ENCOUNTER — Encounter: Payer: Self-pay | Admitting: *Deleted

## 2017-04-21 ENCOUNTER — Other Ambulatory Visit (HOSPITAL_COMMUNITY)
Admission: RE | Admit: 2017-04-21 | Discharge: 2017-04-21 | Disposition: A | Discharge status: Home / Self Care | Payer: 59 | Source: Ambulatory Visit | Attending: Physician Assistant | Admitting: Physician Assistant

## 2017-04-21 VITALS — BP 106/50 | HR 63 | Ht 74.0 in | Wt 256.0 lb

## 2017-04-21 DIAGNOSIS — R0602 Shortness of breath: Secondary | ICD-10-CM | POA: Insufficient documentation

## 2017-04-21 DIAGNOSIS — I255 Ischemic cardiomyopathy: Secondary | ICD-10-CM | POA: Diagnosis not present

## 2017-04-21 DIAGNOSIS — R531 Weakness: Secondary | ICD-10-CM | POA: Insufficient documentation

## 2017-04-21 DIAGNOSIS — I2510 Atherosclerotic heart disease of native coronary artery without angina pectoris: Secondary | ICD-10-CM | POA: Diagnosis not present

## 2017-04-21 DIAGNOSIS — E785 Hyperlipidemia, unspecified: Secondary | ICD-10-CM

## 2017-04-21 DIAGNOSIS — R079 Chest pain, unspecified: Secondary | ICD-10-CM

## 2017-04-21 DIAGNOSIS — R0789 Other chest pain: Secondary | ICD-10-CM | POA: Insufficient documentation

## 2017-04-21 LAB — CBC WITH DIFFERENTIAL/PLATELET
BASOS ABS: 0 10*3/uL (ref 0.0–0.1)
BASOS PCT: 0 %
Eosinophils Absolute: 0.2 10*3/uL (ref 0.0–0.7)
Eosinophils Relative: 3 %
HEMATOCRIT: 44.5 % (ref 39.0–52.0)
HEMOGLOBIN: 14.7 g/dL (ref 13.0–17.0)
LYMPHS ABS: 2.3 10*3/uL (ref 0.7–4.0)
LYMPHS PCT: 28 %
MCH: 28.8 pg (ref 26.0–34.0)
MCHC: 33 g/dL (ref 30.0–36.0)
MCV: 87.3 fL (ref 78.0–100.0)
Monocytes Absolute: 0.6 10*3/uL (ref 0.1–1.0)
Monocytes Relative: 8 %
NEUTROS ABS: 5 10*3/uL (ref 1.7–7.7)
Neutrophils Relative %: 61 %
Platelets: 245 10*3/uL (ref 150–400)
RBC: 5.1 MIL/uL (ref 4.22–5.81)
RDW: 12.7 % (ref 11.5–15.5)
WBC: 8.2 10*3/uL (ref 4.0–10.5)

## 2017-04-21 LAB — LIPID PANEL
CHOLESTEROL: 197 mg/dL (ref 0–200)
HDL: 41 mg/dL (ref >40)
LDL Cholesterol: 84 mg/dL (ref 0–99)
TRIGLYCERIDES: 359 mg/dL — AB (ref <150)
Total CHOL/HDL Ratio: 4.8 RATIO
VLDL: 72 mg/dL — AB (ref 0–40)

## 2017-04-21 LAB — COMPREHENSIVE METABOLIC PANEL
ALT: 17 U/L (ref 17–63)
AST: 15 U/L (ref 15–41)
Albumin: 4.3 g/dL (ref 3.5–5.0)
Alkaline Phosphatase: 122 U/L (ref 38–126)
Anion gap: 8 (ref 5–15)
BILIRUBIN TOTAL: 0.3 mg/dL (ref 0.3–1.2)
BUN: 14 mg/dL (ref 6–20)
CHLORIDE: 104 mmol/L (ref 101–111)
CO2: 29 mmol/L (ref 22–32)
CREATININE: 1.16 mg/dL (ref 0.61–1.24)
Calcium: 9 mg/dL (ref 8.9–10.3)
Glucose, Bld: 86 mg/dL (ref 65–99)
POTASSIUM: 4.2 mmol/L (ref 3.5–5.1)
Sodium: 141 mmol/L (ref 135–145)
TOTAL PROTEIN: 7 g/dL (ref 6.5–8.1)

## 2017-04-21 LAB — TSH: TSH: 1.907 u[IU]/mL (ref 0.350–4.500)

## 2017-04-21 NOTE — Patient Instructions (Addendum)
Medication Instructions:  Your physician recommends that you continue on your current medications as directed. Please refer to the Current Medication list given to you today.   Labwork: Your physician recommends that you return for lab work in: Today    Testing/Procedures: Your physician has requested that you have a lexiscan myoview. For further information please visit [email or web address]. Please follow instruction sheet, as given.  Your physician has requested that you have an echocardiogram. Echocardiography is a painless test that uses sound waves to create images of your heart. It provides your doctor with information about the size and shape of your heart and how well your heart's chambers and valves are working. This procedure takes approximately one hour. There are no restrictions for this procedure.    Follow-Up: Your physician recommends that you schedule a follow-up appointment after test.    Any Other Special Instructions Will Be Listed Below (If Applicable).     If you need a refill on your cardiac medications before your next appointment, please call your pharmacy.  Thank you for choosing [facility]!

## 2017-04-28 ENCOUNTER — Telehealth: Payer: Self-pay | Admitting: *Deleted

## 2017-04-28 NOTE — Telephone Encounter (Signed)
-----   Message from Laurann Montanaayna N Dunn, [place] sent at 04/23/2017  7:48 AM EDT ----- Please let patient know labs were stable except LDL remains slightly above goal of 70. With recent weakness I would hold steady on present med regimen but make sure following a heart healthy diet. Further statin adjustment decisions can come from primary cardiologist once acute issues sorted out. Dayna Dunn PA-C

## 2017-04-28 NOTE — Telephone Encounter (Signed)
Pt's wife returned call--can be reached @ [phone]

## 2017-04-28 NOTE — Telephone Encounter (Signed)
Called patient with test results. No answer. Left message to call back.  

## 2017-04-28 NOTE — Telephone Encounter (Signed)
error 

## 2017-04-28 NOTE — Telephone Encounter (Signed)
Wife given test results

## 2017-04-30 ENCOUNTER — Encounter (HOSPITAL_COMMUNITY): Payer: 59

## 2017-04-30 ENCOUNTER — Other Ambulatory Visit (HOSPITAL_COMMUNITY): Payer: 59

## 2017-05-01 ENCOUNTER — Encounter (HOSPITAL_COMMUNITY): Payer: Self-pay

## 2017-05-01 ENCOUNTER — Encounter (HOSPITAL_BASED_OUTPATIENT_CLINIC_OR_DEPARTMENT_OTHER)
Admission: RE | Admit: 2017-05-01 | Discharge: 2017-05-01 | Disposition: A | Discharge status: Home / Self Care | Payer: 59 | Source: Ambulatory Visit | Attending: Physician Assistant | Admitting: Physician Assistant

## 2017-05-01 ENCOUNTER — Encounter (HOSPITAL_COMMUNITY)
Admission: RE | Admit: 2017-05-01 | Discharge: 2017-05-01 | Disposition: A | Discharge status: Home / Self Care | Payer: 59 | Source: Ambulatory Visit | Attending: Physician Assistant | Admitting: Physician Assistant

## 2017-05-01 ENCOUNTER — Ambulatory Visit (HOSPITAL_COMMUNITY)
Admission: RE | Admit: 2017-05-01 | Discharge: 2017-05-01 | Disposition: A | Discharge status: Home / Self Care | Payer: 59 | Source: Ambulatory Visit | Attending: Physician Assistant | Admitting: Physician Assistant

## 2017-05-01 DIAGNOSIS — R531 Weakness: Secondary | ICD-10-CM | POA: Diagnosis not present

## 2017-05-01 DIAGNOSIS — R079 Chest pain, unspecified: Secondary | ICD-10-CM

## 2017-05-01 DIAGNOSIS — R0789 Other chest pain: Secondary | ICD-10-CM | POA: Diagnosis not present

## 2017-05-01 DIAGNOSIS — R9439 Abnormal result of other cardiovascular function study: Secondary | ICD-10-CM | POA: Diagnosis not present

## 2017-05-01 DIAGNOSIS — I255 Ischemic cardiomyopathy: Secondary | ICD-10-CM

## 2017-05-01 DIAGNOSIS — I119 Hypertensive heart disease without heart failure: Secondary | ICD-10-CM | POA: Insufficient documentation

## 2017-05-01 DIAGNOSIS — R0602 Shortness of breath: Secondary | ICD-10-CM

## 2017-05-01 DIAGNOSIS — I252 Old myocardial infarction: Secondary | ICD-10-CM | POA: Insufficient documentation

## 2017-05-01 DIAGNOSIS — I2510 Atherosclerotic heart disease of native coronary artery without angina pectoris: Secondary | ICD-10-CM

## 2017-05-01 DIAGNOSIS — E785 Hyperlipidemia, unspecified: Secondary | ICD-10-CM

## 2017-05-01 LAB — ECHOCARDIOGRAM COMPLETE
E/e' ratio: 5.42
EWDT: 201 ms
FS: 17 % — AB (ref 28–44)
IVS/LV PW RATIO, ED: 1.16
LA diam end sys: 33 mm
LADIAMINDEX: 1.37 cm/m2
LASIZE: 33 mm
LAVOLA4C: 67.2 mL
LV E/e' medial: 5.42
LV E/e'average: 5.42
LV PW d: 10.2 mm — AB (ref 0.6–1.1)
LV TDI E'LATERAL: 10.2
LV TDI E'MEDIAL: 5.98
LV e' LATERAL: 10.2 cm/s
LVOT area: 3.46 cm2
LVOTD: 21 mm
Lateral S' vel: 12 cm/s
MV Dec: 201
MV pk A vel: 59.2 m/s
MVPKEVEL: 55.3 m/s
TAPSE: 23.1 mm

## 2017-05-01 LAB — NM MYOCAR MULTI W/SPECT W/WALL MOTION / EF
LHR: 0.45
LVDIAVOL: 137 mL (ref 62–150)
LVSYSVOL: 91 mL
NUC STRESS TID: 1.28
Peak HR: 87 {beats}/min
Rest HR: 53 {beats}/min
SDS: 5
SRS: 6
SSS: 11

## 2017-05-01 MED ORDER — TECHNETIUM TC 99M TETROFOSMIN IV KIT
10.0000 | PACK | Freq: Once | INTRAVENOUS | Status: AC | PRN
  Administered 2017-05-01: 10.4 via INTRAVENOUS

## 2017-05-01 MED ORDER — SODIUM CHLORIDE 0.9% FLUSH
INTRAVENOUS | Status: AC
  Administered 2017-05-01: 10 mL via INTRAVENOUS
  Filled 2017-05-01: qty 10

## 2017-05-01 MED ORDER — REGADENOSON 0.4 MG/5ML IV SOLN
INTRAVENOUS | Status: AC
  Administered 2017-05-01: 0.4 mg via INTRAVENOUS
  Filled 2017-05-01: qty 5

## 2017-05-01 MED ORDER — TECHNETIUM TC 99M TETROFOSMIN IV KIT
30.0000 | PACK | Freq: Once | INTRAVENOUS | Status: AC | PRN
  Administered 2017-05-01: 31 via INTRAVENOUS

## 2017-05-01 NOTE — Progress Notes (Signed)
*  PRELIMINARY RESULTS* Echocardiogram ECHO has been performed.  Jeryl Columbialliott, Mariska Daffin 05/01/2017, 9:16 AM

## 2017-05-05 ENCOUNTER — Ambulatory Visit: Payer: 59 | Admitting: Adult Health

## 2017-05-05 ENCOUNTER — Telehealth: Payer: Self-pay

## 2017-05-05 NOTE — Telephone Encounter (Signed)
Called pt., no answer. Left message for pt to return call.  

## 2017-05-05 NOTE — Telephone Encounter (Signed)
-----   Message from Laurann Montanaayna N Dunn, [place] sent at 05/05/2017  4:05 PM EST ----- D/w Dr. Wyline MoodBranch; [facility]. Can you let patient/family know stress test showed possible changes in the blood flow to his heart arteries and that Dr. Wyline MoodBranch will be reviewing and will discuss next steps in visit tomorrow? Thanks. Dayna Dunn PA-C

## 2017-05-06 ENCOUNTER — Ambulatory Visit (INDEPENDENT_AMBULATORY_CARE_PROVIDER_SITE_OTHER): Payer: 59 | Admitting: Cardiology

## 2017-05-06 ENCOUNTER — Encounter: Payer: Self-pay | Admitting: Cardiology

## 2017-05-06 VITALS — BP 118/70 | HR 54 | Ht 75.0 in | Wt 259.0 lb

## 2017-05-06 DIAGNOSIS — R0789 Other chest pain: Secondary | ICD-10-CM | POA: Diagnosis not present

## 2017-05-06 DIAGNOSIS — Z01818 Encounter for other preprocedural examination: Secondary | ICD-10-CM

## 2017-05-06 DIAGNOSIS — I2510 Atherosclerotic heart disease of native coronary artery without angina pectoris: Secondary | ICD-10-CM

## 2017-05-06 NOTE — Progress Notes (Signed)
Clinical Summary [name] Cummings is a 56 y.o.male seen today for follow up of the following medical problems. This is a focused visit on his history of CAD and recent chest pain.   1. CAD - hx of BMS to LAD in 2007, followed by stent thrombosis with resulting VF arrest. LAD was restented at that time. - cath 2010 LAD 50% prox, 30% ISR in mid LAD, distal LAD 70-80%, LCX 40% ostial, 70-80% narrowing proximal portion LCX in the ramus Penni Penado, RCA 70-80% proximal. LVEF 45%, the distal LAD was stented.  - cath Jan 2017 severe ISR of LAD stent, received another DES. Small OM1 subtotally occluded with left to left collaterals - echo Jan 2016 LVEF 60-65% - seen in [facility] 08/2015 with atypical chest pain, no evidence of ACS. Imdur was added.     04/2017 nuclear stress: anterolateral infarct with mild peri-infarct ischemia. Large lateral/inferolateral/inferior/inferioapical defect with moderate ischemia. LVEF 30-44% by nuclear.  - 04/2017 echo LVEF 50-55%, multiple WMAs - SOB with walking  exertion. Left sided chest pain.   2. 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 Patient is not able to give description of symptoms, symptoms are all relayed through his wife.      Past Medical History:  Diagnosis Date  . Anxiety   . 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  . CAD (coronary artery disease)    a. 2007 Ant MI/VF Arrest/PCI LAD w/ BMS;  b. 07/2015 Cath/PCI: LM nl, LAD 80 ISR (2.5x28 Synergy DES), LCX min irregs, OM1 99, RCA 50p.  . Depression   . Dysphagia, unspecified(787.20)   . GERD (gastroesophageal reflux disease)   . History of seizure disorder   . Hyperlipidemia, mixed   . Hypertensive heart disease    unspecified  . Ischemic cardiomyopathy    a. 06/2014 Echo: EF 60-65%.  . 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  . Nephrolithiasis   . 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       Allergies  Allergen Reactions  . Hydromorphone Hcl     REACTION: Reaction not known  . Lorazepam     REACTION: Reaction not known  . Pseudoephedrine     REACTION: Reaction not known  . Terfenadine     REACTION: Reaction not known  . Atorvastatin     Pt c/o myalgias     Current Outpatient Medications  Medication Sig Dispense Refill  . aspirin EC 81 MG tablet Take 81 mg by mouth daily.    . carvedilol (COREG) 25 MG tablet Take 25 mg by mouth 2 (two) times daily with a meal.      . clopidogrel (PLAVIX) 75 MG tablet Take 1 tablet (75 mg total) by mouth daily. 30 tablet 6  . FLUoxetine (PROZAC) 20 MG capsule Take 20 mg by mouth at bedtime.     . fluticasone (FLONASE) 50 MCG/ACT nasal spray Place 1 spray into both nostrils daily as needed for allergies.     . isosorbide mononitrate (IMDUR) 30 MG 24 hr tablet TAKE 1 TABLET EVERY DAY 90 tablet 3  . loratadine (CLARITIN) 10 MG tablet Take 10 mg by mouth daily.    . nitroGLYCERIN (NITROSTAT) 0.4 MG SL tablet Place 1 tablet (0.4 mg total) under the tongue every 5 (five) minutes as needed for chest pain. 25 tablet 3  . pantoprazole (PROTONIX)  40 MG tablet Take 1 tablet (40 mg total) by mouth daily. 30 tablet 6  . pravastatin (PRAVACHOL) 20 MG tablet TAKE 1 TABLET EVERY EVENING 90 tablet 3   No current facility-administered medications for this visit.      Past Surgical History:  Procedure Laterality Date  . bedside tracheostomy     with #6 Shiley. 04/14/2006  . CARDIAC CATHETERIZATION  09/2005   with bare metal stent to the mid LAD (Minivision 2.5 x 18 mm) with Provisional balloon angioplasty to the second diagonal.  . CARDIAC CATHETERIZATION  03/2006   with stenting of the mid LAD with Taxus 2.5 x 20 mm and 2.5 x 8 mm stents.      . CORONARY STENT PLACEMENT  07/20/2015   LAD with DES  . ESOPHAGOGASTRODUODENOSCOPY    . KIDNEY STONE SURGERY    . PEG TUBE PLACEMENT    . PEG TUBE REMOVAL    . stent thrombosis  03/2006    Complicated my sudden cardiac death  . TRACHEOSTOMY CLOSURE       Allergies  Allergen Reactions  . Hydromorphone Hcl     REACTION: Reaction not known  . Lorazepam     REACTION: Reaction not known  . Pseudoephedrine     REACTION: Reaction not known  . Terfenadine     REACTION: Reaction not known  . Atorvastatin     Pt c/o myalgias      Family History  Problem Relation Age of Onset  . Heart attack Mother   . Heart attack Father   . Coronary artery disease Neg Hx        Siblings     Social History [name] Cummings reports that  has never smoked. He quit smokeless tobacco use about 11 years ago. His smokeless tobacco use included chew. [name] Cummings reports that he does not drink alcohol.   Review of Systems CONSTITUTIONAL: No weight loss, fever, chills, weakness or fatigue.  HEENT: Eyes: No visual loss, blurred vision, double vision or yellow sclerae.No hearing loss, sneezing, congestion, runny nose or sore throat.  SKIN: No rash or itching.  CARDIOVASCULAR: per hpi RESPIRATORY: per hpi GASTROINTESTINAL: No anorexia, nausea, vomiting or diarrhea. No abdominal pain or blood.  GENITOURINARY: No burning on urination, no polyuria NEUROLOGICAL: No headache, dizziness, syncope, paralysis, ataxia, numbness or tingling in the extremities. No change in bowel or bladder control.  MUSCULOSKELETAL: No muscle, back pain, joint pain or stiffness.  LYMPHATICS: No enlarged nodes. No history of splenectomy.  PSYCHIATRIC: No history of depression or anxiety.  ENDOCRINOLOGIC: No reports of sweating, cold or heat intolerance. No polyuria or polydipsia.  Marland Kitchen.   Physical Examination Vitals:   05/06/17 1540  BP: 118/70  Pulse: (!) 54  SpO2: 98%   Vitals:   05/06/17 1540  Weight: 259 lb (117.5 kg)  Height: 6\' 3"  (1.905 m)    Gen: resting comfortably, no acute distress HEENT: no scleral icterus, pupils equal round and reactive, no palptable cervical adenopathy,  CV: RRR, no m/r/g, no  jvd Resp: Clear to auscultation bilaterally GI: abdomen is soft, non-tender, non-distended, normal bowel sounds, no hepatosplenomegaly MSK: extremities are warm, no edema.  Skin: warm, no rash Neuro:  no focal deficits Psych: appropriate affect   Diagnostic Studies 11/2008 Cath RESULTS: Left main coronary artery: The left main coronary artery was free of significant disease.  Left anterior descending: The left anterior descending artery gave rise to small diagonal Quintasia Theroux and small septal perforator, and larger diagonal Tiger Spieker.  There was 30% narrowing within the midportion of the previously placed stents. Distal to the stent, there was an eccentric lesion, which appeared to be a ruptured plaque, which was estimated to be 70-80% obstructive. There also was 40-50% diffuse narrowing in the proximal LAD.  Circumflex artery: The circumflex artery gave rise to a large ramus Marely Apgar and two posterolateral branches and a posterior descending Deante Blough. This was a dominant vessel. There was 70-80% narrowing in the proximal portion in the ramus Kaylah Chiasson which was extended over about 18 mm. There was also a 40% ostial narrowing in the circumflex artery and 50% narrowing in the ostium of the ramus Kashden Deboy.  Right coronary artery: The right coronary artery was a nondominant vessel which supplied only right ventricular branches. There was a 70- 80% proximal stenosis.  Left ventriculogram: The left ventriculogram was performed in the RAO projection showed hypokinesis of the anterolateral wall and apex. The estimated fraction was 45%.  Following stenting of the lesion in the mid LAD, the stenosis improved from 80% to 0%.  CONCLUSIONS: 1. Coronary artery disease, status post previous percutaneous coronary  interventions as described above with 40% narrowing in the proximal  LAD, 30% narrowing within the stent in the mid LAD, and 80%  narrowing  in the mid LAD after the stent, 70-80% narrowing in the  large ramus Wahid Holley of the circumflex artery with 40% ostial  stenosis in the dominant circumflex artery, and 80% narrowing in  the proximal portion of a nondominant right coronary artery with  anterolateral wall and apical wall hypokinesis and estimated  ejection fraction of 45%. 2. Successful PCI of the lesion in the mid LAD distal to the  previously placed stent using a XIENCE drug-eluting stent with  improvement in center narrowing from 80% to 0%.  DISPOSITION: The patient [facility] to [facility] room for further observation.    Jan 2016 echo Study Conclusions  - Left ventricle: The cavity size was normal. Wall thickness was normal. Systolic function was normal. The estimated ejection fraction was in the range of 60% to 65%. Indeterminant diastolic function. - Aortic valve: Mildly calcified annulus. Mildly thickened leaflets. - Mitral valve: Mildly calcified annulus. Mildly thickened leaflets . - Left atrium: The atrium was mildly dilated. - Systemic veins: IVC is small, suggestive of low RA pressure and hypovolemia. - Technically adequate study.   07/10/15 Clinic EKG (performed and reviewed in clinic): Sinus bradycardia  Jan 2017 Lexiscan MPI  Perfusion Summary Defect 1:  There is a large defect of moderate severity present in the basal anterolateral, mid anterolateral and apical lateral location. The defect is partially reversible. Large, moderate intensity, partially reversible anterolateral defect consistent with scar and at least moderate peri-infarct ischemia.    Overall Study Impression Myocardial perfusion is abnormal. This is a high risk study. Overall left ventricular systolic function was abnormal. Nuclear stress EF: 43%.      Jan 2017 cath  OM1 subtotally occluded with left to left collaterals coming from the apical LAD.  Severe in-stent  restenosis of prior bare-metal stent which was placed in 2007 in the setting of his cardiac arrest. This was treated with cutting balloon angioplasty anddrug-eluting stent placement with a 2.5 x 28 Synergy drug-eluting stent, postdilatto greater than 3 m  Continue dual antiplatelet therapy indefinitely. The patient has significant memory loss after his cardiac arrest. Will discuss with his wife about the importance of staying on his dual antiplatelet therapy.   He'll be watched overnight with anticipated discharge tomorrow  04/2017 Nuclear stress  There was no ST segment deviation noted during stress.  Findings consistent with prior anterolateral myocardial infarction with mild peri-infarct ischemia. Large lateral/inferolatera/inferior/inferoapical defect with moderate ischemia  This is a high risk study. High risk due to low ejection fraction and multiple areas of ischemia. Mild area of ischemia anterolateral. Moderate area of ischemia lateral/inferior walls  The left ventricular ejection fraction is moderately decreased (30-44%).   Assessment and Plan  1. CAD - difficult to obtain history due to patients chronic severe short term memory deficit. Based on what he and his wife report he has had recent SOB and chest pain. Recent abnormal stress test. Extensive prior CAD history - we will plan for repeat cath    I have reviewed the risks, indications, and alternatives to cardiac catheterization, possible angioplasty, and stenting with the patient and his wife today. Risks include but are not limited to bleeding, infection, vascular injury, stroke, myocardial infection, arrhythmia, kidney injury, radiation-related injury in the case of prolonged fluoroscopy use, emergency cardiac surgery, and death. The patient understands the risks of serious complication is 1-2 in 1000 with diagnostic cardiac cath and 1-2% or less with angioplasty/stenting.   [name] Cummings, M.D.

## 2017-05-06 NOTE — Patient Instructions (Signed)
Medication Instructions:  Your physician recommends that you continue on your current medications as directed. Please refer to the Current Medication list given to you today.   Labwork: Monday BMET CBC INR   Testing/Procedures: A chest x-ray takes a picture of the organs and structures inside the chest, including the heart, lungs, and blood vessels. This test can show several things, including, whether the heart is enlarges; whether fluid is building up in the lungs; and whether pacemaker / defibrillator leads are still in place.   Follow-Up: Your physician recommends that you schedule a follow-up appointment in: 1 MONTH    Any Other Special Instructions Will Be Listed Below (If Applicable).     If you need a refill on your cardiac medications before your next appointment, please call your pharmacy.   [facility] [address] [place] [address] Dept: [phone] Loc: [phone]  [name] PumaJesse D Cummings  05/06/2017  You are scheduled for a Cardiac Catheterization on Friday, November 16 with Dr. Tonny BollmanMichael Cooper.  1. [facility]: [address] [place], [place] [address] at 5:30 AM (two hours before your procedure to ensure your preparation). Free valet parking service is available.   Special note: Every effort is made to have your procedure done on time. Please understand that emergencies sometimes delay scheduled procedures.  2. Diet: Do not eat or drink anything after midnight prior to your procedure except sips of water to take medications.  3. Labs: [facility] HawkingNN[facility]ENN on Monday November 12th   4. Medication instructions in preparation for your procedure:    On the morning of your procedure, take your Aspirin and any morning medicines NOT listed above.  You may use sips of water.  5. Plan for one night stay--bring personal belongings. 6. Bring  a current list of your medications and current insurance cards. 7. You MUST have a responsible person to drive you home. 8. Someone MUST be with you the first 24 hours after you arrive home or your discharge will be delayed. 9. Please wear clothes that are easy to get on and off and wear slip-on shoes.  Thank you for allowing us to care for you!   -- [place]

## 2017-05-06 NOTE — H&P (View-Only) (Signed)
   Clinical Summary [name] Cummings is a 56 y.o.male seen today for follow up of the following medical problems. This is a focused visit on his history of CAD and recent chest pain.   1. CAD - hx of BMS to LAD in 2007, followed by stent thrombosis with resulting VF arrest. LAD was restented at that time. - cath 2010 LAD 50% prox, 30% ISR in mid LAD, distal LAD 70-80%, LCX 40% ostial, 70-80% narrowing proximal portion LCX in the ramus [name] Cummings, RCA 70-80% proximal. LVEF 45%, the distal LAD was stented.  - cath Jan 2017 severe ISR of LAD stent, received another DES. Small OM1 subtotally occluded with left to left collaterals - echo Jan 2016 LVEF 60-65% - seen in [facility] 08/2015 with atypical chest pain, no evidence of ACS. Imdur was added.     04/2017 nuclear stress: anterolateral infarct with mild peri-infarct ischemia. Large lateral/inferolateral/inferior/inferioapical defect with moderate ischemia. LVEF 30-44% by nuclear.  - 04/2017 echo LVEF 50-55%, multiple WMAs - SOB with walking  exertion. Left sided chest pain.   2. 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 Patient is not able to give description of symptoms, symptoms are all relayed through his wife.      Past Medical History:  Diagnosis Date  . Anxiety   . 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  . CAD (coronary artery disease)    a. 2007 Ant MI/VF Arrest/PCI LAD w/ BMS;  b. 07/2015 Cath/PCI: LM nl, LAD 80 ISR (2.5x28 Synergy DES), LCX min irregs, OM1 99, RCA 50p.  . Depression   . Dysphagia, unspecified(787.20)   . GERD (gastroesophageal reflux disease)   . History of seizure disorder   . Hyperlipidemia, mixed   . Hypertensive heart disease    unspecified  . Ischemic cardiomyopathy    a. 06/2014 Echo: EF 60-65%.  . 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  . Nephrolithiasis   . 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       Allergies  Allergen Reactions  . Hydromorphone Hcl     REACTION: Reaction not known  . Lorazepam     REACTION: Reaction not known  . Pseudoephedrine     REACTION: Reaction not known  . Terfenadine     REACTION: Reaction not known  . Atorvastatin     Pt c/o myalgias     Current Outpatient Medications  Medication Sig Dispense Refill  . aspirin EC 81 MG tablet Take 81 mg by mouth daily.    . carvedilol (COREG) 25 MG tablet Take 25 mg by mouth 2 (two) times daily with a meal.      . clopidogrel (PLAVIX) 75 MG tablet Take 1 tablet (75 mg total) by mouth daily. 30 tablet 6  . FLUoxetine (PROZAC) 20 MG capsule Take 20 mg by mouth at bedtime.     . fluticasone (FLONASE) 50 MCG/ACT nasal spray Place 1 spray into both nostrils daily as needed for allergies.     . isosorbide mononitrate (IMDUR) 30 MG 24 hr tablet TAKE 1 TABLET EVERY DAY 90 tablet 3  . loratadine (CLARITIN) 10 MG tablet Take 10 mg by mouth daily.    . nitroGLYCERIN (NITROSTAT) 0.4 MG SL tablet Place 1 tablet (0.4 mg total) under the tongue every 5 (five) minutes as needed for chest pain. 25 tablet 3  . pantoprazole (PROTONIX)   40 MG tablet Take 1 tablet (40 mg total) by mouth daily. 30 tablet 6  . pravastatin (PRAVACHOL) 20 MG tablet TAKE 1 TABLET EVERY EVENING 90 tablet 3   No current facility-administered medications for this visit.      Past Surgical History:  Procedure Laterality Date  . bedside tracheostomy     with #6 Shiley. 04/14/2006  . CARDIAC CATHETERIZATION  09/2005   with bare metal stent to the mid LAD (Minivision 2.5 x 18 mm) with Provisional balloon angioplasty to the second diagonal.  . CARDIAC CATHETERIZATION  03/2006   with stenting of the mid LAD with Taxus 2.5 x 20 mm and 2.5 x 8 mm stents.      . CORONARY STENT PLACEMENT  07/20/2015   LAD with DES  . ESOPHAGOGASTRODUODENOSCOPY    . KIDNEY STONE SURGERY    . PEG TUBE PLACEMENT    . PEG TUBE REMOVAL    . stent thrombosis  03/2006    Complicated my sudden cardiac death  . TRACHEOSTOMY CLOSURE       Allergies  Allergen Reactions  . Hydromorphone Hcl     REACTION: Reaction not known  . Lorazepam     REACTION: Reaction not known  . Pseudoephedrine     REACTION: Reaction not known  . Terfenadine     REACTION: Reaction not known  . Atorvastatin     Pt c/o myalgias      Family History  Problem Relation Age of Onset  . Heart attack Mother   . Heart attack Father   . Coronary artery disease Neg Hx        Siblings     Social History Mr. Halberstadt reports that  has never smoked. He quit smokeless tobacco use about 11 years ago. His smokeless tobacco use included chew. Mr. Hai reports that he does not drink alcohol.   Review of Systems CONSTITUTIONAL: No weight loss, fever, chills, weakness or fatigue.  HEENT: Eyes: No visual loss, blurred vision, double vision or yellow sclerae.No hearing loss, sneezing, congestion, runny nose or sore throat.  SKIN: No rash or itching.  CARDIOVASCULAR: per hpi RESPIRATORY: per hpi GASTROINTESTINAL: No anorexia, nausea, vomiting or diarrhea. No abdominal pain or blood.  GENITOURINARY: No burning on urination, no polyuria NEUROLOGICAL: No headache, dizziness, syncope, paralysis, ataxia, numbness or tingling in the extremities. No change in bowel or bladder control.  MUSCULOSKELETAL: No muscle, back pain, joint pain or stiffness.  LYMPHATICS: No enlarged nodes. No history of splenectomy.  PSYCHIATRIC: No history of depression or anxiety.  ENDOCRINOLOGIC: No reports of sweating, cold or heat intolerance. No polyuria or polydipsia.  .   Physical Examination Vitals:   05/06/17 1540  BP: 118/70  Pulse: (!) 54  SpO2: 98%   Vitals:   05/06/17 1540  Weight: 259 lb (117.5 kg)  Height: 6' 3" (1.905 m)    Gen: resting comfortably, no acute distress HEENT: no scleral icterus, pupils equal round and reactive, no palptable cervical adenopathy,  CV: RRR, no m/r/g, no  jvd Resp: Clear to auscultation bilaterally GI: abdomen is soft, non-tender, non-distended, normal bowel sounds, no hepatosplenomegaly MSK: extremities are warm, no edema.  Skin: warm, no rash Neuro:  no focal deficits Psych: appropriate affect   Diagnostic Studies 11/2008 Cath RESULTS: Left main coronary artery: The left main coronary artery was free of significant disease.  Left anterior descending: The left anterior descending artery gave rise to small diagonal [name] Cummings and small septal perforator, and larger diagonal [name] Cummings.   There was 30% narrowing within the midportion of the previously placed stents. Distal to the stent, there was an eccentric lesion, which appeared to be a ruptured plaque, which was estimated to be 70-80% obstructive. There also was 40-50% diffuse narrowing in the proximal LAD.  Circumflex artery: The circumflex artery gave rise to a large ramus [name] Cummings and two posterolateral branches and a posterior descending [name] Cummings. This was a dominant vessel. There was 70-80% narrowing in the proximal portion in the ramus [name] Cummings which was extended over about 18 mm. There was also a 40% ostial narrowing in the circumflex artery and 50% narrowing in the ostium of the ramus [name] Cummings.  Right coronary artery: The right coronary artery was a nondominant vessel which supplied only right ventricular branches. There was a 70- 80% proximal stenosis.  Left ventriculogram: The left ventriculogram was performed in the RAO projection showed hypokinesis of the anterolateral wall and apex. The estimated fraction was 45%.  Following stenting of the lesion in the mid LAD, the stenosis improved from 80% to 0%.  CONCLUSIONS: 1. Coronary artery disease, status post previous percutaneous coronary  interventions as described above with 40% narrowing in the proximal  LAD, 30% narrowing within the stent in the mid LAD, and 80%  narrowing  in the mid LAD after the stent, 70-80% narrowing in the  large ramus [name] Cummings of the circumflex artery with 40% ostial  stenosis in the dominant circumflex artery, and 80% narrowing in  the proximal portion of a nondominant right coronary artery with  anterolateral wall and apical wall hypokinesis and estimated  ejection fraction of 45%. 2. Successful PCI of the lesion in the mid LAD distal to the  previously placed stent using a XIENCE drug-eluting stent with  improvement in center narrowing from 80% to 0%.  DISPOSITION: The patient returned to post-angio room for further observation.    Jan 2016 echo Study Conclusions  - Left ventricle: The cavity size was normal. Wall thickness was normal. Systolic function was normal. The estimated ejection fraction was in the range of 60% to 65%. Indeterminant diastolic function. - Aortic valve: Mildly calcified annulus. Mildly thickened leaflets. - Mitral valve: Mildly calcified annulus. Mildly thickened leaflets . - Left atrium: The atrium was mildly dilated. - Systemic veins: IVC is small, suggestive of low RA pressure and hypovolemia. - Technically adequate study.   07/10/15 Clinic EKG (performed and reviewed in clinic): Sinus bradycardia  Jan 2017 Lexiscan MPI  Perfusion Summary Defect 1:  There is a large defect of moderate severity present in the basal anterolateral, mid anterolateral and apical lateral location. The defect is partially reversible. Large, moderate intensity, partially reversible anterolateral defect consistent with scar and at least moderate peri-infarct ischemia.    Overall Study Impression Myocardial perfusion is abnormal. This is a high risk study. Overall left ventricular systolic function was abnormal. Nuclear stress EF: 43%.      Jan 2017 cath  OM1 subtotally occluded with left to left collaterals coming from the apical LAD.  Severe in-stent  restenosis of prior bare-metal stent which was placed in 2007 in the setting of his cardiac arrest. This was treated with cutting balloon angioplasty anddrug-eluting stent placement with a 2.5 x 28 Synergy drug-eluting stent, postdilatto greater than 3 m  Continue dual antiplatelet therapy indefinitely. The patient has significant memory loss after his cardiac arrest. Will discuss with his wife about the importance of staying on his dual antiplatelet therapy.   He'll be watched overnight with anticipated discharge tomorrow    04/2017 Nuclear stress  There was no ST segment deviation noted during stress.  Findings consistent with prior anterolateral myocardial infarction with mild peri-infarct ischemia. Large lateral/inferolatera/inferior/inferoapical defect with moderate ischemia  This is a high risk study. High risk due to low ejection fraction and multiple areas of ischemia. Mild area of ischemia anterolateral. Moderate area of ischemia lateral/inferior walls  The left ventricular ejection fraction is moderately decreased (30-44%).   Assessment and Plan  1. CAD - difficult to obtain history due to patients chronic severe short term memory deficit. Based on what he and his wife report he has had recent SOB and chest pain. Recent abnormal stress test. Extensive prior CAD history - we will plan for repeat cath    I have reviewed the risks, indications, and alternatives to cardiac catheterization, possible angioplasty, and stenting with the patient and his wife today. Risks include but are not limited to bleeding, infection, vascular injury, stroke, myocardial infection, arrhythmia, kidney injury, radiation-related injury in the case of prolonged fluoroscopy use, emergency cardiac surgery, and death. The patient understands the risks of serious complication is 1-2 in 1000 with diagnostic cardiac cath and 1-2% or less with angioplasty/stenting.   Tyeshia Cornforth F. Luca Dyar, M.D. 

## 2017-05-08 ENCOUNTER — Encounter: Payer: Self-pay | Admitting: Cardiology

## 2017-05-11 ENCOUNTER — Other Ambulatory Visit (HOSPITAL_COMMUNITY)
Admission: RE | Admit: 2017-05-11 | Discharge: 2017-05-11 | Disposition: A | Discharge status: Home / Self Care | Payer: 59 | Source: Ambulatory Visit | Attending: Cardiology | Admitting: Cardiology

## 2017-05-11 ENCOUNTER — Ambulatory Visit (HOSPITAL_COMMUNITY)
Admission: RE | Admit: 2017-05-11 | Discharge: 2017-05-11 | Disposition: A | Discharge status: Home / Self Care | Payer: 59 | Source: Ambulatory Visit | Attending: Cardiology | Admitting: Cardiology

## 2017-05-11 ENCOUNTER — Other Ambulatory Visit: Payer: Self-pay | Admitting: Cardiology

## 2017-05-11 DIAGNOSIS — Z01818 Encounter for other preprocedural examination: Secondary | ICD-10-CM

## 2017-05-11 DIAGNOSIS — I2510 Atherosclerotic heart disease of native coronary artery without angina pectoris: Secondary | ICD-10-CM | POA: Diagnosis not present

## 2017-05-11 LAB — CBC WITH DIFFERENTIAL/PLATELET
BASOS ABS: 0 10*3/uL (ref 0.0–0.1)
Basophils Relative: 0 %
EOS PCT: 3 %
Eosinophils Absolute: 0.2 10*3/uL (ref 0.0–0.7)
HEMATOCRIT: 43.6 % (ref 39.0–52.0)
Hemoglobin: 14.5 g/dL (ref 13.0–17.0)
LYMPHS PCT: 31 %
Lymphs Abs: 1.6 10*3/uL (ref 0.7–4.0)
MCH: 28.9 pg (ref 26.0–34.0)
MCHC: 33.3 g/dL (ref 30.0–36.0)
MCV: 86.9 fL (ref 78.0–100.0)
Monocytes Absolute: 0.4 10*3/uL (ref 0.1–1.0)
Monocytes Relative: 8 %
NEUTROS ABS: 2.8 10*3/uL (ref 1.7–7.7)
Neutrophils Relative %: 58 %
PLATELETS: 253 10*3/uL (ref 150–400)
RBC: 5.02 MIL/uL (ref 4.22–5.81)
RDW: 12.7 % (ref 11.5–15.5)
WBC: 5 10*3/uL (ref 4.0–10.5)

## 2017-05-11 LAB — BASIC METABOLIC PANEL
ANION GAP: 8 (ref 5–15)
BUN: 13 mg/dL (ref 6–20)
CO2: 29 mmol/L (ref 22–32)
Calcium: 9.2 mg/dL (ref 8.9–10.3)
Chloride: 101 mmol/L (ref 101–111)
Creatinine, Ser: 1.02 mg/dL (ref 0.61–1.24)
GFR calc Af Amer: >60 mL/min (ref >60)
GLUCOSE: 110 mg/dL — AB (ref 65–99)
POTASSIUM: 4.7 mmol/L (ref 3.5–5.1)
Sodium: 138 mmol/L (ref 135–145)

## 2017-05-11 LAB — PROTIME-INR
INR: 0.95
Prothrombin Time: 12.6 seconds (ref 11.4–15.2)

## 2017-05-14 ENCOUNTER — Telehealth: Payer: Self-pay

## 2017-05-14 NOTE — Telephone Encounter (Signed)
Left generic message for call back.  No DPR on file.  Instruct to take ASA and Plavix prior to arrival.  Hold lasix.  Left this nurse name and # for call back.

## 2017-05-15 ENCOUNTER — Ambulatory Visit (HOSPITAL_COMMUNITY)
Admission: RE | Admit: 2017-05-15 | Discharge: 2017-05-15 | Disposition: A | Discharge status: Home / Self Care | Payer: 59 | Source: Ambulatory Visit | Attending: Cardiovascular Disease | Admitting: Cardiovascular Disease

## 2017-05-15 ENCOUNTER — Ambulatory Visit (HOSPITAL_COMMUNITY)
Admission: RE | Disposition: A | Discharge status: Home / Self Care | Payer: Self-pay | Source: Ambulatory Visit | Attending: Cardiovascular Disease

## 2017-05-15 DIAGNOSIS — Z7982 Long term (current) use of aspirin: Secondary | ICD-10-CM | POA: Diagnosis not present

## 2017-05-15 DIAGNOSIS — K219 Gastro-esophageal reflux disease without esophagitis: Secondary | ICD-10-CM | POA: Insufficient documentation

## 2017-05-15 DIAGNOSIS — F329 Major depressive disorder, single episode, unspecified: Secondary | ICD-10-CM | POA: Insufficient documentation

## 2017-05-15 DIAGNOSIS — I119 Hypertensive heart disease without heart failure: Secondary | ICD-10-CM | POA: Insufficient documentation

## 2017-05-15 DIAGNOSIS — Z955 Presence of coronary angioplasty implant and graft: Secondary | ICD-10-CM

## 2017-05-15 DIAGNOSIS — Y831 Surgical operation with implant of artificial internal device as the cause of abnormal reaction of the patient, or of later complication, without mention of misadventure at the time of the procedure: Secondary | ICD-10-CM | POA: Diagnosis not present

## 2017-05-15 DIAGNOSIS — G40909 Epilepsy, unspecified, not intractable, without status epilepticus: Secondary | ICD-10-CM | POA: Diagnosis not present

## 2017-05-15 DIAGNOSIS — Z7951 Long term (current) use of inhaled steroids: Secondary | ICD-10-CM | POA: Insufficient documentation

## 2017-05-15 DIAGNOSIS — I252 Old myocardial infarction: Secondary | ICD-10-CM | POA: Diagnosis not present

## 2017-05-15 DIAGNOSIS — I10 Essential (primary) hypertension: Secondary | ICD-10-CM | POA: Diagnosis present

## 2017-05-15 DIAGNOSIS — E785 Hyperlipidemia, unspecified: Secondary | ICD-10-CM | POA: Diagnosis present

## 2017-05-15 DIAGNOSIS — I255 Ischemic cardiomyopathy: Secondary | ICD-10-CM | POA: Diagnosis not present

## 2017-05-15 DIAGNOSIS — I2510 Atherosclerotic heart disease of native coronary artery without angina pectoris: Secondary | ICD-10-CM | POA: Diagnosis present

## 2017-05-15 DIAGNOSIS — Z87891 Personal history of nicotine dependence: Secondary | ICD-10-CM | POA: Insufficient documentation

## 2017-05-15 DIAGNOSIS — E782 Mixed hyperlipidemia: Secondary | ICD-10-CM | POA: Diagnosis not present

## 2017-05-15 DIAGNOSIS — T82855A Stenosis of coronary artery stent, initial encounter: Secondary | ICD-10-CM | POA: Insufficient documentation

## 2017-05-15 DIAGNOSIS — F419 Anxiety disorder, unspecified: Secondary | ICD-10-CM | POA: Insufficient documentation

## 2017-05-15 DIAGNOSIS — Z7902 Long term (current) use of antithrombotics/antiplatelets: Secondary | ICD-10-CM | POA: Insufficient documentation

## 2017-05-15 DIAGNOSIS — R931 Abnormal findings on diagnostic imaging of heart and coronary circulation: Secondary | ICD-10-CM | POA: Diagnosis present

## 2017-05-15 HISTORY — PX: CORONARY STENT INTERVENTION: CATH118234

## 2017-05-15 HISTORY — PX: LEFT HEART CATH AND CORONARY ANGIOGRAPHY: CATH118249

## 2017-05-15 LAB — POCT ACTIVATED CLOTTING TIME: Activated Clotting Time: 450 seconds

## 2017-05-15 SURGERY — LEFT HEART CATH AND CORONARY ANGIOGRAPHY
Anesthesia: LOCAL

## 2017-05-15 MED ORDER — SODIUM CHLORIDE 0.9 % WEIGHT BASED INFUSION
1.0000 mL/kg/h | INTRAVENOUS | Status: DC
  Administered 2017-05-15: 1 mL/kg/h via INTRAVENOUS

## 2017-05-15 MED ORDER — HEPARIN SODIUM (PORCINE) 1000 UNIT/ML IJ SOLN
INTRAMUSCULAR | Status: DC | PRN
  Administered 2017-05-15: 5000 [IU] via INTRAVENOUS

## 2017-05-15 MED ORDER — SODIUM CHLORIDE 0.9 % WEIGHT BASED INFUSION: 1.0000 mL/kg/h | INTRAVENOUS | Status: DC

## 2017-05-15 MED ORDER — MIDAZOLAM HCL 2 MG/2ML IJ SOLN
INTRAMUSCULAR | Status: DC | PRN
  Administered 2017-05-15: 1 mg via INTRAVENOUS

## 2017-05-15 MED ORDER — IOPAMIDOL (ISOVUE-370) INJECTION 76%
INTRAVENOUS | Status: DC | PRN
  Administered 2017-05-15: 120 mL via INTRAVENOUS

## 2017-05-15 MED ORDER — PRAVASTATIN SODIUM 20 MG PO TABS: 20.0000 mg | ORAL_TABLET | Freq: Every evening | ORAL | 3 refills | Status: DC

## 2017-05-15 MED ORDER — BIVALIRUDIN BOLUS VIA INFUSION - CUPID
INTRAVENOUS | Status: DC | PRN
  Administered 2017-05-15: 87.45 mg via INTRAVENOUS

## 2017-05-15 MED ORDER — SODIUM CHLORIDE 0.9% FLUSH: 3.0000 mL | INTRAVENOUS | Status: DC | PRN

## 2017-05-15 MED ORDER — LABETALOL HCL 5 MG/ML IV SOLN: 10.0000 mg | INTRAVENOUS | Status: AC | PRN

## 2017-05-15 MED ORDER — NITROGLYCERIN 0.4 MG SL SUBL
0.4000 mg | SUBLINGUAL_TABLET | SUBLINGUAL | 3 refills | Status: DC | PRN
Start: 2017-05-15 — End: 2021-11-11

## 2017-05-15 MED ORDER — ASPIRIN 81 MG PO CHEW: 81.0000 mg | CHEWABLE_TABLET | ORAL | Status: AC

## 2017-05-15 MED ORDER — ANGIOPLASTY BOOK
Freq: Once | Status: DC
  Filled 2017-05-15: qty 1

## 2017-05-15 MED ORDER — PANTOPRAZOLE SODIUM 40 MG PO TBEC: 40.0000 mg | DELAYED_RELEASE_TABLET | Freq: Every day | ORAL | 3 refills | Status: AC

## 2017-05-15 MED ORDER — FENTANYL CITRATE (PF) 100 MCG/2ML IJ SOLN
INTRAMUSCULAR | Status: DC | PRN
  Administered 2017-05-15: 25 ug via INTRAVENOUS

## 2017-05-15 MED ORDER — HEPARIN (PORCINE) IN NACL 2-0.9 UNIT/ML-% IJ SOLN
INTRAMUSCULAR | Status: AC | PRN
  Administered 2017-05-15: 1000 mL

## 2017-05-15 MED ORDER — SODIUM CHLORIDE 0.9 % WEIGHT BASED INFUSION
3.0000 mL/kg/h | INTRAVENOUS | Status: AC
Start: 2017-05-15 — End: 2017-05-15
  Administered 2017-05-15: 3 mL/kg/h via INTRAVENOUS

## 2017-05-15 MED ORDER — ONDANSETRON HCL 4 MG/2ML IJ SOLN: 4.0000 mg | Freq: Four times a day (QID) | INTRAMUSCULAR | Status: DC | PRN

## 2017-05-15 MED ORDER — SODIUM CHLORIDE 0.9 % IV SOLN
INTRAVENOUS | Status: DC | PRN
  Administered 2017-05-15: 1.75 mg/kg/h via INTRAVENOUS

## 2017-05-15 MED ORDER — IOPAMIDOL (ISOVUE-370) INJECTION 76%
INTRAVENOUS | Status: AC
  Filled 2017-05-15: qty 100

## 2017-05-15 MED ORDER — SODIUM CHLORIDE 0.9% FLUSH: 3.0000 mL | Freq: Two times a day (BID) | INTRAVENOUS | Status: DC

## 2017-05-15 MED ORDER — LIDOCAINE HCL (PF) 1 % IJ SOLN
INTRAMUSCULAR | Status: DC | PRN
  Administered 2017-05-15: 2 mL

## 2017-05-15 MED ORDER — HYDRALAZINE HCL 20 MG/ML IJ SOLN: 5.0000 mg | INTRAMUSCULAR | Status: AC | PRN

## 2017-05-15 MED ORDER — MIDAZOLAM HCL 2 MG/2ML IJ SOLN
INTRAMUSCULAR | Status: AC
  Filled 2017-05-15: qty 2

## 2017-05-15 MED ORDER — NITROGLYCERIN 1 MG/10 ML FOR IR/CATH LAB
INTRA_ARTERIAL | Status: AC
  Filled 2017-05-15: qty 10

## 2017-05-15 MED ORDER — SODIUM CHLORIDE 0.9 % IV SOLN: 250.0000 mL | INTRAVENOUS | Status: DC | PRN

## 2017-05-15 MED ORDER — HEPARIN SODIUM (PORCINE) 1000 UNIT/ML IJ SOLN
INTRAMUSCULAR | Status: AC
  Filled 2017-05-15: qty 1

## 2017-05-15 MED ORDER — CARVEDILOL 25 MG PO TABS: 25.0000 mg | ORAL_TABLET | Freq: Two times a day (BID) | ORAL | 3 refills | Status: DC

## 2017-05-15 MED ORDER — BIVALIRUDIN TRIFLUOROACETATE 250 MG IV SOLR
INTRAVENOUS | Status: AC
  Filled 2017-05-15: qty 250

## 2017-05-15 MED ORDER — FENTANYL CITRATE (PF) 100 MCG/2ML IJ SOLN
INTRAMUSCULAR | Status: AC
  Filled 2017-05-15: qty 2

## 2017-05-15 MED ORDER — LIDOCAINE HCL (PF) 1 % IJ SOLN
INTRAMUSCULAR | Status: AC
  Filled 2017-05-15: qty 30

## 2017-05-15 MED ORDER — ISOSORBIDE MONONITRATE ER 30 MG PO TB24: 30.0000 mg | ORAL_TABLET | Freq: Every day | ORAL | 3 refills | Status: DC

## 2017-05-15 MED ORDER — CLOPIDOGREL BISULFATE 75 MG PO TABS: 75.0000 mg | ORAL_TABLET | Freq: Every day | ORAL | 0 refills | Status: DC

## 2017-05-15 MED ORDER — VERAPAMIL HCL 2.5 MG/ML IV SOLN
INTRAVENOUS | Status: AC
  Filled 2017-05-15: qty 2

## 2017-05-15 MED ORDER — ACETAMINOPHEN 325 MG PO TABS: 650.0000 mg | ORAL_TABLET | ORAL | Status: DC | PRN

## 2017-05-15 MED ORDER — CLOPIDOGREL BISULFATE 75 MG PO TABS: 75.0000 mg | ORAL_TABLET | Freq: Every day | ORAL | 3 refills | Status: DC

## 2017-05-15 MED ORDER — HEPARIN (PORCINE) IN NACL 2-0.9 UNIT/ML-% IJ SOLN
INTRAMUSCULAR | Status: AC
  Filled 2017-05-15: qty 1000

## 2017-05-15 MED ORDER — HEPARIN (PORCINE) IN NACL 2-0.9 UNIT/ML-% IJ SOLN
INTRAMUSCULAR | Status: DC | PRN
  Administered 2017-05-15: 10 mL via INTRA_ARTERIAL

## 2017-05-15 MED ORDER — FUROSEMIDE 20 MG PO TABS: 20.0000 mg | ORAL_TABLET | Freq: Every day | ORAL | 3 refills | Status: DC | PRN

## 2017-05-15 MED FILL — CLOPIDOGREL 75 MG TABLET: 75 | 30 days supply | Qty: 30 | Fill #0

## 2017-05-15 SURGICAL SUPPLY — 21 items
BALLN SAPPHIRE 3.0X15 (BALLOONS) ×2
BALLN SAPPHIRE ~~LOC~~ 4.0X18 (BALLOONS) ×2 IMPLANT
BALLOON SAPPHIRE 3.0X15 (BALLOONS) ×1 IMPLANT
CATH INFINITI 5 FR JL3.5 (CATHETERS) ×2 IMPLANT
CATH INFINITI 5FR ANG PIGTAIL (CATHETERS) ×2 IMPLANT
CATH INFINITI JR4 5F (CATHETERS) ×2 IMPLANT
CATH LAUNCHER 6FR EBU 3 (CATHETERS) ×2 IMPLANT
DEVICE RAD COMP TR BAND LRG (VASCULAR PRODUCTS) ×2 IMPLANT
ELECT DEFIB PAD ADLT CADENCE (PAD) ×2 IMPLANT
GLIDESHEATH SLEND SS 6F .021 (SHEATH) ×2 IMPLANT
GUIDEWIRE INQWIRE 1.5J.035X260 (WIRE) ×1 IMPLANT
INQWIRE 1.5J .035X260CM (WIRE) ×2
KIT ENCORE 26 ADVANTAGE (KITS) ×2 IMPLANT
KIT HEART LEFT (KITS) ×2 IMPLANT
PACK CARDIAC CATHETERIZATION (CUSTOM PROCEDURE TRAY) ×2 IMPLANT
STENT SIERRA 3.50 X 28 MM (Permanent Stent) ×2 IMPLANT
SYR 10CC CONTROL (SYRINGE) ×2 IMPLANT
TRANSDUCER W/STOPCOCK (MISCELLANEOUS) ×2 IMPLANT
TUBING CIL FLEX 10 FLL-RA (TUBING) ×2 IMPLANT
WIRE ASAHI PROWATER 180CM (WIRE) ×2 IMPLANT
WIRE COUGAR XT STRL 190CM (WIRE) ×2 IMPLANT

## 2017-05-15 NOTE — Discharge Instructions (Signed)

## 2017-05-15 NOTE — Interval H&P Note (Signed)
Cath Lab Visit (complete for each Cath Lab visit)  Clinical Evaluation Leading to the Procedure:   ACS: No.  Non-ACS:    Anginal Classification: CCS II  Anti-ischemic medical therapy: Maximal Therapy (2 or more classes of medications)  Non-Invasive Test Results: High-risk stress test findings: cardiac mortality >3%/year  Prior CABG: No previous CABG      History and Physical Interval Note:  05/15/2017 7:31 AM  [name] Cummings  has presented today for surgery, with the diagnosis of cp  The various methods of treatment have been discussed with the patient and family. After consideration of risks, benefits and other options for treatment, the patient has consented to  Procedure(s): LEFT HEART CATH AND CORONARY ANGIOGRAPHY (N/A) as a surgical intervention .  The patient's history has been reviewed, patient examined, no change in status, stable for surgery.  I have reviewed the patient's chart and labs.  Questions were answered to the patient's satisfaction.     Audelia HivesMichale Jena Tegeler

## 2017-05-15 NOTE — Progress Notes (Signed)
[id number] Education completed with pt,daughter and wife has pt has short term memory loss and needs constant reinforcement. Reviewed NTG use, heart healthy food choices, ex ed , plavix and stent. Discussed CRP 2 and will refer to [facility] but pt will probably not be able to attend. Wife stated he went with last stent but needed constant cueing to ex and spent a good deal of time sitting so they quit after about 6 weeks. It was a strain for her to try to get him to program and work also. Encouraged walking with family. Luetta Nuttingharlene Charmane Protzman RN BSN 05/15/2017 2:21 PM

## 2017-05-15 NOTE — Progress Notes (Signed)
Arm board applied to right wrist and forearm per MD request with instructions to leave it in place for a minimum of 24-48 hours. Discharge and self care instructions discussed with patient and wife. Patient discharged to home with wife at his side.  Patient in stable condition with no complaints and without incident.

## 2017-05-15 NOTE — Discharge Summary (Signed)
Discharge Summary    Patient ID: [name] Cummings,  MRN: [id number], DOB/AGE: 56/08/1960 56 y.o.  Admit date: 05/15/2017 Discharge date: 05/15/2017  Primary Care Provider: Barbie BannerWilson, Fred Cummings Primary Cardiologist: Dr. Wyline Cummings  Discharge Diagnoses    Principal Problem:   Abnormal nuclear cardiac imaging test Active Problems:   Hyperlipidemia LDL goal <70   Essential hypertension   CAD, NATIVE VESSEL   CAD (coronary artery disease)   Allergies Allergies  Allergen Reactions  . Hydromorphone Hcl Other (See Comments)    REACTION: Reaction not known  . Lorazepam Other (See Comments)    REACTION: Reaction not known  . Pseudoephedrine Other (See Comments)    REACTION: Reaction not known  . Terfenadine Other (See Comments)    REACTION: Reaction not known  . Atorvastatin Other (See Comments)    Pt c/o myalgias    Diagnostic Studies/Procedures     CORONARY STENT INTERVENTION  LEFT HEART CATH AND CORONARY ANGIOGRAPHY  Conclusion   1.  Severe ostial left circumflex stenosis treated successfully with drug-eluting stent implantation (3.5 x 28 mm Sierra DES postdilated with a 4.0 mm noncompliant balloon) 2.  Moderate diffuse in-stent restenosis of the LAD 3.  Severe stenosis of the RCA ostium (nondominant vessel) 4.  Normal LVEDP  Continue long-term dual antiplatelet therapy with aspirin and clopidogrel.  As long as no complications arise patient is eligible for same day discharge.    Diagnostic Diagram       Post-Intervention Diagram           History of Present Illness     [name] Cummings is a 56 y.o.male with hx of CAD, HTN, HLD and GERD presented for outpatient cath.   Hx of CAD s/p BMS to LAD in 2007, followed by stent thrombosis with resulting VF arrest. LAD was restented at that time. - cath 2010 LAD 50% prox, 30% ISR in mid LAD, distal LAD 70-80%, LCX 40% ostial, 70-80% narrowing proximal portion LCX in the ramus branch, RCA 70-80% proximal. LVEF 45%, the distal  LAD was stented.  - cath Jan 2017 severe ISR of LAD stent, received another DES. Small OM1 subtotally occluded with left to left collaterals.  Recently has chest pain and shortness of breath. Follow up nuclear stress showed anterolateral infarct with mild peri-infarct ischemia. Large lateral/inferolateral/inferior/inferioapical defect with moderate ischemia. LVEF 30-44% by nuclear. 06/2016 echo LVEF 50-55%, multiple WMAs. Recommended cath.   Hospital Course     Consultants: None  Cath showed severe ostial left circumflex stenosis treated successfully with drug-eluting stent implantation (3.5 x 28 mm Sierra DES postdilated with a 4.0 mm noncompliant balloon), moderate diffuse in-stent restenosis of the LAD and severe stenosis of the RCA ostium (nondominant vessel). The patient tolerated procedure well. No complication. DAPT with ASA and plavix long term. No change in home meds. Refill given per request. Radial cath site stable. Felt candidate for same day PCI.   The patient has been seen by [name] Cummings today and deemed ready for discharge home. All follow-up appointments have been scheduled. Discharge medications are listed below.    Discharge Vitals Blood pressure (!) 121/58, pulse (!) 53, temperature 98.1 F (36.7 C), temperature source Oral, resp. rate 16, height 6' 1.5" (1.867 m), weight 257 lb (116.6 kg), SpO2 98 %.  Filed Weights   05/15/17 0536  Weight: 257 lb (116.6 kg)    Labs & Radiologic Studies     Dg Chest 2 View  Result Date: 05/11/2017 CLINICAL DATA:  Preoperative evaluation.  Left chest swelling EXAM: CHEST  2 VIEW COMPARISON:  August 10, 2015 FINDINGS: There is no edema or consolidation. Heart is upper normal in size with pulmonary vascularity within normal limits. There is calcification in the left anterior descending coronary artery. No adenopathy. There is old rib trauma on the left, stable. IMPRESSION: Coronary artery calcification noted. No edema or consolidation.  Stable cardiac silhouette. Electronically Signed   By: [name] BangWilliam  Woodruff Cummings M.D.   On: 05/11/2017 07:31   Nm Myocar Multi W/spect W/wall Motion / Ef  Result Date: 05/01/2017  There was no ST segment deviation noted during stress.  Findings consistent with prior anterolateral myocardial infarction with mild peri-infarct ischemia. Large lateral/inferolatera/inferior/inferoapical defect with moderate ischemia  This is a high risk study. High risk due to low ejection fraction and multiple areas of ischemia. Mild area of ischemia anterolateral. Moderate area of ischemia lateral/inferior walls  The left ventricular ejection fraction is moderately decreased (30-44%).     Disposition   Pt is being discharged home today in good condition.  Follow-up Plans & Appointments    Follow-up Information    [name] Cummings. Go on 05/29/2017.   Specialties:  Nurse Practitioner, Radiology, Cardiology Why:  @3 :30pm for cath follow up Contact information: [address] [place] [id number] [phone]          Discharge Instructions    Amb Referral to Cardiac Rehabilitation   Complete by:  As directed    Diagnosis:  Coronary Stents   Diet - low sodium heart healthy   Complete by:  As directed    Discharge instructions   Complete by:  As directed    No driving for 48 hours. No lifting over 5 lbs for 1 week. No sexual activity for 1 week. Keep procedure site clean & dry. If you notice increased pain, swelling, bleeding or pus, call/return!  You may shower, but no soaking baths/hot tubs/pools for 1 week.   Increase activity slowly   Complete by:  As directed       Discharge Medications   Current Discharge Medication List    CONTINUE these medications which have CHANGED   Details  carvedilol (COREG) 25 MG tablet Take 1 tablet (25 mg total) 2 (two) times daily with a meal by mouth. Qty: 180 tablet, Refills: 3    clopidogrel (PLAVIX) 75 MG tablet Take 1 tablet (75 mg total) daily  by mouth. Same day PCI Qty: 90 tablet, Refills: 3    furosemide (LASIX) 20 MG tablet Take 1 tablet (20 mg total) daily as needed by mouth for fluid. Qty: 30 tablet, Refills: 3    isosorbide mononitrate (IMDUR) 30 MG 24 hr tablet Take 1 tablet (30 mg total) daily by mouth. Qty: 90 tablet, Refills: 3    nitroGLYCERIN (NITROSTAT) 0.4 MG SL tablet Place 1 tablet (0.4 mg total) every 5 (five) minutes as needed under the tongue for chest pain. Qty: 25 tablet, Refills: 3    pantoprazole (PROTONIX) 40 MG tablet Take 1 tablet (40 mg total) daily by mouth. Qty: 90 tablet, Refills: 3    pravastatin (PRAVACHOL) 20 MG tablet Take 1 tablet (20 mg total) every evening by mouth. Qty: 90 tablet, Refills: 3      CONTINUE these medications which have NOT CHANGED   Details  aspirin EC 81 MG tablet Take 81 mg by mouth daily.    FLUoxetine (PROZAC) 20 MG capsule Take 20 mg by mouth at bedtime.  fluticasone (FLONASE) 50 MCG/ACT nasal spray Place 1 spray into both nostrils daily as needed for allergies.     ibuprofen (ADVIL,MOTRIN) 200 MG tablet Take 200 mg every 6 (six) hours as needed by mouth.    loratadine (CLARITIN) 10 MG tablet Take 10 mg daily as needed by mouth for allergies.            Outstanding Labs/Studies   None  Duration of Discharge Encounter   Greater than 30 minutes including physician time.  Signed, Sharrell Ku Nishawn Rotan PA-C 05/15/2017, 2:48 PM

## 2017-05-18 ENCOUNTER — Encounter (HOSPITAL_COMMUNITY): Payer: Self-pay | Admitting: Cardiovascular Disease

## 2017-05-18 MED FILL — Nitroglycerin IV Soln 100 MCG/ML in D5W: INTRA_ARTERIAL | Qty: 10 | Status: AC

## 2017-05-19 ENCOUNTER — Telehealth: Payer: Self-pay | Admitting: *Deleted

## 2017-05-19 MED ORDER — RANOLAZINE ER 500 MG PO TB12: 500.0000 mg | ORAL_TABLET | Freq: Two times a day (BID) | ORAL | 6 refills | Status: DC

## 2017-05-19 MED ORDER — ISOSORBIDE MONONITRATE ER 30 MG PO TB24: 15.0000 mg | ORAL_TABLET | Freq: Every day | ORAL | 3 refills | Status: DC

## 2017-05-19 MED ORDER — RANOLAZINE ER 500 MG PO TB12
500.0000 mg | ORAL_TABLET | Freq: Two times a day (BID) | ORAL | 3 refills | Status: DC
Start: 2017-05-19 — End: 2017-08-19

## 2017-05-19 NOTE — Telephone Encounter (Signed)
Spoke with wife. Pt has c/o increased dizziness today. Pt is being cared for today by mother-in law. Wife states that pt continues to c/o chest pressure just as before cath. She does state that he is moving around the house a little more each day. His BP is 115/70 HR 55. Please advise.

## 2017-05-19 NOTE — Telephone Encounter (Signed)
Can we lower his imdur to 15mg  for dizziness. Can we see if we can get him on ranexa 500mg  bid   Dominga FerryJ Rheannon Cerney MD

## 2017-05-19 NOTE — Telephone Encounter (Signed)
Wife notified.

## 2017-05-19 NOTE — Telephone Encounter (Signed)
Called wife. LMTCB.
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
## 2017-05-29 ENCOUNTER — Encounter: Payer: Self-pay | Admitting: Adult Health

## 2017-05-29 ENCOUNTER — Ambulatory Visit (HOSPITAL_COMMUNITY)
Admission: RE | Admit: 2017-05-29 | Discharge: 2017-05-29 | Disposition: A | Discharge status: Home / Self Care | Payer: 59 | Source: Ambulatory Visit | Attending: Adult Health | Admitting: Adult Health

## 2017-05-29 ENCOUNTER — Ambulatory Visit (INDEPENDENT_AMBULATORY_CARE_PROVIDER_SITE_OTHER): Payer: 59 | Admitting: Adult Health

## 2017-05-29 VITALS — BP 114/76 | HR 57 | Wt 256.0 lb

## 2017-05-29 DIAGNOSIS — I10 Essential (primary) hypertension: Secondary | ICD-10-CM

## 2017-05-29 DIAGNOSIS — M25512 Pain in left shoulder: Secondary | ICD-10-CM

## 2017-05-29 DIAGNOSIS — I2510 Atherosclerotic heart disease of native coronary artery without angina pectoris: Secondary | ICD-10-CM

## 2017-05-29 DIAGNOSIS — R0789 Other chest pain: Secondary | ICD-10-CM

## 2017-05-29 NOTE — Patient Instructions (Signed)
Medication Instructions:  Your physician recommends that you continue on your current medications as directed. Please refer to the Current Medication list given to you today.   Labwork: NONE   Testing/Procedures: Shoulder X-Ray   Follow-Up: Your physician recommends that you schedule a follow-up appointment with Dr. Wyline MoodBranch   Any Other Special Instructions Will Be Listed Below (If Applicable).     If you need a refill on your cardiac medications before your next appointment, please call your pharmacy.  Thank you for choosing [facility]!

## 2017-06-02 ENCOUNTER — Other Ambulatory Visit: Payer: Self-pay | Admitting: Family Medicine

## 2017-06-02 DIAGNOSIS — R0789 Other chest pain: Secondary | ICD-10-CM

## 2017-06-02 DIAGNOSIS — N63 Unspecified lump in unspecified breast: Secondary | ICD-10-CM

## 2017-06-05 ENCOUNTER — Ambulatory Visit: Payer: 59 | Admitting: Adult Health

## 2017-06-09 ENCOUNTER — Ambulatory Visit (HOSPITAL_COMMUNITY)
Admission: RE | Admit: 2017-06-09 | Discharge: 2017-06-09 | Disposition: A | Discharge status: Home / Self Care | Payer: 59 | Source: Ambulatory Visit | Attending: Family Medicine | Admitting: Family Medicine

## 2017-06-09 DIAGNOSIS — N6320 Unspecified lump in the left breast, unspecified quadrant: Secondary | ICD-10-CM | POA: Diagnosis not present

## 2017-06-09 DIAGNOSIS — R0789 Other chest pain: Secondary | ICD-10-CM | POA: Insufficient documentation

## 2017-06-09 DIAGNOSIS — M47892 Other spondylosis, cervical region: Secondary | ICD-10-CM | POA: Diagnosis not present

## 2017-06-09 DIAGNOSIS — H9311 Tinnitus, right ear: Secondary | ICD-10-CM | POA: Insufficient documentation

## 2017-06-09 DIAGNOSIS — H6121 Impacted cerumen, right ear: Secondary | ICD-10-CM | POA: Insufficient documentation

## 2017-06-09 DIAGNOSIS — R2689 Other abnormalities of gait and mobility: Secondary | ICD-10-CM | POA: Insufficient documentation

## 2017-06-09 DIAGNOSIS — N63 Unspecified lump in unspecified breast: Secondary | ICD-10-CM

## 2017-06-14 ENCOUNTER — Other Ambulatory Visit: Payer: 59

## 2017-06-16 ENCOUNTER — Other Ambulatory Visit (HOSPITAL_COMMUNITY): Payer: Self-pay | Admitting: Family Medicine

## 2017-06-16 DIAGNOSIS — M47812 Spondylosis without myelopathy or radiculopathy, cervical region: Secondary | ICD-10-CM

## 2017-06-19 ENCOUNTER — Ambulatory Visit (HOSPITAL_COMMUNITY)
Admission: RE | Admit: 2017-06-19 | Discharge: 2017-06-19 | Disposition: A | Discharge status: Home / Self Care | Payer: 59 | Source: Ambulatory Visit | Attending: Family Medicine | Admitting: Family Medicine

## 2017-06-19 DIAGNOSIS — M4802 Spinal stenosis, cervical region: Secondary | ICD-10-CM | POA: Insufficient documentation

## 2017-06-19 DIAGNOSIS — M4722 Other spondylosis with radiculopathy, cervical region: Secondary | ICD-10-CM | POA: Diagnosis present

## 2017-06-19 DIAGNOSIS — M47812 Spondylosis without myelopathy or radiculopathy, cervical region: Secondary | ICD-10-CM

## 2017-08-10 ENCOUNTER — Other Ambulatory Visit (HOSPITAL_COMMUNITY): Payer: Self-pay | Admitting: Neurological Surgery

## 2017-08-10 DIAGNOSIS — M5414 Radiculopathy, thoracic region: Secondary | ICD-10-CM

## 2017-08-14 ENCOUNTER — Ambulatory Visit (HOSPITAL_COMMUNITY): Admission: RE | Admit: 2017-08-14 | Payer: 59 | Source: Ambulatory Visit

## 2017-08-19 ENCOUNTER — Encounter: Payer: Self-pay | Admitting: Cardiology

## 2017-08-19 ENCOUNTER — Ambulatory Visit (INDEPENDENT_AMBULATORY_CARE_PROVIDER_SITE_OTHER): Payer: 59 | Admitting: Cardiology

## 2017-08-19 ENCOUNTER — Ambulatory Visit (HOSPITAL_COMMUNITY): Payer: 59

## 2017-08-19 VITALS — BP 126/78 | HR 72 | Wt 256.0 lb

## 2017-08-19 DIAGNOSIS — I2510 Atherosclerotic heart disease of native coronary artery without angina pectoris: Secondary | ICD-10-CM

## 2017-08-19 DIAGNOSIS — R079 Chest pain, unspecified: Secondary | ICD-10-CM

## 2017-08-19 DIAGNOSIS — E782 Mixed hyperlipidemia: Secondary | ICD-10-CM | POA: Diagnosis not present

## 2017-08-19 NOTE — Patient Instructions (Signed)

## 2017-08-19 NOTE — Progress Notes (Signed)
Clinical Summary Mr. Njoku is a 57 y.o.male seen today for follow up of the following medical problems.   1. CAD - hx of BMS to LAD in 2007, followed by stent thrombosis with resulting VF arrest. LAD was restented at that time. - cath 2010 LAD 50% prox, 30% ISR in mid LAD, distal LAD 70-80%, LCX 40% ostial, 70-80% narrowing proximal portion LCX in the ramus Shikara Mcauliffe, RCA 70-80% proximal. LVEF 45%, the distal LAD was stented.  - cath Jan 2017 severe ISR of LAD stent, received another DES. Small OM1 subtotally occluded with left to left collaterals - echo Jan 2016 LVEF 60-65% - seen in [facility] 08/2015 with atypical chest pain, no evidence of ACS. Imdur was added.     04/2017 nuclear stress: anterolateral infarct with mild peri-infarct ischemia. Large lateral/inferolateral/inferior/inferioapical defect with moderate ischemia. LVEF 30-44% by nuclear.  - 04/2017 echo LVEF 50-55%, multiple WMAs  -04/2017 cath severe LCX ostial disease, received DES. Moderate ISR of LAD stent. Severe ostial disease of small nondominant RCA.   - still with chronic pectoral and chest pain. Not improved after his most recent stent. Appears symptoms may be noncardiac. Occasional SOB - compliant with meds.    2. 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 Patient is not able to give description of symptoms, symptoms are all relayed through his wife.   3. Cervical spondylosis - noted on recent MRI  4. Hyperlipidemia - 03/2017 TC 197 TG 359 HDL 41 LDL 84 - compliant with meds Past Medical History:  Diagnosis Date  . Anxiety   . 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  . CAD (coronary artery disease)    a. 2007 Ant MI/VF Arrest/PCI LAD w/ BMS;  b. 07/2015 Cath/PCI: LM nl, LAD 80 ISR (2.5x28 Synergy DES), LCX min irregs, OM1 99, RCA 50p.  . Depression   . Dysphagia, unspecified(787.20)   . GERD (gastroesophageal reflux disease)   . History of  seizure disorder   . Hyperlipidemia, mixed   . Hypertensive heart disease    unspecified  . Ischemic cardiomyopathy    a. 06/2014 Echo: EF 60-65%.  . 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  . Nephrolithiasis   . 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     Allergies  Allergen Reactions  . Hydromorphone Hcl Other (See Comments)    REACTION: Reaction not known  . Lorazepam Other (See Comments)    REACTION: Reaction not known  . Pseudoephedrine Other (See Comments)    REACTION: Reaction not known  . Terfenadine Other (See Comments)    REACTION: Reaction not known  . Atorvastatin Other (See Comments)    Pt c/o myalgias     Current Outpatient Medications  Medication Sig Dispense Refill  . aspirin EC 81 MG tablet Take 81 mg by mouth daily.    . carvedilol (COREG) 25 MG tablet Take 1 tablet (25 mg total) 2 (two) times daily with a meal by mouth. 180 tablet 3  . clopidogrel (PLAVIX) 75 MG tablet Take 1 tablet (75 mg total) daily by mouth. Same day PCI 90 tablet 3  . FLUoxetine (PROZAC) 20 MG capsule Take 20 mg by mouth at bedtime.     . fluticasone (FLONASE) 50 MCG/ACT nasal spray Place 1 spray into both nostrils daily as needed for allergies.     . furosemide (LASIX) 20 MG  tablet Take 1 tablet (20 mg total) daily as needed by mouth for fluid. 30 tablet 3  . ibuprofen (ADVIL,MOTRIN) 200 MG tablet Take 200 mg every 6 (six) hours as needed by mouth.    . isosorbide mononitrate (IMDUR) 30 MG 24 hr tablet Take 0.5 tablets (15 mg total) by mouth daily. 45 tablet 3  . loratadine (CLARITIN) 10 MG tablet Take 10 mg daily as needed by mouth for allergies.     . nitroGLYCERIN (NITROSTAT) 0.4 MG SL tablet Place 1 tablet (0.4 mg total) every 5 (five) minutes as needed under the tongue for chest pain. 25 tablet 3  . pantoprazole (PROTONIX) 40 MG tablet Take 1 tablet (40 mg total) daily by mouth. 90 tablet 3  . pravastatin (PRAVACHOL) 20 MG tablet Take 1  tablet (20 mg total) every evening by mouth. 90 tablet 3  . ranolazine (RANEXA) 500 MG 12 hr tablet Take 1 tablet (500 mg total) by mouth 2 (two) times daily. (Patient not taking: Reported on 05/29/2017) 180 tablet 3  . Simethicone (GAS-X PO) Take by mouth as needed.     No current facility-administered medications for this visit.      Past Surgical History:  Procedure Laterality Date  . bedside tracheostomy     with #6 Shiley. 04/14/2006  . CARDIAC CATHETERIZATION  09/2005   with bare metal stent to the mid LAD (Minivision 2.5 x 18 mm) with Provisional balloon angioplasty to the second diagonal.  . CARDIAC CATHETERIZATION  03/2006   with stenting of the mid LAD with Taxus 2.5 x 20 mm and 2.5 x 8 mm stents.      Marland Kitchen CARDIAC CATHETERIZATION N/A 07/20/2015   Procedure: Left Heart Cath and Coronary Angiography;  Surgeon: Corky Crafts, MD;  Location: [facility] INVASIVE CV LAB;  Service: Cardiovascular;  Laterality: N/A;  . CARDIAC CATHETERIZATION N/A 07/20/2015   Procedure: Coronary Stent Intervention;  Surgeon: Corky Crafts, MD;  Location: [facility] INVASIVE CV LAB;  Service: Cardiovascular;  Laterality: N/A;  . CORONARY STENT INTERVENTION N/A 05/15/2017   Procedure: CORONARY STENT INTERVENTION;  Surgeon: Tonny Bollman, MD;  Location: [facility] INVASIVE CV LAB;  Service: Cardiovascular;  Laterality: N/A;  . CORONARY STENT PLACEMENT  07/20/2015   LAD with DES  . ESOPHAGOGASTRODUODENOSCOPY    . KIDNEY STONE SURGERY    . LEFT HEART CATH AND CORONARY ANGIOGRAPHY N/A 05/15/2017   Procedure: LEFT HEART CATH AND CORONARY ANGIOGRAPHY;  Surgeon: Tonny Bollman, MD;  Location: [facility] INVASIVE CV LAB;  Service: Cardiovascular;  Laterality: N/A;  . PEG TUBE PLACEMENT    . PEG TUBE REMOVAL    . stent thrombosis  03/2006   Complicated my sudden cardiac death  . TRACHEOSTOMY CLOSURE       Allergies  Allergen Reactions  . Hydromorphone Hcl Other (See Comments)    REACTION: Reaction not known  . Lorazepam  Other (See Comments)    REACTION: Reaction not known  . Pseudoephedrine Other (See Comments)    REACTION: Reaction not known  . Terfenadine Other (See Comments)    REACTION: Reaction not known  . Atorvastatin Other (See Comments)    Pt c/o myalgias      Family History  Problem Relation Age of Onset  . Heart attack Mother   . Heart attack Father   . Coronary artery disease Neg Hx        Siblings     Social History Mr. Lemmons reports that  has never smoked. He quit smokeless tobacco  use about 11 years ago. His smokeless tobacco use included chew. Mr. Doten reports that he does not drink alcohol.   Review of Systems CONSTITUTIONAL: No weight loss, fever, chills, weakness or fatigue.  HEENT: Eyes: No visual loss, blurred vision, double vision or yellow sclerae.No hearing loss, sneezing, congestion, runny nose or sore throat.  SKIN: No rash or itching.  CARDIOVASCULAR: per hpi RESPIRATORY: per hpi GASTROINTESTINAL: No anorexia, nausea, vomiting or diarrhea. No abdominal pain or blood.  GENITOURINARY: No burning on urination, no polyuria NEUROLOGICAL: No headache, dizziness, syncope, paralysis, ataxia, numbness or tingling in the extremities. No change in bowel or bladder control.  MUSCULOSKELETAL: No muscle, back pain, joint pain or stiffness.  LYMPHATICS: No enlarged nodes. No history of splenectomy.  PSYCHIATRIC: No history of depression or anxiety.  ENDOCRINOLOGIC: No reports of sweating, cold or heat intolerance. No polyuria or polydipsia.  Marland Kitchen   Physical Examination Vitals:   08/19/17 1558  BP: 126/78  Pulse: 72  SpO2: 98%   Vitals:   08/19/17 1558  Weight: 256 lb (116.1 kg)    Gen: resting comfortably, no acute distress HEENT: no scleral icterus, pupils equal round and reactive, no palptable cervical adenopathy,  CV: RRR, no m/r/g, no jvd Resp: Clear to auscultation bilaterally GI: abdomen is soft, non-tender, non-distended, normal bowel sounds, no  hepatosplenomegaly MSK: extremities are warm, no edema.  Skin: warm, no rash Neuro:  no focal deficits Psych: appropriate affect   Diagnostic Studies 11/2008 Cath RESULTS: Left main coronary artery: The left main coronary artery was free of significant disease.  Left anterior descending: The left anterior descending artery gave rise to small diagonal Jiselle Sheu and small septal perforator, and larger diagonal Zander Ingham. There was 30% narrowing within the midportion of the previously placed stents. Distal to the stent, there was an eccentric lesion, which appeared to be a ruptured plaque, which was estimated to be 70-80% obstructive. There also was 40-50% diffuse narrowing in the proximal LAD.  Circumflex artery: The circumflex artery gave rise to a large ramus Elyse Prevo and two posterolateral branches and a posterior descending Neng Albee. This was a dominant vessel. There was 70-80% narrowing in the proximal portion in the ramus Arnetha Silverthorne which was extended over about 18 mm. There was also a 40% ostial narrowing in the circumflex artery and 50% narrowing in the ostium of the ramus Vesper Trant.  Right coronary artery: The right coronary artery was a nondominant vessel which supplied only right ventricular branches. There was a 70- 80% proximal stenosis.  Left ventriculogram: The left ventriculogram was performed in the RAO projection showed hypokinesis of the anterolateral wall and apex. The estimated fraction was 45%.  Following stenting of the lesion in the mid LAD, the stenosis improved from 80% to 0%.  CONCLUSIONS: 1. Coronary artery disease, status post previous percutaneous coronary  interventions as described above with 40% narrowing in the proximal  LAD, 30% narrowing within the stent in the mid LAD, and 80%  narrowing in the mid LAD after the stent, 70-80% narrowing in the  large ramus Hadiya Spoerl of the circumflex artery with 40%  ostial  stenosis in the dominant circumflex artery, and 80% narrowing in  the proximal portion of a nondominant right coronary artery with  anterolateral wall and apical wall hypokinesis and estimated  ejection fraction of 45%. 2. Successful PCI of the lesion in the mid LAD distal to the  previously placed stent using a XIENCE drug-eluting stent with  improvement in center narrowing from 80% to 0%.  [facility]    Jan 2016 echo Study Conclusions  - Left ventricle: The cavity size was normal. Wall thickness was normal. Systolic function was normal. The estimated ejection fraction was in the range of 60% to 65%. Indeterminant diastolic function. - Aortic valve: Mildly calcified annulus. Mildly thickened leaflets. - Mitral valve: Mildly calcified annulus. Mildly thickened leaflets . - Left atrium: The atrium was mildly dilated. - Systemic veins: IVC is small, suggestive of low RA pressure and hypovolemia. - Technically adequate study.   07/10/15 Clinic EKG (performed and reviewed in clinic): Sinus bradycardia  Jan 2017 Lexiscan MPI  Perfusion Summary Defect 1:  There is a large defect of moderate severity present in the basal anterolateral, mid anterolateral and apical lateral location. The defect is partially reversible. Large, moderate intensity, partially reversible anterolateral defect consistent with scar and at least moderate peri-infarct ischemia.    Overall Study Impression Myocardial perfusion is abnormal. This is a high risk study. Overall left ventricular systolic function was abnormal. Nuclear stress EF: 43%.      Jan 2017 cath  OM1 subtotally occluded with left to left collaterals coming from the apical LAD.  Severe in-stent restenosis of prior bare-metal stent which was placed in 2007 in the setting of his cardiac arrest. This was treated with  cutting balloon angioplasty anddrug-eluting stent placement with a 2.5 x 28 Synergy drug-eluting stent, postdilatto greater than 3 m  Continue dual antiplatelet therapy indefinitely. The patient has significant memory loss after his cardiac arrest. Will discuss with his wife about the importance of staying on his dual antiplatelet therapy.   He'll be watched overnight with anticipated discharge tomorrow  04/2017 Nuclear stress  There was no ST segment deviation noted during stress.  Findings consistent with prior anterolateral myocardial infarction with mild peri-infarct ischemia. Large lateral/inferolatera/inferior/inferoapical defect with moderate ischemia  This is a high risk study. High risk due to low ejection fraction and multiple areas of ischemia. Mild area of ischemia anterolateral. Moderate area of ischemia lateral/inferior walls  The left ventricular ejection fraction is moderately decreased (30-44%).   04/2017 cath 1.  Severe ostial left circumflex stenosis treated successfully with drug-eluting stent implantation (3.5 x 28 mm Sierra DES postdilated with a 4.0 mm noncompliant balloon) 2.  Moderate diffuse in-stent restenosis of the LAD 3.  Severe stenosis of the RCA ostium (nondominant vessel) 4.  Normal LVEDP  Continue long-term dual antiplatelet therapy with aspirin and clopidogrel.  As long as no complications arise patient is eligible for same day discharge.  04/2017 echo Study Conclusions  - Left ventricle: The cavity size was normal. Wall thickness was   increased in a pattern of mild LVH. Systolic function was normal.   The estimated ejection fraction was in the range of 50% to 55%.   Left ventricular diastolic function parameters were normal. - Regional wall motion abnormality: Hypokinesis of the apical   anterior, apical inferior, and apical myocardium. - Aortic valve: Valve area (VTI): 3.01 cm^2. Valve area (Vmax):   3.29 cm^2. - Technically difficult  study.   Assessment and Plan   1. CAD -ongoing chest pain at times, very difficult to ascertain details given patients severe short term memory deficit - does not appear symptoms improved after most recent stent. He has significant cervical disc disease and followed by neurosurgery, possible cervical radiculopathy. Would not repeat cardiac testing at this time, continue current meds  2. Hyperlipidemia - continue statin    F/u  6 months    Antoine Poche, M.D.

## 2017-08-20 ENCOUNTER — Encounter: Payer: Self-pay | Admitting: Cardiology

## 2017-12-10 ENCOUNTER — Other Ambulatory Visit: Payer: Self-pay | Admitting: Family Medicine

## 2017-12-11 ENCOUNTER — Other Ambulatory Visit: Payer: Self-pay | Admitting: Family Medicine

## 2017-12-11 DIAGNOSIS — R0789 Other chest pain: Secondary | ICD-10-CM

## 2017-12-11 DIAGNOSIS — M5414 Radiculopathy, thoracic region: Secondary | ICD-10-CM

## 2017-12-16 ENCOUNTER — Ambulatory Visit
Admission: RE | Admit: 2017-12-16 | Discharge: 2017-12-16 | Disposition: A | Discharge status: Home / Self Care | Payer: 59 | Source: Ambulatory Visit | Attending: Family Medicine | Admitting: Family Medicine

## 2017-12-16 DIAGNOSIS — R0789 Other chest pain: Secondary | ICD-10-CM

## 2017-12-16 DIAGNOSIS — M5414 Radiculopathy, thoracic region: Secondary | ICD-10-CM

## 2018-01-25 ENCOUNTER — Ambulatory Visit (INDEPENDENT_AMBULATORY_CARE_PROVIDER_SITE_OTHER): Payer: 59 | Admitting: Neurology

## 2018-01-25 ENCOUNTER — Encounter: Payer: Self-pay | Admitting: Neurology

## 2018-01-25 VITALS — BP 109/71 | HR 53 | Ht 73.5 in | Wt 252.0 lb

## 2018-01-25 DIAGNOSIS — G4710 Hypersomnia, unspecified: Secondary | ICD-10-CM

## 2018-01-25 DIAGNOSIS — R42 Dizziness and giddiness: Secondary | ICD-10-CM | POA: Diagnosis not present

## 2018-01-25 DIAGNOSIS — G931 Anoxic brain damage, not elsewhere classified: Secondary | ICD-10-CM

## 2018-01-25 NOTE — Progress Notes (Signed)
PATIENT: [name] Cummings DOB: 02/22/1961  Chief Complaint  Patient presents with  . Anoxic Brain Damage/Hydrochephalus    MMSE 22/30 - 10 animals. Orthostatic Vitals: Lying: 109/71, 53, Sitting: 110/75, 59, Standing: 114/77, 65, Standing x 3 minutes: 117/81, 63. Last seen in 08/2015.  He is here with his wife, [name] Cummings and his daughter, [name] Cummings.  Referred back for intermittent slurred speech, slightly worsening memory and dizziness.   . Pain in Head    He complains of frequent sharp pains on the right side of his head.  Marland Kitchen PCP    Barbie Banner, MD     HISTORICAL  [name] Cummings, seen in request by [name] Cummings is 57 years old right-handed male, seen in refer by his primary care physician Barbie Banner, MD  Dr. Doristine Counter for evaluation of left-sided weakness and pain, chronic memory loss, he is accompanied by his wife, and mother-in-law at today's clinical visit.  He suffered heart attack October 2007, ventricular fibrillation, suffered prolonged anoxic brain injury, had persistent memory loss ever since. He also had a history of hypertension, hyperlipidemia,  he had stent for his coronary artery disease. Most recent was stent to left anterior descending in January 2017.  He had 14 years of education, worked as a Scientist, water quality.  He complained of left-sided discomfort since August 06 2015, initially presented with difficulty breathing, nausea or diarrhea, was taken to the hospital, have reviewed the laboratory, negative troponin, normal CBC with exception of mild anemia hemoglobin 12.4, normal CMP.  Over the past few days, he continue complains of left-sided numbness tingling mainly involving his left arm, no gait difficulty, no left arm weakness, left chest discomfort strip-like spreading from left breast to left lower abdomen.   At baseline, patient can carry on simple conversation, helping with her laundry, dressing, bathing, eating, toileting independently no trouble  UPDATE  September 19 2015: He continue to complains of intermittent left-sided discomfort, he has difficulty to elaborate on details, per wife, he had extensive GI, cardiac evaluation, failed to demonstrate etiology   We have reviewed MRI of the brain without contrast in March 2017 in comparison to multiple previous CAT scans, and MRI of the brain in 2008, there is evidence of mild progression of generalized brain atrophy, ventriculomegaly, no evidence of obstruction, mild supratentorium small vessel disease.   EEG in April 2017: Abnormal, secondary to intermittent theta slowing from the left hemisphere, there was no clear epileptiform discharge,  UPDATE January 25 2018: He had cardiac catheterization November 2018, severe ostial left circumflex stenosis was treated with drug eluting stent, moderate diffuse in-stent stenosis of the LAD, severe stenosis of RCA ostium, Echocardiogram in November 2018, ejection fraction 50 to 55%, hypokinesis of apical anterior, apical inferior, and apical myocardium  Since March 2019, he complains of intermittent dizziness, lightheaded spells, in addition, he developed obsession of picking up sticks, tree limbs, he had worsening excessive daytime sleepiness, snoring, woke up choking from sleep sometimes, suggestive of obstructive sleep apnea     REVIEW OF SYSTEMS: Full 14 system review of systems performed and notable only for fatigue, feeling hot, cold, incontinence, memory loss, confusion, numbness, weakness, slurred speech dizziness, snoring  ALLERGIES: Allergies  Allergen Reactions  . Hydromorphone Hcl Other (See Comments)    REACTION: Reaction not known  . Lorazepam Other (See Comments)    REACTION: Reaction not known  . Pseudoephedrine Other (See Comments)    REACTION: Reaction not known  . Terfenadine Other (See  Comments)    REACTION: Reaction not known  . Atorvastatin Other (See Comments)    Pt c/o myalgias    HOME MEDICATIONS: Current Outpatient Medications    Medication Sig Dispense Refill  . aspirin EC 81 MG tablet Take 81 mg by mouth daily.    . carvedilol (COREG) 25 MG tablet Take 1 tablet (25 mg total) 2 (two) times daily with a meal by mouth. 180 tablet 3  . clopidogrel (PLAVIX) 75 MG tablet Take 1 tablet (75 mg total) daily by mouth. Same day PCI 90 tablet 3  . FLUoxetine (PROZAC) 20 MG capsule Take 20 mg by mouth at bedtime.     . fluticasone (FLONASE) 50 MCG/ACT nasal spray Place 1 spray into both nostrils daily as needed for allergies.     . furosemide (LASIX) 20 MG tablet Take 1 tablet (20 mg total) daily as needed by mouth for fluid. 30 tablet 3  . ibuprofen (ADVIL,MOTRIN) 200 MG tablet Take 200 mg every 6 (six) hours as needed by mouth.    . loratadine (CLARITIN) 10 MG tablet Take 10 mg daily as needed by mouth for allergies.     . nitroGLYCERIN (NITROSTAT) 0.4 MG SL tablet Place 1 tablet (0.4 mg total) every 5 (five) minutes as needed under the tongue for chest pain. 25 tablet 3  . pantoprazole (PROTONIX) 40 MG tablet Take 1 tablet (40 mg total) daily by mouth. 90 tablet 3  . pravastatin (PRAVACHOL) 20 MG tablet Take 1 tablet (20 mg total) every evening by mouth. 90 tablet 3   No current facility-administered medications for this visit.     PAST MEDICAL HISTORY: Past Medical History:  Diagnosis Date  . Anxiety   . 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  . CAD (coronary artery disease)    a. 2007 Ant MI/VF Arrest/PCI LAD w/ BMS;  b. 07/2015 Cath/PCI: LM nl, LAD 80 ISR (2.5x28 Synergy DES), LCX min irregs, OM1 99, RCA 50p.  . Depression   . Dizziness   . Dysphagia, unspecified(787.20)   . GERD (gastroesophageal reflux disease)   . History of seizure disorder   . Hyperlipidemia, mixed   . Hypertensive heart disease    unspecified  . Ischemic cardiomyopathy    a. 06/2014 Echo: EF 60-65%.  . 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  . Nephrolithiasis   . Ventricular fibrillation  (HCC)    a. 2007->VF Arrest.    PAST SURGICAL HISTORY: Past Surgical History:  Procedure Laterality Date  . bedside tracheostomy     with #6 Shiley. 04/14/2006  . CARDIAC CATHETERIZATION  09/2005   with bare metal stent to the mid LAD (Minivision 2.5 x 18 mm) with Provisional balloon angioplasty to the second diagonal.  . CARDIAC CATHETERIZATION  03/2006   with stenting of the mid LAD with Taxus 2.5 x 20 mm and 2.5 x 8 mm stents.      Marland Kitchen. CARDIAC CATHETERIZATION N/A 07/20/2015   Procedure: Left Heart Cath and Coronary Angiography;  Surgeon: Corky CraftsJayadeep S Varanasi, MD;  [facility];  Service: Cardiovascular;  Laterality: N/A;  . CARDIAC CATHETERIZATION N/A 07/20/2015   Procedure: Coronary Stent Intervention;  Surgeon: Corky CraftsJayadeep S Varanasi, MD;  [facility];  Service: Cardiovascular;  Laterality: N/A;  . CORONARY STENT INTERVENTION N/A 05/15/2017   Procedure: CORONARY STENT INTERVENTION;  Surgeon: [name], [name], MD;  [facility];  Service: Cardiovascular;  Laterality: N/A;  . CORONARY STENT PLACEMENT  07/20/2015   LAD with DES  . ESOPHAGOGASTRODUODENOSCOPY    . KIDNEY STONE SURGERY    . LEFT HEART CATH AND CORONARY ANGIOGRAPHY N/A 05/15/2017   Procedure: LEFT HEART CATH AND CORONARY ANGIOGRAPHY;  Surgeon: Tonny Bollman, MD;  Location: [facility] INVASIVE CV LAB;  Service: Cardiovascular;  Laterality: N/A;  . PEG TUBE PLACEMENT    . PEG TUBE REMOVAL    . stent thrombosis  03/2006   Complicated my sudden cardiac death  . TRACHEOSTOMY CLOSURE      FAMILY HISTORY: Family History  Problem Relation Age of Onset  . Heart attack Mother   . Diabetes Mother   . Hydrocephalus Mother   . Heart attack Father   . Cancer Father        unsure of type  . Diabetes Sister   . Coronary artery disease Neg Hx        Siblings    SOCIAL HISTORY: Social History   Socioeconomic History  . Marital status: Married    Spouse name: Not on file  . Number of  children: 4  . Years of education: 78  . Highest education level: Not on file  Occupational History  . Occupation: Disabled  Social Needs  . Financial resource strain: Not on file  . Food insecurity:    Worry: Not on file    Inability: Not on file  . Transportation needs:    Medical: Not on file    Non-medical: Not on file  Tobacco Use  . Smoking status: Never Smoker  . Smokeless tobacco: Former Neurosurgeon    Types: Chew  Substance and Sexual Activity  . Alcohol use: No    Alcohol/week: 0.0 oz  . Drug use: No  . Sexual activity: Not on file  Lifestyle  . Physical activity:    Days per week: Not on file    Minutes per session: Not on file  . Stress: Not on file  Relationships  . Social connections:    Talks on phone: Not on file    Gets together: Not on file    Attends religious service: Not on file    Active member of club or organization: Not on file    Attends meetings of clubs or organizations: Not on file    Relationship status: Not on file  . Intimate partner violence:    Fear of current or ex partner: Not on file    Emotionally abused: Not on file    Physically abused: Not on file    Forced sexual activity: Not on file  Other Topics Concern  . Not on file  Social History Narrative   Lives at home with wife.   Right-handed.   One soda and tea daily.     PHYSICAL EXAM   Vitals:   01/25/18 0902  BP: 109/71  Pulse: (!) 53  Weight: 252 lb (114.3 kg)  Height: 6' 1.5" (1.867 m)    Not recorded      Body mass index is 32.8 kg/m.  PHYSICAL EXAMNIATION:  Gen: NAD, conversant, well nourised, obese, well groomed                     Cardiovascular: Regular rate rhythm, no peripheral edema, warm, nontender. Eyes: Conjunctivae clear without exudates or hemorrhage Neck: Supple, no carotid bruits. Pulmonary: Clear to auscultation bilaterally   NEUROLOGICAL EXAM:  MENTAL STATUS: Speech cognition:  Follow simple commands, depend on his wife to provide  history,  MMSE - Mini Mental State Exam 01/25/2018 08/16/2015  Orientation to time 3 1  Orientation to Place 4 4  Registration 3 3  Attention/ Calculation 4 0  Recall 0 0  Language- name 2 objects 2 2  Language- repeat 1 1  Language- follow 3 step command 3 3  Language- read & follow direction 1 1  Write a sentence 0 0  Copy design 1 1  Total score 22 16  animal naming 10.   CRANIAL NERVES: CN II: Visual fields are full to confrontation. Fundoscopic exam is normal with sharp discs and no vascular changes. Pupils are round equal and briskly reactive to light. CN III, IV, VI: extraocular movement are normal. No ptosis. CN V: Facial sensation is intact to pinprick in all 3 divisions bilaterally. Corneal responses are intact.  CN VII: Face is symmetric with normal eye closure and smile. CN VIII: Hearing is normal to rubbing fingers CN IX, X: Palate elevates symmetrically. Phonation is normal. CN XI: Head turning and shoulder shrug are intact CN XII: Tongue is midline with normal movements and no atrophy.  MOTOR: There is no pronator drift of out-stretched arms. Muscle bulk and tone are normal. Muscle strength is normal.  REFLEXES: Reflexes are 2+ and symmetric at the biceps, triceps, knees, and ankles. Plantar responses are flexor.  SENSORY: Intact to light touch, pinprick, positional sensation and vibratory sensation are intact in fingers and toes.  COORDINATION: Rapid alternating movements and fine finger movements are intact. There is no dysmetria on finger-to-nose and heel-knee-shin.    GAIT/STANCE: Posture is normal. Gait is steady with normal steps, base, arm swing, and turning.   Romberg is absent.   DIAGNOSTIC DATA (LABS, IMAGING, TESTING) - I reviewed patient records, labs, notes, testing and imaging myself where available.   ASSESSMENT AND PLAN  JEANMARC VIERNES is a 57 y.o. male   History of anoxic brain injury in 2007.   Worsening dizziness  Ultrasound of  carotid artery to rule out carotid stenosis  There is no orthostatic blood pressure change on today's exam  Possible obstructive sleep apnea  He has loud snoring, frequent choking awakening at nighttime,  Will refer him to sleep study  Levert Feinstein, M.D. Ph.D.  [facility] [address] [place], [place] [address] Ph: [phone] Fax: [phone]  CC: Referring Provider

## 2018-02-02 ENCOUNTER — Encounter (HOSPITAL_COMMUNITY): Payer: 59

## 2018-02-02 ENCOUNTER — Ambulatory Visit (HOSPITAL_COMMUNITY)
Admission: RE | Admit: 2018-02-02 | Discharge: 2018-02-02 | Disposition: A | Discharge status: Home / Self Care | Payer: 59 | Source: Ambulatory Visit | Attending: Neurology | Admitting: Neurology

## 2018-02-02 DIAGNOSIS — G931 Anoxic brain damage, not elsewhere classified: Secondary | ICD-10-CM

## 2018-02-02 DIAGNOSIS — R42 Dizziness and giddiness: Secondary | ICD-10-CM | POA: Diagnosis present

## 2018-02-02 NOTE — Progress Notes (Signed)
Carotid duplex prelim: 1-39% ICA stenosis.  Jaliya Siegmann Eunice, RDMS, RVT   

## 2018-02-04 ENCOUNTER — Telehealth: Payer: Self-pay | Admitting: Neurology

## 2018-02-04 NOTE — Telephone Encounter (Signed)
Ultrasound of carotid showed less than 39% stenosis bilaterally.  Final Interpretation: Right Carotid: Velocities in the right ICA are consistent with a 1-39% stenosis.  Left Carotid: Velocities in the left ICA are consistent with a 1-39% stenosis.

## 2018-02-04 NOTE — Telephone Encounter (Signed)
[place]'s [place] ([place] - she is aware of his carotid ultrasound results and verbalized understanding.

## 2018-02-18 ENCOUNTER — Ambulatory Visit: Payer: 59 | Admitting: Neurology

## 2018-03-24 ENCOUNTER — Encounter: Payer: Self-pay | Admitting: Neurology

## 2018-03-24 ENCOUNTER — Ambulatory Visit (INDEPENDENT_AMBULATORY_CARE_PROVIDER_SITE_OTHER): Payer: 59 | Admitting: Neurology

## 2018-03-24 VITALS — BP 128/76 | HR 63 | Ht 73.5 in | Wt 259.0 lb

## 2018-03-24 DIAGNOSIS — R0681 Apnea, not elsewhere classified: Secondary | ICD-10-CM

## 2018-03-24 DIAGNOSIS — R400 Somnolence: Secondary | ICD-10-CM

## 2018-03-24 DIAGNOSIS — R0683 Snoring: Secondary | ICD-10-CM | POA: Diagnosis not present

## 2018-03-24 DIAGNOSIS — R32 Unspecified urinary incontinence: Secondary | ICD-10-CM

## 2018-03-24 NOTE — Progress Notes (Signed)
Subjective:    Patient ID: [name] Cummings is a 57 y.o. male.  HPI     [name] Foley, MD, PhD [facility] [address] [address] [place], [place] [address]  Dear Vivia Ewing,   I saw your patient, [name] Cummings, upon your kind request in my sleep clinic today for initial consultation of his sleep disorder, in particular, concern for underlying obstructive sleep apnea. The patient is accompanied by his wife and daughter today. As you know, [name] Cummings is a 57 year old right-handed gentleman with a history of hypertension, hyperlipidemia, seizure event, reflux disease, anoxic brain injury, anxiety, memory loss, kidney stones, history of V. fib, ischemic cardiomyopathy, depression, coronary artery disease and obesity, who reports snoring and excessive daytime somnolence. His wife and daughter report apneic pauses while he is asleep. I reviewed your office note from 01/25/2018. His Epworth sleepiness score is 24 out of 24, fatigue score is 55 out of 63. He has 4 children, does not work, lives with wife and children and grandchildren. He does not drive, no longer works. He does not have a FHx of sleep apnea. No AM HAs, but has enuresis nightly. No recurrent Sz. He has a bedtime of around 11 PM and rise time is between 7 and 7:30. He is a nonsmoker and no longer chews tobacco for the past 12 years. He does not drink alcohol, he drinks caffeine in the form of sweet tea, several servings per day, up to 1 gallon.  His Past Medical History Is Significant For: Past Medical History:  Diagnosis Date  . Anxiety   . 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  . CAD (coronary artery disease)    a. 2007 Ant MI/VF Arrest/PCI LAD w/ BMS;  b. 07/2015 Cath/PCI: LM nl, LAD 80 ISR (2.5x28 Synergy DES), LCX min irregs, OM1 99, RCA 50p.  . Depression   . Dizziness   . Dysphagia, unspecified(787.20)   . GERD (gastroesophageal reflux disease)   . History of seizure disorder    . Hyperlipidemia, mixed   . Hypertensive heart disease    unspecified  . Ischemic cardiomyopathy    a. 06/2014 Echo: EF 60-65%.  . 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  . Nephrolithiasis   . 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    His Past Surgical History Is Significant For: Past Surgical History:  Procedure Laterality Date  . bedside tracheostomy     with #6 Shiley. 04/14/2006  . CARDIAC CATHETERIZATION  09/2005   with bare metal stent to the mid LAD (Minivision 2.5 x 18 mm) with Provisional balloon angioplasty to the second diagonal.  . CARDIAC CATHETERIZATION  03/2006   with stenting of the mid LAD with Taxus 2.5 x 20 mm and 2.5 x 8 mm stents.      Marland Kitchen CARDIAC CATHETERIZATION N/A 07/20/2015   Procedure: Left Heart Cath and Coronary Angiography;  Surgeon: Corky Crafts, MD;  Location: [facility] INVASIVE CV LAB;  Service: Cardiovascular;  Laterality: N/A;  . CARDIAC CATHETERIZATION N/A 07/20/2015   Procedure: Coronary Stent Intervention;  Surgeon: Corky Crafts, MD;  Location: [facility] INVASIVE CV LAB;  Service: Cardiovascular;  Laterality: N/A;  . CORONARY STENT INTERVENTION N/A 05/15/2017   Procedure: CORONARY STENT INTERVENTION;  Surgeon: Tonny Bollman, MD;  Location: [facility] INVAS[facility] CV LAB;  Service: Cardiovascular;  Laterality: N/A;  . CORONARY STENT PLACEMENT  07/20/2015  LAD with DES  . ESOPHAGOGASTRODUODENOSCOPY    . KIDNEY STONE SURGERY    . LEFT HEART CATH AND CORONARY ANGIOGRAPHY N/A 05/15/2017   Procedure: LEFT HEART CATH AND CORONARY ANGIOGRAPHY;  Surgeon: Tonny Bollman, MD;  Location: [facility] INVASIVE CV LAB;  Service: Cardiovascular;  Laterality: N/A;  . PEG TUBE PLACEMENT    . PEG TUBE REMOVAL    . stent thrombosis  03/2006   Complicated my sudden cardiac death  . TRACHEOSTOMY CLOSURE      His Family History Is Significant For: Family History  Problem Relation Age of Onset  . Heart attack Mother   . Diabetes Mother    . Hydrocephalus Mother   . Heart attack Father   . Cancer Father        unsure of type  . Diabetes Sister   . Coronary artery disease Neg Hx        Siblings    His Social History Is Significant For: Social History   Socioeconomic History  . Marital status: Married    Spouse name: Not on file  . Number of children: 4  . Years of education: 93  . Highest education level: Not on file  Occupational History  . Occupation: Disabled  Social Needs  . Financial resource strain: Not on file  . Food insecurity:    Worry: Not on file    Inability: Not on file  . Transportation needs:    Medical: Not on file    Non-medical: Not on file  Tobacco Use  . Smoking status: Never Smoker  . Smokeless tobacco: Former Neurosurgeon    Types: Chew  Substance and Sexual Activity  . Alcohol use: No    Alcohol/week: 0.0 standard drinks  . Drug use: No  . Sexual activity: Not on file  Lifestyle  . Physical activity:    Days per week: Not on file    Minutes per session: Not on file  . Stress: Not on file  Relationships  . Social connections:    Talks on phone: Not on file    Gets together: Not on file    Attends religious service: Not on file    Active member of club or organization: Not on file    Attends meetings of clubs or organizations: Not on file    Relationship status: Not on file  Other Topics Concern  . Not on file  Social History Narrative   Lives at home with wife.   Right-handed.   One soda and tea daily.    His Allergies Are:  Allergies  Allergen Reactions  . Hydromorphone Hcl Other (See Comments)    REACTION: Reaction not known  . Lorazepam Other (See Comments)    REACTION: Reaction not known  . Pseudoephedrine Other (See Comments)    REACTION: Reaction not known  . Terfenadine Other (See Comments)    REACTION: Reaction not known  . Atorvastatin Other (See Comments)    Pt c/o myalgias  :   His Current Medications Are:  Outpatient Encounter Medications as of  03/24/2018  Medication Sig  . aspirin EC 81 MG tablet Take 81 mg by mouth daily.  . carvedilol (COREG) 25 MG tablet Take 1 tablet (25 mg total) 2 (two) times daily with a meal by mouth.  . clopidogrel (PLAVIX) 75 MG tablet Take 1 tablet (75 mg total) daily by mouth. Same day PCI  . FLUoxetine (PROZAC) 20 MG capsule Take 20 mg by mouth at bedtime.   . fluticasone (  FLONASE) 50 MCG/ACT nasal spray Place 1 spray into both nostrils daily as needed for allergies.   . furosemide (LASIX) 20 MG tablet Take 1 tablet (20 mg total) daily as needed by mouth for fluid.  Marland Kitchen gabapentin (NEURONTIN) 100 MG capsule Take 100 mg by mouth daily.  Marland Kitchen ibuprofen (ADVIL,MOTRIN) 200 MG tablet Take 200 mg every 6 (six) hours as needed by mouth.  . loratadine (CLARITIN) 10 MG tablet Take 10 mg daily as needed by mouth for allergies.   . nitroGLYCERIN (NITROSTAT) 0.4 MG SL tablet Place 1 tablet (0.4 mg total) every 5 (five) minutes as needed under the tongue for chest pain.  . pantoprazole (PROTONIX) 40 MG tablet Take 1 tablet (40 mg total) daily by mouth.  . pravastatin (PRAVACHOL) 20 MG tablet Take 1 tablet (20 mg total) every evening by mouth.   No facility-administered encounter medications on file as of 03/24/2018.   :  Review of Systems:  Out of a complete 14 point review of systems, all are reviewed and negative with the exception of these symptoms as listed below: Review of Systems  Neurological:       Pt presents today to discuss his sleep. Pt has never had a sleep study but does endorse snoring.  Epworth Sleepiness Scale 0= would never doze 1= slight chance of dozing 2= moderate chance of dozing 3= high chance of dozing  Sitting and reading: 3 Watching TV: 3 Sitting inactive in a public place (ex. Theater or meeting): 3 As a passenger in a car for an hour without a break: 3 Lying down to rest in the afternoon: 3 Sitting and talking to someone: 3 Sitting quietly after lunch (no alcohol): 3 In a car,  while stopped in traffic: 3 Total: 24     Objective:  Neurological Exam  Physical Exam Physical Examination:   Vitals:   03/24/18 1606  BP: 128/76  Pulse: 63    General Examination: The patient is a very pleasant 57 y.o. male in no acute distress. He appears well-developed and well-nourished and well groomed. He is able to answer simple questions, but has some logorrhea, sometimes overly jocular. Wife provides most of the history and daughter supplements. He needs redirection.   HEENT: Normocephalic, atraumatic, pupils are equal, round and reactive to light and accommodation. Extraocular tracking is good without limitation to gaze excursion or nystagmus noted. Normal smooth pursuit is noted. Hearing is grossly intact. Face is symmetric with normal facial animation and normal facial sensation. Speech is clear with no dysarthria noted. Some throat clearing type sounds, tic-like. Oropharynx exam reveals: mild mouth dryness, poor dental hygiene with multiple missing teeth, and moderate airway crowding, due to smaller airway entry, tonsils in place, wider uvula. Mallampati is class II. Neck circumference is 16 in. Tongue protrudes centrally and palate elevates symmetrically.   Chest: Clear to auscultation without wheezing, rhonchi or crackles noted.  Heart: S1+S2+0, regular and normal without murmurs, rubs or gallops noted.   Abdomen: Soft, non-tender and non-distended with normal bowel sounds appreciated on auscultation.  Extremities: There is no pitting edema in the distal lower extremities bilaterally.   Skin: Warm and dry without trophic changes noted.  Musculoskeletal: exam reveals no obvious joint deformities, tenderness or joint swelling or erythema.   Neurologically:  Mental status: The patient is awake, alert and pays attention. His immediate and remote memory, attention, language skills and fund of knowledge are impaired. Needs frequent redirection. Mood is normal and affect is  at times overly  jocular.  Cranial nerves II - XII are as described above under HEENT exam.  Motor exam: Normal bulk, strength and tone is noted. There is no tremor. Fine motor skills and coordination: grossly intact.  Cerebellar testing: No dysmetria or intention tremor.  Sensory exam: intact to light touch in the upper and lower extremities.  Gait, station and balance: He stands without difficulty. No problems walking.   Assessment and Plan:   In summary, [name] Cummings is a 57 y.o.-year old male with a history of hypertension, hyperlipidemia, seizure event, reflux disease, anoxic brain injury, anxiety, memory loss, kidney stones, history of V. fib, ischemic cardiomyopathy, depression, coronary artery disease and obesity, whose history and physical exam are concerning for obstructive sleep apnea (OSA). I had a long chat with the patient his family about my findings and the diagnosis of OSA, its prognosis and treatment options. We talked about medical treatments, surgical interventions and non-pharmacological approaches. I explained in particular the risks and ramifications of untreated moderate to severe OSA, especially with respect to developing cardiovascular disease down the Road, including congestive heart failure, difficult to treat hypertension, cardiac arrhythmias, or stroke. Even type 2 diabetes has, in part, been linked to untreated OSA. Symptoms of untreated OSA include daytime sleepiness, memory problems, mood irritability and mood disorder such as depression and anxiety, lack of energy, as well as recurrent headaches, especially morning headaches. We talked about smoking cessation and trying to maintain a healthy lifestyle in general, as well as the importance of weight control. I encouraged the patient to eat healthy, exercise daily and keep well hydrated, to keep a scheduled bedtime and wake time routine, to not skip any meals and eat healthy snacks in between meals. I advised the patient  not to drive when feeling sleepy. Of note, he is no longer driving. I recommended the following at this time: home sleep testing.  I explained the sleep test procedure to the patient and also outlined possible surgical and non-surgical treatment options of OSA.  I also explained the CPAP treatment option to the patient, who indicated that he would be willing to try CPAP if the need arises. I explained the importance of being compliant with PAP treatment, not only for insurance purposes but primarily to improve His symptoms, and for the patient's long term health benefit, including to reduce His cardiovascular risks. I answered all their questions today and the patient and his family were in agreement. I plan to see him back after the sleep study is completed and encouraged them to call with any interim questions, concerns, problems or updates.   Thank you very much for allowing me to participate in the care of this nice patient. If I can be of any further assistance to you please do not hesitate to talk to me.   Sincerely,   [name] Foley, MD, PhD

## 2018-03-24 NOTE — Patient Instructions (Signed)
Thank you for choosing [facility] for your sleep related care! It was nice to meet you today! I appreciate that you entrust me with your sleep related healthcare concerns. I hope, I was able to address at least some of your concerns today, and that I can help you feel reassured and also get better.    Here is what we discussed today and what we came up with as our plan for you:    Based on your symptoms and your exam I believe you are at risk for obstructive sleep apnea (aka OSA), and I think we should proceed with a home sleep test to determine whether you do or do not have OSA and how severe it is. Even, if you have mild OSA, I may want you to consider treatment with CPAP, as treatment of even borderline or mild sleep apnea can result and improvement of symptoms such as sleep disruption, daytime sleepiness, nighttime bathroom breaks, restless leg symptoms, improvement of headache syndromes, even improved mood disorder.   Please remember, the long-term risks and ramifications of untreated moderate to severe obstructive sleep apnea are: increased Cardiovascular disease, including congestive heart failure, stroke, difficult to control hypertension, treatment resistant obesity, arrhythmias, especially irregular heartbeat commonly known as A. Fib. (atrial fibrillation); even type 2 diabetes has been linked to untreated OSA.   Sleep apnea can cause disruption of sleep and sleep deprivation in most cases, which, in turn, can cause recurrent headaches, problems with memory, mood, concentration, focus, and vigilance. Most people with untreated sleep apnea report excessive daytime sleepiness, which can affect their ability to drive. Please do not drive if you feel sleepy. Patients with sleep apnea developed difficulty initiating and maintaining sleep (aka insomnia).   Having sleep apnea may increase your risk for other sleep disorders, including involuntary behaviors sleep such as sleep  terrors, sleep talking, sleepwalking.    Having sleep apnea can also increase your risk for restless leg syndrome and leg movements at night.   Please note that untreated obstructive sleep apnea may carry additional perioperative morbidity. Patients with significant obstructive sleep apnea (typically, in the moderate to severe degree) should receive, if possible, perioperative PAP (positive airway pressure) therapy and the surgeons and particularly the anesthesiologists should be informed of the diagnosis and the severity of the sleep disordered breathing.   I will likely see you back after your sleep study to go over the test results and where to go from there. We will call you after your sleep study to advise about the results (most likely, you will hear from [place], my nurse) and to set up an appointment at the time, as necessary.    Our sleep lab administrative assistant will call you to schedule your sleep study and give you further instructions, regarding the check in process for the sleep study, arrival time, what to bring, when you can expect to leave after the study, etc., and to answer any other logistical questions you may have. If you don't hear back from her by about 2 weeks from now, please feel free to call her direct line at [phone] or you can call our general clinic number, or email us through My Chart.

## 2018-04-14 DIAGNOSIS — M546 Pain in thoracic spine: Secondary | ICD-10-CM | POA: Insufficient documentation

## 2018-04-16 ENCOUNTER — Other Ambulatory Visit: Payer: Self-pay | Admitting: Orthopedic Surgery

## 2018-04-16 DIAGNOSIS — S29011A Strain of muscle and tendon of front wall of thorax, initial encounter: Secondary | ICD-10-CM

## 2018-04-20 ENCOUNTER — Ambulatory Visit
Admission: RE | Admit: 2018-04-20 | Discharge: 2018-04-20 | Disposition: A | Discharge status: Home / Self Care | Payer: 59 | Source: Ambulatory Visit | Attending: Orthopedic Surgery | Admitting: Orthopedic Surgery

## 2018-04-20 DIAGNOSIS — S29011A Strain of muscle and tendon of front wall of thorax, initial encounter: Secondary | ICD-10-CM

## 2018-04-23 ENCOUNTER — Ambulatory Visit (INDEPENDENT_AMBULATORY_CARE_PROVIDER_SITE_OTHER): Payer: 59 | Admitting: Neurology

## 2018-04-23 DIAGNOSIS — R32 Unspecified urinary incontinence: Secondary | ICD-10-CM

## 2018-04-23 DIAGNOSIS — R0681 Apnea, not elsewhere classified: Secondary | ICD-10-CM

## 2018-04-23 DIAGNOSIS — G4733 Obstructive sleep apnea (adult) (pediatric): Secondary | ICD-10-CM

## 2018-04-23 DIAGNOSIS — R400 Somnolence: Secondary | ICD-10-CM

## 2018-04-23 DIAGNOSIS — R0683 Snoring: Secondary | ICD-10-CM

## 2018-04-26 ENCOUNTER — Telehealth: Payer: Self-pay

## 2018-04-26 NOTE — Progress Notes (Signed)
Patient referred by Dr. Terrace Arabia, seen by me on 03/24/18, HST on 04/25/18.   Please call and notify the patient that the recent home sleep test did not show any significant obstructive sleep apnea. His AHI was in the borderline abnormal range, and oxygen saturations remained at or above 90%. Treatment with positive airway pressure is not warranted. Weight loss and avoidance of the supine sleep position will likely suffice as treatment. Some snoring was noted.  Patient can follow up with the referring provider. Please remind patient to try to maintain good sleep hygiene, which means: Keep a regular sleep and wake schedule and make enough time for sleep (7 1/2 to 8 1/2 hours for the average adult), try not to exercise or have a meal within 2 hours of your bedtime, try to keep your bedroom conducive for sleep, that is, cool and dark, without light distractors such as an illuminated alarm clock, and refrain from watching TV right before sleep or in the middle of the night and do not keep the TV or radio on during the night. If a nightlight is used, have it away from the visual field. Also, try not to use or play on electronic devices at bedtime, such as your cell phone, tablet PC or laptop. If you like to read at bedtime on an electronic device, try to dim the background light as much as possible. Do not eat in the middle of the night. Keep pets away from the bedroom environment. For stress relief, try meditation, deep breathing exercises (there are many books and CDs available), a white noise machine or fan can help to diffuse other noise distractors, such as traffic noise. Do not drink alcohol before bedtime, as it can disturb sleep and cause middle of the night awakenings. Never mix alcohol and sedating medications! Avoid narcotic pain medication close to bedtime, as opioids/narcotics can suppress breathing drive and breathing effort.    Thanks,  Huston Foley, MD, PhD [facility])

## 2018-04-26 NOTE — Telephone Encounter (Signed)
I called pt to discuss his sleep study results. No answer, left a message asking him to call me back. 

## 2018-04-26 NOTE — Telephone Encounter (Signed)
-----   Message from [name] Foley, MD sent at 04/26/2018  3:08 PM EDT ----- Patient referred by Dr. Terrace Arabia, seen by me on 03/24/18, HST on 04/25/18.   Please call and notify the patient that the recent home sleep test did not show any significant obstructive sleep apnea. His AHI was in the borderline abnormal range, and oxygen saturations remained at or above 90%. Treatment with positive airway pressure is not warranted. Weight loss and avoidance of the supine sleep position will likely suffice as treatment. Some snoring was noted.  Patient can follow up with the referring provider. Please remind patient to try to maintain good sleep hygiene, which means: Keep a regular sleep and wake schedule and make enough time for sleep (7 1/2 to 8 1/2 hours for the average adult), try not to exercise or have a meal within 2 hours of your bedtime, try to keep your bedroom conducive for sleep, that is, cool and dark, without light distractors such as an illuminated alarm clock, and refrain from watching TV right before sleep or in the middle of the night and do not keep the TV or radio on during the night. If a nightlight is used, have it away from the visual field. Also, try not to use or play on electronic devices at bedtime, such as your cell phone, tablet PC or laptop. If you like to read at bedtime on an electronic device, try to dim the background light as much as possible. Do not eat in the middle of the night. Keep pets away from the bedroom environment. For stress relief, try meditation, deep breathing exercises (there are many books and CDs available), a white noise machine or fan can help to diffuse other noise distractors, such as traffic noise. Do not drink alcohol before bedtime, as it can disturb sleep and cause middle of the night awakenings. Never mix alcohol and sedating medications! Avoid narcotic pain medication close to bedtime, as opioids/narcotics can suppress breathing drive and breathing effort.     Thanks,  [name] Foley, MD, PhD [facility])

## 2018-04-26 NOTE — Procedures (Signed)
[facility] @[facility] [address] [place], [place] [address] NAME: [name] Cummings                                                                 DOB: December 29, 1960 MEDICAL RECORD NUMBER [id number]                                                DOS: 04/25/18  REFERRING PHYSICIAN: Levert Feinstein, MD STUDY PERFORMED: Home Sleep Test (repeat study from 04/11/18, on [facility]) HISTORY: 57 year old man with a history of hypertension, hyperlipidemia, seizure event, reflux disease, anoxic brain injury, anxiety, memory loss, kidney stones, history of V. fib, ischemic cardiomyopathy, depression, coronary artery disease and obesity, who reports snoring and excessive daytime somnolence. BMI: 34.5.  STUDY RESULTS:  Total Recording Time: 8 hours, 17 minutes (valid test time: 7 hours, 4 min) Total Apnea/Hypopnea Index (AHI):  5.5 /h        RDI:   8.5/h Average Oxygen Saturation:   95 %         Lowest Oxygen Desaturation: 90 %  Total Time Oxygen Saturation Below or at 88 %: 0 minutes  Average Heart Rate:    57 bpm (between 46 and 92 bpm) IMPRESSION: OSA, borderline RECOMMENDATION: This study does not demonstrate significant obstructive or central sleep disordered breathing. His AHI was in the borderline abnormal range, and oxygen saturations remained at or above 90%. Treatment with positive airway pressure is not warranted. Weight loss and avoidance of the supine sleep position will likely suffice as treatment. Some snoring was noted. Other causes of the patient's symptoms, including circadian rhythm disturbances, an underlying mood disorder, medication effect and/or an underlying medical problem cannot be ruled out based on this test. Clinical correlation is recommended. The patient should be cautioned not to drive, work at heights, or operate dangerous or heavy equipment when tired or sleepy. Review and reiteration of good sleep hygiene measures should be pursued with any patient. The patient can  follow up with his referring provider, who will be notified of the test results.   I certify that I have reviewed the raw data recording prior to the issuance of this report in accordance with the standards of the American Academy of Sleep Medicine (AASM).  Huston Foley, MD, PhD [facility]) Diplomat, ABPN (Neurology and Sleep)

## 2018-04-27 NOTE — Telephone Encounter (Signed)
Pt's wife, Eather Colas, per DPR, returned my call. I advised pt that Dr. Frances Furbish reviewed pt's sleep study and found that pt did not show any significant osa, his AHI was borderline abnormal, but O2 remained above 90%. CPAP is not warranted but Dr. Frances Furbish recommends weight loss and avoidance of the supine sleep position will likely suffice as treatment and some snoring was noted. Dr. Frances Furbish recommends that pt follow up with Dr. Terrace Arabia. I reviewed sleep hygiene recommendations with the pt's wife, including trying to keep a regular sleep wake schedule, avoiding electronics in the bedroom, keeping the bedroom cool, dark, and quiet, and avoiding eating or exercising within 2 hours of bedtime as well as eating in the middle of the night. I advised pt's wife to keep pets out of the bedroom. I discussed with pt's wife the importance of stress relief and to try meditation, deep breathing exercises, and/or a white noise machine or fan to diffuse other noise distractors. I advised pt's wife to not have pt drink alcohol before bedtime and to never mix alcohol and sedating medications. Pt's wife was advised that pt should avoid narcotic pain medication close to bedtime. I advised pt's wife that a copy of these sleep study results will be sent to Dr. Terrace Arabia. Pt's wife verbalized understanding of results. Pt's wife had no questions at this time but was encouraged to call back if questions arise.

## 2018-07-14 ENCOUNTER — Other Ambulatory Visit: Payer: Self-pay | Admitting: Nurse Practitioner

## 2018-07-14 DIAGNOSIS — N644 Mastodynia: Secondary | ICD-10-CM

## 2018-07-14 DIAGNOSIS — Z1231 Encounter for screening mammogram for malignant neoplasm of breast: Secondary | ICD-10-CM

## 2018-07-19 ENCOUNTER — Other Ambulatory Visit: Payer: Self-pay | Admitting: Internal Medicine

## 2018-07-19 ENCOUNTER — Ambulatory Visit
Admission: RE | Admit: 2018-07-19 | Discharge: 2018-07-19 | Disposition: A | Discharge status: Home / Self Care | Payer: 59 | Source: Ambulatory Visit | Attending: Nurse Practitioner | Admitting: Nurse Practitioner

## 2018-07-19 ENCOUNTER — Ambulatory Visit (INDEPENDENT_AMBULATORY_CARE_PROVIDER_SITE_OTHER): Payer: 59 | Admitting: Neurology

## 2018-07-19 ENCOUNTER — Encounter: Payer: Self-pay | Admitting: Neurology

## 2018-07-19 ENCOUNTER — Other Ambulatory Visit: Payer: Self-pay | Admitting: Surgery

## 2018-07-19 VITALS — BP 122/62 | HR 64 | Ht 73.5 in | Wt 254.0 lb

## 2018-07-19 DIAGNOSIS — N644 Mastodynia: Secondary | ICD-10-CM

## 2018-07-19 DIAGNOSIS — G931 Anoxic brain damage, not elsewhere classified: Secondary | ICD-10-CM | POA: Diagnosis not present

## 2018-07-19 NOTE — Progress Notes (Signed)
PATIENT: [name] Cummings DOB: 12/31/1960  Chief Complaint  Patient presents with  . Hx of anoxic brain injury    MMSE 19/30 - 11 animals.  He is here with his wife, [name] Cummings and daughter, [name] Cummings. His wife says he has good and bad days with his memory.  . Breast Pain    He has continued having left breast pain.  He just had a normal ultrasound and mammogram.      HISTORICAL  I have reviewed and summarized the referring note from the referring physician.   [name] Cummings 58 years old right-handed male, seen in refer by his primary care physician [name] Cummings, Fred H, MD  Dr. Doristine CounterBurnett for evaluation of left-sided weakness and pain, chronic memory loss, he is accompanied by his wife, and mother-in-law at today's clinical visit.  He suffered heart attack October 2007, ventricular fibrillation, suffered prolonged anoxic brain injury, had persistent memory loss ever since. He also had a history of hypertension, hyperlipidemia, he had stent for his coronary artery disease. Most recent was stent to left anterior descending in January 2017.  He had 14 years of education, worked as a Scientist, water qualitybrick mason.  He complained of left-sided discomfort since August 06 2015, initially presented with difficulty breathing, nausea or diarrhea, was taken to the hospital, have reviewed the laboratory, negative troponin, normal CBC with exception of mild anemia hemoglobin 12.4, normal CMP.  Over the past few days, he continue complains of left-sided numbness tingling mainly involving his left arm, no gait difficulty, no left arm weakness, left chest discomfort strip-like spreading from left breast to left lower abdomen.   At baseline, patient can carry on simple conversation, helping with her laundry, dressing, bathing, eating, toileting independently no trouble  UPDATE September 19 2015: He continue to complains of intermittent left-sided discomfort, he has difficulty to elaborate on details, per wife, he had  extensive GI, cardiac evaluation, failed to demonstrate etiology   We have reviewed MRI of the brain without contrast in March 2017 in comparison to multiple previous CAT scans, and MRI of the brain in 2008, there is evidence of mild progression of generalized brain atrophy, ventriculomegaly, no evidence of obstruction, mild supratentorium small vessel disease.  EEG in April 2017: Abnormal, secondary to intermittent theta slowing from the left hemisphere, there was no clear epileptiform discharge,  UPDATE January 25 2018: He had cardiac catheterization November 2018, severe ostial left circumflex stenosis was treated with drug eluting stent, moderate diffuse in-stent stenosis of the LAD, severe stenosis of RCA ostium, Echocardiogram in November 2018, ejection fraction 50 to 55%, hypokinesis of apical anterior, apical inferior, and apical myocardium  Since March 2019, he complains of intermittent dizziness, lightheaded spells, in addition, he developed obsession of picking up sticks, tree limbs, he had worsening excessive daytime sleepiness, snoring, woke up choking from sleep sometimes, suggestive of obstructive sleep apnea  UPDATE January 2020: He continues to have left sided breast pain. He does still report dizziness when he is standing in the bathroom. He did have his ears cleaned out in august and it did help his dizziness. He still snores, had a normal sleep evaluation. He has gotten less obsessed with picking up sticks. Has no new concerns. No falls. He presents for evaluation with his wife, and granddaughter.    REVIEW OF SYSTEMS: Full 14 system review of systems performed and notable only for loss of vision, cough, daytime sleepiness, snoring, incontinence of bladder, memory loss, dizziness.  All other review of systems  were negative.  ALLERGIES: Allergies  Allergen Reactions  . Hydromorphone Hcl Other (See Comments)    REACTION: Reaction not known  . Lorazepam Other (See Comments)      REACTION: Reaction not known  . Pseudoephedrine Other (See Comments)    Caused seizure  . Terfenadine Other (See Comments)    REACTION: Reaction not known  . Atorvastatin Other (See Comments)    Pt c/o myalgias    HOME MEDICATIONS: Current Outpatient Medications  Medication Sig Dispense Refill  . aspirin EC 81 MG tablet Take 81 mg by mouth daily.    . carvedilol (COREG) 25 MG tablet Take 1 tablet (25 mg total) 2 (two) times daily with a meal by mouth. 180 tablet 3  . clopidogrel (PLAVIX) 75 MG tablet Take 1 tablet (75 mg total) daily by mouth. Same day PCI 90 tablet 3  . FLUoxetine (PROZAC) 20 MG capsule Take 20 mg by mouth at bedtime.     . fluticasone (FLONASE) 50 MCG/ACT nasal spray Place 1 spray into both nostrils daily as needed for allergies.     . furosemide (LASIX) 20 MG tablet Take 1 tablet (20 mg total) daily as needed by mouth for fluid. 30 tablet 3  . HYDROcodone-acetaminophen (NORCO/VICODIN) 5-325 MG tablet TAKE 1 TABLET BY MOUTH 4 TIMES DAILY AS NEEDED    . loratadine (CLARITIN) 10 MG tablet Take 10 mg daily as needed by mouth for allergies.     . nitroGLYCERIN (NITROSTAT) 0.4 MG SL tablet Place 1 tablet (0.4 mg total) every 5 (five) minutes as needed under the tongue for chest pain. 25 tablet 3  . pantoprazole (PROTONIX) 40 MG tablet Take 1 tablet (40 mg total) daily by mouth. 90 tablet 3  . pravastatin (PRAVACHOL) 20 MG tablet Take 1 tablet (20 mg total) every evening by mouth. 90 tablet 3   No current facility-administered medications for this visit.     PAST MEDICAL HISTORY: Past Medical History:  Diagnosis Date  . Anxiety   . 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  . CAD (coronary artery disease)    a. 2007 Ant MI/VF Arrest/PCI LAD w/ BMS;  b. 07/2015 Cath/PCI: LM nl, LAD 80 ISR (2.5x28 Synergy DES), LCX min irregs, OM1 99, RCA 50p.  . Depression   . Dizziness   . Dysphagia, unspecified(787.20)   . GERD (gastroesophageal reflux  disease)   . History of seizure disorder   . Hyperlipidemia, mixed   . Hypertensive heart disease    unspecified  . Ischemic cardiomyopathy    a. 06/2014 Echo: EF 60-65%.  . 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  . Nephrolithiasis   . 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    PAST SURGICAL HISTORY: Past Surgical History:  Procedure Laterality Date  . bedside tracheostomy     with #6 Shiley. 04/14/2006  . CARDIAC CATHETERIZATION  09/2005   with bare metal stent to the mid LAD (Minivision 2.5 x 18 mm) with Provisional balloon angioplasty to the second diagonal.  . CARDIAC CATHETERIZATION  03/2006   with stenting of the mid LAD with Taxus 2.5 x 20 mm and 2.5 x 8 mm stents.      Marland Kitchen CARDIAC CATHETERIZATION N/A 07/20/2015   Procedure: Left Heart Cath and Coronary Angiography;  Surgeon: Corky Crafts, MD;  Location: [facility] INVASIVE CV LAB;  Service: Cardiovascular;  Laterality: N/A;  .  CARDIAC CATHETERIZATION N/A 07/20/2015   Procedure: Coronary Stent Intervention;  Surgeon: Corky CraftsJayadeep S Varanasi, MD;  [facility];  Service: Cardiovascular;  Laterality: N/A;  . CORONARY STENT INTERVENTION N/A 05/15/2017   Procedure: CORONARY STENT INTERVENTION;  Surgeon: [name], [name], MD;  [facility];  Service: Cardiovascular;  Laterality: N/A;  . CORONARY STENT PLACEMENT  07/20/2015   LAD with DES  . ESOPHAGOGASTRODUODENOSCOPY    . KIDNEY STONE SURGERY    . LEFT HEART CATH AND CORONARY ANGIOGRAPHY N/A 05/15/2017   Procedure: LEFT HEART CATH AND CORONARY ANGIOGRAPHY;  Surgeon: [name], [name], MD;  [facility];  Service: Cardiovascular;  Laterality: N/A;  . PEG TUBE PLACEMENT    . PEG TUBE REMOVAL    . stent thrombosis  03/2006   Complicated my sudden cardiac death  . TRACHEOSTOMY CLOSURE      FAMILY HISTORY: Family History  Problem Relation Age of Onset  . Heart attack Mother   . Diabetes Mother   .  Hydrocephalus Mother   . Heart attack Father   . Cancer Father        unsure of type  . Diabetes Sister   . Coronary artery disease Neg Hx        Siblings    SOCIAL HISTORY: Social History   Socioeconomic History  . Marital status: Married    Spouse name: Not on file  . Number of children: 4  . Years of education: 7412  . Highest education level: Not on file  Occupational History  . Occupation: Disabled  Social Needs  . Financial resource strain: Not on file  . Food insecurity:    Worry: Not on file    Inability: Not on file  . Transportation needs:    Medical: Not on file    Non-medical: Not on file  Tobacco Use  . Smoking status: Never Smoker  . Smokeless tobacco: Former NeurosurgeonUser    Types: Chew  Substance and Sexual Activity  . Alcohol use: No    Alcohol/week: 0.0 standard drinks  . Drug use: No  . Sexual activity: Not on file  Lifestyle  . Physical activity:    Days per week: Not on file    Minutes per session: Not on file  . Stress: Not on file  Relationships  . Social connections:    Talks on phone: Not on file    Gets together: Not on file    Attends religious service: Not on file    Active member of club or organization: Not on file    Attends meetings of clubs or organizations: Not on file    Relationship status: Not on file  . Intimate partner violence:    Fear of current or ex partner: Not on file    Emotionally abused: Not on file    Physically abused: Not on file    Forced sexual activity: Not on file  Other Topics Concern  . Not on file  Social History Narrative   Lives at home with wife.   Right-handed.   One soda and tea daily.     PHYSICAL EXAM   Vitals:   07/19/18 1556  BP: 122/62  Pulse: 64  Weight: 254 lb (115.2 kg)  Height: 6' 1.5" (1.867 m)    Not recorded      Body mass index is 33.06 kg/m.  PHYSICAL EXAMNIATION:  Gen: NAD, conversant, well nourised, obese, well groomed  Cardiovascular: Regular rate  rhythm, no peripheral edema, warm, nontender. Eyes: Conjunctivae clear without exudates or hemorrhage Neck: Supple, no carotid bruits. Pulmonary: Clear to auscultation bilaterally   NEUROLOGICAL EXAM:  MENTAL STATUS: Speech:    Speech is normal; fluent and spontaneous with normal comprehension.  Cognition:     Orientation to time, place and person     Normal recent and remote memory     Normal Attention span and concentration     Normal Language, naming, repeating,spontaneous speech     Fund of knowledge   CRANIAL NERVES: CN II: Visual fields are full to confrontation. Pupils are round equal and briskly reactive to light. CN III, IV, VI: extraocular movement are normal. No ptosis. CN V: Facial sensation is intact to pinprick in all 3 divisions bilaterally. Corneal responses are intact.  CN VII: Face is symmetric with normal eye closure and smile. CN IX, X: Palate elevates symmetrically. Phonation is normal. CN XI: Head turning and shoulder shrug are intact CN XII: Tongue is midline with normal movements and no atrophy.  MOTOR: There is no pronator drift of out-stretched arms. Muscle bulk and tone are normal. Muscle strength is normal.  REFLEXES: Reflexes are 2+ and symmetric at the biceps, triceps, knees, and ankles. Plantar responses are flexor.  SENSORY: Intact to light touch, pinprick.  COORDINATION: Rapid alternating movements and fine finger movements are intact. There is no dysmetria on finger-to-nose and heel-knee-shin.    GAIT/STANCE: Posture is normal. Gait is unsteady with normal steps, base, arm swing, and turning. Heel and toe walking are normal. Difficulty with tandem walking.  Romberg is absent.   DIAGNOSTIC DATA (LABS, IMAGING, TESTING) - I reviewed patient records, labs, notes, testing and imaging myself where available.   ASSESSMENT AND PLAN  JAHARRI MLYNARCZYK is a 58 y.o. male   Anoxic Brain Damage, memory loss  MMSE 19/30-11 animals  Overall,  stable, and managing well at home with his wife  Dizziness  Improved since last visit, mostly after seeing ENT and having his ears cleaned out; ear canals mostly clear today, with some cerumen buildup in left canal  Carotid US in August 2019 showed no significant abnormality (less than 39% stenosis bilaterally)  He will follow-up on an as needed basis with our office.    I saw patient and reviewed history, note together with my nurse practitioner Hessie Diener, M.D. Ph.D.  [facility] [address], [address] [place], [place] [address] Ph: [phone] Fax: [phone]  CC: Referring Provider

## 2018-07-29 ENCOUNTER — Encounter: Payer: Self-pay | Admitting: Cardiology

## 2018-07-29 ENCOUNTER — Ambulatory Visit (INDEPENDENT_AMBULATORY_CARE_PROVIDER_SITE_OTHER): Payer: 59 | Admitting: Cardiology

## 2018-07-29 VITALS — BP 124/82 | HR 74 | Ht 71.0 in | Wt 251.0 lb

## 2018-07-29 DIAGNOSIS — R079 Chest pain, unspecified: Secondary | ICD-10-CM

## 2018-07-29 DIAGNOSIS — R0602 Shortness of breath: Secondary | ICD-10-CM

## 2018-07-29 DIAGNOSIS — I2510 Atherosclerotic heart disease of native coronary artery without angina pectoris: Secondary | ICD-10-CM | POA: Diagnosis not present

## 2018-07-29 NOTE — Progress Notes (Signed)
Clinical Summary [name] is a 58 y.o.male seen today for follow up of the following medical problems.   1. CAD - hx of BMS to LAD in 2007, followed by stent thrombosis with resulting VF arrest. LAD was restented at that time. - cath 2010 LAD 50% prox, 30% ISR in mid LAD, distal LAD 70-80%, LCX 40% ostial, 70-80% narrowing proximal portion LCX in the ramus [name] Cummings, RCA 70-80% proximal. LVEF 45%, the distal LAD was stented.  - cath Jan 2017 severe ISR of LAD stent, received another DES. Small OM1 subtotally occluded with left to left collaterals - echo Jan 2016 LVEF 60-65% - seen in [facility] 08/2015 with atypical chest pain, no evidence of ACS. Imdur was added.     04/2017 nuclear stress: anterolateral infarct with mild peri-infarct ischemia. Large lateral/inferolateral/inferior/inferioapical defect with moderate ischemia. LVEF 30-44% by nuclear.  - 04/2017 echo LVEF 50-55%, multiple WMAs  -04/2017 cath severe LCX ostial disease, received DES. Moderate ISR of LAD stent. Severe ostial disease of small nondominant RCA.   - some recent chest pain and SOB. Pain is similar to his long history of left sided pain. His SOB/DOE his family reports has progressed recently.     2. 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 Patient is not able to give description of symptoms, symptoms are all relayed through his wife.      Past Medical History:  Diagnosis Date  . Anxiety   . 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  . CAD (coronary artery disease)    a. 2007 Ant MI/VF Arrest/PCI LAD w/ BMS;  b. 07/2015 Cath/PCI: LM nl, LAD 80 ISR (2.5x28 Synergy DES), LCX min irregs, OM1 99, RCA 50p.  . Depression   . Dizziness   . Dysphagia, unspecified(787.20)   . GERD (gastroesophageal reflux disease)   . History of seizure disorder   . Hyperlipidemia, mixed   . Hypertensive heart disease    unspecified  . Ischemic cardiomyopathy    a. 06/2014 Echo: EF 60-65%.  . 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  . Nephrolithiasis   . 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     Allergies  Allergen Reactions  . Hydromorphone Hcl Other (See Comments)    REACTION: Reaction not known  . Lorazepam Other (See Comments)    REACTION: Reaction not known  . Pseudoephedrine Other (See Comments)    Caused seizure  . Terfenadine Other (See Comments)    REACTION: Reaction not known  . Atorvastatin Other (See Comments)    Pt c/o myalgias     Current Outpatient Medications  Medication Sig Dispense Refill  . aspirin EC 81 MG tablet Take 81 mg by mouth daily.    . carvedilol (COREG) 25 MG tablet Take 1 tablet (25 mg total) 2 (two) times daily with a meal by mouth. 180 tablet 3  . clopidogrel (PLAVIX) 75 MG tablet Take 1 tablet (75 mg total) daily by mouth. Same day PCI 90 tablet 3  . FLUoxetine (PROZAC) 20 MG capsule Take 20 mg by mouth at bedtime.     . fluticasone (FLONASE) 50 MCG/ACT nasal spray Place 1 spray into both nostrils daily as needed for allergies.     . furosemide (LASIX) 20 MG tablet Take 1 tablet (20 mg total) daily as needed by mouth for fluid. 30 tablet 3  . HYDROcodone-acetaminophen (  NORCO/VICODIN) 5-325 MG tablet TAKE 1 TABLET BY MOUTH 4 TIMES DAILY AS NEEDED    . loratadine (CLARITIN) 10 MG tablet Take 10 mg daily as needed by mouth for allergies.     . nitroGLYCERIN (NITROSTAT) 0.4 MG SL tablet Place 1 tablet (0.4 mg total) every 5 (five) minutes as needed under the tongue for chest pain. 25 tablet 3  . pantoprazole (PROTONIX) 40 MG tablet Take 1 tablet (40 mg total) daily by mouth. 90 tablet 3  . pravastatin (PRAVACHOL) 20 MG tablet Take 1 tablet (20 mg total) every evening by mouth. 90 tablet 3   No current facility-administered medications for this visit.      Past Surgical History:  Procedure Laterality Date  . bedside tracheostomy     with #6 Shiley. 04/14/2006    . CARDIAC CATHETERIZATION  09/2005   with bare metal stent to the mid LAD (Minivision 2.5 x 18 mm) with Provisional balloon angioplasty to the second diagonal.  . CARDIAC CATHETERIZATION  03/2006   with stenting of the mid LAD with Taxus 2.5 x 20 mm and 2.5 x 8 mm stents.      Marland Kitchen CARDIAC CATHETERIZATION N/A 07/20/2015   Procedure: Left Heart Cath and Coronary Angiography;  Surgeon: Corky Crafts, MD;  Location: [facility] INVASIVE CV LAB;  Service: Cardiovascular;  Laterality: N/A;  . CARDIAC CATHETERIZATION N/A 07/20/2015   Procedure: Coronary Stent Intervention;  Surgeon: Corky Crafts, MD;  Location: [facility] INVASIVE CV LAB;  Service: Cardiovascular;  Laterality: N/A;  . CORONARY STENT INTERVENTION N/A 05/15/2017   Procedure: CORONARY STENT INTERVENTION;  Surgeon: Tonny Bollman, MD;  Location: [facility] INVASIVE CV LAB;  Service: Cardiovascular;  Laterality: N/A;  . CORONARY STENT PLACEMENT  07/20/2015   LAD with DES  . ESOPHAGOGASTRODUODENOSCOPY    . KIDNEY STONE SURGERY    . LEFT HEART CATH AND CORONARY ANGIOGRAPHY N/A 05/15/2017   Procedure: LEFT HEART CATH AND CORONARY ANGIOGRAPHY;  Surgeon: Tonny Bollman, MD;  Location: [facility] INVASIVE CV LAB;  Service: Cardiovascular;  Laterality: N/A;  . PEG TUBE PLACEMENT    . PEG TUBE REMOVAL    . stent thrombosis  03/2006   Complicated my sudden cardiac death  . TRACHEOSTOMY CLOSURE       Allergies  Allergen Reactions  . Hydromorphone Hcl Other (See Comments)    REACTION: Reaction not known  . Lorazepam Other (See Comments)    REACTION: Reaction not known  . Pseudoephedrine Other (See Comments)    Caused seizure  . Terfenadine Other (See Comments)    REACTION: Reaction not known  . Atorvastatin Other (See Comments)    Pt c/o myalgias      Family History  Problem Relation Age of Onset  . Heart attack Mother   . Diabetes Mother   . Hydrocephalus Mother   . Heart attack Father   . Cancer Father        unsure of type  . Diabetes Sister    . Coronary artery disease Neg Hx        Siblings     Social History [name] Cummings reports that he has never smoked. He quit smokeless tobacco use about 12 years ago.  His smokeless tobacco use included chew. Mr. Glew reports no history of alcohol use.   Review of Systems CONSTITUTIONAL: No weight loss, fever, chills, weakness or fatigue.  HEENT: Eyes: No visual loss, blurred vision, double vision or yellow sclerae.No hearing loss, sneezing, congestion, runny nose or sore throat.  SKIN: No rash or itching.  CARDIOVASCULAR: per hpi RESPIRATORY: per hpi GASTROINTESTINAL: No anorexia, nausea, vomiting or diarrhea. No abdominal pain or blood.  GENITOURINARY: No burning on urination, no polyuria NEUROLOGICAL: No headache, dizziness, syncope, paralysis, ataxia, numbness or tingling in the extremities. No change in bowel or bladder control.  MUSCULOSKELETAL: No muscle, back pain, joint pain or stiffness.  LYMPHATICS: No enlarged nodes. No history of splenectomy.  PSYCHIATRIC: No history of depression or anxiety.  ENDOCRINOLOGIC: No reports of sweating, cold or heat intolerance. No polyuria or polydipsia.  Marland Kitchen   Physical Examination Vitals:   07/29/18 1555  BP: 124/82  Pulse: 74  SpO2: 96%   Vitals:   07/29/18 1555  Weight: 251 lb (113.9 kg)  Height: 5\' 11"  (1.803 m)    Gen: resting comfortably, no acute distress HEENT: no scleral icterus, pupils equal round and reactive, no palptable cervical adenopathy,  CV: RRR, no m/rg, no jvd Resp: Clear to auscultation bilaterally GI: abdomen is soft, non-tender, non-distended, normal bowel sounds, no hepatosplenomegaly MSK: extremities are warm, no edema.  Skin: warm, no rash Neuro:  no focal deficits Psych: appropriate affect   Diagnostic Studies  11/2008 Cath RESULTS: Left main coronary artery: The left main coronary artery was free of significant disease.  Left anterior descending: The left anterior descending artery gave  rise to small diagonal Chava Dulac and small septal perforator, and larger diagonal Hensley Aziz. There was 30% narrowing within the midportion of the previously placed stents. Distal to the stent, there was an eccentric lesion, which appeared to be a ruptured plaque, which was estimated to be 70-80% obstructive. There also was 40-50% diffuse narrowing in the proximal LAD.  Circumflex artery: The circumflex artery gave rise to a large ramus Nikholas Geffre and two posterolateral branches and a posterior descending Charistopher Rumble. This was a dominant vessel. There was 70-80% narrowing in the proximal portion in the ramus Jeannetta Cerutti which was extended over about 18 mm. There was also a 40% ostial narrowing in the circumflex artery and 50% narrowing in the ostium of the ramus Ruey Storer.  Right coronary artery: The right coronary artery was a nondominant vessel which supplied only right ventricular branches. There was a 70- 80% proximal stenosis.  Left ventriculogram: The left ventriculogram was performed in the RAO projection showed hypokinesis of the anterolateral wall and apex. The estimated fraction was 45%.  Following stenting of the lesion in the mid LAD, the stenosis improved from 80% to 0%.  CONCLUSIONS: 1. Coronary artery disease, status post previous percutaneous coronary  interventions as described above with 40% narrowing in the proximal  LAD, 30% narrowing within the stent in the mid LAD, and 80%  narrowing in the mid LAD after the stent, 70-80% narrowing in the  large ramus Reine Bristow of the circumflex artery with 40% ostial  stenosis in the dominant circumflex artery, and 80% narrowing in  the proximal portion of a nondominant right coronary artery with  anterolateral wall and apical wall hypokinesis and estimated  ejection fraction of 45%. 2. Successful PCI of the lesion in the mid LAD distal to the  previously placed stent using a  XIENCE drug-eluting stent with  improvement in center narrowing from 80% to 0%.  DISPOSITION: The patient returned to [facility] room for further observation.    Jan 2016 echo Study Conclusions  - Left ventricle: The cavity size was normal. Wall thickness was normal. Systolic function was normal. The estimated ejection fraction was in the range of 60% to 65%. Indeterminant diastolic function. -  Aortic valve: Mildly calcified annulus. Mildly thickened leaflets. - Mitral valve: Mildly calcified annulus. Mildly thickened leaflets . - Left atrium: The atrium was mildly dilated. - Systemic veins: IVC is small, suggestive of low RA pressure and hypovolemia. - Technically adequate study.   07/10/15 Clinic EKG (performed and reviewed in clinic): Sinus bradycardia  Jan 2017 Lexiscan MPI  Perfusion Summary Defect 1:  There is a large defect of moderate severity present in the basal anterolateral, mid anterolateral and apical lateral location. The defect is partially reversible. Large, moderate intensity, partially reversible anterolateral defect consistent with scar and at least moderate peri-infarct ischemia.    Overall Study Impression Myocardial perfusion is abnormal. This is a high risk study. Overall left ventricular systolic function was abnormal. Nuclear stress EF: 43%.      Jan 2017 cath  OM1 subtotally occluded with left to left collaterals coming from the apical LAD.  Severe in-stent restenosis of prior bare-metal stent which was placed in 2007 in the setting of his cardiac arrest. This was treated with cutting balloon angioplasty anddrug-eluting stent placement with a 2.5 x 28 Synergy drug-eluting stent, postdilatto greater than 3 m  Continue dual antiplatelet therapy indefinitely. The patient has significant memory loss after his cardiac arrest. Will discuss with his wife about the importance of staying on his dual antiplatelet therapy.    He'll be watched overnight with anticipated discharge tomorrow  04/2017 Nuclear stress  There was no ST segment deviation noted during stress.  Findings consistent with prior anterolateral myocardial infarction with mild peri-infarct ischemia. Large lateral/inferolatera/inferior/inferoapical defect with moderate ischemia  This is a high risk study. High risk due to low ejection fraction and multiple areas of ischemia. Mild area of ischemia anterolateral. Moderate area of ischemia lateral/inferior walls  The left ventricular ejection fraction is moderately decreased (30-44%).   04/2017 cath 1. Severe ostial left circumflex stenosis treated successfully with drug-eluting stent implantation (3.5 x 28 mm Sierra DES postdilated with a 4.0 mm noncompliant balloon) 2. Moderate diffuse in-stent restenosis of the LAD 3. Severe stenosis of the RCA ostium (nondominant vessel) 4. Normal LVEDP  Continue long-term dual antiplatelet therapy with aspirin and clopidogrel. As long as no complications arise patient is eligible for same day discharge.  04/2017 echo Study Conclusions  - Left ventricle: The cavity size was normal. Wall thickness was increased in a pattern of mild LVH. Systolic function was normal. The estimated ejection fraction was in the range of 50% to 55%. Left ventricular diastolic function parameters were normal. - Regional wall motion abnormality: Hypokinesis of the apical anterior, apical inferior, and apical myocardium. - Aortic valve: Valve area (VTI): 3.01 cm^2. Valve area (Vmax): 3.29 cm^2. - Technically difficult study.    Assessment and Plan   1. CAD/Chest pain/SOB -recent chest pain and SOB/DOE - evaluation has always been complicated by his severe memory deficit as well as chronic left sided chest pain combined with his extensive prior history of CAD - we will obtain an echo to evaluate for any new cardiac dysfunction, pending results  likely plan for lexiscan   2. Hyperlipidemia - he will conitnue statin       Antoine PocheJonathan F. Lance Galas, M.D.

## 2018-07-29 NOTE — Patient Instructions (Signed)

## 2018-07-30 ENCOUNTER — Ambulatory Visit: Payer: 59 | Admitting: Nurse Practitioner

## 2018-08-13 ENCOUNTER — Ambulatory Visit (HOSPITAL_COMMUNITY)
Admission: RE | Admit: 2018-08-13 | Discharge: 2018-08-13 | Disposition: A | Discharge status: Home / Self Care | Payer: 59 | Source: Ambulatory Visit | Attending: Cardiology | Admitting: Cardiology

## 2018-08-13 DIAGNOSIS — R079 Chest pain, unspecified: Secondary | ICD-10-CM | POA: Insufficient documentation

## 2018-08-13 NOTE — Progress Notes (Signed)
*  PRELIMINARY RESULTS* Echocardiogram 2D Echocardiogram has been performed.  Stacey Drain 08/13/2018, 1:41 PM

## 2018-08-20 ENCOUNTER — Telehealth: Payer: Self-pay

## 2018-08-20 DIAGNOSIS — R079 Chest pain, unspecified: Secondary | ICD-10-CM

## 2018-08-20 NOTE — Telephone Encounter (Addendum)
-----   Message from Antoine Poche, MD sent at 08/19/2018  6:54 PM EST ----- Echo looks fine, normal heart function. Please obtain a lexiscan for chest pain Order placed.   Dominga Ferry MD

## 2018-08-20 NOTE — Telephone Encounter (Signed)
-----   Message from Jonathan F Branch, MD sent at 08/19/2018  6:54 PM EST ----- Echo looks fine, normal heart function. Please obtain a lexiscan for chest pain   J Branch MD 

## 2018-08-20 NOTE — Telephone Encounter (Signed)
Called pt. No answer, left message for pt's wife to return call.

## 2018-08-23 ENCOUNTER — Telehealth: Payer: Self-pay | Admitting: *Deleted

## 2018-08-23 ENCOUNTER — Encounter: Payer: Self-pay | Admitting: *Deleted

## 2018-08-23 DIAGNOSIS — R079 Chest pain, unspecified: Secondary | ICD-10-CM

## 2018-08-23 NOTE — Telephone Encounter (Signed)
-----   Message from Antoine Poche, MD sent at 08/19/2018  6:54 PM EST ----- Echo looks fine, normal heart function. Please obtain a lexiscan for chest pain   Dominga Ferry MD

## 2018-08-24 IMAGING — DX DG CHEST 2V
2 series · 2 of 2 positions shown · non-contrast
Comparison: August 10, 2015

CLINICAL DATA: Preoperative evaluation.  Left chest swelling

EXAM:
CHEST  2 VIEW

[chest pa]
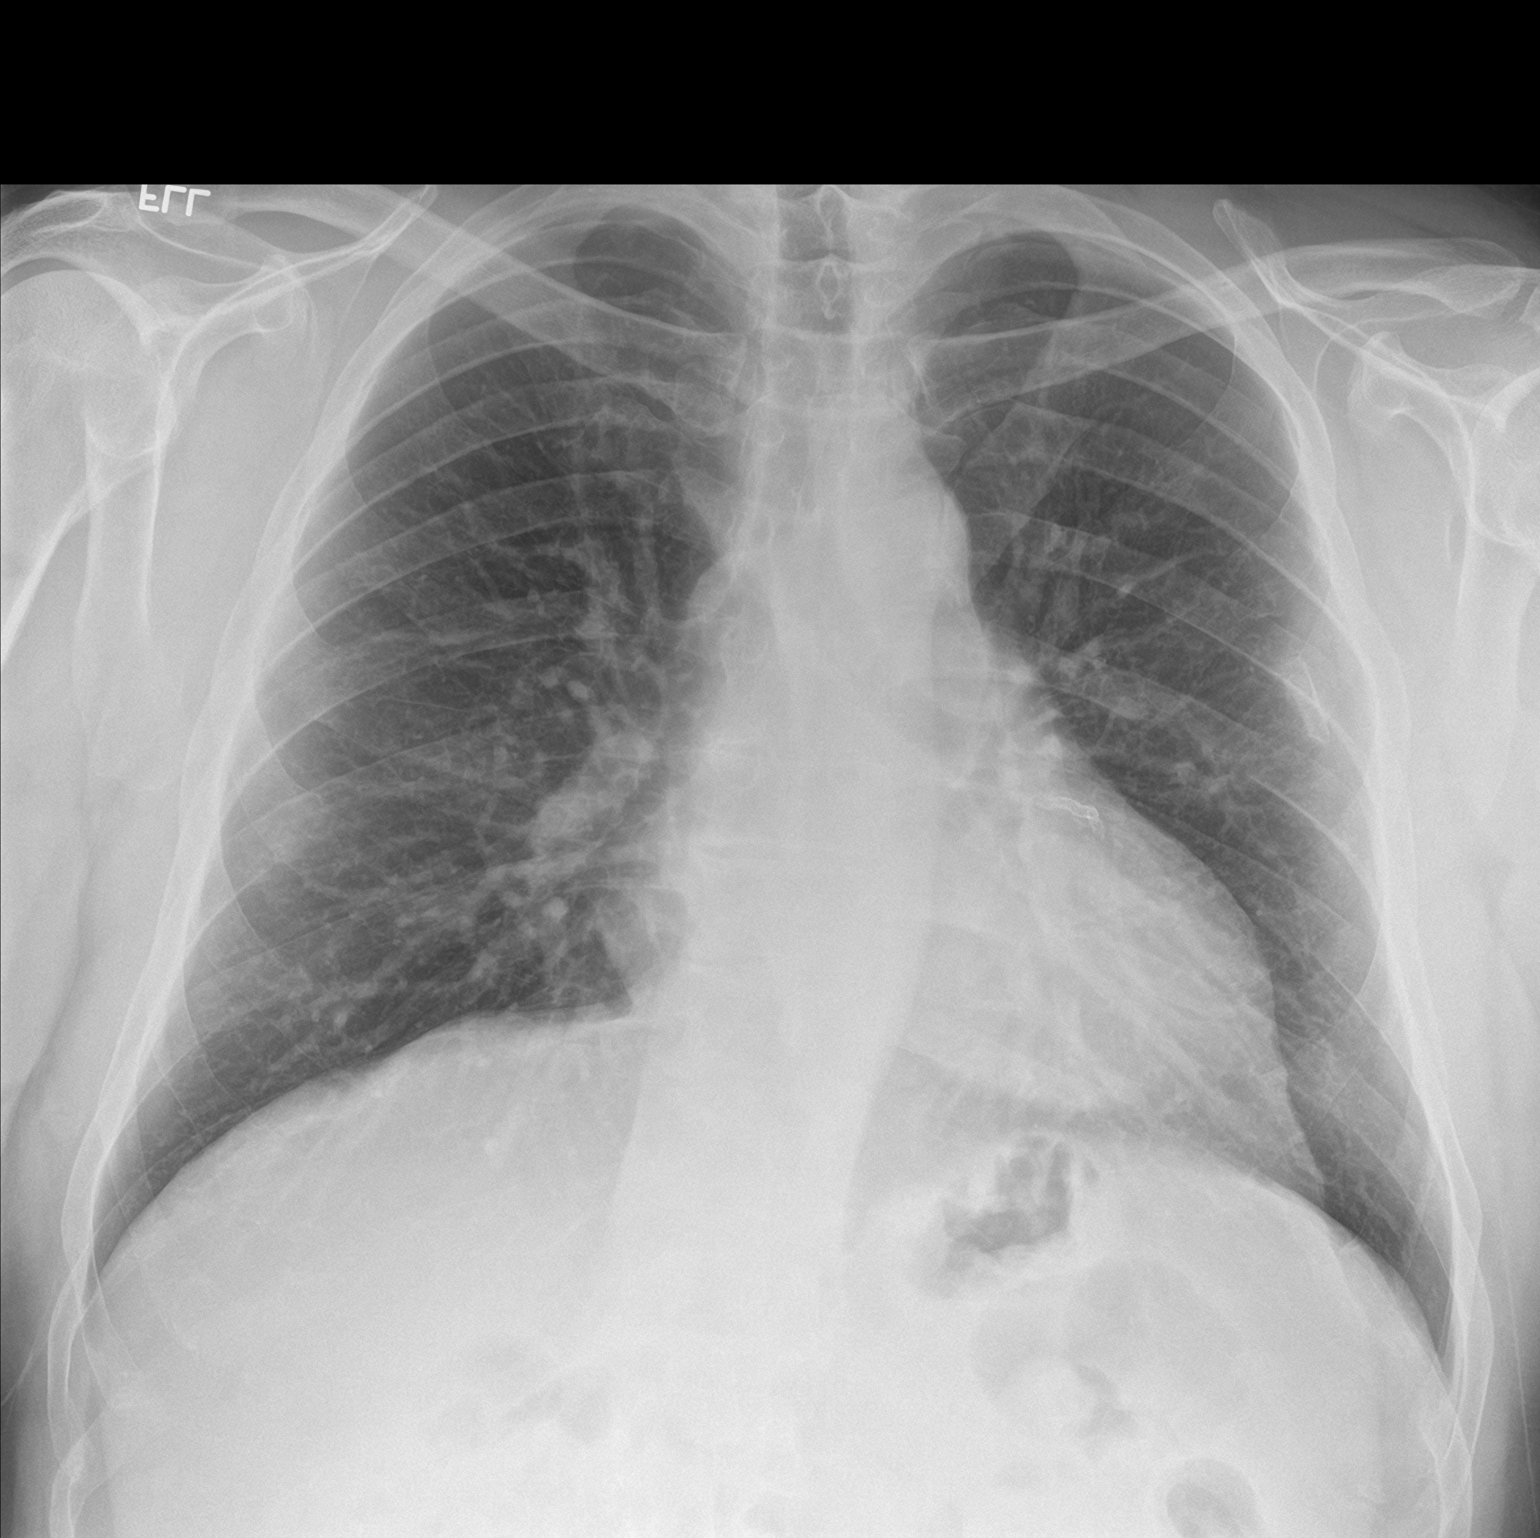

[chest lat]
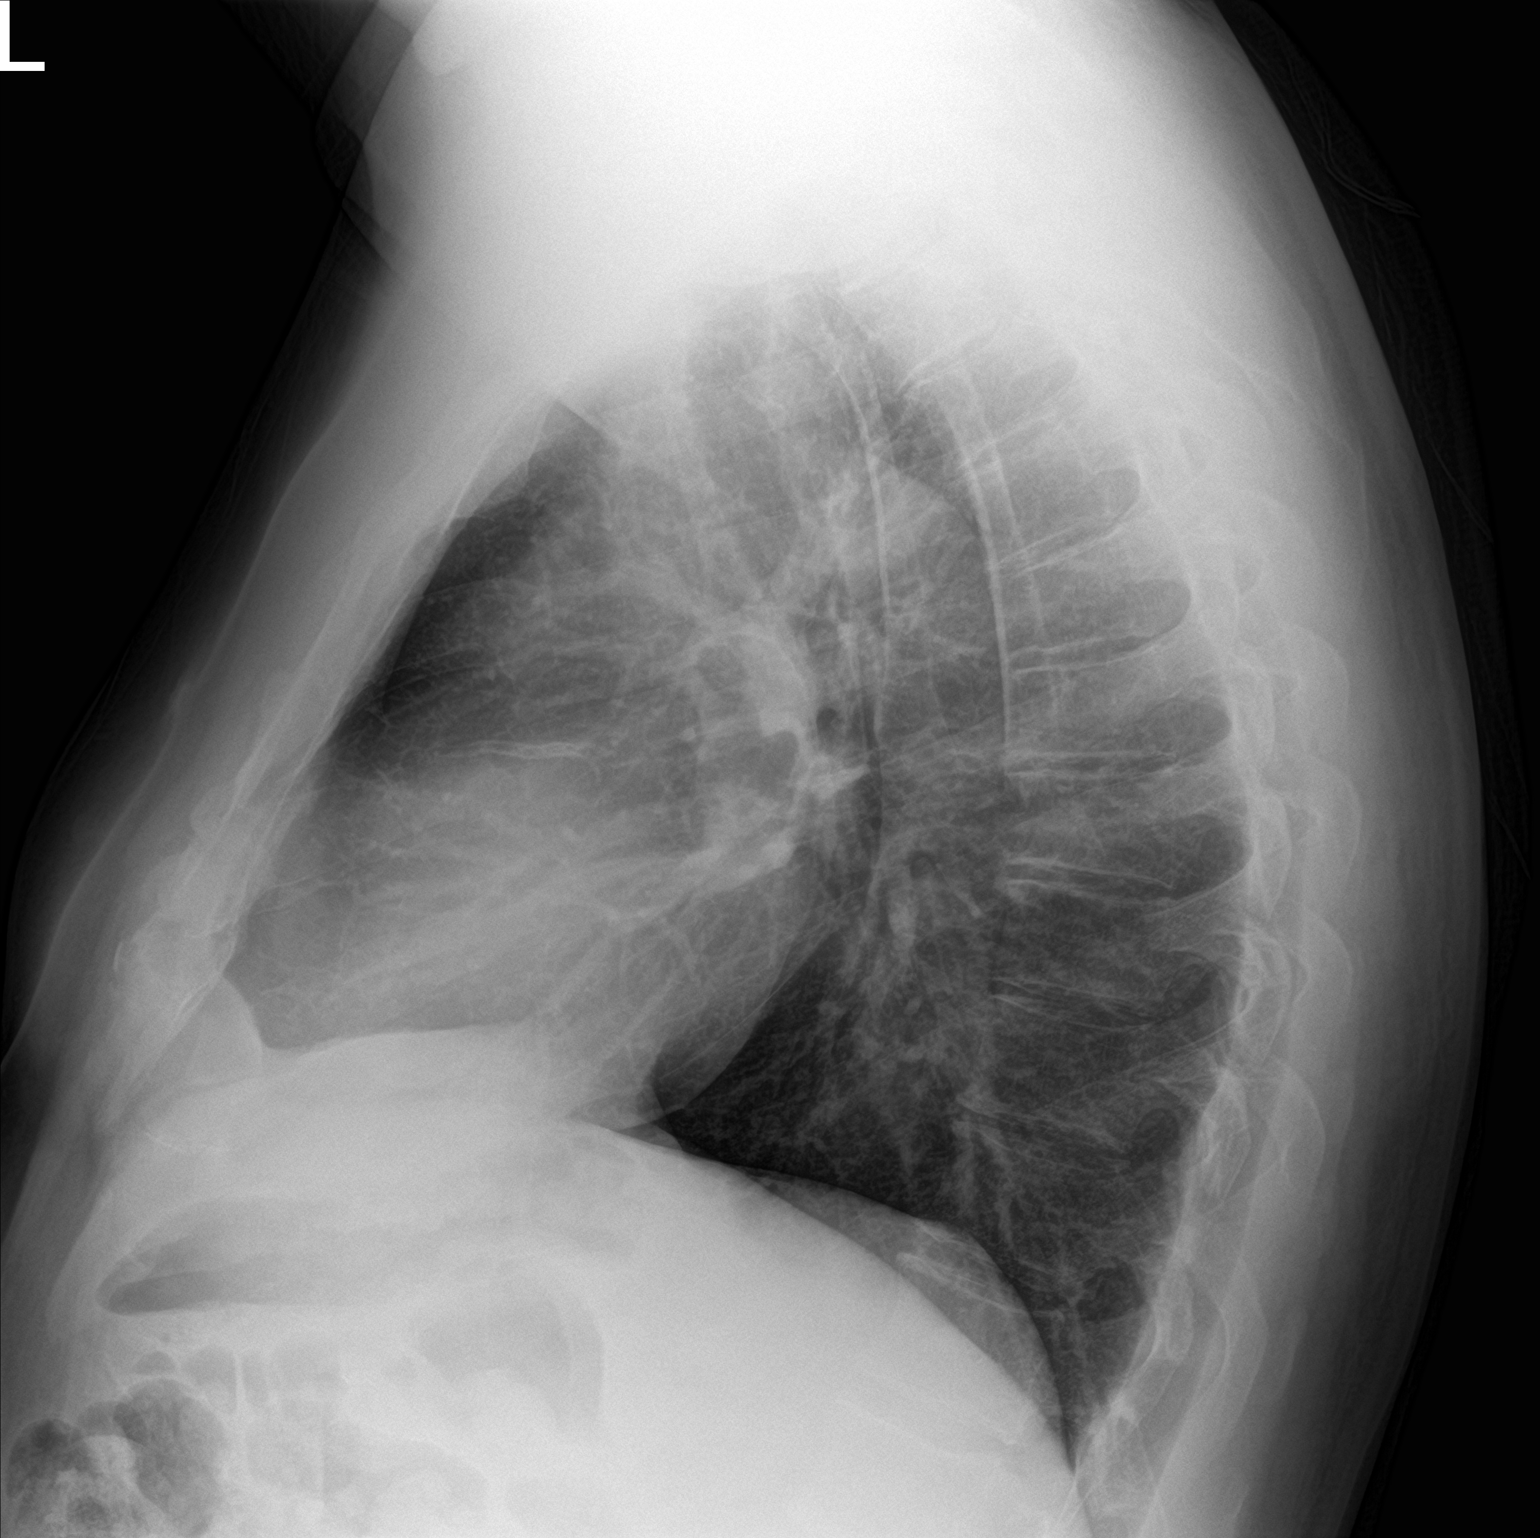

[2 of 2 positions shown; findings below may reference images not displayed]

FINDINGS: There is no edema or consolidation. Heart is upper normal in size
with pulmonary vascularity within normal limits. There is
calcification in the left anterior descending coronary artery. No
adenopathy. There is old rib trauma on the left, stable.
IMPRESSION: Coronary artery calcification noted. No edema or consolidation.
Stable cardiac silhouette.

## 2018-08-26 ENCOUNTER — Ambulatory Visit (HOSPITAL_COMMUNITY)
Admit: 2018-08-26 | Discharge: 2018-08-26 | Disposition: A | Discharge status: Home / Self Care | Payer: 59 | Source: Ambulatory Visit | Attending: Family Medicine | Admitting: Family Medicine

## 2018-08-26 ENCOUNTER — Ambulatory Visit (HOSPITAL_COMMUNITY)
Admission: RE | Admit: 2018-08-26 | Discharge: 2018-08-26 | Disposition: A | Discharge status: Home / Self Care | Payer: 59 | Source: Ambulatory Visit | Attending: Family Medicine | Admitting: Family Medicine

## 2018-08-26 ENCOUNTER — Other Ambulatory Visit (HOSPITAL_COMMUNITY): Payer: Self-pay | Admitting: Family Medicine

## 2018-08-26 DIAGNOSIS — R059 Cough, unspecified: Secondary | ICD-10-CM

## 2018-08-26 DIAGNOSIS — R05 Cough: Secondary | ICD-10-CM | POA: Insufficient documentation

## 2018-08-27 ENCOUNTER — Other Ambulatory Visit: Payer: Self-pay

## 2018-08-27 ENCOUNTER — Encounter (HOSPITAL_COMMUNITY): Payer: Self-pay | Admitting: Emergency Medicine

## 2018-08-27 DIAGNOSIS — Z7902 Long term (current) use of antithrombotics/antiplatelets: Secondary | ICD-10-CM | POA: Insufficient documentation

## 2018-08-27 DIAGNOSIS — I10 Essential (primary) hypertension: Secondary | ICD-10-CM | POA: Diagnosis not present

## 2018-08-27 DIAGNOSIS — G931 Anoxic brain damage, not elsewhere classified: Secondary | ICD-10-CM | POA: Diagnosis not present

## 2018-08-27 DIAGNOSIS — Z79899 Other long term (current) drug therapy: Secondary | ICD-10-CM | POA: Insufficient documentation

## 2018-08-27 DIAGNOSIS — L080 Pyoderma: Secondary | ICD-10-CM | POA: Diagnosis not present

## 2018-08-27 DIAGNOSIS — R509 Fever, unspecified: Secondary | ICD-10-CM | POA: Diagnosis not present

## 2018-08-27 DIAGNOSIS — Z955 Presence of coronary angioplasty implant and graft: Secondary | ICD-10-CM | POA: Diagnosis not present

## 2018-08-27 DIAGNOSIS — I248 Other forms of acute ischemic heart disease: Secondary | ICD-10-CM | POA: Diagnosis not present

## 2018-08-27 DIAGNOSIS — I2510 Atherosclerotic heart disease of native coronary artery without angina pectoris: Secondary | ICD-10-CM | POA: Insufficient documentation

## 2018-08-27 DIAGNOSIS — Z87891 Personal history of nicotine dependence: Secondary | ICD-10-CM | POA: Insufficient documentation

## 2018-08-27 DIAGNOSIS — I252 Old myocardial infarction: Secondary | ICD-10-CM | POA: Diagnosis not present

## 2018-08-27 DIAGNOSIS — R21 Rash and other nonspecific skin eruption: Secondary | ICD-10-CM | POA: Diagnosis present

## 2018-08-27 NOTE — ED Triage Notes (Signed)
Rash and hiccups for 2 days. Large erythematous raised areas to neck and back. Patient unable to stop hiccups.

## 2018-08-28 ENCOUNTER — Emergency Department (HOSPITAL_COMMUNITY)
Admission: EM | Admit: 2018-08-28 | Discharge: 2018-08-28 | Disposition: A | Discharge status: Other Acute Inpatient Hospital | Payer: 59 | Attending: Emergency Medicine | Admitting: Emergency Medicine

## 2018-08-28 DIAGNOSIS — I248 Other forms of acute ischemic heart disease: Secondary | ICD-10-CM

## 2018-08-28 DIAGNOSIS — G931 Anoxic brain damage, not elsewhere classified: Secondary | ICD-10-CM | POA: Diagnosis not present

## 2018-08-28 DIAGNOSIS — I10 Essential (primary) hypertension: Secondary | ICD-10-CM | POA: Diagnosis present

## 2018-08-28 DIAGNOSIS — L511 Stevens-Johnson syndrome: Secondary | ICD-10-CM | POA: Diagnosis present

## 2018-08-28 DIAGNOSIS — I2489 Other forms of acute ischemic heart disease: Secondary | ICD-10-CM

## 2018-08-28 DIAGNOSIS — L080 Pyoderma: Secondary | ICD-10-CM

## 2018-08-28 DIAGNOSIS — I2510 Atherosclerotic heart disease of native coronary artery without angina pectoris: Secondary | ICD-10-CM | POA: Diagnosis not present

## 2018-08-28 DIAGNOSIS — R509 Fever, unspecified: Secondary | ICD-10-CM | POA: Diagnosis not present

## 2018-08-28 DIAGNOSIS — R21 Rash and other nonspecific skin eruption: Secondary | ICD-10-CM | POA: Diagnosis present

## 2018-08-28 LAB — CBC WITH DIFFERENTIAL/PLATELET
Abs Immature Granulocytes: 0.08 10*3/uL — ABNORMAL HIGH (ref 0.00–0.07)
BASOS ABS: 0 10*3/uL (ref 0.0–0.1)
Basophils Relative: 0 %
Eosinophils Absolute: 0.1 10*3/uL (ref 0.0–0.5)
Eosinophils Relative: 1 %
HEMATOCRIT: 37.6 % — AB (ref 39.0–52.0)
HEMOGLOBIN: 12.6 g/dL — AB (ref 13.0–17.0)
Immature Granulocytes: 1 %
LYMPHS ABS: 0.8 10*3/uL (ref 0.7–4.0)
LYMPHS PCT: 7 %
MCH: 28.2 pg (ref 26.0–34.0)
MCHC: 33.5 g/dL (ref 30.0–36.0)
MCV: 84.1 fL (ref 80.0–100.0)
MONO ABS: 1.5 10*3/uL — AB (ref 0.1–1.0)
Monocytes Relative: 13 %
Neutro Abs: 8.8 10*3/uL — ABNORMAL HIGH (ref 1.7–7.7)
Neutrophils Relative %: 78 %
Platelets: 226 10*3/uL (ref 150–400)
RBC: 4.47 MIL/uL (ref 4.22–5.81)
RDW: 12.2 % (ref 11.5–15.5)
WBC: 11.2 10*3/uL — ABNORMAL HIGH (ref 4.0–10.5)
nRBC: 0 % (ref 0.0–0.2)

## 2018-08-28 LAB — COMPREHENSIVE METABOLIC PANEL
ALBUMIN: 3.6 g/dL (ref 3.5–5.0)
ALK PHOS: 121 U/L (ref 38–126)
ALT: 106 U/L — AB (ref 0–44)
AST: 161 U/L — ABNORMAL HIGH (ref 15–41)
Anion gap: 9 (ref 5–15)
BUN: 17 mg/dL (ref 6–20)
CALCIUM: 8.6 mg/dL — AB (ref 8.9–10.3)
CO2: 22 mmol/L (ref 22–32)
CREATININE: 1.09 mg/dL (ref 0.61–1.24)
Chloride: 98 mmol/L (ref 98–111)
GFR calc Af Amer: >60 mL/min (ref >60)
GFR calc non Af Amer: >60 mL/min (ref >60)
GLUCOSE: 175 mg/dL — AB (ref 70–99)
Potassium: 3.6 mmol/L (ref 3.5–5.1)
SODIUM: 129 mmol/L — AB (ref 135–145)
Total Bilirubin: 0.6 mg/dL (ref 0.3–1.2)
Total Protein: 7.3 g/dL (ref 6.5–8.1)

## 2018-08-28 LAB — URINALYSIS, ROUTINE W REFLEX MICROSCOPIC
Bacteria, UA: NONE SEEN
Bilirubin Urine: NEGATIVE
GLUCOSE, UA: NEGATIVE mg/dL
Ketones, ur: NEGATIVE mg/dL
Leukocytes,Ua: NEGATIVE
Nitrite: NEGATIVE
Protein, ur: NEGATIVE mg/dL
SPECIFIC GRAVITY, URINE: 1.005 (ref 1.005–1.030)
pH: 6 (ref 5.0–8.0)

## 2018-08-28 LAB — INFLUENZA PANEL BY PCR (TYPE A & B)
INFLBPCR: NEGATIVE
Influenza A By PCR: NEGATIVE

## 2018-08-28 LAB — TROPONIN I
Troponin I: 0.22 ng/mL (ref <0.03)
Troponin I: 0.18 ng/mL (ref <0.03)

## 2018-08-28 MED ORDER — SODIUM CHLORIDE 0.9 % IV BOLUS
1000.0000 mL | Freq: Once | INTRAVENOUS | Status: AC
  Administered 2018-08-28: 1000 mL via INTRAVENOUS

## 2018-08-28 MED ORDER — ACETAMINOPHEN 500 MG PO TABS
1000.0000 mg | ORAL_TABLET | Freq: Once | ORAL | Status: AC
  Administered 2018-08-28: 1000 mg via ORAL

## 2018-08-28 MED ORDER — ONDANSETRON HCL 4 MG/2ML IJ SOLN
4.0000 mg | Freq: Once | INTRAMUSCULAR | Status: AC
  Administered 2018-08-28: 4 mg via INTRAVENOUS
  Filled 2018-08-28: qty 2

## 2018-08-28 MED ORDER — ASPIRIN 81 MG PO CHEW: 81.0000 mg | CHEWABLE_TABLET | Freq: Every day | ORAL | Status: DC

## 2018-08-28 MED ORDER — ACETAMINOPHEN 500 MG PO TABS
ORAL_TABLET | ORAL | Status: AC
  Filled 2018-08-28: qty 2

## 2018-08-28 MED ORDER — SODIUM CHLORIDE 0.9 % IV SOLN
2.0000 g | Freq: Once | INTRAVENOUS | Status: AC
  Administered 2018-08-28: 2 g via INTRAVENOUS
  Filled 2018-08-28: qty 20

## 2018-08-28 MED ORDER — CHLORPROMAZINE HCL 25 MG/ML IJ SOLN
50.0000 mg | Freq: Once | INTRAMUSCULAR | Status: AC
  Administered 2018-08-28: 50 mg via INTRAMUSCULAR
  Filled 2018-08-28: qty 2

## 2018-08-28 MED ORDER — SODIUM CHLORIDE 0.9 % IV BOLUS
500.0000 mL | Freq: Once | INTRAVENOUS | Status: AC
  Administered 2018-08-28: 500 mL via INTRAVENOUS

## 2018-08-28 MED ORDER — VANCOMYCIN HCL IN DEXTROSE 1-5 GM/200ML-% IV SOLN
1000.0000 mg | Freq: Once | INTRAVENOUS | Status: AC
  Administered 2018-08-28: 1000 mg via INTRAVENOUS
  Filled 2018-08-28: qty 200

## 2018-08-28 MED ORDER — CLOPIDOGREL BISULFATE 75 MG PO TABS: 75.0000 mg | ORAL_TABLET | Freq: Every day | ORAL | Status: DC

## 2018-08-28 NOTE — Consult Note (Signed)
Medical Consultation   [name] Cummings  ZOX:[id number]  DOB: [birth year]  DOA: 08/28/2018  PCP: Barbie Banner, MD   Outpatient Specialists:  Cardiologist: Dr. Dina Rich Neurologist: Dr. Madelyn Flavors  Requesting physician: Dr. Devoria Albe  Reason for consultation: Fever, rash   History of Present Illness: [name] Cummings is an 58 y.o. 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  Patient was reportedly treated for a urinary tract infection last week with a course of Bactrim.  Over the past 4 days, he has had fevers and developed erythematous macules on his neck, back, chest, extending to his head and now erupting on his right knee.  He is developed vesicles/bullous lesions which have began to coalesce.  Over the past 24 hours, he is developed burning in his eyes.  He denies any painful swallowing.  He has had generalized weakness, myalgia.  He has also had vomiting in the last 24 hours.  No diarrhea.  He has had a cough.  He denies any chest pain.  He has not had any sick contacts.  He is not aware of any insect bites.  On arrival to the emergency room, he is noted to have a fever of 102 F.  Hemodynamics are otherwise stable.  Basic labs shows a mild leukocytosis of 11,000.  He has mildly elevated transaminases with AST/ALT at 161/106 respectively.  Sodium is low at 129.  He has a mild elevation of troponin with initial troponin being 0.22 and delta troponin 0 0.18.  EKG does not show any acute changes.  Chest x-ray does not have any acute findings.  Urinalysis does not show any signs of infection.  Review of Systems:  Constitutional.  Positive for fever, malaise GI: Positive for vomiting Skin: Positive for rash Respiratory: Positive for cough. All others reviewed and are negative.  Past Medical History: Past Medical History:  Diagnosis Date  . Anxiety   . Brain anoxic injury (HCC)    a. 2007 in  setting of VF arrest.  . CAD (coronary artery disease)    a. 2007 Ant MI/VF Arrest/PCI LAD w/ BMS;  b. 07/2015 Cath/PCI: LM nl, LAD 80 ISR (2.5x28 Synergy DES), LCX min irregs, OM1 99, RCA 50p.  . Depression   . Dizziness   . Dysphagia, unspecified(787.20)   . GERD (gastroesophageal reflux disease)   . History of seizure disorder   . Hyperlipidemia, mixed   . Hypertensive heart disease    unspecified  . Ischemic cardiomyopathy    a. 06/2014 Echo: EF 60-65%.  . 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  . Nephrolithiasis   . 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    Past Surgical History: Past Surgical History:  Procedure Laterality Date  . bedside tracheostomy     with #6 Shiley. 04/14/2006  . CARDIAC CATHETERIZATION  09/2005   with bare metal stent to the mid LAD (Minivision 2.5 x 18 mm) with Provisional balloon angioplasty to the second diagonal.  . CARDIAC CATHETERIZATION  03/2006   with stenting of the mid LAD with Taxus 2.5 x 20 mm and 2.5 x 8 mm stents.      Marland Kitchen CARDIAC CATHETERIZATION N/A 07/20/2015   Procedure: Left Heart Cath and Coronary Angiography;  Surgeon: Corky Crafts, MD;  Location: [facility]  INVASIVE CV LAB;  Service: Cardiovascular;  Laterality: N/A;  . CARDIAC CATHETERIZATION N/A 07/20/2015   Procedure: Coronary Stent Intervention;  Surgeon: Corky Crafts, MD;  Location: [facility] INVASIVE CV LAB;  Service: Cardiovascular;  Laterality: N/A;  . CORONARY STENT INTERVENTION N/A 05/15/2017   Procedure: CORONARY STENT INTERVENTION;  Surgeon: Tonny Bollman, MD;  Location: [facility] INVASIVE CV LAB;  Service: Cardiovascular;  Laterality: N/A;  . CORONARY STENT PLACEMENT  07/20/2015   LAD with DES  . ESOPHAGOGASTRODUODENOSCOPY    . KIDNEY STONE SURGERY    . LEFT HEART CATH AND CORONARY ANGIOGRAPHY N/A 05/15/2017   Procedure: LEFT HEART CATH AND CORONARY ANGIOGRAPHY;  Surgeon: Tonny Bollman, MD;  Location: [facility] INVASIVE CV LAB;  Service:  Cardiovascular;  Laterality: N/A;  . PEG TUBE PLACEMENT    . PEG TUBE REMOVAL    . stent thrombosis  03/2006   Complicated my sudden cardiac death  . TRACHEOSTOMY CLOSURE       Allergies:   Allergies  Allergen Reactions  . Hydromorphone Hcl Other (See Comments)    REACTION: Reaction not known  . Lorazepam Other (See Comments)    REACTION: Reaction not known  . Pseudoephedrine Other (See Comments)    Caused seizure  . Terfenadine Other (See Comments)    REACTION: Reaction not known  . Atorvastatin Other (See Comments)    Pt c/o myalgias     Social History:  reports that he has never smoked. He quit smokeless tobacco use about 12 years ago.  His smokeless tobacco use included chew. He reports that he does not drink alcohol or use drugs.   Family History: Family History  Problem Relation Age of Onset  . Heart attack Mother   . Diabetes Mother   . Hydrocephalus Mother   . Heart attack Father   . Cancer Father        unsure of type  . Diabetes Sister   . Coronary artery disease Neg Hx        Siblings     Physical Exam: Vitals:   08/28/18 0522 08/28/18 0600 08/28/18 0815 08/28/18 1000  BP:  112/76  (!) 123/94  Pulse:  73 66 78  Resp:  (!) 24 (!) 23 (!) 22  Temp: 98.8 F (37.1 C)     TempSrc: Rectal     SpO2:  91% 96% 98%  Weight:      Height:        Constitutional: Alert and awake, not in any acute distress. Eyes: PERLA, EOMI, irises appear normal, anicteric sclera, he does have some patches of erythema noted on his upper and lower lids ENMT: external ears and nose appear normal, normal hearing            Lips appears normal, oropharynx mucosa, tongue, developing patches of erythema noted in posterior pharynx Neck: neck appears normal, no masses, normal ROM, no thyromegaly, no JVD  CVS: S1-S2 clear, no murmur rubs or gallops, no LE edema, normal pedal pulses  Respiratory:  clear to auscultation bilaterally, no wheezing, rales or rhonchi. Respiratory effort  normal. No accessory muscle use.  Abdomen: soft nontender, nondistended, normal bowel sounds, no hepatosplenomegaly, no hernias  Musculoskeletal: : no cyanosis, clubbing or edema noted bilaterally Neuro: Cranial nerves II-XII intact, strength, sensation, reflexes Psych: Patient has trouble with memory and repeatedly repeats questions.  He is otherwise pleasant Skin: He has several erythematous/bullous lesions on his upper back, chest, these lesions are coalescing on the neck and on the back of  his head.  He has these lesions on his right knee.  These are tender to touch.  Data reviewed:  I have personally reviewed following labs and imaging studies Labs:  CBC: Recent Labs  Lab 08/28/18 0210  WBC 11.2*  NEUTROABS 8.8*  HGB 12.6*  HCT 37.6*  MCV 84.1  PLT 226    Basic Metabolic Panel: Recent Labs  Lab 08/28/18 0210  NA 129*  K 3.6  CL 98  CO2 22  GLUCOSE 175*  BUN 17  CREATININE 1.09  CALCIUM 8.6*   GFR Estimated Creatinine Clearance: 100.2 mL/min (by C-G formula based on SCr of 1.09 mg/dL). Liver Function Tests: Recent Labs  Lab 08/28/18 0210  AST 161*  ALT 106*  ALKPHOS 121  BILITOT 0.6  PROT 7.3  ALBUMIN 3.6   No results for input(s): LIPASE, AMYLASE in the last 168 hours. No results for input(s): AMMONIA in the last 168 hours. Coagulation profile No results for input(s): INR, PROTIME in the last 168 hours.  Cardiac Enzymes: Recent Labs  Lab 08/28/18 0210 08/28/18 0345  TROPONINI 0.22* 0.18*   BNP: Invalid input(s): POCBNP CBG: No results for input(s): GLUCAP in the last 168 hours. D-Dimer No results for input(s): DDIMER in the last 72 hours. Hgb A1c No results for input(s): HGBA1C in the last 72 hours. Lipid Profile No results for input(s): CHOL, HDL, LDLCALC, TRIG, CHOLHDL, LDLDIRECT in the last 72 hours. Thyroid function studies No results for input(s): TSH, T4TOTAL, T3FREE, THYROIDAB in the last 72 hours.  Invalid input(s): FREET3 Anemia  work up No results for input(s): VITAMINB12, FOLATE, FERRITIN, TIBC, IRON, RETICCTPCT in the last 72 hours. Urinalysis    Component Value Date/Time   COLORURINE YELLOW 08/28/2018 0210   APPEARANCEUR CLEAR 08/28/2018 0210   LABSPEC 1.005 08/28/2018 0210   PHURINE 6.0 08/28/2018 0210   GLUCOSEU NEGATIVE 08/28/2018 0210   HGBUR MODERATE (A) 08/28/2018 0210   BILIRUBINUR NEGATIVE 08/28/2018 0210   KETONESUR NEGATIVE 08/28/2018 0210   PROTEINUR NEGATIVE 08/28/2018 0210   NITRITE NEGATIVE 08/28/2018 0210   LEUKOCYTESUR NEGATIVE 08/28/2018 0210     Microbiology Recent Results (from the past 240 hour(s))  Culture, blood (routine x 2)     Status: None (Preliminary result)   Collection Time: 08/28/18  3:39 AM  Result Value Ref Range Status   Specimen Description BLOOD LEFT HAND  Final   Special Requests   Final    BOTTLES DRAWN AEROBIC AND ANAEROBIC Blood Culture adequate volume   Culture   Final    NO GROWTH < 12 HOURS Performed at [facility], [address] [place], [place] [address]    Report Status PENDING  Incomplete  Culture, blood (routine x 2)     Status: None (Preliminary result)   Collection Time: 08/28/18  3:44 AM  Result Value Ref Range Status   Specimen Description BLOOD RIGHT HAND  Final   Special Requests   Final    BOTTLES DRAWN AEROBIC ONLY Blood Culture adequate volume   Culture   Final    NO GROWTH < 12 HOURS Performed at [facility], [address] [place], [place] [address]    Report Status PENDING  Incomplete       Inpatient Medications:   Scheduled Meds: . aspirin  81 mg Oral Daily  . clopidogrel  75 mg Oral Daily   Continuous Infusions:   Radiological Exams on Admission: Dg Chest 2 View  Result Date: 08/27/2018 CLINICAL DATA:  Cough, fever. EXAM: CHEST -  2 VIEW COMPARISON:  Radiographs of May 11, 2017. FINDINGS: The heart size and mediastinal contours are within normal limits. Both lungs are clear. The visualized skeletal structures are  unremarkable. IMPRESSION: No active cardiopulmonary disease. Electronically Signed   By: Lupita Raider, M.D.   On: 08/27/2018 11:43    Impression/Recommendations Active Problems:   Essential hypertension   CAD, NATIVE VESSEL   Brain anoxic injury (HCC)   Rash   Stevens-Johnson syndrome (HCC)   Demand ischemia (HCC)  1. Rash.  With history of recent exposure to Bactrim, development of fever, myalgias, bullous rash and progressive mucosal involvement, there is concern for development of Stevens-Johnson syndrome.  This patient will likely need a multidisciplinary approach to his care.  May need dermatology/ophthalmology involvement.  Unfortunately, the services are not available at [facility].  I have discussed the case with Dr. Renaldo Reel at [facility] who is agreed to accept the patient in transfer.  Continue supportive management for now. 2. Demand ischemia.  Mild elevation of troponin which has since trended down.  No chest pain or acute EKG changes. 3. Coronary artery disease.  Continue on aspirin and Plavix.  Plan is to follow-up with cardiology for outpatient stress testing. 4. Hypertension.  Blood pressures currently stable.  He is on carvedilol.  Will hold for now in the setting of acute illness.  Resume if pressure trends up. 5. History of anoxic brain injury status post ventricular fibrillation arrest in 2007.  Mental status appears to be at baseline.  Time Spent:  Erick Blinks M.D. [facility] 08/28/2018, 10:26 AM

## 2018-08-28 NOTE — ED Notes (Signed)
Report given to [facility].  

## 2018-08-28 NOTE — ED Notes (Signed)
Date and time results received: 08/28/18 0255 (use smartphrase ".now" to insert current time)  Test: troponin Critical Value: 0.22  Name of Provider Notified: Dr Lynelle Doctor  Orders Received? Or Actions Taken?: Actions Taken: no orders received.

## 2018-08-28 NOTE — ED Notes (Signed)
Patient given breakfast tray.

## 2018-08-28 NOTE — ED Provider Notes (Signed)
[facility]ERGENCY DEPARTMENT Provider Note   CSN: [id number] Arrival date & time: 08/27/18  1826  Time seen 1:40 AM  History   Chief Complaint Chief Complaint  Patient presents with  . Rash    HPI [name] Cummings is a 58 y.o. male.   Level 5 caveat due to prior head injury with memory loss  HPI wife states patient has had a head injury and he has no memory.  She states he had a rash he woke up on the 26th and he was seen by his doctor that day.  It was on his back and it look like small little red raised bumps.  She states they were not pruritic.  At that point he was thought to be allergic to an antibiotic and he had been on Bactrim for 9 days for UTI.  Prior to that he had been on doxycycline since January 26 for possible pneumonia.  His doctor started him on Levaquin on the 26th for "pneumonia".  However they were later called and told that his x-ray did not show pneumonia and that he had a "bacterial infection".  Wife states he has had fevers of 102 and 104 up to yesterday.  She states he had nausea and vomiting twice the first day, possibly 10 times yesterday but none today.  He had diarrhea once this morning.  He denies any itching.  He did have a flu test done on the 26th that was negative.  He has not had a flu shot this season.  Wife states he complains of a lot of body aches and weakness.  He has had a cough and he feels short of breath.  They deny wheezing or chest pain.  He denies abdominal pain although he states sometimes it hurts in his left lower quadrant.  He also has been having lots hiccuping.  Wife states the rash is worse today and now is also on his chest and his right knee.  PCP Barbie BannerWilson, Fred H, MD   Past Medical History:  Diagnosis Date  . Anxiety   . 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  . CAD (coronary artery disease)    a. 2007 Ant MI/VF Arrest/PCI LAD w/ BMS;  b. 07/2015 Cath/PCI: LM nl, LAD 80 ISR (2.5x28 Synergy DES), LCX min  irregs, OM1 99, RCA 50p.  . Depression   . Dizziness   . Dysphagia, unspecified(787.20)   . GERD (gastroesophageal reflux disease)   . History of seizure disorder   . Hyperlipidemia, mixed   . Hypertensive heart disease    unspecified  . Ischemic cardiomyopathy    a. 06/2014 Echo: EF 60-65%.  . 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  . Nephrolithiasis   . 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    Patient Active Problem List   Diagnosis Date Noted  . Dizziness 01/25/2018  . Excessive sleepiness 01/25/2018  . Abnormal nuclear cardiac imaging test 05/15/2017  . Numbness and tingling of left side of face 08/16/2015  . Anxiety 07/21/2015  . Unstable angina (HCC) 07/21/2015  . Hypertensive heart disease   . Hyperlipidemia, mixed   . CAD (coronary artery disease)   . Brain anoxic injury (HCC)   . Angina pectoris (HCC)   . 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 CAD, NATIVE VESSEL 07/16/2009  . 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 Hyperlipidemia LDL goal <70  11/10/2008  . Anxiety state 11/10/2008  . Depression 11/10/2008  . Essential hypertension 11/10/2008  . CARDIOMYOPATHY, ISCHEMIC 11/10/2008  . VENTRICULAR FIBRILLATION 11/10/2008  . DYSPHAGIA UNSPECIFIED 11/10/2008  . SEIZURE DISORDER, HX OF 11/10/2008    Past Surgical History:  Procedure Laterality Date  . bedside tracheostomy     with #6 Shiley. 04/14/2006  . CARDIAC CATHETERIZATION  09/2005   with bare metal stent to the mid LAD (Minivision 2.5 x 18 mm) with Provisional balloon angioplasty to the second diagonal.  . CARDIAC CATHETERIZATION  03/2006   with stenting of the mid LAD with Taxus 2.5 x 20 mm and 2.5 x 8 mm stents.      Marland Kitchen CARDIAC CATHETERIZATION N/A 07/20/2015   Procedure: Left Heart Cath and Coronary Angiography;  Surgeon: Corky Crafts, MD;  Location: [facility] INVASIVE CV LAB;  Service: Cardiovascular;  Laterality: N/A;  . CARDIAC CATHETERIZATION N/A 07/20/2015     Procedure: Coronary Stent Intervention;  Surgeon: Corky Crafts, MD;  Location: [facility] INVASIVE CV LAB;  Service: Cardiovascular;  Laterality: N/A;  . CORONARY STENT INTERVENTION N/A 05/15/2017   Procedure: CORONARY STENT INTERVENTION;  Surgeon: Tonny Bollman, MD;  Location: [facility] INVASIVE CV LAB;  Service: Cardiovascular;  Laterality: N/A;  . CORONARY STENT PLACEMENT  07/20/2015   LAD with DES  . ESOPHAGOGASTRODUODENOSCOPY    . KIDNEY STONE SURGERY    . LEFT HEART CATH AND CORONARY ANGIOGRAPHY N/A 05/15/2017   Procedure: LEFT HEART CATH AND CORONARY ANGIOGRAPHY;  Surgeon: Tonny Bollman, MD;  Location: [facility] INVASIVE CV LAB;  Service: Cardiovascular;  Laterality: N/A;  . PEG TUBE PLACEMENT    . PEG TUBE REMOVAL    . stent thrombosis  03/2006   Complicated my sudden cardiac death  . TRACHEOSTOMY CLOSURE          Home Medications    Prior to Admission medications   Medication Sig Start Date End Date Taking? Authorizing Provider  aspirin EC 81 MG tablet Take 81 mg by mouth daily.    [provider]  carvedilol (COREG) 25 MG tablet Take 1 tablet (25 mg total) 2 (two) times daily with a meal by mouth. 05/15/17   Bhagat, Bhavinkumar, PA  clopidogrel (PLAVIX) 75 MG tablet Take 1 tablet (75 mg total) daily by mouth. Same day PCI 05/15/17   Manson Passey, PA  doxycycline (VIBRA-TABS) 100 MG tablet Take 100 mg by mouth 2 (two) times daily. 07/25/18   [provider]  FLUoxetine (PROZAC) 20 MG capsule Take 20 mg by mouth at bedtime.  04/20/14   [provider]  fluticasone (FLONASE) 50 MCG/ACT nasal spray Place 1 spray into both nostrils daily as needed for allergies.  04/20/14   [provider]  furosemide (LASIX) 20 MG tablet Take 1 tablet (20 mg total) daily as needed by mouth for fluid. 05/15/17   Bhagat, Sharrell Ku, PA  HYDROcodone-acetaminophen (NORCO/VICODIN) 5-325 MG tablet TAKE 1 TABLET BY MOUTH 4 TIMES DAILY AS NEEDED 07/13/18   [provider]  loratadine (CLARITIN) 10 MG tablet Take 10 mg daily as needed by mouth for allergies.     [provider]  nitroGLYCERIN (NITROSTAT) 0.4 MG SL tablet Place 1 tablet (0.4 mg total) every 5 (five) minutes as needed under the tongue for chest pain. 05/15/17   Bhagat, Sharrell Ku, PA  pantoprazole (PROTONIX) 40 MG tablet Take 1 tablet (40 mg total) daily by mouth. 05/15/17   Bhagat, Bhavinkumar, PA  pravastatin (PRAVACHOL) 20 MG tablet Take 1 tablet (  20 mg total) every evening by mouth. 05/15/17   Bhagat, Bhavinkumar, PA  predniSONE (DELTASONE) 20 MG tablet Take 20 mg by mouth 2 (two) times daily. 07/25/18   [provider]  VENTOLIN HFA 108 (90 Base) MCG/ACT inhaler INHALE 1 TO 2 PUFFS BY MOUTH 4 TIMES DAILY AS NEEDED 07/25/18   [provider]  VIRTUSSIN A/C 100-10 MG/5ML syrup TAKE 10 ML BY MOUTH THREE TIMES DAILY AS NEEDED 07/25/18   [provider]    Family History Family History  Problem Relation Age of Onset  . Heart attack Mother   . Diabetes Mother   . Hydrocephalus Mother   . Heart attack Father   . Cancer Father        unsure of type  . Diabetes Sister   . Coronary artery disease Neg Hx        Siblings    Social History Social History   Tobacco Use  . Smoking status: Never Smoker  . Smokeless tobacco: Former Neurosurgeon    Types: Chew  Substance Use Topics  . Alcohol use: No    Alcohol/week: 0.0 standard drinks  . Drug use: No  lives at home Lives with spouse   Allergies   Hydromorphone hcl; Lorazepam; Pseudoephedrine; Terfenadine; and Atorvastatin   Review of Systems Review of Systems  All other systems reviewed and are negative.    Physical Exam Updated Vital Signs BP 112/76   Pulse 73   Temp (!) 102.1 F (38.9 C) (Rectal)   Resp 16   Ht  (1.88 m)   Wt 113.4 kg   SpO2 96%   BMI 32.10 kg/m   Physical Exam Vitals signs and nursing note reviewed.  Constitutional:      Appearance: Normal appearance.       Comments: Patient keeps repeating he has had a MI twice  HENT:     Head: Normocephalic and atraumatic.     Right Ear: External ear normal.     Left Ear: External ear normal.     Nose: Nose normal.     Mouth/Throat:     Mouth: Mucous membranes are dry.  Eyes:     Extraocular Movements: Extraocular movements intact.     Conjunctiva/sclera: Conjunctivae normal.     Pupils: Pupils are equal, round, and reactive to light.  Neck:     Musculoskeletal: Normal range of motion and neck supple.  Cardiovascular:     Rate and Rhythm: Normal rate and regular rhythm.  Pulmonary:     Effort: Pulmonary effort is normal. No respiratory distress.     Breath sounds: Normal breath sounds.  Musculoskeletal: Normal range of motion.        General: No deformity.  Skin:    General: Skin is warm and dry.     Findings: Rash present.     Comments: Patient is noted to have scattered large once to 1-1/2 cm sized red-based pustular type lesions on his upper back, upper chest, and around his right neck.  He has an area of redness on his right anterior knee that is not pustular.  Neurological:     General: No focal deficit present.     Mental Status: He is alert.     Cranial Nerves: No cranial nerve deficit.  Psychiatric:        Mood and Affect: Mood normal.        Behavior: Behavior normal.        Thought Content: Thought content normal.  ED Treatments / Results  Labs (all labs ordered are listed, but only abnormal results are displayed) Results for orders placed or performed during the hospital encounter of 08/28/18  Culture, blood (routine x 2)  Result Value Ref Range   Specimen Description BLOOD LEFT HAND    Special Requests      BOTTLES DRAWN AEROBIC AND ANAEROBIC Blood Culture adequate volume Performed at [facility], [address], [place], [place] [address]    Culture PENDING    Report Status PENDING   Culture, blood (routine x 2)  Result Value Ref Range    Specimen Description BLOOD RIGHT HAND    Special Requests      BOTTLES DRAWN AEROBIC ONLY Blood Culture adequate volume Performed at [facility], [address] [place], [place] [address]    Culture PENDING    Report Status PENDING   Urinalysis, Routine w reflex microscopic  Result Value Ref Range   Color, Urine YELLOW YELLOW   APPearance CLEAR CLEAR   Specific Gravity, Urine 1.005 1.005 - 1.030   pH 6.0 5.0 - 8.0   Glucose, UA NEGATIVE NEGATIVE mg/dL   Hgb urine dipstick MODERATE (A) NEGATIVE   Bilirubin Urine NEGATIVE NEGATIVE   Ketones, ur NEGATIVE NEGATIVE mg/dL   Protein, ur NEGATIVE NEGATIVE mg/dL   Nitrite NEGATIVE NEGATIVE   Leukocytes,Ua NEGATIVE NEGATIVE   RBC / HPF 0-5 0 - 5 RBC/hpf   WBC, UA 0-5 0 - 5 WBC/hpf   Bacteria, UA NONE SEEN NONE SEEN  Comprehensive metabolic panel  Result Value Ref Range   Sodium 129 (L) 135 - 145 mmol/L   Potassium 3.6 3.5 - 5.1 mmol/L   Chloride 98 98 - 111 mmol/L   CO2 22 22 - 32 mmol/L   Glucose, Bld 175 (H) 70 - 99 mg/dL   BUN 17 6 - 20 mg/dL   Creatinine, Ser 0.98 0.61 - 1.24 mg/dL   Calcium 8.6 (L) 8.9 - 10.3 mg/dL   Total Protein 7.3 6.5 - 8.1 g/dL   Albumin 3.6 3.5 - 5.0 g/dL   AST 119 (H) 15 - 41 U/L   ALT 106 (H) 0 - 44 U/L   Alkaline Phosphatase 121 38 - 126 U/L   Total Bilirubin 0.6 0.3 - 1.2 mg/dL   GFR calc non Af Amer >60 >60 mL/min   GFR calc Af Amer >60 >60 mL/min   Anion gap 9 5 - 15  Troponin I - Once  Result Value Ref Range   Troponin I 0.22 (HH) <0.03 ng/mL  CBC with Differential  Result Value Ref Range   WBC 11.2 (H) 4.0 - 10.5 K/uL   RBC 4.47 4.22 - 5.81 MIL/uL   Hemoglobin 12.6 (L) 13.0 - 17.0 g/dL   HCT 14.7 (L) 82.9 - 56.2 %   MCV 84.1 80.0 - 100.0 fL   MCH 28.2 26.0 - 34.0 pg   MCHC 33.5 30.0 - 36.0 g/dL   RDW 13.0 86.5 - 78.4 %   Platelets 226 150 - 400 K/uL   nRBC 0.0 0.0 - 0.2 %   Neutrophils Relative % 78 %   Neutro Abs 8.8 (H) 1.7 - 7.7 K/uL   Lymphocytes Relative 7 %   Lymphs Abs 0.8  0.7 - 4.0 K/uL   Monocytes Relative 13 %   Monocytes Absolute 1.5 (H) 0.1 - 1.0 K/uL   Eosinophils Relative 1 %   Eosinophils Absolute 0.1 0.0 - 0.5 K/uL   Basophils Relative 0 %   Basophils  Absolute 0.0 0.0 - 0.1 K/uL   Immature Granulocytes 1 %   Abs Immature Granulocytes 0.08 (H) 0.00 - 0.07 K/uL  Influenza panel by PCR (type A & B)  Result Value Ref Range   Influenza A By PCR NEGATIVE NEGATIVE   Influenza B By PCR NEGATIVE NEGATIVE  Troponin I - ONCE - STAT  Result Value Ref Range   Troponin I 0.18 (HH) <0.03 ng/mL   Laboratory interpretation all normal except hyponatremia, hyperglycemia, elevation of LFTs, positive troponin, leukocytosis    EKG EKG Interpretation  Date/Time:  Saturday August 28 2018 03:27:02 EST Ventricular Rate:  86 PR Interval:    QRS Duration: 87 QT Interval:  407 QTC Calculation: 487 R Axis:   -12 Text Interpretation:  Sinus rhythm Atrial premature complexes Consider anterior infarct Baseline wander in lead(s) V3 Since last tracing rate faster 15 May 2017 Confirmed by Devoria Albe ([id number]) on 08/28/2018 3:33:10 AM   Radiology Dg Chest 2 View  Result Date: 08/27/2018 CLINICAL DATA:  Cough, fever. EXAM: CHEST - 2 VIEW COMPARISON:  Radiographs of May 11, 2017. FINDINGS: The heart size and mediastinal contours are within normal limits. Both lungs are clear. The visualized skeletal structures are unremarkable. IMPRESSION: No active cardiopulmonary disease. Electronically Signed   By: Lupita Raider, M.D.   On: 08/27/2018 11:43    Procedures .Critical Care Performed by: Devoria Albe, MD Authorized by: Devoria Albe, MD   Critical care provider statement:    Critical care time (minutes):  32   Critical care was necessary to treat or prevent imminent or life-threatening deterioration of the following conditions:  Sepsis   Critical care was time spent personally by me on the following activities:  Discussions with consultants, obtaining history from  patient or surrogate, ordering and review of laboratory studies, ordering and review of radiographic studies, pulse oximetry and re-evaluation of patient's condition   (including critical care time)  Medications Ordered in ED Medications  vancomycin (VANCOCIN) IVPB 1000 mg/200 mL premix (1,000 mg Intravenous New Bag/Given 08/28/18 0512)  cefTRIAXone (ROCEPHIN) 2 g in sodium chloride 0.9 % 100 mL IVPB (has no administration in time range)  sodium chloride 0.9 % bolus 1,000 mL (0 mLs Intravenous Stopped 08/28/18 0328)  sodium chloride 0.9 % bolus 500 mL (0 mLs Intravenous Stopped 08/28/18 0329)  ondansetron (ZOFRAN) injection 4 mg (4 mg Intravenous Given 08/28/18 0222)  chlorproMAZINE (THORAZINE) injection 50 mg (50 mg Intramuscular Given 08/28/18 0223)  acetaminophen (TYLENOL) tablet 1,000 mg (1,000 mg Oral Given 08/28/18 0233)     Initial Impression / Assessment and Plan / ED Course  I have reviewed the triage vital signs and the nursing notes.  Pertinent labs & imaging results that were available during my care of the patient were reviewed by me and considered in my medical decision making (see chart for details).       Patient was given IV fluid bolus, he was given IV Thorazine for his hiccups.  I had the nurses do a rectal temperature on him.  Nurses report patient's rectal temp was 102.  He was given acetaminophen.  Blood cultures were done.  Patient's troponin was positive, EKG was done although he did not complain of chest pain.  Patient's delta troponin is mildly lower than the initial.  Concern is that he may have septicemia from these furuncles that he is got on his skin.  He was started on vancomycin.  I am going to talk to the hospitalist about admission.  I have talked to the patient's wife about his test results, patient is sleeping and the hiccups have been controlled by the Thorazine.  5:20 AM Dr. Onalee Hua, hospitalist will admit.  She wants to also start him on Rocephin 2 g  IV.  Final Clinical Impressions(s) / ED Diagnoses   Final diagnoses:  Fever, unspecified fever cause  Pustular rash    Plan admission  Devoria Albe, MD, Concha Pyo, MD 08/28/18 [phone]

## 2018-08-29 LAB — URINE CULTURE
CULTURE: NO GROWTH
Special Requests: NORMAL

## 2018-08-30 LAB — AEROBIC CULTURE  (SUPERFICIAL SPECIMEN)

## 2018-08-30 LAB — AEROBIC CULTURE W GRAM STAIN (SUPERFICIAL SPECIMEN): Culture: NO GROWTH

## 2018-08-31 ENCOUNTER — Ambulatory Visit (HOSPITAL_COMMUNITY): Payer: 59

## 2018-08-31 ENCOUNTER — Encounter (HOSPITAL_COMMUNITY): Payer: 59

## 2018-08-31 DIAGNOSIS — L982 Febrile neutrophilic dermatosis [Sweet]: Secondary | ICD-10-CM | POA: Insufficient documentation

## 2018-09-02 LAB — CULTURE, BLOOD (ROUTINE X 2)
CULTURE: NO GROWTH
CULTURE: NO GROWTH
SPECIAL REQUESTS: ADEQUATE
SPECIAL REQUESTS: ADEQUATE

## 2018-09-11 IMAGING — DX DG SHOULDER 2+V*L*
4 series · 4 of 4 positions shown · non-contrast
Comparison: None.

CLINICAL DATA: Pain.

EXAM:
LEFT SHOULDER - 2+ VIEW

[shoulder grashey]
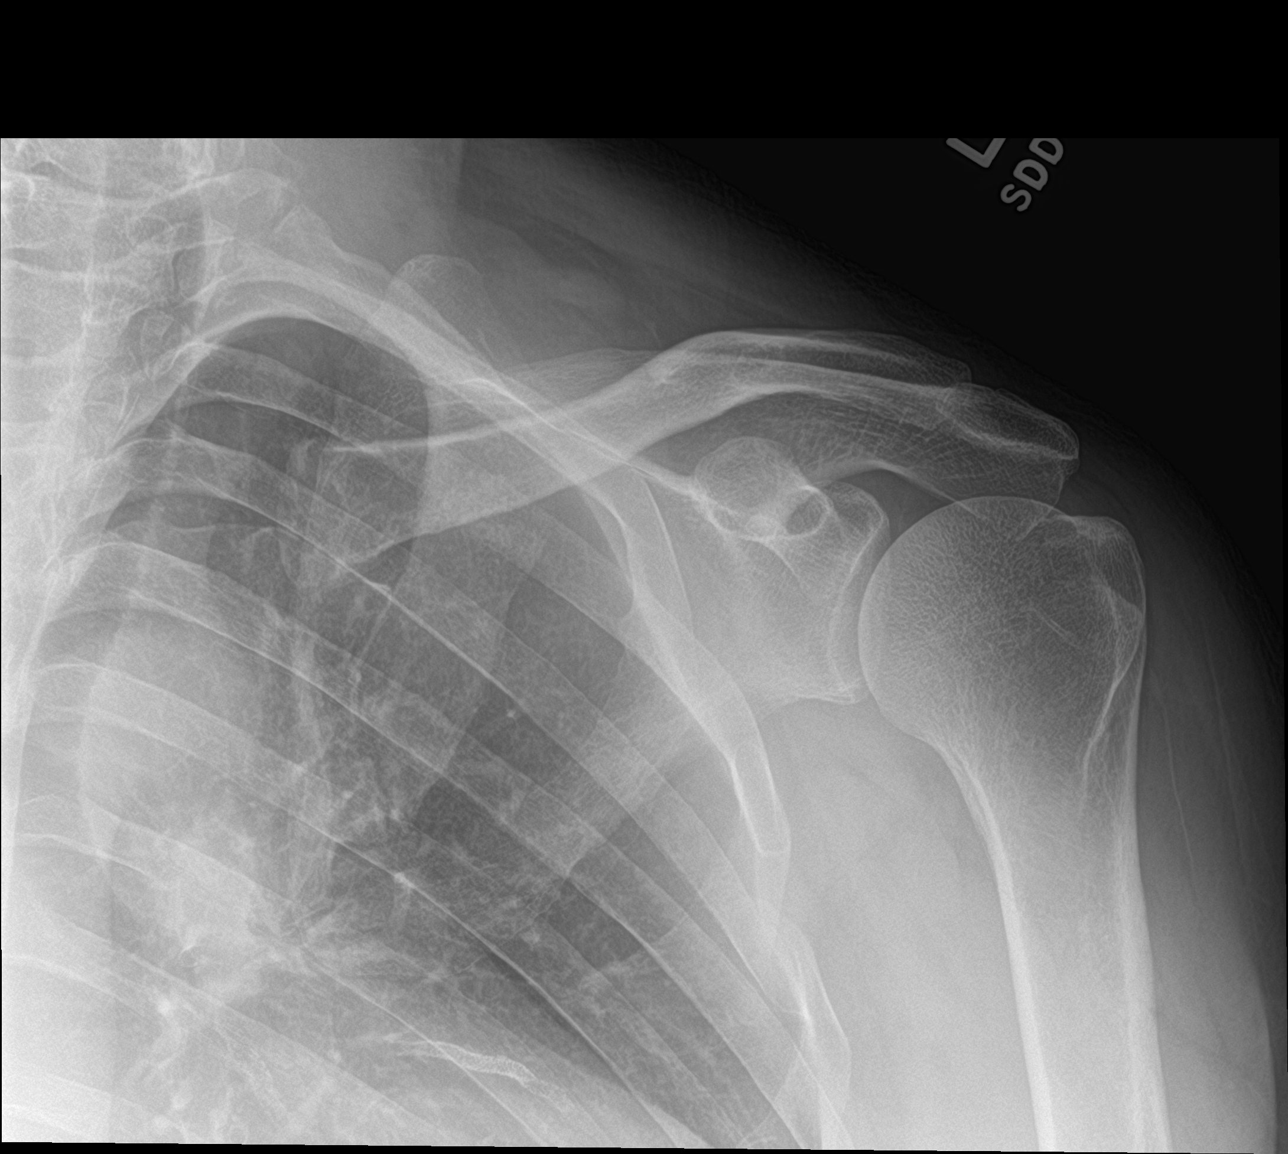

[shoulder y view (1 of 2)]
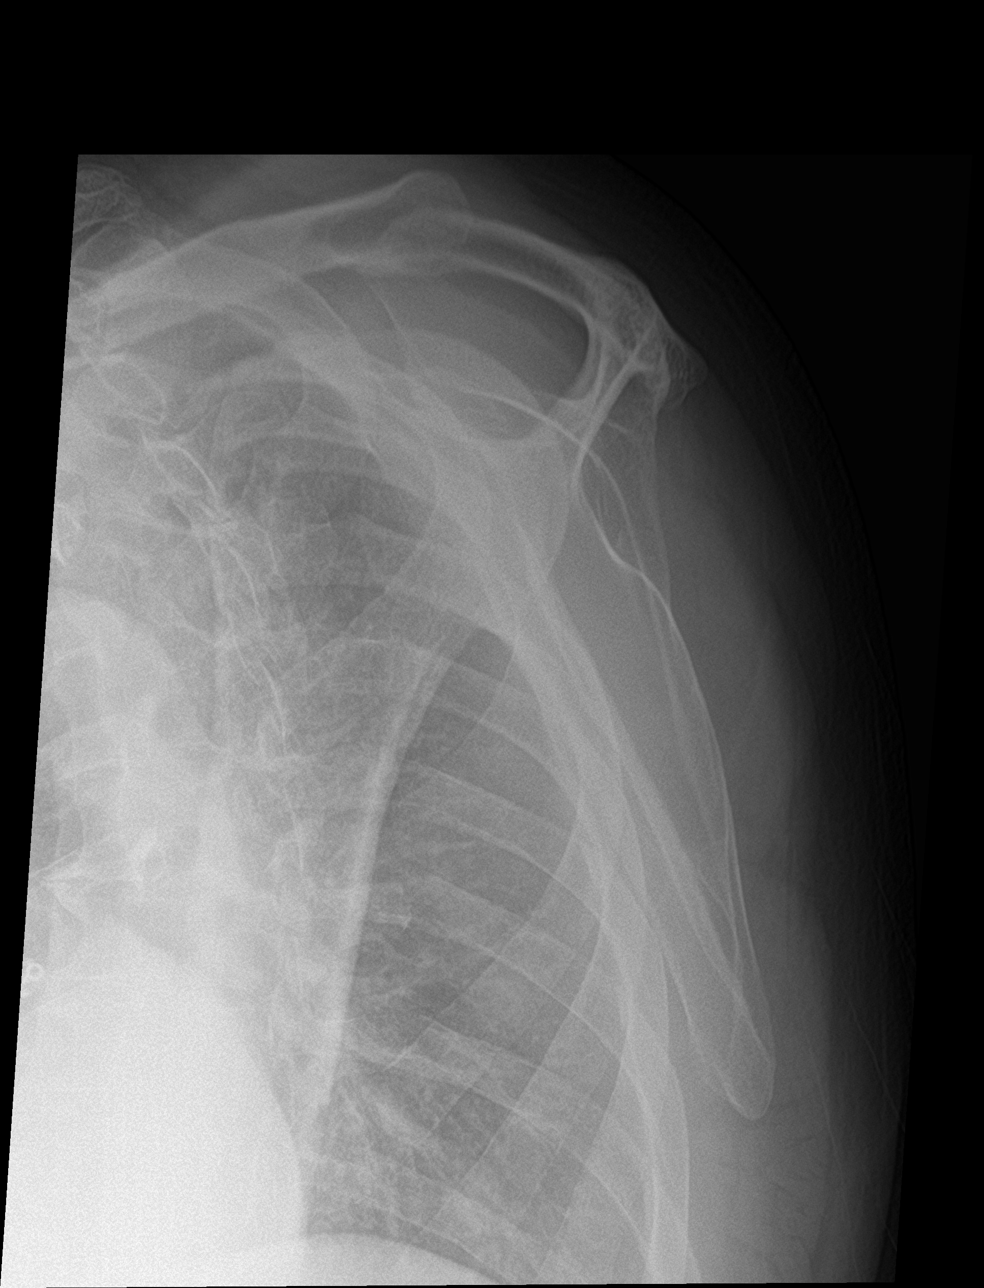

[shoulder axillary]
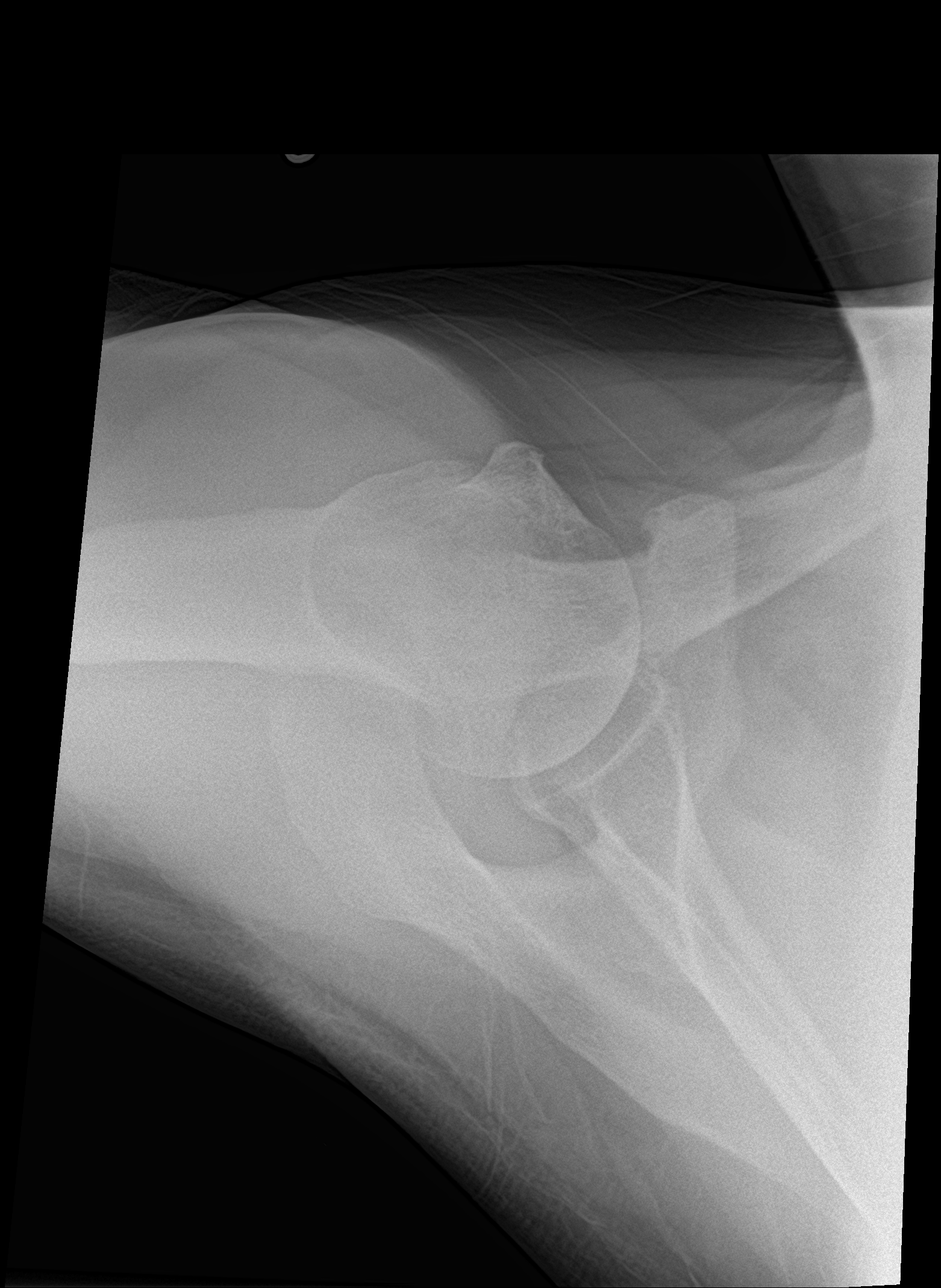

[shoulder y view (2 of 2)]
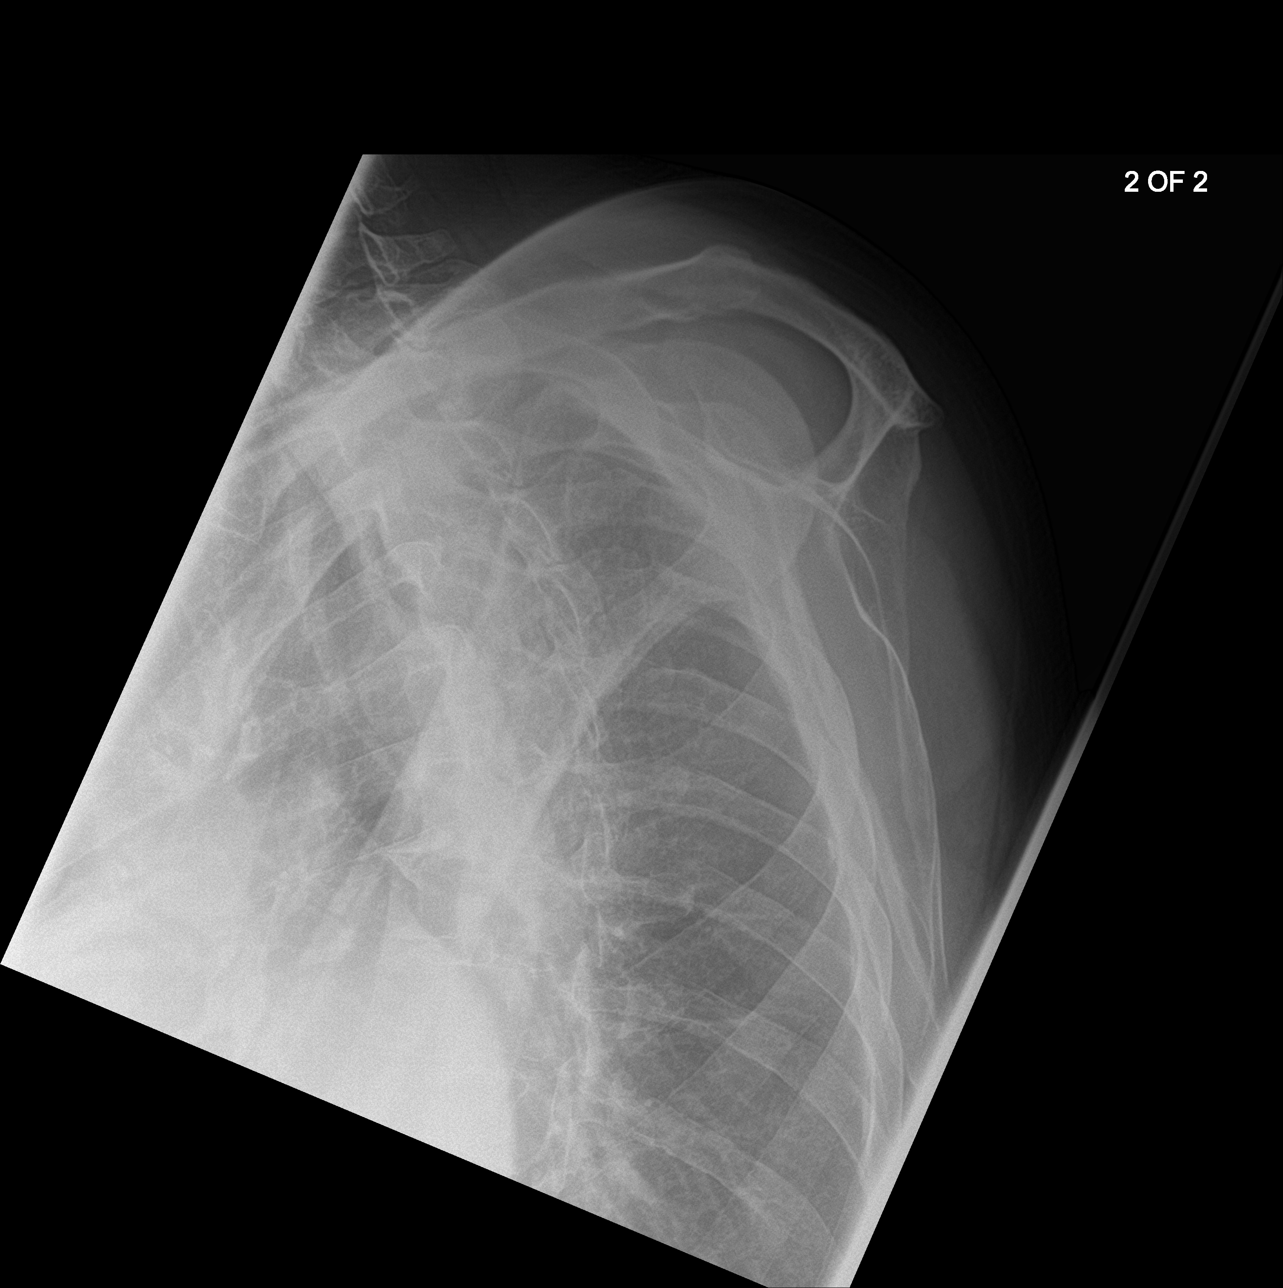

[4 of 4 positions shown; findings below may reference images not displayed]

FINDINGS: There is no evidence of fracture or dislocation. There is no
evidence of arthropathy or other focal bone abnormality. Soft
tissues are unremarkable.
IMPRESSION: Negative.

## 2018-11-05 ENCOUNTER — Telehealth: Payer: Self-pay

## 2018-11-05 NOTE — Telephone Encounter (Signed)
Called pt to change office visit to [facility], No answer, left message for pt to return call.

## 2018-11-09 ENCOUNTER — Telehealth: Payer: Self-pay

## 2018-11-09 NOTE — Telephone Encounter (Signed)
Pt's wife states a phone visit will be fine.      Virtual Visit Pre-Appointment Phone Call  "(Name), I am calling you today to discuss your upcoming appointment. We are currently trying to limit exposure to the virus that causes COVID-19 by seeing patients at home rather than in the office."  1. "What is the BEST phone number to call the day of the visit?" - include this in appointment notes  2. "Do you have or have access to (through a family member/friend) a smartphone with video capability that we can use for your visit?" a. If yes - list this number in appt notes as "cell" (if different from BEST phone #) and list the appointment type as a VIDEO visit in appointment notes b. If no - list the appointment type as a PHONE visit in appointment notes  Confirm consent - "In the setting of the current Covid19 crisis, you are scheduled for a (phone or video) visit with your provider on (date) at (time).  Just as we do with many in-office visits, in order for you to participate in this visit, we must obtain consent.  If you'd like, I can send this to your mychart (if signed up) or email for you to review.  Otherwise, I can obtain your verbal consent now.  All virtual visits are billed to your insurance company just like a normal visit would be.  By agreeing to a virtual visit, we'd like you to understand that the technology does not allow for your provider to perform an examination, and thus may limit your provider's ability to fully assess your condition. If your provider identifies any concerns that need to be evaluated in person, we will make arrangements to do so.  Finally, though the technology is pretty good, we cannot assure that it will always work on either your or our end, and in the setting of a video visit, we may have to convert it to a phone-only visit.  In either situation, we cannot ensure that we have a secure connection.  Are you willing to proceed?" STAFF: Did the patient verbally  acknowledge consent to telehealth visit? Document YES/NO here: YES  3. Advise patient to be prepared - "Two hours prior to your appointment, go ahead and check your blood pressure, pulse, oxygen saturation, and your weight (if you have the equipment to check those) and write them all down. When your visit starts, your provider will ask you for this information. If you have an Apple Watch or Kardia device, please plan to have heart rate information ready on the day of your appointment. Please have a pen and paper handy nearby the day of the visit as well."  4. Give patient instructions for MyChart download to smartphone OR Doximity/Doxy.me as below if video visit (depending on what platform provider is using)  5. Inform patient they will receive a phone call 15 minutes prior to their appointment time (may be from unknown caller ID) so they should be prepared to answer    TELEPHONE CALL NOTE  [name] Cummings has been deemed a candidate for a follow-up tele-health visit to limit community exposure during the Covid-19 pandemic. I spoke with the patient via phone to ensure availability of phone/video source, confirm preferred email & phone number, and discuss instructions and expectations.  I reminded [name] Cummings to be prepared with any vital sign and/or heart rhythm information that could potentially be obtained via home monitoring, at the time of his visit. I  reminded [name] Cummings to expect a phone call prior to his visit.  Fonnie Birkenhead, CMA 11/09/2018 9:11 AM   INSTRUCTIONS FOR DOWNLOADING THE MYCHART APP TO SMARTPHONE  - The patient must first make sure to have activated MyChart and know their login information - If Apple, go to [employer] and type in MyChart in the search bar and download the app. If Android, ask patient to go to [employer] and type in [place] in the search bar and download the app. The app is free but as with any other app downloads, their phone may require them to  verify saved payment information or Apple/Android password.  - The patient will need to then log into the app with their MyChart username and password, and select [facility] as their healthcare provider to link the account. When it is time for your visit, go to the MyChart app, find appointments, and click Begin Video Visit. Be sure to Select Allow for your device to access the Microphone and Camera for your visit. You will then be connected, and your provider will be with you shortly.  **If they have any issues connecting, or need assistance please contact MyChart service desk [phone])**  **If using a computer, in order to ensure the best quality for their visit they will need to use either of the following Internet Browsers: [employer] or Google Chrome**  IF USING DOXIMITY or DOXY.ME - The patient will receive a link just prior to their visit by text.     FULL LENGTH CONSENT FOR TELE-HEALTH VISIT   I hereby voluntarily request, consent and authorize [facility] and its employed or contracted physicians, physician assistants, nurse practitioners or other licensed health care professionals (the Practitioner), to provide me with telemedicine health care services (the "Services") as deemed necessary by the treating Practitioner. I acknowledge and consent to receive the Services by the Practitioner via telemedicine. I understand that the telemedicine visit will involve communicating with the Practitioner through live audiovisual communication technology and the disclosure of certain medical information by electronic transmission. I acknowledge that I have been given the opportunity to request an in-person assessment or other available alternative prior to the telemedicine visit and am voluntarily participating in the telemedicine visit.  I understand that I have the right to withhold or withdraw my consent to the use of telemedicine in the course of my care at any time,  without affecting my right to future care or treatment, and that the Practitioner or I may terminate the telemedicine visit at any time. I understand that I have the right to inspect all information obtained and/or recorded in the course of the telemedicine visit and may receive copies of available information for a reasonable fee.  I understand that some of the potential risks of receiving the Services via telemedicine include:  Marland Kitchen Delay or interruption in medical evaluation due to technological equipment failure or disruption; . Information transmitted may not be sufficient (e.g. poor resolution of images) to allow for appropriate medical decision making by the Practitioner; and/or  . In rare instances, security protocols could fail, causing a breach of personal health information.  Furthermore, I acknowledge that it is my responsibility to provide information about my medical history, conditions and care that is complete and accurate to the best of my ability. I acknowledge that Practitioner's advice, recommendations, and/or decision may be based on factors not within their control, such as incomplete or inaccurate data provided by me or distortions  of diagnostic images or specimens that may result from electronic transmissions. I understand that the practice of medicine is not an exact science and that Practitioner makes no warranties or guarantees regarding treatment outcomes. I acknowledge that I will receive a copy of this consent concurrently upon execution via email to the email address I last provided but may also request a printed copy by calling the office of [facility].    I understand that my insurance will be billed for this visit.   I have read or had this consent read to me. . I understand the contents of this consent, which adequately explains the benefits and risks of the Services being provided via telemedicine.  . I have been provided ample opportunity to ask questions regarding  this consent and the Services and have had my questions answered to my satisfaction. . I give my informed consent for the services to be provided through the use of telemedicine in my medical care  By participating in this telemedicine visit I agree to the above.

## 2018-11-12 ENCOUNTER — Telehealth (INDEPENDENT_AMBULATORY_CARE_PROVIDER_SITE_OTHER): Payer: 59 | Admitting: Cardiology

## 2018-11-12 VITALS — Ht 73.5 in | Wt 253.0 lb

## 2018-11-12 DIAGNOSIS — R079 Chest pain, unspecified: Secondary | ICD-10-CM

## 2018-11-12 DIAGNOSIS — R0602 Shortness of breath: Secondary | ICD-10-CM | POA: Diagnosis not present

## 2018-11-12 DIAGNOSIS — I2510 Atherosclerotic heart disease of native coronary artery without angina pectoris: Secondary | ICD-10-CM | POA: Diagnosis not present

## 2018-11-12 NOTE — Progress Notes (Signed)
Virtual Visit via Telephone Note   This visit type was conducted due to national recommendations for restrictions regarding the COVID-19 Pandemic (e.g. social distancing) in an effort to limit this patient's exposure and mitigate transmission in our community.  Due to his co-morbid illnesses, this patient is at least at moderate risk for complications without adequate follow up.  This format is felt to be most appropriate for this patient at this time.  The patient did not have access to video technology/had technical difficulties with video requiring transitioning to audio format only (telephone).  All issues noted in this document were discussed and addressed.  No physical exam could be performed with this format.  Please refer to the patient's chart for his  consent to telehealth for [facility].   Date:  11/12/2018   ID:  [name] Cummings, DOB 1960/10/16, MRN [id number]  Patient Location: Home Provider Location: Home  PCP:  Barbie Banner, MD  Cardiologist:  Dina Rich, MD  Electrophysiologist:  None   Evaluation Performed:  Follow-Up Visit  Chief Complaint:  4 month follow up  History of Present Illness:    [name] Cummings is a 58 y.o. male seen today for follow up of the following medical problems  History is obtained primarily from interview of his wife. Due to patient's history of anoxic brain injury and severe short term memory deficit  1. CAD - hx of BMS to LAD in 2007, followed by stent thrombosis with resulting VF arrest. LAD was restented at that time. - cath 2010 LAD 50% prox, 30% ISR in mid LAD, distal LAD 70-80%, LCX 40% ostial, 70-80% narrowing proximal portion LCX in the ramus [name] Cummings, RCA 70-80% proximal. LVEF 45%, the distal LAD was stented.  - cath Jan 2017 severe ISR of LAD stent, received another DES. Small OM1 subtotally occluded with left to left collaterals - echo Jan 2016 LVEF 60-65% - [facility] 08[facility]2015 with atypical chest pain, no evidence of  ACS. Imdur was added.     04/2017 nuclear stress: anterolateral infarct with mild peri-infarct ischemia. Large lateral/inferolateral/inferior/inferioapical defect with moderate ischemia. LVEF 30-44% by nuclear.  - 04/2017 echo LVEF 50-55%, multiple WMAs  -04/2017 cath severe LCX ostial disease, received DES. Moderate ISR of LAD stent. Severe ostial disease of small nondominant RCA.     07/2018 echo LVEF 55-60%, apex hypokinetic - was to have lexiscan after last visit, not completed - since last visit his prior chest pain and SOB has resolved  2. 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 Patient is not able to give description of symptoms, symptoms are all relayed through his wife.     The patient does not have symptoms concerning for COVID-19 infection (fever, chills, cough, or new shortness of breath).    Past Medical History:  Diagnosis Date  . Anxiety   . 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  . CAD (coronary artery disease)    a. 2007 Ant MI/VF Arrest/PCI LAD w/ BMS;  b. 07/2015 Cath/PCI: LM nl, LAD 80 ISR (2.5x28 Synergy DES), LCX min irregs, OM1 99, RCA 50p.  . Depression   . Dizziness   . Dysphagia, unspecified(787.20)   . GERD (gastroesophageal reflux disease)   . History of seizure disorder   . Hyperlipidemia, mixed   . Hypertensive heart disease    unspecified  . Ischemic cardiomyopathy    a. 06/2014 Echo: EF 60-65%.  . Memory loss  a. since VF arrest and anoxic brain injury in 2007.  . Nephrolithiasis   . 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   Past Surgical History:  Procedure Laterality Date  . bedside tracheostomy     with #6 Shiley. 04/14/2006  . CARDIAC CATHETERIZATION  09/2005   with bare metal stent to the mid LAD (Minivision 2.5 x 18 mm) with Provisional balloon angioplasty to the second diagonal.  . CARDIAC CATHETERIZATION  03/2006   with stenting of the mid LAD  with Taxus 2.5 x 20 mm and 2.5 x 8 mm stents.      Marland Kitchen CARDIAC CATHETERIZATION N/A 07/20/2015   Procedure: Left Heart Cath and Coronary Angiography;  Surgeon: Corky Crafts, MD;  Location: [facility] INVASIVE CV LAB;  Service: Cardiovascular;  Laterality: N/A;  . CARDIAC CATHETERIZATION N/A 07/20/2015   Procedure: Coronary Stent Intervention;  Surgeon: Corky Crafts, MD;  Location: [facility] INVASIVE CV LAB;  Service: Cardiovascular;  Laterality: N/A;  . CORONARY STENT INTERVENTION N/A 05/15/2017   Procedure: CORONARY STENT INTERVENTION;  Surgeon: Tonny Bollman, MD;  Location: [facility] INVASIVE CV LAB;  Service: Cardiovascular;  Laterality: N/A;  . CORONARY STENT PLACEMENT  07/20/2015   LAD with DES  . ESOPHAGOGASTRODUODENOSCOPY    . KIDNEY STONE SURGERY    . LEFT HEART CATH AND CORONARY ANGIOGRAPHY N/A 05/15/2017   Procedure: LEFT HEART CATH AND CORONARY ANGIOGRAPHY;  Surgeon: Tonny Bollman, MD;  Location: [facility] INVASIVE CV LAB;  Service: Cardiovascular;  Laterality: N/A;  . PEG TUBE PLACEMENT    . PEG TUBE REMOVAL    . stent thrombosis  03/2006   Complicated my sudden cardiac death  . TRACHEOSTOMY CLOSURE       Current Meds  Medication Sig  . aspirin EC 81 MG tablet Take 81 mg by mouth daily.  . busPIRone (BUSPAR) 5 MG tablet Take 5 mg by mouth 3 (three) times daily as needed.  . carvedilol (COREG) 25 MG tablet Take 1 tablet (25 mg total) 2 (two) times daily with a meal by mouth.  . clopidogrel (PLAVIX) 75 MG tablet Take 1 tablet (75 mg total) daily by mouth. Same day PCI  . desmopressin (DDAVP) 0.2 MG tablet Take 200 mcg by mouth at bedtime.  Marland Kitchen doxycycline (VIBRA-TABS) 100 MG tablet Take 100 mg by mouth 2 (two) times daily.  Marland Kitchen FLUoxetine (PROZAC) 20 MG capsule Take 20 mg by mouth at bedtime.   . fluticasone (FLONASE) 50 MCG/ACT nasal spray Place 1 spray into both nostrils daily as needed for allergies.   . furosemide (LASIX) 20 MG tablet Take 1 tablet (20 mg total) daily as needed by mouth for  fluid.  Marland Kitchen HYDROcodone-acetaminophen (NORCO/VICODIN) 5-325 MG tablet TAKE 1 TABLET BY MOUTH 4 TIMES DAILY AS NEEDED  . loratadine (CLARITIN) 10 MG tablet Take 10 mg daily as needed by mouth for allergies.   . nitroGLYCERIN (NITROSTAT) 0.4 MG SL tablet Place 1 tablet (0.4 mg total) every 5 (five) minutes as needed under the tongue for chest pain.  . pantoprazole (PROTONIX) 40 MG tablet Take 1 tablet (40 mg total) daily by mouth.  . pravastatin (PRAVACHOL) 20 MG tablet Take 1 tablet (20 mg total) every evening by mouth.  . predniSONE (DELTASONE) 20 MG tablet Take 20 mg by mouth 2 (two) times daily.  . VENTOLIN HFA 108 (90 Base) MCG/ACT inhaler INHALE 1 TO 2 PUFFS BY MOUTH 4 TIMES DAILY AS NEEDED  . VIRTUSSIN A/C 100-10 MG/5ML syrup TAKE 10 ML  BY MOUTH THREE TIMES DAILY AS NEEDED     Allergies:   Hydromorphone hcl; Lorazepam; Pseudoephedrine; Terfenadine; and Atorvastatin   Social History   Tobacco Use  . Smoking status: Never Smoker  . Smokeless tobacco: Former Neurosurgeon    Types: Chew  Substance Use Topics  . Alcohol use: No    Alcohol/week: 0.0 standard drinks  . Drug use: No     Family Hx: The patient's family history includes Cancer in his father; Diabetes in his mother and sister; Heart attack in his father and mother; Hydrocephalus in his mother. There is no history of Coronary artery disease.  ROS:   Please see the history of present illness.     All other systems reviewed and are negative.   Prior CV studies:   The following studies were reviewed today:  11/2008 Cath RESULTS: Left main coronary artery: The left main coronary artery was free of significant disease.  Left anterior descending: The left anterior descending artery gave rise to small diagonal [name] Cummings and small septal perforator, and larger diagonal [name] Cummings. There was 30% narrowing within the midportion of the previously placed stents. Distal to the stent, there was an eccentric lesion, which appeared  to be a ruptured plaque, which was estimated to be 70-80% obstructive. There also was 40-50% diffuse narrowing in the proximal LAD.  Circumflex artery: The circumflex artery gave rise to a large ramus [name] Cummings and two posterolateral branches and a posterior descending [name] Cummings. This was a dominant vessel. There was 70-80% narrowing in the proximal portion in the ramus Yaseen Gilberg which was extended over about 18 mm. There was also a 40% ostial narrowing in the circumflex artery and 50% narrowing in the ostium of the ramus Azell Bill.  Right coronary artery: The right coronary artery was a nondominant vessel which supplied only right ventricular branches. There was a 70- 80% proximal stenosis.  Left ventriculogram: The left ventriculogram was performed in the RAO projection showed hypokinesis of the anterolateral wall and apex. The estimated fraction was 45%.  Following stenting of the lesion in the mid LAD, the stenosis improved from 80% to 0%.  CONCLUSIONS: 1. Coronary artery disease, status post previous percutaneous coronary  interventions as described above with 40% narrowing in the proximal  LAD, 30% narrowing within the stent in the mid LAD, and 80%  narrowing in the mid LAD after the stent, 70-80% narrowing in the  large ramus Alysabeth Scalia of the circumflex artery with 40% ostial  stenosis in the dominant circumflex artery, and 80% narrowing in  the proximal portion of a nondominant right coronary artery with  anterolateral wall and apical wall hypokinesis and estimated  ejection fraction of 45%. 2. Successful PCI of the lesion in the mid LAD distal to the  previously placed stent using a XIENCE drug-eluting stent with  improvement in center narrowing from 80% to 0%.  DISPOSITION: The patient returned to [facility] room for further observation.    Jan 2016 echo Study Conclusions  - Left ventricle: The  cavity size was normal. Wall thickness was normal. Systolic function was normal. The estimated ejection fraction was in the range of 60% to 65%. Indeterminant diastolic function. - Aortic valve: Mildly calcified annulus. Mildly thickened leaflets. - Mitral valve: Mildly calcified annulus. Mildly thickened leaflets . - Left atrium: The atrium was mildly dilated. - Systemic veins: IVC is small, suggestive of low RA pressure and hypovolemia. - Technically adequate study.   07/10/15 Clinic EKG (performed and reviewed in clinic): Sinus bradycardia  Jan  2017 Lexiscan MPI  Perfusion Summary Defect 1:  There is a large defect of moderate severity present in the basal anterolateral, mid anterolateral and apical lateral location. The defect is partially reversible. Large, moderate intensity, partially reversible anterolateral defect consistent with scar and at least moderate peri-infarct ischemia.    Overall Study Impression Myocardial perfusion is abnormal. This is a high risk study. Overall left ventricular systolic function was abnormal. Nuclear stress EF: 43%.      Jan 2017 cath  OM1 subtotally occluded with left to left collaterals coming from the apical LAD.  Severe in-stent restenosis of prior bare-metal stent which was placed in 2007 in the setting of his cardiac arrest. This was treated with cutting balloon angioplasty anddrug-eluting stent placement with a 2.5 x 28 Synergy drug-eluting stent, postdilatto greater than 3 m  Continue dual antiplatelet therapy indefinitely. The patient has significant memory loss after his cardiac arrest. Will discuss with his wife about the importance of staying on his dual antiplatelet therapy.   He'll be watched overnight with anticipated discharge tomorrow  04/2017 Nuclear stress  There was no ST segment deviation noted during stress.  Findings consistent with prior anterolateral myocardial infarction with mild  peri-infarct ischemia. Large lateral/inferolatera/inferior/inferoapical defect with moderate ischemia  This is a high risk study. High risk due to low ejection fraction and multiple areas of ischemia. Mild area of ischemia anterolateral. Moderate area of ischemia lateral/inferior walls  The left ventricular ejection fraction is moderately decreased (30-44%).   04/2017 cath 1. Severe ostial left circumflex stenosis treated successfully with drug-eluting stent implantation (3.5 x 28 mm Sierra DES postdilated with a 4.0 mm noncompliant balloon) 2. Moderate diffuse in-stent restenosis of the LAD 3. Severe stenosis of the RCA ostium (nondominant vessel) 4. Normal LVEDP  Continue long-term dual antiplatelet therapy with aspirin and clopidogrel. As long as no complications arise patient is eligible for same day discharge.  04/2017 echo Study Conclusions  - Left ventricle: The cavity size was normal. Wall thickness was increased in a pattern of mild LVH. Systolic function was normal. The estimated ejection fraction was in the range of 50% to 55%. Left ventricular diastolic function parameters were normal. - Regional wall motion abnormality: Hypokinesis of the apical anterior, apical inferior, and apical myocardium. - Aortic valve: Valve area (VTI): 3.01 cm^2. Valve area (Vmax): 3.29 cm^2. - Technically difficult study.  07/2018 echo IMPRESSIONS    1. The left ventricle has normal systolic function, with an ejection fraction of 55-60%. The cavity size was normal. There is mildly increased left ventricular wall thickness. Left ventricular diastolic parameters were normal.The apex is hypokinetic  2. The right ventricle has normal systolic function. The cavity was normal. There is no increase in right ventricular wall thickness.  3. Left atrial size was mildly dilated.  4. Small pericardial effusion.  5. The mitral valve is normal in structure. No evidence of mitral valve  stenosis.  6. The tricuspid valve is normal in structure.  7. The aortic valve is tricuspid no stenosis of the aortic valve.  8. The aortic root is normal in size and structure.  9. Pulmonary hypertension is indeterminant, inadequate TR jet. 10. Right atrial pressure is estimated at 3 mmHg.    Labs/Other Tests and Data Reviewed:    EKG:  No ECG reviewed.  Recent Labs: 08/28/2018: ALT 106; BUN 17; Creatinine, Ser 1.09; Hemoglobin 12.6; Platelets 226; Potassium 3.6; Sodium 129   Recent Lipid Panel Lab Results  Component Value Date/Time   CHOL  197 04/21/2017 04:17 PM   TRIG 359 (H) 04/21/2017 04:17 PM   HDL 41 04/21/2017 04:17 PM   CHOLHDL 4.8 04/21/2017 04:17 PM   LDLCALC 84 04/21/2017 04:17 PM    Wt Readings from Last 3 Encounters:  11/12/18 253 lb (114.8 kg)  08/27/18 250 lb (113.4 kg)  07/29/18 251 lb (113.9 kg)     Objective:    Vital Signs:  Ht 6' 1.5" (1.867 m)   Wt 253 lb (114.8 kg)   BMI 32.93 kg/m    BP 120/71   Normal affect. Normal speech pattern and tone. No audibles signs of SOB or wheezing. Comfortable, no apparent distress.   ASSESSMENT & PLAN:    1. CAD/Chest pain/SOB - evaluation has always been complicated by his severe memory deficit as well as chronic left sided chest pain combined with his extensive prior history of CAD - prior symptoms have since resolved, we will hold off on obtaining the stress test ordered at last visit. Continue to monitor    COVID-19 Education: The signs and symptoms of COVID-19 were discussed with the patient and how to seek care for testing (follow up with PCP or arrange E-visit).  The importance of social distancing was discussed today.  Time:   Today, I have spent 12 minutes with the patient with telehealth technology discussing the above problems.     Medication Adjustments/Labs and Tests Ordered: Current medicines are reviewed at length with the patient today.  Concerns regarding medicines are outlined above.    Tests Ordered: No orders of the defined types were placed in this encounter.   Medication Changes: No orders of the defined types were placed in this encounter.   Disposition:  Follow up 4 months  Signed, Dina RichBranch, Dariush Mcnellis, MD  11/12/2018 3:04 PM    [facility]

## 2018-11-12 NOTE — Progress Notes (Signed)
Medication Instructions:   Your physician recommends that you continue on your current medications as directed. Please refer to the Current Medication list given to you today.  Labwork:  none  Testing/Procedures:  none  Follow-Up:  Your physician recommends that you schedule a follow-up appointment in: 4 months.   Any Other Special Instructions Will Be Listed Below (If Applicable).  If you need a refill on your cardiac medications before your next appointment, please call your pharmacy. 

## 2019-05-06 ENCOUNTER — Telehealth: Payer: Self-pay | Admitting: Student

## 2019-05-06 ENCOUNTER — Telehealth (INDEPENDENT_AMBULATORY_CARE_PROVIDER_SITE_OTHER): Payer: 59 | Admitting: Student

## 2019-05-06 ENCOUNTER — Encounter: Payer: Self-pay | Admitting: Student

## 2019-05-06 VITALS — Ht 73.5 in | Wt 256.0 lb

## 2019-05-06 DIAGNOSIS — E785 Hyperlipidemia, unspecified: Secondary | ICD-10-CM

## 2019-05-06 DIAGNOSIS — I25118 Atherosclerotic heart disease of native coronary artery with other forms of angina pectoris: Secondary | ICD-10-CM

## 2019-05-06 DIAGNOSIS — R0609 Other forms of dyspnea: Secondary | ICD-10-CM

## 2019-05-06 DIAGNOSIS — G931 Anoxic brain damage, not elsewhere classified: Secondary | ICD-10-CM

## 2019-05-06 DIAGNOSIS — I10 Essential (primary) hypertension: Secondary | ICD-10-CM

## 2019-05-06 NOTE — Telephone Encounter (Signed)
Pre-cert Verification for the following procedure    LEXISCAN scheduled for 05-13-2019 at [facility]

## 2019-05-06 NOTE — Progress Notes (Signed)
Virtual Visit via Telephone Note   This visit type was conducted due to national recommendations for restrictions regarding the COVID-19 Pandemic (e.g. social distancing) in an effort to limit this patient's exposure and mitigate transmission in our community.  Due to his co-morbid illnesses, this patient is at least at moderate risk for complications without adequate follow up.  This format is felt to be most appropriate for this patient at this time.  The patient did not have access to video technology/had technical difficulties with video requiring transitioning to audio format only (telephone).  All issues noted in this document were discussed and addressed.  No physical exam could be performed with this format.  Please refer to the patient's chart for his  consent to [facility]health [facility].   Date:  05/06/2019   ID:  [name] PumaJesse D Cummings, DOB 08/20/1960, MRN [id number]  Patient Location: Home Provider Location: Office  PCP:  Barbie BannerWilson, Fred H, MD  Cardiologist:  Dina RichBranch, Jonathan, MD  Electrophysiologist:  None   Evaluation Performed:  Follow-Up Visit  Chief Complaint: Dyspnea on exertion and Fatigue  History of Present Illness:    [name] Cummings is a 58 y.o. 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   He most recently had a telehealth visit with Dr. Wyline MoodBranch in 10/2018 and denied any recurrent chest pain or dyspnea since his last office visit. A stress test had been ordered at that time but not pursued due to COVID-19. Given no recurrent symptoms, was recommended to hold off on stress testing at that time. Was continued on his current medication  regimen including ASA, Plavix, Coreg 25mg  BID, Pravastatin 20mg  daily and PRN Lasix.   In talking with the patient today, most history is provided by his wife given his anoxic brain injury and short-term memory loss. She reports that he has started to experience worsening dyspnea on exertion over the past 2 to 3 months and is more fatigued. She says that his energy level has declined and at times he refuses to walk to the restroom and uses a urinal instead. He denies any specific chest pain.  No recent orthopnea, PND or lower extremity edema. She is concerned about his dyspnea as this resembles his prior anginal symptoms at the time of recent stent placement in 2017 and 2018.  The patient does not have symptoms concerning for COVID-19 infection (fever, chills, cough, or new shortness of breath).    Past Medical History:  Diagnosis Date  . Anxiety   . 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  . CAD (coronary artery disease)    a. s/p BMS to LAD in 2007 with stent thrombosis and VF arrest and LAD re-stented b. DES to distal LAD in 2010 c. ISR and DES to LAD in 2017 d. DES to LCx in 04/2017 and severe ostial stenosis of small RCA  . Depression   . Dizziness   . Dysphagia, unspecified(787.20)   . GERD (gastroesophageal reflux disease)   . History of seizure disorder   . Hyperlipidemia, mixed   . Hypertensive heart disease  unspecified  . Ischemic cardiomyopathy    a. 06/2014 Echo: EF 60-65%.  . 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  . Nephrolithiasis   . 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 2007->VF Arrest.   Past Surgical History:  Procedure Laterality Date  . bedside tracheostomy     with #6 Shiley. 04/14/2006  . CARDIAC CATHETERIZATION  09/2005   with bare metal stent to the mid LAD (Minivision 2.5 x 18 mm) with Provisional balloon angioplasty to the second diagonal.  . CARDIAC CATHETERIZATION  03/2006   with stenting of the mid  LAD with Taxus 2.5 x 20 mm and 2.5 x 8 mm stents.      Marland Kitchen CARDIAC CATHETERIZATION N/A 07/20/2015   Procedure: Left Heart Cath and Coronary Angiography;  Surgeon: [name], MD;  Location: [facility];  Service: Cardiovascular;  Laterality: N/A;  . CARDIAC CATHETERIZATION N/A 07/20/2015   Procedure: Coronary Stent Intervention;  Surgeon: [name], MD;  Location: [facility];  Service: Cardiovascular;  Laterality: N/A;  . CORONARY STENT INTERVENTION N/A 05/15/2017   Procedure: CORONARY STENT INTERVENTION;  Surgeon: [name], MD;  Location: [facility];  Service: Cardiovascular;  Laterality: N/A;  . CORONARY STENT PLACEMENT  07/20/2015   LAD with DES  . ESOPHAGOGASTRODUODENOSCOPY    . KIDNEY STONE SURGERY    . LEFT HEART CATH AND CORONARY ANGIOGRAPHY N/A 05/15/2017   Procedure: LEFT HEART CATH AND CORONARY ANGIOGRAPHY;  Surgeon: [name], MD;  Location: [place] [facility];  Service: Cardiovascular;  Laterality: N/A;  . PEG TUBE PLACEMENT    . PEG TUBE REMOVAL    . stent thrombosis  08/4095   Complicated my sudden cardiac death  . TRACHEOSTOMY CLOSURE       Current Meds  Medication Sig  . aspirin EC 81 MG tablet Take 81 mg by mouth daily.  . busPIRone (BUSPAR) 5 MG tablet Take 5 mg by mouth 3 (three) times daily as needed.  . carvedilol (COREG) 25 MG tablet Take 1 tablet (25 mg total) 2 (two) times daily with a meal by mouth.  . clopidogrel (PLAVIX) 75 MG tablet Take 1 tablet (75 mg total) daily by mouth. Same day PCI  . desmopressin (DDAVP) 0.2 MG tablet Take 200 mcg by mouth at bedtime.  Marland Kitchen FLUoxetine (PROZAC) 20 MG capsule Take 20 mg by mouth at bedtime.   . fluticasone (FLONASE) 50 MCG/ACT nasal spray Place 1 spray into both nostrils daily as needed for allergies.   Marland Kitchen HYDROcodone-acetaminophen (NORCO/VICODIN) 5-325 MG tablet TAKE 1 TABLET BY MOUTH 4 TIMES DAILY AS NEEDED  . loratadine (CLARITIN) 10 MG tablet Take 10 mg daily as needed  by mouth for allergies.   . nitroGLYCERIN (NITROSTAT) 0.4 MG SL tablet Place 1 tablet (0.4 mg total) every 5 (five) minutes as needed under the tongue for chest pain.  Marland Kitchen oxybutynin (DITROPAN) 5 MG tablet Take 5 mg by mouth 2 (two) times daily.  . pantoprazole (PROTONIX) 40 MG tablet Take 1 tablet (40 mg total) daily by mouth.  . pravastatin (PRAVACHOL) 20 MG tablet Take 1 tablet (20 mg total) every evening by mouth.  . VENTOLIN HFA 108 (90 Base) MCG/ACT inhaler INHALE 1 TO 2 PUFFS BY MOUTH 4 TIMES DAILY AS NEEDED  . [DISCONTINUED] doxycycline (VIBRA-TABS) 100 MG tablet Take 100 mg by mouth 2 (two) times daily.  . [DISCONTINUED] furosemide (LASIX) 20 MG tablet Take  1 tablet (20 mg total) daily as needed by mouth for fluid.  . [DISCONTINUED] predniSONE (DELTASONE) 20 MG tablet Take 20 mg by mouth 2 (two) times daily.  . [DISCONTINUED] VIRTUSSIN A/C 100-10 MG/5ML syrup TAKE 10 ML BY MOUTH THREE TIMES DAILY AS NEEDED     Allergies:   Crestor [rosuvastatin calcium], Hydromorphone hcl, Lorazepam, Pseudoephedrine, Terfenadine, and Atorvastatin   Social History   Tobacco Use  . Smoking status: Never Smoker  . Smokeless tobacco: Former Neurosurgeon    Types: Chew  Substance Use Topics  . Alcohol use: No    Alcohol/week: 0.0 standard drinks  . Drug use: No     Family Hx: The patient's family history includes Cancer in his father; Diabetes in his mother and sister; Heart attack in his father and mother; Hydrocephalus in his mother. There is no history of Coronary artery disease.  ROS:   Please see the history of present illness.     All other systems reviewed and are negative.   Prior CV studies:   The following studies were reviewed today:  Cardiac Catheterization: 04/2017 1.  Severe ostial left circumflex stenosis treated successfully with drug-eluting stent implantation (3.5 x 28 mm Sierra DES postdilated with a 4.0 mm noncompliant balloon) 2.  Moderate diffuse in-stent restenosis of the LAD  3.  Severe stenosis of the RCA ostium (nondominant vessel) 4.  Normal LVEDP  Continue long-term dual antiplatelet therapy with aspirin and clopidogrel.  As long as no complications arise patient is eligible for same day discharge.  Echocardiogram: 07/2018 IMPRESSIONS    1. The left ventricle has normal systolic function, with an ejection fraction of 55-60%. The cavity size was normal. There is mildly increased left ventricular wall thickness. Left ventricular diastolic parameters were normal.The apex is hypokinetic  2. The right ventricle has normal systolic function. The cavity was normal. There is no increase in right ventricular wall thickness.  3. Left atrial size was mildly dilated.  4. Small pericardial effusion.  5. The mitral valve is normal in structure. No evidence of mitral valve stenosis.  6. The tricuspid valve is normal in structure.  7. The aortic valve is tricuspid no stenosis of the aortic valve.  8. The aortic root is normal in size and structure.  9. Pulmonary hypertension is indeterminant, inadequate TR jet. 10. Right atrial pressure is estimated at 3 mmHg.  Labs/Other Tests and Data Reviewed:    EKG:  No ECG reviewed.  Recent Labs: 08/28/2018: ALT 106; BUN 17; Creatinine, Ser 1.09; Hemoglobin 12.6; Platelets 226; Potassium 3.6; Sodium 129   Recent Lipid Panel Lab Results  Component Value Date/Time   CHOL 197 04/21/2017 04:17 PM   TRIG 359 (H) 04/21/2017 04:17 PM   HDL 41 04/21/2017 04:17 PM   CHOLHDL 4.8 04/21/2017 04:17 PM   LDLCALC 84 04/21/2017 04:17 PM    Wt Readings from Last 3 Encounters:  05/06/19 256 lb (116.1 kg)  11/12/18 253 lb (114.8 kg)  08/27/18 250 lb (113.4 kg)     Objective:    Vital Signs:  Ht 6' 1.5" (1.867 m)   Wt 256 lb (116.1 kg)   BMI 33.32 kg/m    General: Pleasant male sounding in NAD. Patient talking in the background and sounds in no acute distress. Most of the conversation is with his wife secondary to his memory  loss.    ASSESSMENT & PLAN:    1. CAD/ Dyspnea on Exertion - he is s/p BMS to LAD in 2007 with  stent thrombosis and VF arrest and LAD re-stented later that year, DES to distal LAD in 2010, ISR and DES to LAD in 2017 and most recently DES to LCx in 04/2017 and severe ostial stenosis of small RCA at that time.  - The patient's wife has noticed he is now experiencing worsening dyspnea on exertion and fatigue which resembles his prior anginal symptoms. Was initially recommended to undergo stress testing earlier this year but was postponed given COVID-19. Will arrange for a Lexiscan Myoview for ischemic evaluation. - Continue current regimen at this time with ASA, Plavix, Coreg and statin therapy.   2. HTN - BP has been well controlled when checked at home and this will be checked again at the time of his upcoming stress test. Continue current regimen with Coreg 25 mg twice daily.    3. HLD - Followed by PCP. His wife believes his most recent LDL was elevated to greater than 130. He is currently on Pravastatin 20 mg daily and was switched to Crestor earlier this year but developed significant myalgias with this. LDL was in the 80's with Pravastatin a few years ago. Will request a copy of most recent labs. Would recommend obtaining a repeat FLP in 2 months. If LDL above goal, would titrate Pravastatin to 40 mg daily.    4. Anoxic brain injury - He does have short-term memory loss and most history is provided by his wife. She will need to be present at the time of his stress test to consent to the study.    COVID-19 Education: The signs and symptoms of COVID-19 were discussed with the patient and how to seek care for testing (follow up with PCP or arrange E-visit).  The importance of social distancing was discussed today.  Time:   Today, I have spent 17 minutes with the patient with telehealth technology discussing the above problems.     Medication Adjustments/Labs and Tests Ordered: Current  medicines are reviewed at length with the patient today.  Concerns regarding medicines are outlined above.   Tests Ordered: Orders Placed This Encounter  Procedures  . NM Myocar Multi W/Spect W/Wall Motion / EF    Medication Changes: No orders of the defined types were placed in this encounter.   Follow Up: IN PERSON IN 2-3 MONTHS WITH DR. BRANCH  Signed, Ellsworth Lennox, [place]  05/06/2019 5:23 PM    [facility]

## 2019-05-06 NOTE — Patient Instructions (Signed)
Medication Instructions:  Your physician recommends that you continue on your current medications as directed. Please refer to the Current Medication list given to you today.   Labwork: I WILL REQUEST LABS FROM PCP  Testing/Procedures: Your physician has requested that you have a lexiscan myoview. For further information please visit [email or web address]. Please follow instruction sheet, as given.    Follow-Up: Your physician recommends that you schedule a follow-up appointment in: 2-3 MONTHS WITH DR. BRANCH    Any Other Special Instructions Will Be Listed Below (If Applicable).     If you need a refill on your cardiac medications before your next appointment, please call your pharmacy.

## 2019-05-06 NOTE — H&P (View-Only) (Signed)
Virtual Visit via Telephone Note   This visit type was conducted due to national recommendations for restrictions regarding the COVID-19 Pandemic (e.g. social distancing) in an effort to limit this patient's exposure and mitigate transmission in our community.  Due to his co-morbid illnesses, this patient is at least at moderate risk for complications without adequate follow up.  This format is felt to be most appropriate for this patient at this time.  The patient did not have access to video technology/had technical difficulties with video requiring transitioning to audio format only (telephone).  All issues noted in this document were discussed and addressed.  No physical exam could be performed with this format.  Please refer to the patient's chart for his  consent to telehealth for [facility].   Date:  05/06/2019   ID:  [name] Cummings, DOB 08/20/1960, MRN [id number]  Patient Location: Home Provider Location: Office  PCP:  Barbie BannerWilson, Fred H, MD  Cardiologist:  Dina RichBranch, Jonathan, MD  Electrophysiologist:  None   Evaluation Performed:  Follow-Up Visit  Chief Complaint: Dyspnea on exertion and Fatigue  History of Present Illness:    [name] PumaJesse D Linskey is a 58 y.o. 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   He most recently had a telehealth visit with Dr. Wyline MoodBranch in 10/2018 and denied any recurrent chest pain or dyspnea since his last office visit. A stress test had been ordered at that time but not pursued due to COVID-19. Given no recurrent symptoms, was recommended to hold off on stress testing at that time. Was continued on his current medication  regimen including ASA, Plavix, Coreg 25mg  BID, Pravastatin 20mg  daily and PRN Lasix.   In talking with the patient today, most history is provided by his wife given his anoxic brain injury and short-term memory loss. She reports that he has started to experience worsening dyspnea on exertion over the past 2 to 3 months and is more fatigued. She says that his energy level has declined and at times he refuses to walk to the restroom and uses a urinal instead. He denies any specific chest pain.  No recent orthopnea, PND or lower extremity edema. She is concerned about his dyspnea as this resembles his prior anginal symptoms at the time of recent stent placement in 2017 and 2018.  The patient does not have symptoms concerning for COVID-19 infection (fever, chills, cough, or new shortness of breath).    Past Medical History:  Diagnosis Date  . Anxiety   . 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  . CAD (coronary artery disease)    a. s/p BMS to LAD in 2007 with stent thrombosis and VF arrest and LAD re-stented b. DES to distal LAD in 2010 c. ISR and DES to LAD in 2017 d. DES to LCx in 04/2017 and severe ostial stenosis of small RCA  . Depression   . Dizziness   . Dysphagia, unspecified(787.20)   . GERD (gastroesophageal reflux disease)   . History of seizure disorder   . Hyperlipidemia, mixed   . Hypertensive heart disease  unspecified  . Ischemic cardiomyopathy    a. 06/2014 Echo: EF 60-65%.  . 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  . Nephrolithiasis   . 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 2007->VF Arrest.   Past Surgical History:  Procedure Laterality Date  . bedside tracheostomy     with #6 [place]. 04/14/2006  . CARDIAC CATHETERIZATION  09/2005   with bare metal stent to the mid LAD (Minivision 2.5 x 18 mm) with Provisional balloon angioplasty to the second diagonal.  . CARDIAC CATHETERIZATION  03/2006   with stenting of the mid  LAD with Taxus 2.5 x 20 mm and 2.5 x 8 mm stents.      Marland Kitchen CARDIAC CATHETERIZATION N/A 07/20/2015   Procedure: Left Heart Cath and Coronary Angiography;  Surgeon: [name], MD;  Location: [facility];  Service: Cardiovascular;  Laterality: N/A;  . CARDIAC CATHETERIZATION N/A 07/20/2015   Procedure: Coronary Stent Intervention;  Surgeon: [name], MD;  Location: [facility];  Service: Cardiovascular;  Laterality: N/A;  . CORONARY STENT INTERVENTION N/A 05/15/2017   Procedure: CORONARY STENT INTERVENTION;  Surgeon: [name], MD;  Location: [facility];  Service: Cardiovascular;  Laterality: N/A;  . CORONARY STENT PLACEMENT  07/20/2015   LAD with DES  . ESOPHAGOGASTRODUODENOSCOPY    . KIDNEY STONE SURGERY    . LEFT HEART CATH AND CORONARY ANGIOGRAPHY N/A 05/15/2017   Procedure: LEFT HEART CATH AND CORONARY ANGIOGRAPHY;  Surgeon: [name], MD;  Location: [facility];  Service: Cardiovascular;  Laterality: N/A;  . PEG TUBE PLACEMENT    . PEG TUBE REMOVAL    . stent thrombosis  08/4095   Complicated my sudden cardiac death  . TRACHEOSTOMY CLOSURE       Current Meds  Medication Sig  . aspirin EC 81 MG tablet Take 81 mg by mouth daily.  . busPIRone (BUSPAR) 5 MG tablet Take 5 mg by mouth 3 (three) times daily as needed.  . carvedilol (COREG) 25 MG tablet Take 1 tablet (25 mg total) 2 (two) times daily with a meal by mouth.  . clopidogrel (PLAVIX) 75 MG tablet Take 1 tablet (75 mg total) daily by mouth. Same day PCI  . desmopressin (DDAVP) 0.2 MG tablet Take 200 mcg by mouth at bedtime.  Marland Kitchen FLUoxetine (PROZAC) 20 MG capsule Take 20 mg by mouth at bedtime.   . fluticasone (FLONASE) 50 MCG/ACT nasal spray Place 1 spray into both nostrils daily as needed for allergies.   Marland Kitchen HYDROcodone-acetaminophen (NORCO/VICODIN) 5-325 MG tablet TAKE 1 TABLET BY MOUTH 4 TIMES DAILY AS NEEDED  . loratadine (CLARITIN) 10 MG tablet Take 10 mg daily as needed  by mouth for allergies.   . nitroGLYCERIN (NITROSTAT) 0.4 MG SL tablet Place 1 tablet (0.4 mg total) every 5 (five) minutes as needed under the tongue for chest pain.  Marland Kitchen oxybutynin (DITROPAN) 5 MG tablet Take 5 mg by mouth 2 (two) times daily.  . pantoprazole (PROTONIX) 40 MG tablet Take 1 tablet (40 mg total) daily by mouth.  . pravastatin (PRAVACHOL) 20 MG tablet Take 1 tablet (20 mg total) every evening by mouth.  . VENTOLIN HFA 108 (90 Base) MCG/ACT inhaler INHALE 1 TO 2 PUFFS BY MOUTH 4 TIMES DAILY AS NEEDED  . [DISCONTINUED] doxycycline (VIBRA-TABS) 100 MG tablet Take 100 mg by mouth 2 (two) times daily.  . [DISCONTINUED] furosemide (LASIX) 20 MG tablet Take  1 tablet (20 mg total) daily as needed by mouth for fluid.  . [DISCONTINUED] predniSONE (DELTASONE) 20 MG tablet Take 20 mg by mouth 2 (two) times daily.  . [DISCONTINUED] VIRTUSSIN A/C 100-10 MG/5ML syrup TAKE 10 ML BY MOUTH THREE TIMES DAILY AS NEEDED     Allergies:   Crestor [rosuvastatin calcium], Hydromorphone hcl, Lorazepam, Pseudoephedrine, Terfenadine, and Atorvastatin   Social History   Tobacco Use  . Smoking status: Never Smoker  . Smokeless tobacco: Former User    Types: Chew  Substance Use Topics  . Alcohol use: No    Alcohol/week: 0.0 standard drinks  . Drug use: No     Family Hx: The patient's family history includes Cancer in his father; Diabetes in his mother and sister; Heart attack in his father and mother; Hydrocephalus in his mother. There is no history of Coronary artery disease.  ROS:   Please see the history of present illness.     All other systems reviewed and are negative.   Prior CV studies:   The following studies were reviewed today:  Cardiac Catheterization: 04/2017 1.  Severe ostial left circumflex stenosis treated successfully with drug-eluting stent implantation (3.5 x 28 mm Sierra DES postdilated with a 4.0 mm noncompliant balloon) 2.  Moderate diffuse in-stent restenosis of the LAD  3.  Severe stenosis of the RCA ostium (nondominant vessel) 4.  Normal LVEDP  Continue long-term dual antiplatelet therapy with aspirin and clopidogrel.  As long as no complications arise patient is eligible for same day discharge.  Echocardiogram: 07/2018 IMPRESSIONS    1. The left ventricle has normal systolic function, with an ejection fraction of 55-60%. The cavity size was normal. There is mildly increased left ventricular wall thickness. Left ventricular diastolic parameters were normal.The apex is hypokinetic  2. The right ventricle has normal systolic function. The cavity was normal. There is no increase in right ventricular wall thickness.  3. Left atrial size was mildly dilated.  4. Small pericardial effusion.  5. The mitral valve is normal in structure. No evidence of mitral valve stenosis.  6. The tricuspid valve is normal in structure.  7. The aortic valve is tricuspid no stenosis of the aortic valve.  8. The aortic root is normal in size and structure.  9. Pulmonary hypertension is indeterminant, inadequate TR jet. 10. Right atrial pressure is estimated at 3 mmHg.  Labs/Other Tests and Data Reviewed:    EKG:  No ECG reviewed.  Recent Labs: 08/28/2018: ALT 106; BUN 17; Creatinine, Ser 1.09; Hemoglobin 12.6; Platelets 226; Potassium 3.6; Sodium 129   Recent Lipid Panel Lab Results  Component Value Date/Time   CHOL 197 04/21/2017 04:17 PM   TRIG 359 (H) 04/21/2017 04:17 PM   HDL 41 04/21/2017 04:17 PM   CHOLHDL 4.8 04/21/2017 04:17 PM   LDLCALC 84 04/21/2017 04:17 PM    Wt Readings from Last 3 Encounters:  05/06/19 256 lb (116.1 kg)  11/12/18 253 lb (114.8 kg)  08/27/18 250 lb (113.4 kg)     Objective:    Vital Signs:  Ht 6' 1.5" (1.867 m)   Wt 256 lb (116.1 kg)   BMI 33.32 kg/m    General: Pleasant male sounding in NAD. Patient talking in the background and sounds in no acute distress. Most of the conversation is with his wife secondary to his memory  loss.    ASSESSMENT & PLAN:    1. CAD/ Dyspnea on Exertion - he is s/p BMS to LAD in 2007 with   stent thrombosis and VF arrest and LAD re-stented later that year, DES to distal LAD in 2010, ISR and DES to LAD in 2017 and most recently DES to LCx in 04/2017 and severe ostial stenosis of small RCA at that time.  - The patient's wife has noticed he is now experiencing worsening dyspnea on exertion and fatigue which resembles his prior anginal symptoms. Was initially recommended to undergo stress testing earlier this year but was postponed given COVID-19. Will arrange for a Lexiscan Myoview for ischemic evaluation. - Continue current regimen at this time with ASA, Plavix, Coreg and statin therapy.   2. HTN - BP has been well controlled when checked at home and this will be checked again at the time of his upcoming stress test. Continue current regimen with Coreg 25 mg twice daily.    3. HLD - Followed by PCP. His wife believes his most recent LDL was elevated to greater than 130. He is currently on Pravastatin 20 mg daily and was switched to Crestor earlier this year but developed significant myalgias with this. LDL was in the 80's with Pravastatin a few years ago. Will request a copy of most recent labs. Would recommend obtaining a repeat FLP in 2 months. If LDL above goal, would titrate Pravastatin to 40 mg daily.    4. Anoxic brain injury - He does have short-term memory loss and most history is provided by his wife. She will need to be present at the time of his stress test to consent to the study.    COVID-19 Education: The signs and symptoms of COVID-19 were discussed with the patient and how to seek care for testing (follow up with PCP or arrange E-visit).  The importance of social distancing was discussed today.  Time:   Today, I have spent 17 minutes with the patient with telehealth technology discussing the above problems.     Medication Adjustments/Labs and Tests Ordered: Current  medicines are reviewed at length with the patient today.  Concerns regarding medicines are outlined above.   Tests Ordered: Orders Placed This Encounter  Procedures  . NM Myocar Multi W/Spect W/Wall Motion / EF    Medication Changes: No orders of the defined types were placed in this encounter.   Follow Up: IN PERSON IN 2-3 MONTHS WITH DR. BRANCH  Signed, Ellsworth Lennox, [place]  05/06/2019 5:23 PM    [facility]

## 2019-05-12 ENCOUNTER — Encounter (HOSPITAL_COMMUNITY): Payer: 59

## 2019-05-13 ENCOUNTER — Encounter (HOSPITAL_COMMUNITY): Payer: Self-pay

## 2019-05-13 ENCOUNTER — Other Ambulatory Visit: Payer: Self-pay

## 2019-05-13 ENCOUNTER — Encounter (HOSPITAL_COMMUNITY)
Admission: RE | Admit: 2019-05-13 | Discharge: 2019-05-13 | Disposition: A | Discharge status: Home / Self Care | Payer: 59 | Source: Ambulatory Visit | Attending: Student | Admitting: Student

## 2019-05-13 ENCOUNTER — Encounter (HOSPITAL_BASED_OUTPATIENT_CLINIC_OR_DEPARTMENT_OTHER)
Admission: RE | Admit: 2019-05-13 | Discharge: 2019-05-13 | Disposition: A | Discharge status: Home / Self Care | Payer: 59 | Source: Ambulatory Visit | Attending: Student | Admitting: Student

## 2019-05-13 DIAGNOSIS — I25118 Atherosclerotic heart disease of native coronary artery with other forms of angina pectoris: Secondary | ICD-10-CM | POA: Diagnosis not present

## 2019-05-13 DIAGNOSIS — R0600 Dyspnea, unspecified: Secondary | ICD-10-CM | POA: Insufficient documentation

## 2019-05-13 DIAGNOSIS — R0609 Other forms of dyspnea: Secondary | ICD-10-CM

## 2019-05-13 HISTORY — DX: Essential (primary) hypertension: I10

## 2019-05-13 HISTORY — DX: Heart failure, unspecified: I50.9

## 2019-05-13 LAB — NM MYOCAR MULTI W/SPECT W/WALL MOTION / EF
LV dias vol: 162 mL (ref 62–150)
LV sys vol: 79 mL
Peak HR: 85 {beats}/min
RATE: 0.44
Rest HR: 56 {beats}/min
SDS: 6
SRS: 13
SSS: 17
TID: 1.12

## 2019-05-13 MED ORDER — TECHNETIUM TC 99M TETROFOSMIN IV KIT
10.0000 | PACK | Freq: Once | INTRAVENOUS | Status: AC | PRN
  Administered 2019-05-13: 10.9 via INTRAVENOUS

## 2019-05-13 MED ORDER — REGADENOSON 0.4 MG/5ML IV SOLN
INTRAVENOUS | Status: AC
  Administered 2019-05-13: 0.4 mg via INTRAVENOUS
  Filled 2019-05-13: qty 5

## 2019-05-13 MED ORDER — TECHNETIUM TC 99M TETROFOSMIN IV KIT
30.0000 | PACK | Freq: Once | INTRAVENOUS | Status: AC | PRN
  Administered 2019-05-13: 30.5 via INTRAVENOUS

## 2019-05-13 MED ORDER — SODIUM CHLORIDE FLUSH 0.9 % IV SOLN
INTRAVENOUS | Status: AC
  Administered 2019-05-13: 10 mL via INTRAVENOUS
  Filled 2019-05-13: qty 10

## 2019-05-17 ENCOUNTER — Telehealth: Payer: Self-pay | Admitting: Student

## 2019-05-17 DIAGNOSIS — I10 Essential (primary) hypertension: Secondary | ICD-10-CM

## 2019-05-17 DIAGNOSIS — I2510 Atherosclerotic heart disease of native coronary artery without angina pectoris: Secondary | ICD-10-CM

## 2019-05-17 NOTE — Telephone Encounter (Signed)
    Patient's stress test was high-risk. Reviewed with Dr. Harl Bowie who recommended a cardiac catheterization for definitive evaluation.   Called and spoke to the patient's wife in regards to the results. The patient himself has short-term memory loss secondary to a prior anoxic brain injury and she makes his medical decisions. They are in agreement to proceed with catheterization. The patient's wife understands that risks include but are not limited to stroke (1 in 68), death (1 in 27), kidney failure [usually temporary] (1 in 500), bleeding (1 in 200), allergic reaction [possibly serious] (1 in 200).    Catheterization scheduled on 05/27/2019 with Dr. Burt Knack at 0900 (needs to arrive at 0700). Patient's wife specifically requested Dr. Burt Knack given he performed the patient's last cath and is aware of the patient's anoxic brain injury and short-term memory loss. She asked if he would be a same day discharge as previous and I informed her this would be determined post-cath. She asked about spending the night with the patient and I informed her given COVID-19, I was unsure if she would be able to spend the night. She did express concern as he would need a full time sitter post-cath if spending the night.   Will forward this note to nursing staff to arrange for pre-procedure labs and COVID testing. The patient's wife wishes to be called at [phone] with this information. Will plan to pick up procedure instruction letter the day of labs.    Signed, Erma Heritage, PA-C 05/17/2019, 10:56 AM Pager: [phone]

## 2019-05-18 ENCOUNTER — Encounter: Payer: Self-pay | Admitting: *Deleted

## 2019-05-18 NOTE — Telephone Encounter (Signed)
Orders placed wife notified.

## 2019-05-24 ENCOUNTER — Other Ambulatory Visit (HOSPITAL_COMMUNITY)
Admission: RE | Admit: 2019-05-24 | Discharge: 2019-05-24 | Disposition: A | Discharge status: Home / Self Care | Payer: 59 | Source: Ambulatory Visit | Attending: Cardiovascular Disease | Admitting: Cardiovascular Disease

## 2019-05-24 ENCOUNTER — Other Ambulatory Visit: Payer: Self-pay

## 2019-05-24 ENCOUNTER — Other Ambulatory Visit (HOSPITAL_COMMUNITY)
Admission: RE | Admit: 2019-05-24 | Discharge: 2019-05-24 | Disposition: A | Discharge status: Home / Self Care | Payer: 59 | Source: Ambulatory Visit | Attending: Student | Admitting: Student

## 2019-05-24 ENCOUNTER — Telehealth: Payer: Self-pay | Admitting: *Deleted

## 2019-05-24 DIAGNOSIS — Z01812 Encounter for preprocedural laboratory examination: Secondary | ICD-10-CM | POA: Insufficient documentation

## 2019-05-24 DIAGNOSIS — Z20828 Contact with and (suspected) exposure to other viral communicable diseases: Secondary | ICD-10-CM | POA: Diagnosis not present

## 2019-05-24 LAB — CBC
HCT: 42.2 % (ref 39.0–52.0)
Hemoglobin: 13.8 g/dL (ref 13.0–17.0)
MCH: 28.6 pg (ref 26.0–34.0)
MCHC: 32.7 g/dL (ref 30.0–36.0)
MCV: 87.4 fL (ref 80.0–100.0)
Platelets: 260 10*3/uL (ref 150–400)
RBC: 4.83 MIL/uL (ref 4.22–5.81)
RDW: 12.3 % (ref 11.5–15.5)
WBC: 4.8 10*3/uL (ref 4.0–10.5)
nRBC: 0 % (ref 0.0–0.2)

## 2019-05-24 LAB — BASIC METABOLIC PANEL
Anion gap: 8 (ref 5–15)
BUN: 14 mg/dL (ref 6–20)
CO2: 25 mmol/L (ref 22–32)
Calcium: 9.1 mg/dL (ref 8.9–10.3)
Chloride: 106 mmol/L (ref 98–111)
Creatinine, Ser: 1.05 mg/dL (ref 0.61–1.24)
GFR calc Af Amer: >60 mL/min (ref >60)
GFR calc non Af Amer: >60 mL/min (ref >60)
Glucose, Bld: 114 mg/dL — ABNORMAL HIGH (ref 70–99)
Potassium: 4.1 mmol/L (ref 3.5–5.1)
Sodium: 139 mmol/L (ref 135–145)

## 2019-05-24 LAB — SARS CORONAVIRUS 2 (TAT 6-24 HRS): SARS Coronavirus 2: NEGATIVE

## 2019-05-24 NOTE — Telephone Encounter (Signed)
Pt contacted pre-catheterization scheduled at [facility] for: Friday May 27, 2019 9 AM Verified arrival time and place: [facility]) at: 7 AM   No solid food after midnight prior to cath, clear liquids until 5 AM day of procedure. Contrast allergy: no  Hold: Mobic-per instructions 05/18/19-hold Mobic 2 days prior to procedure.  Except hold medications AM meds can be  taken pre-cath with sip of water including: ASA 81 mg Plavix 75 mg   Confirmed patient has responsible adult to drive home post procedure and observe 24 hours after arriving home: yes  Currently, due to Covid-19 pandemic, only one support person will be allowed with patient. Must be the same support person for that patient's entire stay, will be screened and required to wear a mask. They will be asked to wait in the waiting room for the duration of the patient's stay.  Patients are required to wear a mask when they enter the hospital.      COVID-19 Pre-Screening Questions:  . In the past 7 to 10 days have you had a cough,  shortness of breath, headache, congestion, fever (100 or greater) body aches, chills, sore throat, or sudden loss of taste or sense of smell? no . Have you been around anyone with known Covid 19? no . Have you been around anyone who is awaiting Covid 19 test results in the past 7 to 10 days? no . Have you been around anyone who has been exposed to Covid 19, or has mentioned symptoms of Covid 19 within the past 7 to 10 days? no  I reviewed procedure/mask/visitor instructions, Covid-19 screening questions with patient's wife (DPR), Delores, she verbalized understanding.   Pt's wife states pt has short-term memory loss secondary to a prior anoxic brain injury and she makes his medical decisions.  Pt's wife concerned about patient being able to understand and follow instructions. Pt's wife is concerned about patient understanding and answering questions and  providing correct information. Pt's wife states if patient does have to stay overnight at the hospital following procedure, she requests that she be allowed to stay with patient or a sitter is arranged to stay with pt overnight.  Pt's wife advised I will forward to [facility] in cath lab and Dr Burt Knack for their review.

## 2019-05-24 NOTE — Telephone Encounter (Signed)
Shouldn't be a problem. Patients with cognitive issues can have a family member here with them.

## 2019-05-27 ENCOUNTER — Other Ambulatory Visit: Payer: Self-pay

## 2019-05-27 ENCOUNTER — Encounter (HOSPITAL_COMMUNITY)
Admission: RE | Disposition: A | Discharge status: Home / Self Care | Payer: Self-pay | Source: Home / Self Care | Attending: Cardiovascular Disease

## 2019-05-27 ENCOUNTER — Ambulatory Visit (HOSPITAL_COMMUNITY)
Admission: RE | Admit: 2019-05-27 | Discharge: 2019-05-27 | Disposition: A | Discharge status: Home / Self Care | Payer: 59 | Attending: Cardiovascular Disease | Admitting: Cardiovascular Disease

## 2019-05-27 DIAGNOSIS — I10 Essential (primary) hypertension: Secondary | ICD-10-CM | POA: Diagnosis present

## 2019-05-27 DIAGNOSIS — E785 Hyperlipidemia, unspecified: Secondary | ICD-10-CM | POA: Diagnosis present

## 2019-05-27 DIAGNOSIS — I25119 Atherosclerotic heart disease of native coronary artery with unspecified angina pectoris: Secondary | ICD-10-CM

## 2019-05-27 DIAGNOSIS — I2584 Coronary atherosclerosis due to calcified coronary lesion: Secondary | ICD-10-CM | POA: Insufficient documentation

## 2019-05-27 DIAGNOSIS — I2510 Atherosclerotic heart disease of native coronary artery without angina pectoris: Secondary | ICD-10-CM | POA: Diagnosis not present

## 2019-05-27 DIAGNOSIS — Z79899 Other long term (current) drug therapy: Secondary | ICD-10-CM | POA: Diagnosis not present

## 2019-05-27 DIAGNOSIS — K219 Gastro-esophageal reflux disease without esophagitis: Secondary | ICD-10-CM | POA: Diagnosis not present

## 2019-05-27 DIAGNOSIS — R413 Other amnesia: Secondary | ICD-10-CM | POA: Insufficient documentation

## 2019-05-27 DIAGNOSIS — G40909 Epilepsy, unspecified, not intractable, without status epilepticus: Secondary | ICD-10-CM | POA: Diagnosis not present

## 2019-05-27 DIAGNOSIS — Z7902 Long term (current) use of antithrombotics/antiplatelets: Secondary | ICD-10-CM | POA: Diagnosis not present

## 2019-05-27 DIAGNOSIS — Z87891 Personal history of nicotine dependence: Secondary | ICD-10-CM | POA: Insufficient documentation

## 2019-05-27 DIAGNOSIS — I119 Hypertensive heart disease without heart failure: Secondary | ICD-10-CM | POA: Insufficient documentation

## 2019-05-27 DIAGNOSIS — Z8674 Personal history of sudden cardiac arrest: Secondary | ICD-10-CM | POA: Diagnosis not present

## 2019-05-27 DIAGNOSIS — I255 Ischemic cardiomyopathy: Secondary | ICD-10-CM | POA: Insufficient documentation

## 2019-05-27 DIAGNOSIS — G931 Anoxic brain damage, not elsewhere classified: Secondary | ICD-10-CM | POA: Diagnosis not present

## 2019-05-27 DIAGNOSIS — Z8249 Family history of ischemic heart disease and other diseases of the circulatory system: Secondary | ICD-10-CM | POA: Diagnosis not present

## 2019-05-27 DIAGNOSIS — E782 Mixed hyperlipidemia: Secondary | ICD-10-CM | POA: Insufficient documentation

## 2019-05-27 DIAGNOSIS — Z955 Presence of coronary angioplasty implant and graft: Secondary | ICD-10-CM | POA: Insufficient documentation

## 2019-05-27 DIAGNOSIS — Z7982 Long term (current) use of aspirin: Secondary | ICD-10-CM | POA: Diagnosis not present

## 2019-05-27 DIAGNOSIS — I209 Angina pectoris, unspecified: Secondary | ICD-10-CM | POA: Diagnosis present

## 2019-05-27 DIAGNOSIS — I200 Unstable angina: Secondary | ICD-10-CM | POA: Diagnosis present

## 2019-05-27 DIAGNOSIS — R0609 Other forms of dyspnea: Secondary | ICD-10-CM | POA: Diagnosis not present

## 2019-05-27 DIAGNOSIS — R931 Abnormal findings on diagnostic imaging of heart and coronary circulation: Secondary | ICD-10-CM | POA: Insufficient documentation

## 2019-05-27 HISTORY — PX: CORONARY STENT INTERVENTION: CATH118234

## 2019-05-27 HISTORY — PX: LEFT HEART CATH AND CORONARY ANGIOGRAPHY: CATH118249

## 2019-05-27 LAB — POCT ACTIVATED CLOTTING TIME
Activated Clotting Time: 246 seconds
Activated Clotting Time: 268 seconds
Activated Clotting Time: 285 seconds

## 2019-05-27 SURGERY — LEFT HEART CATH AND CORONARY ANGIOGRAPHY
Anesthesia: LOCAL

## 2019-05-27 MED ORDER — MIDAZOLAM HCL 2 MG/2ML IJ SOLN
INTRAMUSCULAR | Status: AC
  Filled 2019-05-27: qty 2

## 2019-05-27 MED ORDER — MIDAZOLAM HCL 2 MG/2ML IJ SOLN
INTRAMUSCULAR | Status: DC | PRN
  Administered 2019-05-27 (×3): 1 mg via INTRAVENOUS

## 2019-05-27 MED ORDER — SODIUM CHLORIDE 0.9 % WEIGHT BASED INFUSION
1.0000 mL/kg/h | INTRAVENOUS | Status: DC
  Administered 2019-05-27: 09:00:00 999 mL/h via INTRAVENOUS

## 2019-05-27 MED ORDER — SODIUM CHLORIDE 0.9 % WEIGHT BASED INFUSION: 1.0000 mL/kg/h | INTRAVENOUS | Status: DC

## 2019-05-27 MED ORDER — HEPARIN SODIUM (PORCINE) 1000 UNIT/ML IJ SOLN
INTRAMUSCULAR | Status: AC
  Filled 2019-05-27: qty 1

## 2019-05-27 MED ORDER — SODIUM CHLORIDE 0.9% FLUSH: 3.0000 mL | INTRAVENOUS | Status: DC | PRN

## 2019-05-27 MED ORDER — VERAPAMIL HCL 2.5 MG/ML IV SOLN
INTRAVENOUS | Status: DC | PRN
  Administered 2019-05-27: 10 mL via INTRA_ARTERIAL

## 2019-05-27 MED ORDER — NITROGLYCERIN 1 MG/10 ML FOR IR/CATH LAB
INTRA_ARTERIAL | Status: AC
  Filled 2019-05-27: qty 10

## 2019-05-27 MED ORDER — HEPARIN SODIUM (PORCINE) 1000 UNIT/ML IJ SOLN
INTRAMUSCULAR | Status: DC | PRN
  Administered 2019-05-27: 4000 [IU] via INTRAVENOUS
  Administered 2019-05-27: 5000 [IU] via INTRAVENOUS
  Administered 2019-05-27: 6000 [IU] via INTRAVENOUS
  Administered 2019-05-27: 2000 [IU] via INTRAVENOUS

## 2019-05-27 MED ORDER — SODIUM CHLORIDE 0.9% FLUSH: 3.0000 mL | Freq: Two times a day (BID) | INTRAVENOUS | Status: DC

## 2019-05-27 MED ORDER — NITROGLYCERIN 1 MG/10 ML FOR IR/CATH LAB
INTRA_ARTERIAL | Status: DC | PRN
  Administered 2019-05-27: 100 ug via INTRACORONARY

## 2019-05-27 MED ORDER — VERAPAMIL HCL 2.5 MG/ML IV SOLN
INTRAVENOUS | Status: AC
  Filled 2019-05-27: qty 2

## 2019-05-27 MED ORDER — LABETALOL HCL 5 MG/ML IV SOLN: 10.0000 mg | INTRAVENOUS | Status: DC | PRN

## 2019-05-27 MED ORDER — HEPARIN (PORCINE) IN NACL 1000-0.9 UT/500ML-% IV SOLN
INTRAVENOUS | Status: AC
  Filled 2019-05-27: qty 1000

## 2019-05-27 MED ORDER — HYDRALAZINE HCL 20 MG/ML IJ SOLN: 10.0000 mg | INTRAMUSCULAR | Status: DC | PRN

## 2019-05-27 MED ORDER — ONDANSETRON HCL 4 MG/2ML IJ SOLN: 4.0000 mg | Freq: Four times a day (QID) | INTRAMUSCULAR | Status: DC | PRN

## 2019-05-27 MED ORDER — FENTANYL CITRATE (PF) 100 MCG/2ML IJ SOLN
INTRAMUSCULAR | Status: AC
  Filled 2019-05-27: qty 2

## 2019-05-27 MED ORDER — SODIUM CHLORIDE 0.9 % IV SOLN: 250.0000 mL | INTRAVENOUS | Status: DC | PRN

## 2019-05-27 MED ORDER — LIDOCAINE HCL (PF) 1 % IJ SOLN
INTRAMUSCULAR | Status: AC
  Filled 2019-05-27: qty 30

## 2019-05-27 MED ORDER — SODIUM CHLORIDE 0.9 % WEIGHT BASED INFUSION
3.0000 mL/kg/h | INTRAVENOUS | Status: DC
  Administered 2019-05-27: 3 mL/kg/h via INTRAVENOUS

## 2019-05-27 MED ORDER — LIDOCAINE HCL (PF) 1 % IJ SOLN
INTRAMUSCULAR | Status: DC | PRN
  Administered 2019-05-27: 2 mL

## 2019-05-27 MED ORDER — HEPARIN (PORCINE) IN NACL 1000-0.9 UT/500ML-% IV SOLN
INTRAVENOUS | Status: DC | PRN
  Administered 2019-05-27 (×2): 500 mL

## 2019-05-27 MED ORDER — ASPIRIN 81 MG PO CHEW: 81.0000 mg | CHEWABLE_TABLET | ORAL | Status: DC

## 2019-05-27 MED ORDER — IOHEXOL 350 MG/ML SOLN
INTRAVENOUS | Status: DC | PRN
  Administered 2019-05-27: 150 mL

## 2019-05-27 MED ORDER — FENTANYL CITRATE (PF) 100 MCG/2ML IJ SOLN
INTRAMUSCULAR | Status: DC | PRN
  Administered 2019-05-27 (×3): 25 ug via INTRAVENOUS

## 2019-05-27 MED ORDER — ACETAMINOPHEN 325 MG PO TABS: 650.0000 mg | ORAL_TABLET | ORAL | Status: DC | PRN

## 2019-05-27 SURGICAL SUPPLY — 24 items
BALLN SAPPHIRE 2.0X12 (BALLOONS) ×2
BALLN SAPPHIRE 2.5X12 (BALLOONS) ×2
BALLN SAPPHIRE ~~LOC~~ 2.75X10 (BALLOONS) ×2 IMPLANT
BALLN SAPPHIRE ~~LOC~~ 3.0X15 (BALLOONS) ×2 IMPLANT
BALLN ~~LOC~~ EMERGE MR 2.75X20 (BALLOONS) ×2
BALLOON SAPPHIRE 2.0X12 (BALLOONS) ×1 IMPLANT
BALLOON SAPPHIRE 2.5X12 (BALLOONS) ×1 IMPLANT
BALLOON ~~LOC~~ EMERGE MR 2.75X20 (BALLOONS) ×1 IMPLANT
CATH 5FR JL3.5 JR4 ANG PIG MP (CATHETERS) ×2 IMPLANT
CATH LAUNCHER 6FR EBU 3 (CATHETERS) ×2 IMPLANT
CATH LAUNCHER 6FR EBU3.5 (CATHETERS) ×2 IMPLANT
CATH TELESCOPE 6F GEC (CATHETERS) ×4 IMPLANT
DEVICE RAD COMP TR BAND LRG (VASCULAR PRODUCTS) ×2 IMPLANT
GLIDESHEATH SLEND SS 6F .021 (SHEATH) ×2 IMPLANT
GUIDEWIRE INQWIRE 1.5J.035X260 (WIRE) ×1 IMPLANT
INQWIRE 1.5J .035X260CM (WIRE) ×2
KIT ENCORE 26 ADVANTAGE (KITS) ×2 IMPLANT
KIT HEART LEFT (KITS) ×2 IMPLANT
PACK CARDIAC CATHETERIZATION (CUSTOM PROCEDURE TRAY) ×2 IMPLANT
STENT ORSIRO 2.75X26 (Permanent Stent) ×2 IMPLANT
TRANSDUCER W/STOPCOCK (MISCELLANEOUS) ×2 IMPLANT
TUBING CIL FLEX 10 FLL-RA (TUBING) ×2 IMPLANT
WIRE ASAHI GRAND SLAM 180CM (WIRE) ×2 IMPLANT
WIRE COUGAR XT STRL 190CM (WIRE) ×2 IMPLANT

## 2019-05-27 NOTE — Progress Notes (Signed)
CARDIAC REHAB PHASE I   1125 - 1150  Pt and wife educated on stent card, exercise, diet (HH), restrictions, infection prevention, NTG usage, Plavix and ASA, and was referred to [facility] in [place]. Pt's wife reports that pt will not exercise and has refused to following previous stents. Encouraged pt and wife to make healthy life style changes.   Philis Kendall, MS, ACSM CEP 05/27/2019 11:48 AM

## 2019-05-27 NOTE — Discharge Instructions (Signed)
DRINK PLENTY OF FLUIDS FOR THE NEXT 2-3 DAYS. ° °KEEP AFFECTED ARM ELEVATED FOR THE REMAINDER OF THE DAY. ° °Radial Site Care ° °This sheet gives you information about how to care for yourself after your procedure. Your health care provider may also give you more specific instructions. If you have problems or questions, contact your health care provider. °What can I expect after the procedure? °After the procedure, it is common to have: °· Bruising and tenderness at the catheter insertion area. °Follow these instructions at home: °Medicines °· Take over-the-counter and prescription medicines only as told by your health care provider. °Insertion site care °· Follow instructions from your health care provider about how to take care of your insertion site. Make sure you: °? Wash your hands with soap and water before you change your bandage (dressing). If soap and water are not available, use hand sanitizer. °? Change your dressing as told by your health care provider. °· Check your insertion site every day for signs of infection. Check for: °? Redness, swelling, or pain. °? Fluid or blood. °? Pus or a bad smell. °? Warmth. °· Do not take baths, swim, or use a hot tub for 5 days. °· You may shower 24-48 hours after the procedure. °? Remove the dressing and gently wash the site with plain soap and water. °? Pat the area dry with a clean towel. °? Do not rub the site. That could cause bleeding. °· Do not apply powder or lotion to the site. °Activity ° °· For 24 hours after the procedure, or as directed by your health care provider: °? Do not flex or bend the affected arm. °? Do not push or pull heavy objects with the affected arm. °? Do not drive yourself home from the hospital or clinic. You may drive 24 hours after the procedure unless your health care provider tells you not to. °? Do not operate machinery or power tools. °· Do not lift anything that is heavier than 10 lb for 5 days. °· Ask your health care provider  when it is okay to: °? Return to work or school. °? Resume usual physical activities or sports. °? Resume sexual activity. °General instructions °· If the catheter site starts to bleed, raise your arm and put firm pressure on the site. If the bleeding does not stop, get help right away. This is a medical emergency. °· If you went home on the same day as your procedure, a responsible adult should be with you for the first 24 hours after you arrive home. °· Keep all follow-up visits as told by your health care provider. This is important. °Contact a health care provider if: °· You have a fever. °· You have redness, swelling, or yellow drainage around your insertion site. °Get help right away if: °· You have unusual pain at the radial site. °· The catheter insertion area swells very fast. °· The insertion area is bleeding, and the bleeding does not stop when you hold steady pressure on the area. °· Your arm or hand becomes pale, cool, tingly, or numb. °These symptoms may represent a serious problem that is an emergency. Do not wait to see if the symptoms will go away. Get medical help right away. Call your local emergency services (911 in the [place] Do not drive yourself to the hospital. °Summary °· After the procedure, it is common to have bruising and tenderness at the site. °· Follow instructions from your health care provider about how to   take care of your radial site wound. Check the wound every day for signs of infection. °· Do not lift anything that is heavier than 10 lb for 5 days. ° °This information is not intended to replace advice given to you by your health care provider. Make sure you discuss any questions you have with your health care provider. °Document Released: 07/19/2010 Document Revised: 07/22/2017 Document Reviewed: 07/22/2017 °Elsevier Patient Education © 2020 [employer] ° °

## 2019-05-27 NOTE — Interval H&P Note (Signed)
Cath Lab Visit (complete for each Cath Lab visit)  Clinical Evaluation Leading to the Procedure:   ACS: No.  Non-ACS:    Anginal Classification: CCS II  Anti-ischemic medical therapy: Minimal Therapy (1 class of medications)  Non-Invasive Test Results: High-risk stress test findings: cardiac mortality >3%/year  Prior CABG: No previous CABG      History and Physical Interval Note:  05/27/2019 8:13 AM  [name] Cummings  has presented today for surgery, with the diagnosis of aortic stenosis.  The various methods of treatment have been discussed with the patient and family. After consideration of risks, benefits and other options for treatment, the patient has consented to  Procedure(s): LEFT HEART CATH AND CORONARY ANGIOGRAPHY (N/A) as a surgical intervention.  The patient's history has been reviewed, patient examined, no change in status, stable for surgery.  I have reviewed the patient's chart and labs.  Questions were answered to the patient's satisfaction.     [name]

## 2019-05-27 NOTE — Discharge Summary (Signed)
Discharge Summary    Patient ID: [name] Cummings MRN: [id number]; DOB: [birth year]  Admit date: 05/27/2019 Discharge date: 05/27/2019  Primary Care Provider: Christain Sacramento, MD  Primary Cardiologist: Carlyle Dolly, MD  Primary Electrophysiologist:  None   Discharge Diagnoses    Principal Problem:   CAD, NATIVE VESSEL Active Problems:   Hyperlipidemia LDL goal <70   ANOXIC BRAIN DAMAGE   Essential hypertension   Angina pectoris [facility])    Diagnostic Studies/Procedures    Left heart cath 05/27/19: 1.  Severe calcific proximal LAD stenosis and moderate stenosis at the edge of the previously implanted mid LAD stent 2.  Continued patency of the stented segment in the proximal circumflex 3.  Chronic subtotal occlusion of the first OM branch of the circumflex, unchanged from previous studies 4.  Chronic occlusion of the diagonal branch of the LAD collateralized from the apical LAD, also unchanged from previous studies 5.  Normal LVEDP 6. Successful PCI of the proximal LAD using a 2.75x26 mm Orsiro DES  Recommendations: Continue long-term dual antiplatelet therapy with aspirin and clopidogrel, aggressive medical therapy, candidate for same-day PCI discharge as long as no early complications arise.  Diagnostic Dominance: Left  Intervention       _____________   History of Present Illness     [name] Cummings is a 58 y.o. 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   He most recently had a telehealth visit with Dr. Harl Bowie in 10/2018 and denied any recurrent chest pain or dyspnea since his last office visit. A stress test had  been ordered at that time but not pursued due to COVID-19. Given no recurrent symptoms, was recommended to hold off on stress testing at that time. Was continued on his current medication regimen including ASA, Plavix, Coreg 25mg  BID, Pravastatin 20mg  daily and PRN Lasix.   Patient was seen in clinic on 05/06/19 with Bernerd Pho PAC. He and his wife reported worsening dyspnea on exertion over the past 2 to 3 months and increaesd fatigued. He denied any specific chest pain.  No recent orthopnea, PND or lower extremity edema. Wife is concerned about his dyspnea as this resembles his prior anginal symptoms at the time of recent stent placement in 2017 and 2018. Stress test was ordered and was abnormal. OP heart catheterization was scheduled.   Hospital Course     Consultants:   Abnormal stress test CAD with multiple PCIs Pt presented for scheduled heart catheterization. Angiography revealed severe calcification in the proximal LAD with moderate stenosis at the edge of previously placed LAD stent. Stent was patent. Chronic subtotal occlusion of the first OM unchanged from prior study. He also has chorni cocclusion of the diagonal branch of the LAD with collaterals from apical LAD, also unchanged from prior study. Successful DES in the proximal LAD. Pt tolerated the procedure well and recovered in short stay. Right radial cath site C/D/I without hematoma. He will continue his present medications of ASA, plavix, coreg, and statin.   Hypertension Pressure well-controlled. Medications as above.   Hyperlipidemia Followed by PCP. Intolerant to [employer]. Resume pravastatin. Will need follow  up lipids. LDL goal less than 70. May need consideration for PCSK9i and increas pravastatin to 40 mg.  Pt stable for same-day PCI discharge.   Did the patient have an acute coronary syndrome (MI, NSTEMI, STEMI, etc) this admission?:  No                               Did the patient have a percutaneous  coronary intervention (stent / angioplasty)?:  Yes.     Cath/PCI Registry Performance & Quality Measures: 1. Aspirin prescribed? - Yes 2. ADP Receptor Inhibitor (Plavix/Clopidogrel, Brilinta/Ticagrelor or Effient/Prasugrel) prescribed (includes medically managed patients)? - Yes 3. High Intensity Statin (Lipitor 40-80mg  or Crestor 20-40mg ) prescribed? - No - pravachol, intolerant to others 4. For EF <40%, was ACEI/ARB prescribed? - Not Applicable (EF >/= 40%) 5. For EF <40%, Aldosterone Antagonist (Spironolactone or Eplerenone) prescribed? - Not Applicable (EF >/= 40%) 6. Cardiac Rehab Phase II ordered (Included Medically managed Patients)? - Yes   _____________  Discharge Vitals Blood pressure 122/64, pulse (!) 55, temperature (!) 97.3 F (36.3 C), temperature source Temporal, resp. rate 16, height 6\' 2"  (1.88 m), weight 111.1 kg, SpO2 97 %.  Filed Weights   05/27/19 0702  Weight: 111.1 kg    Labs & Radiologic Studies    CBC No results for input(s): WBC, NEUTROABS, HGB, HCT, MCV, PLT in the last 72 hours. Basic Metabolic Panel No results for input(s): NA, K, CL, CO2, GLUCOSE, BUN, CREATININE, CALCIUM, MG, PHOS in the last 72 hours. Liver Function Tests No results for input(s): AST, ALT, ALKPHOS, BILITOT, PROT, ALBUMIN in the last 72 hours. No results for input(s): LIPASE, AMYLASE in the last 72 hours. High Sensitivity Troponin:   No results for input(s): TROPONINIHS in the last 720 hours.  BNP Invalid input(s): POCBNP D-Dimer No results for input(s): DDIMER in the last 72 hours. Hemoglobin A1C No results for input(s): HGBA1C in the last 72 hours. Fasting Lipid Panel No results for input(s): CHOL, HDL, LDLCALC, TRIG, CHOLHDL, LDLDIRECT in the last 72 hours. Thyroid Function Tests No results for input(s): TSH, T4TOTAL, T3FREE, THYROIDAB in the last 72 hours.  Invalid input(s): FREET3 _____________  Nm Myocar Multi W/spect W/wall Motion / Ef  Result Date: 05/13/2019   There was no ST segment deviation noted during stress.  Findings consistent with prior anterolateral/apical myocardial infarction with high level of peri-infarct ischemia.  The left ventricular ejection fraction is normal (55-65%).  High risk study based on degree of current ischemia.    Disposition   Pt is being discharged home today in good condition.  Follow-up Plans & Appointments    Follow-up Information    Antoine PocheBranch, Jonathan F, MD Follow up in 1 week(s).   Specialty: Cardiology Why: office will call for appt Contact information: [address] [place] [address] [phone]          Discharge Instructions    Amb Referral to Cardiac Rehabilitation   Complete by: As directed    Diagnosis: Coronary Stents   After initial evaluation and assessments completed: Virtual Based Care may be provided alone or in conjunction with Phase 2 Cardiac Rehab based on patient barriers.: Yes   Diet - low sodium heart healthy   Complete by: As directed    Discharge instructions   Complete by: As directed    No driving for 2 days. No lifting over 5 lbs for 1 week. No sexual activity for 1 week.  Keep procedure site clean & dry. If you notice increased pain, swelling, bleeding or pus, call/return!  You may shower, but no soaking baths/hot tubs/pools for 1 week.   Increase activity slowly   Complete by: As directed       Discharge Medications   Allergies as of 05/27/2019      Reactions   Sulfamethoxazole-trimethoprim Rash   Sweet's syndrome    Crestor [rosuvastatin Calcium]    Myalgias   Hydromorphone Hcl Other (See Comments)   Left side went numb   Lorazepam Other (See Comments)   Hallucinations    Pseudoephedrine Other (See Comments)   Caused seizure   Terfenadine Other (See Comments)   Reaction not known   Atorvastatin Other (See Comments)   Pt c/o myalgias      Medication List    TAKE these medications   aspirin EC 81 MG tablet Take 81 mg by mouth daily.    busPIRone 5 MG tablet Commonly known as: BUSPAR Take 5 mg by mouth 3 (three) times daily.   carvedilol 25 MG tablet Commonly known as: COREG Take 1 tablet (25 mg total) 2 (two) times daily with a meal by mouth.   clopidogrel 75 MG tablet Commonly known as: PLAVIX Take 1 tablet (75 mg total) daily by mouth. Same day PCI   cyclobenzaprine 10 MG tablet Commonly known as: FLEXERIL Take 10 mg by mouth at bedtime.   Dexilant 60 MG capsule Generic drug: dexlansoprazole Take 60 mg by mouth daily.   FLUoxetine 20 MG capsule Commonly known as: PROZAC Take 20 mg by mouth at bedtime.   fluticasone 50 MCG/ACT nasal spray Commonly known as: FLONASE Place 1 spray into both nostrils daily as needed for allergies.   HYDROcodone-acetaminophen 5-325 MG tablet Commonly known as: NORCO/VICODIN Take 1 tablet by mouth 4 (four) times daily as needed for moderate pain.   loratadine 10 MG tablet Commonly known as: CLARITIN Take 10 mg daily as needed by mouth for allergies.   meloxicam 7.5 MG tablet Commonly known as: MOBIC Take 7.5 mg by mouth daily.   nitroGLYCERIN 0.4 MG SL tablet Commonly known as: Nitrostat Place 1 tablet (0.4 mg total) every 5 (five) minutes as needed under the tongue for chest pain.   oxybutynin 5 MG tablet Commonly known as: DITROPAN Take 5 mg by mouth 2 (two) times daily.   pantoprazole 40 MG tablet Commonly known as: PROTONIX Take 1 tablet (40 mg total) daily by mouth.   pravastatin 40 MG tablet Commonly known as: PRAVACHOL Take 40 mg by mouth at bedtime.   Ventolin HFA 108 (90 Base) MCG/ACT inhaler Generic drug: albuterol Inhale 1-2 puffs into the lungs every 6 (six) hours as needed for wheezing or shortness of breath.   vitamin B-12 1000 MCG tablet Commonly known as: CYANOCOBALAMIN Take 1,000 mcg by mouth daily.   Vitamin D (Ergocalciferol) 1.25 MG (50000 UT) Caps capsule Commonly known as: DRISDOL Take 50,000 Units by mouth every Tuesday.           Outstanding Labs/Studies   Lipids  Duration of Discharge Encounter   Greater than 30 minutes including physician time.  Signed, Roe Rutherford Duke, PA 05/27/2019, 2:48 PM

## 2019-05-30 ENCOUNTER — Encounter (HOSPITAL_COMMUNITY): Payer: Self-pay | Admitting: Cardiovascular Disease

## 2019-06-01 NOTE — Progress Notes (Deleted)
Cardiology Office Note    Date:  06/01/2019   ID:  [name] Cummings, DOB [birth year], MRN [id number]  PCP:  Christain Sacramento, MD  Cardiologist: Carlyle Dolly, MD EPS: None  No chief complaint on file.   History of Present Illness:  [name] Cummings is a 58 y.o. 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  He had severe calcification of the proximal LAD with moderate stenosis at the edge of previously placed LAD stent.  He underwent DES to proximal LAD.  He has chronic subtotal occlusion of the first OM unchanged and chronic occlusion of diagonal branch of the LAD with collaterals from apical LAD unchanged.    Past Medical History:  Diagnosis Date  . Anxiety   . Brain anoxic injury ([name])    a. 2007 in setting of VF arrest.  . CAD (coronary artery disease)    a. s/p BMS to LAD in 2007 with stent thrombosis and VF arrest and LAD re-stented b. DES to distal LAD in 2010 c. ISR and DES to LAD in 2017 d. DES to LCx in 04/2017 and severe ostial stenosis of small RCA  . CHF (congestive heart failure) ([name])   . Depression   . Dizziness   . Dysphagia, unspecified(787.20)   . GERD (gastroesophageal reflux disease)   . History of seizure disorder   . Hyperlipidemia, mixed   . Hypertension   . Hypertensive heart disease    unspecified  . Ischemic cardiomyopathy    a. 06/2014 Echo: EF 60-65%.  . 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  . Nephrolithiasis   . 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 2007->VF Arrest.    Past Surgical History:   Procedure Laterality Date  . bedside tracheostomy     with #6 [place]. 04/14/2006  . CARDIAC CATHETERIZATION  09/2005   with bare metal stent to the mid LAD (Minivision 2.5 x 18 mm) with Provisional balloon angioplasty to the second diagonal.  . CARDIAC CATHETERIZATION  03/2006   with stenting of the mid LAD with Taxus 2.5 x 20 mm and 2.5 x 8 mm stents.      Marland Kitchen CARDIAC CATHETERIZATION N/A 07/20/2015   Procedure: Left Heart Cath and Coronary Angiography;  Surgeon: [name], MD;  Location: [place] [facility];  Service: Cardiovascular;  Laterality: N/A;  . CARDIAC CATHETERIZATION N/A 07/20/2015   Procedure: Coronary Stent Intervention;  Surgeon: [name], MD;  Location: [place] [facility];  Service: Cardiovascular;  Laterality: N/A;  . CORONARY STENT INTERVENTION N/A 05/15/2017   Procedure: CORONARY STENT INTERVENTION;  Surgeon: [name], MD;  Location: [place] [facility];  Service: Cardiovascular;  Laterality: N/A;  . CORONARY STENT INTERVENTION N/A 05/27/2019   Procedure: CORONARY STENT INTERVENTION;  Surgeon: [name], MD;  Location: [place] [facility];  Service: Cardiovascular;  Laterality: N/A;  . CORONARY STENT PLACEMENT  07/20/2015   LAD with DES  . ESOPHAGOGASTRODUODENOSCOPY    . KIDNEY STONE SURGERY    .  LEFT HEART CATH AND CORONARY ANGIOGRAPHY N/A 05/15/2017   Procedure: LEFT HEART CATH AND CORONARY ANGIOGRAPHY;  Surgeon: [name], [name], MD;  [facility];  Service: Cardiovascular;  Laterality: N/A;  . LEFT HEART CATH AND CORONARY ANGIOGRAPHY N/A 05/27/2019   Procedure: LEFT HEART CATH AND CORONARY ANGIOGRAPHY;  Surgeon: [name], [name], MD;  [facility];  Service: Cardiovascular;  Laterality: N/A;  . PEG TUBE PLACEMENT    . PEG TUBE REMOVAL    . stent thrombosis  03/2006   Complicated my sudden cardiac death  . TRACHEOSTOMY CLOSURE      Current Medications: No outpatient medications have been marked as taking for  the 06/06/19 encounter (Appointment) with Dyann KiefLenze, Charis Juliana M, PA-C.     Allergies:   Sulfamethoxazole-trimethoprim, Crestor [rosuvastatin calcium], Hydromorphone hcl, Lorazepam, Pseudoephedrine, Terfenadine, and Atorvastatin   Social History   Socioeconomic History  . Marital status: Married    Spouse name: Not on file  . Number of children: 4  . Years of education: 5212  . Highest education level: Not on file  Occupational History  . Occupation: Disabled  Social Needs  . Financial resource strain: Not on file  . Food insecurity    Worry: Not on file    Inability: Not on file  . Transportation needs    Medical: Not on file    Non-medical: Not on file  Tobacco Use  . Smoking status: Never Smoker  . Smokeless tobacco: Former NeurosurgeonUser    Types: Chew  Substance and Sexual Activity  . Alcohol use: No    Alcohol/week: 0.0 standard drinks  . Drug use: No  . Sexual activity: Not on file  Lifestyle  . Physical activity    Days per week: Not on file    Minutes per session: Not on file  . Stress: Not on file  Relationships  . Social Musicianconnections    Talks on phone: Not on file    Gets together: Not on file    Attends religious service: Not on file    Active member of club or organization: Not on file    Attends meetings of clubs or organizations: Not on file    Relationship status: Not on file  Other Topics Concern  . Not on file  Social History Narrative   Lives at home with wife.   Right-handed.   One soda and tea daily.     Family History:  The patient's ***family history includes Cancer in his father; Diabetes in his mother and sister; Heart attack in his father and mother; Hydrocephalus in his mother.   ROS:   Please see the history of present illness.    ROS All other systems reviewed and are negative.   PHYSICAL EXAM:   VS:  There were no vitals taken for this visit.  Physical Exam  GEN: Well nourished, well developed, in no acute distress  HEENT: normal  Neck: no  JVD, carotid bruits, or masses Cardiac:RRR; no murmurs, rubs, or gallops  Respiratory:  clear to auscultation bilaterally, normal work of breathing GI: soft, nontender, nondistended, + BS Ext: without cyanosis, clubbing, or edema, Good distal pulses bilaterally MS: no deformity or atrophy  Skin: warm and dry, no rash Neuro:  Alert and Oriented x 3, Strength and sensation are intact Psych: euthymic mood, full affect  Wt Readings from Last 3 Encounters:  05/27/19 245 lb (111.1 kg)  05/06/19 256 lb (116.1 kg)  11/12/18 253 lb (114.8 kg)  Studies/Labs Reviewed:   EKG:  EKG is*** ordered today.  The ekg ordered today demonstrates ***  Recent Labs: 08/28/2018: ALT 106 05/24/2019: BUN 14; Creatinine, Ser 1.05; Hemoglobin 13.8; Platelets 260; Potassium 4.1; Sodium 139   Lipid Panel    Component Value Date/Time   CHOL 197 04/21/2017 1617   TRIG 359 (H) 04/21/2017 1617   HDL 41 04/21/2017 1617   CHOLHDL 4.8 04/21/2017 1617   VLDL 72 (H) 04/21/2017 1617   LDLCALC 84 04/21/2017 1617    Additional studies/ records that were reviewed today include:   NST 11/13/2020There was no ST segment deviation noted during stress.  Findings consistent with prior anterolateral/apical myocardial infarction with high level of peri-infarct ischemia.  The left ventricular ejection fraction is normal (55-65%).  High risk study based on degree of current ischemia.   Left heart cath 05/27/19: 1.  Severe calcific proximal LAD stenosis and moderate stenosis at the edge of the previously implanted mid LAD stent 2.  Continued patency of the stented segment in the proximal circumflex 3.  Chronic subtotal occlusion of the first OM branch of the circumflex, unchanged from previous studies 4.  Chronic occlusion of the diagonal branch of the LAD collateralized from the apical LAD, also unchanged from previous studies 5.  Normal LVEDP 6. Successful PCI of the proximal LAD using a 2.75x26 mm Orsiro DES    Recommendations: Continue long-term dual antiplatelet therapy with aspirin and clopidogrel, aggressive medical therapy, candidate for same-day PCI discharge as long as no early complications arise.   2D echo 2/2020IMPRESSIONS      1. The left ventricle has normal systolic function, with an ejection fraction of 55-60%. The cavity size was normal. There is mildly increased left ventricular wall thickness. Left ventricular diastolic parameters were normal.The apex is hypokinetic  2. The right ventricle has normal systolic function. The cavity was normal. There is no increase in right ventricular wall thickness.  3. Left atrial size was mildly dilated.  4. Small pericardial effusion.  5. The mitral valve is normal in structure. No evidence of mitral valve stenosis.  6. The tricuspid valve is normal in structure.  7. The aortic valve is tricuspid no stenosis of the aortic valve.  8. The aortic root is normal in size and structure.  9. Pulmonary hypertension is indeterminant, inadequate TR jet. 10. Right atrial pressure is estimated at 3 mmHg.      ASSESSMENT:    1. Atherosclerosis of native coronary artery of native heart without angina pectoris   2. Essential hypertension   3. 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  Concerns regarding medicines are outlined above.  Medication changes, Labs and Tests ordered today are  listed in the Patient Instructions below. There are no Patient Instructions on file for this visit.   Signed, Jacolyn Reedy, PA-C  06/01/2019 2:20 PM    [facility] [address], [place], [place]  [address] Phone: [phone]; Fax: [phone]

## 2019-06-06 ENCOUNTER — Ambulatory Visit: Payer: 59 | Admitting: Physician Assistant

## 2019-06-07 ENCOUNTER — Other Ambulatory Visit: Payer: Self-pay

## 2019-06-07 ENCOUNTER — Encounter: Payer: Self-pay | Admitting: *Deleted

## 2019-06-07 ENCOUNTER — Ambulatory Visit (INDEPENDENT_AMBULATORY_CARE_PROVIDER_SITE_OTHER): Payer: 59 | Admitting: Student

## 2019-06-07 ENCOUNTER — Encounter: Payer: Self-pay | Admitting: Student

## 2019-06-07 VITALS — BP 123/74 | HR 69 | Temp 97.1°F | Ht 74.0 in | Wt 257.0 lb

## 2019-06-07 DIAGNOSIS — G931 Anoxic brain damage, not elsewhere classified: Secondary | ICD-10-CM | POA: Diagnosis not present

## 2019-06-07 DIAGNOSIS — E785 Hyperlipidemia, unspecified: Secondary | ICD-10-CM

## 2019-06-07 DIAGNOSIS — I10 Essential (primary) hypertension: Secondary | ICD-10-CM | POA: Diagnosis not present

## 2019-06-07 DIAGNOSIS — I25118 Atherosclerotic heart disease of native coronary artery with other forms of angina pectoris: Secondary | ICD-10-CM

## 2019-06-07 NOTE — Patient Instructions (Signed)
Medication Instructions:  Your physician recommends that you continue on your current medications as directed. Please refer to the Current Medication list given to you today.  *If you need a refill on your cardiac medications before your next appointment, please call your pharmacy*  Lab Work: NONE   If you have labs (blood work) drawn today and your tests are completely normal, you will receive your results only by: Marland Kitchen MyChart Message (if you have MyChart) OR . A paper copy in the mail If you have any lab test that is abnormal or we need to change your treatment, we will call you to review the results.  Testing/Procedures: NONE   Follow-Up: At [facility], you and your health needs are our priority.  As part of our continuing mission to provide you with exceptional heart care, we have created designated Provider Care Teams.  These Care Teams include your primary Cardiologist (physician) and Advanced Practice Providers (APPs -  Physician Assistants and Nurse Practitioners) who all work together to provide you with the care you need, when you need it.  Your next appointment:    As Planned   The format for your next appointment:   Either In Person or Virtual  Provider:   Carlyle Dolly, MD  Other Instructions Thank you for choosing [facility]!
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
## 2019-06-09 ENCOUNTER — Telehealth: Payer: Self-pay | Admitting: Student

## 2019-06-09 DIAGNOSIS — E785 Hyperlipidemia, unspecified: Secondary | ICD-10-CM

## 2019-06-09 MED ORDER — PRAVASTATIN SODIUM 80 MG PO TABS: 80.0000 mg | ORAL_TABLET | Freq: Every evening | ORAL | 3 refills | Status: DC

## 2019-06-09 NOTE — Telephone Encounter (Signed)
     Please let the patient's wife know I reviewed his most recent labs from his PCP. His LDL was elevated to 118 and goal is less than 70 given his known CAD. He is currently on Pravastatin 40 mg daily and was previously intolerant to Crestor and Atorvastatin. I would recommend that we titrate his Pravastatin to 80 mg daily and recheck FLP and LFT's in 8 weeks.   Signed, Erma Heritage, PA-C 06/09/2019, 7:47 AM Pager: [phone]

## 2019-06-15 NOTE — Telephone Encounter (Signed)
Pt's wife notified.

## 2019-07-22 ENCOUNTER — Ambulatory Visit: Payer: 59 | Admitting: Cardiology

## 2019-07-27 ENCOUNTER — Ambulatory Visit: Payer: 59 | Admitting: Cardiology

## 2019-09-27 ENCOUNTER — Ambulatory Visit (INDEPENDENT_AMBULATORY_CARE_PROVIDER_SITE_OTHER): Payer: Medicare HMO | Admitting: Sports Medicine

## 2019-09-27 ENCOUNTER — Other Ambulatory Visit: Payer: Self-pay | Admitting: Sports Medicine

## 2019-09-27 ENCOUNTER — Ambulatory Visit (INDEPENDENT_AMBULATORY_CARE_PROVIDER_SITE_OTHER): Payer: No Typology Code available for payment source

## 2019-09-27 ENCOUNTER — Encounter: Payer: Self-pay | Admitting: Sports Medicine

## 2019-09-27 ENCOUNTER — Ambulatory Visit (INDEPENDENT_AMBULATORY_CARE_PROVIDER_SITE_OTHER): Payer: Medicare HMO

## 2019-09-27 ENCOUNTER — Other Ambulatory Visit: Payer: Self-pay

## 2019-09-27 VITALS — BP 115/65 | HR 53 | Temp 97.8°F | Resp 16

## 2019-09-27 DIAGNOSIS — M19079 Primary osteoarthritis, unspecified ankle and foot: Secondary | ICD-10-CM

## 2019-09-27 DIAGNOSIS — M19071 Primary osteoarthritis, right ankle and foot: Secondary | ICD-10-CM

## 2019-09-27 DIAGNOSIS — M25572 Pain in left ankle and joints of left foot: Secondary | ICD-10-CM

## 2019-09-27 DIAGNOSIS — M25571 Pain in right ankle and joints of right foot: Secondary | ICD-10-CM

## 2019-09-27 DIAGNOSIS — M79671 Pain in right foot: Secondary | ICD-10-CM

## 2019-09-27 DIAGNOSIS — M79672 Pain in left foot: Secondary | ICD-10-CM

## 2019-09-27 DIAGNOSIS — M729 Fibroblastic disorder, unspecified: Secondary | ICD-10-CM
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
## 2019-10-18 ENCOUNTER — Ambulatory Visit (INDEPENDENT_AMBULATORY_CARE_PROVIDER_SITE_OTHER): Payer: No Typology Code available for payment source | Admitting: Cardiology

## 2019-10-18 ENCOUNTER — Encounter: Payer: Self-pay | Admitting: Cardiology

## 2019-10-18 VITALS — BP 132/78 | HR 57 | Temp 97.8°F | Ht 73.5 in | Wt 253.0 lb

## 2019-10-18 DIAGNOSIS — I2510 Atherosclerotic heart disease of native coronary artery without angina pectoris: Secondary | ICD-10-CM

## 2019-10-18 DIAGNOSIS — E782 Mixed hyperlipidemia: Secondary | ICD-10-CM

## 2019-10-18 NOTE — Progress Notes (Signed)
Clinical Summary [name] Cummings is a 59 y.o.male seen today for follow up of the following medical problems  History is obtained primarily from interview of his wife. Due to patient's history of anoxic brain injury and severe short term memory deficit  1. CAD - hx of BMS to LAD in 2007, followed by stent thrombosis with resulting VF arrest. LAD was restented at that time. - cath 2010 LAD 50% prox, 30% ISR in mid LAD, distal LAD 70-80%, LCX 40% ostial, 70-80% narrowing proximal portion LCX in the ramus Zi Sek, RCA 70-80% proximal. LVEF 45%, the distal LAD was stented.  - cath Jan 2017 severe ISR of LAD stent, received another DES. Small OM1 subtotally occluded with left to left collaterals - echo Jan 2016 LVEF 60-65% - seen in [facility] 08/2015 with atypical chest pain, no evidence of ACS. Imdur was added.     04/2017 nuclear stress: anterolateral infarct with mild peri-infarct ischemia. Large lateral/inferolateral/inferior/inferioapical defect with moderate ischemia. LVEF 30-44% by nuclear.  - 04/2017 echo LVEF 50-55%, multiple WMAs  -04/2017 cath severe LCX ostial disease, received DES. Moderate ISR of LAD stent. Severe ostial disease of small nondominant RCA.   07/2018 echo LVEF 55-60%, apex hypokinetic - 05/2019 nuclear stress high degree of ischemia - 05/2019 cath as reported below, received DES to prox LAD  - no recent chest pains.  - compliant with meds  2. 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 Patient is not able to give description of symptoms, symptoms are all relayed through his wife.   3. Hyperlipidemia - did not tolerate crestor or lipitor - has been on pravastatin - recent labs with pcp    Past Medical History:  Diagnosis Date  . Anxiety   . 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  . CAD (coronary artery disease)    a. s/p BMS to LAD in 2007 with stent thrombosis and VF arrest and LAD  re-stented b. DES to distal LAD in 2010 c. ISR and DES to LAD in 2017 d. DES to LCx in 04/2017 and severe ostial stenosis of small RCA e. 05/2019: DES to proximal LAD.   Marland Kitchen CHF (congestive heart failure) (HCC)   . Depression   . Dizziness   . Dysphagia, unspecified(787.20)   . GERD (gastroesophageal reflux disease)   . History of seizure disorder   . Hyperlipidemia, mixed   . Hypertension   . Hypertensive heart disease    unspecified  . Ischemic cardiomyopathy    a. 06/2014 Echo: EF 60-65%.  . 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  . Nephrolithiasis   . 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     Allergies  Allergen Reactions  . Sulfamethoxazole-Trimethoprim Rash    Sweet's syndrome   . Crestor [Rosuvastatin Calcium]     Myalgias  . Hydromorphone Hcl Other (See Comments)    Left side went numb  . Lorazepam Other (See Comments)    Hallucinations   . Pseudoephedrine Other (See Comments)    Caused seizure  . Terfenadine Other (See Comments)    Reaction not known  . Atorvastatin Other (See Comments)    Pt c/o myalgias     Current Outpatient Medications  Medication Sig Dispense Refill  . aspirin EC 81 MG tablet Take 81 mg by mouth daily.    . busPIRone (BUSPAR) 5 MG tablet Take 5  mg by mouth 3 (three) times daily.     . carvedilol (COREG) 25 MG tablet Take 1 tablet (25 mg total) 2 (two) times daily with a meal by mouth. 180 tablet 3  . clopidogrel (PLAVIX) 75 MG tablet Take 1 tablet (75 mg total) daily by mouth. Same day PCI 90 tablet 3  . cyclobenzaprine (FLEXERIL) 10 MG tablet Take 10 mg by mouth at bedtime.    Marland Kitchen. DEXILANT 60 MG capsule Take 60 mg by mouth daily.    Marland Kitchen. FLUoxetine (PROZAC) 20 MG capsule Take 20 mg by mouth at bedtime.     . fluticasone (FLONASE) 50 MCG/ACT nasal spray Place 1 spray into both nostrils daily as needed for allergies.     Marland Kitchen. HYDROcodone-acetaminophen (NORCO/VICODIN) 5-325 MG tablet Take 1 tablet by mouth  4 (four) times daily as needed for moderate pain.     Marland Kitchen. loratadine (CLARITIN) 10 MG tablet Take 10 mg daily as needed by mouth for allergies.     . meloxicam (MOBIC) 7.5 MG tablet Take 7.5 mg by mouth daily.    . nitroGLYCERIN (NITROSTAT) 0.4 MG SL tablet Place 1 tablet (0.4 mg total) every 5 (five) minutes as needed under the tongue for chest pain. 25 tablet 3  . oxybutynin (DITROPAN) 5 MG tablet Take 5 mg by mouth 2 (two) times daily.    . pantoprazole (PROTONIX) 40 MG tablet Take 1 tablet (40 mg total) daily by mouth. 90 tablet 3  . pravastatin (PRAVACHOL) 80 MG tablet Take 1 tablet (80 mg total) by mouth every evening. 90 tablet 3  . VENTOLIN HFA 108 (90 Base) MCG/ACT inhaler Inhale 1-2 puffs into the lungs every 6 (six) hours as needed for wheezing or shortness of breath.     . vitamin B-12 (CYANOCOBALAMIN) 1000 MCG tablet Take 1,000 mcg by mouth daily.    . Vitamin D, Ergocalciferol, (DRISDOL) 1.25 MG (50000 UT) CAPS capsule Take 50,000 Units by mouth every Tuesday.     No current facility-administered medications for this visit.     Past Surgical History:  Procedure Laterality Date  . bedside tracheostomy     with #6 Shiley. 04/14/2006  . CARDIAC CATHETERIZATION  09/2005   with bare metal stent to the mid LAD (Minivision 2.5 x 18 mm) with Provisional balloon angioplasty to the second diagonal.  . CARDIAC CATHETERIZATION  03/2006   with stenting of the mid LAD with Taxus 2.5 x 20 mm and 2.5 x 8 mm stents.      Marland Kitchen. CARDIAC CATHETERIZATION N/A 07/20/2015   Procedure: Left Heart Cath and Coronary Angiography;  Surgeon: Corky CraftsJayadeep S Varanasi, MD;  [facility];  Service: Cardiovascular;  Laterality: N/A;  . CARDIAC CATHETERIZATION N/A 07/20/2015   Procedure: Coronary Stent Intervention;  Surgeon: Corky CraftsJayadeep S Varanasi, MD;  [facility];  Service: Cardiovascular;  Laterality: N/A;  . CORONARY STENT INTERVENTION N/A 05/15/2017   Procedure: CORONARY STENT  INTERVENTION;  Surgeon: [name], [name], MD;  [facility];  Service: Cardiovascular;  Laterality: N/A;  . CORONARY STENT INTERVENTION N/A 05/27/2019   Procedure: CORONARY STENT INTERVENTION;  Surgeon: [name], [name], MD;  [facility];  Service: Cardiovascular;  Laterality: N/A;  . CORONARY STENT PLACEMENT  07/20/2015   LAD with DES  . ESOPHAGOGASTRODUODENOSCOPY    . KIDNEY STONE SURGERY    . LEFT HEART CATH AND CORONARY ANGIOGRAPHY N/A 05/15/2017   Procedure: LEFT HEART CATH AND CORONARY ANGIOGRAPHY;  Surgeon: Excell Seltzerooper,  Legrand Como, MD;  Location: [place] CV LAB;  Service: Cardiovascular;  Laterality: N/A;  . LEFT HEART CATH AND CORONARY ANGIOGRAPHY N/A 05/27/2019   Procedure: LEFT HEART CATH AND CORONARY ANGIOGRAPHY;  Surgeon: [name], MD;  Location: [place] CV LAB;  Service: Cardiovascular;  Laterality: N/A;  . PEG TUBE PLACEMENT    . PEG TUBE REMOVAL    . stent thrombosis  [id number]/3419   Complicated my sudden cardiac death  . TRACHEOSTOMY CLOSURE       Allergies  Allergen Reactions  . Sulfamethoxazole-Trimethoprim Rash    Sweet's syndrome   . Crestor [Rosuvastatin Calcium]     Myalgias  . Hydromorphone Hcl Other (See Comments)    Left side went numb  . Lorazepam Other (See Comments)    Hallucinations   . Pseudoephedrine Other (See Comments)    Caused seizure  . Terfenadine Other (See Comments)    Reaction not known  . Atorvastatin Other (See Comments)    Pt c/o myalgias      Family History  Problem Relation Age of Onset  . Heart attack Mother   . Diabetes Mother   . Hydrocephalus Mother   . Heart attack Father   . Cancer Father        unsure of type  . Diabetes Sister   . Coronary artery disease Neg Hx        Siblings     Social History Mr. Mcqueary reports that he has never smoked. He quit smokeless tobacco use about 14 years ago.  His smokeless tobacco use included chew. Mr. Dobrowski reports no history of alcohol  use.   Review of Systems CONSTITUTIONAL: No weight loss, fever, chills, weakness or fatigue.  HEENT: Eyes: No visual loss, blurred vision, double vision or yellow sclerae.No hearing loss, sneezing, congestion, runny nose or sore throat.  SKIN: No rash or itching.  CARDIOVASCULAR: per hpi RESPIRATORY: No shortness of breath, cough or sputum.  GASTROINTESTINAL: No anorexia, nausea, vomiting or diarrhea. No abdominal pain or blood.  GENITOURINARY: No burning on urination, no polyuria NEUROLOGICAL: No headache, dizziness, syncope, paralysis, ataxia, numbness or tingling in the extremities. No change in bowel or bladder control.  MUSCULOSKELETAL: No muscle, back pain, joint pain or stiffness.  LYMPHATICS: No enlarged nodes. No history of splenectomy.  PSYCHIATRIC: No history of depression or anxiety.  ENDOCRINOLOGIC: No reports of sweating, cold or heat intolerance. No polyuria or polydipsia.  Marland Kitchen   Physical Examination Today's Vitals   10/18/19 0814  BP: 132/78  Pulse: (!) 57  Temp: 97.8 F (36.6 C)  SpO2: 95%  Weight: 253 lb (114.8 kg)  Height: 6' 1.5" (1.867 m)   Body mass index is 32.93 kg/m.  Gen: resting comfortably, no acute distress HEENT: no scleral icterus, pupils equal round and reactive, no palptable cervical adenopathy,  CV: RRR, no m/r/g, no jvd Resp: Clear to auscultation bilaterally GI: abdomen is soft, non-tender, non-distended, normal bowel sounds, no hepatosplenomegaly MSK: extremities are warm, no edema.  Skin: warm, no rash Neuro:  no focal deficits Psych: appropriate affect   Diagnostic Studies 11/2008 Cath RESULTS: Left main coronary artery: The left main coronary artery was free of significant disease.  Left anterior descending: The left anterior descending artery gave rise to small diagonal Xochil Shanker and small septal perforator, and larger diagonal Zigmond Trela. There was 30% narrowing within the midportion of the previously placed stents. Distal  to the stent, there was an eccentric lesion, which appeared to be a ruptured plaque, which was  estimated to be 70-80% obstructive. There also was 40-50% diffuse narrowing in the proximal LAD.  Circumflex artery: The circumflex artery gave rise to a large ramus Jaelen Soth and two posterolateral branches and a posterior descending Faline Langer. This was a dominant vessel. There was 70-80% narrowing in the proximal portion in the ramus Darcell Sabino which was extended over about 18 mm. There was also a 40% ostial narrowing in the circumflex artery and 50% narrowing in the ostium of the ramus Sebastyan Snodgrass.  Right coronary artery: The right coronary artery was a nondominant vessel which supplied only right ventricular branches. There was a 70- 80% proximal stenosis.  Left ventriculogram: The left ventriculogram was performed in the RAO projection showed hypokinesis of the anterolateral wall and apex. The estimated fraction was 45%.  Following stenting of the lesion in the mid LAD, the stenosis improved from 80% to 0%.  CONCLUSIONS: 1. Coronary artery disease, status post previous percutaneous coronary  interventions as described above with 40% narrowing in the proximal  LAD, 30% narrowing within the stent in the mid LAD, and 80%  narrowing in the mid LAD after the stent, 70-80% narrowing in the  large ramus Alphonza Tramell of the circumflex artery with 40% ostial  stenosis in the dominant circumflex artery, and 80% narrowing in  the proximal portion of a nondominant right coronary artery with  anterolateral wall and apical wall hypokinesis and estimated  ejection fraction of 45%. 2. Successful PCI of the lesion in the mid LAD distal to the  previously placed stent using a XIENCE drug-eluting stent with  improvement in center narrowing from 80% to 0%.  DISPOSITION: The patient returned to [facility] room for  further observation.    Jan 2016 echo Study Conclusions  - Left ventricle: The cavity size was normal. Wall thickness was normal. Systolic function was normal. The estimated ejection fraction was in the range of 60% to 65%. Indeterminant diastolic function. - Aortic valve: Mildly calcified annulus. Mildly thickened leaflets. - Mitral valve: Mildly calcified annulus. Mildly thickened leaflets . - Left atrium: The atrium was mildly dilated. - Systemic veins: IVC is small, suggestive of low RA pressure and hypovolemia. - Technically adequate study.   07/10/15 Clinic EKG (performed and reviewed in clinic): Sinus bradycardia  Jan 2017 Lexiscan MPI  Perfusion Summary Defect 1:  There is a large defect of moderate severity present in the basal anterolateral, mid anterolateral and apical lateral location. The defect is partially reversible. Large, moderate intensity, partially reversible anterolateral defect consistent with scar and at least moderate peri-infarct ischemia.    Overall Study Impression Myocardial perfusion is abnormal. This is a high risk study. Overall left ventricular systolic function was abnormal. Nuclear stress EF: 43%.      Jan 2017 cath  OM1 subtotally occluded with left to left collaterals coming from the apical LAD.  Severe in-stent restenosis of prior bare-metal stent which was placed in 2007 in the setting of his cardiac arrest. This was treated with cutting balloon angioplasty anddrug-eluting stent placement with a 2.5 x 28 Synergy drug-eluting stent, postdilatto greater than 3 m  Continue dual antiplatelet therapy indefinitely. The patient has significant memory loss after his cardiac arrest. Will discuss with his wife about the importance of staying on his dual antiplatelet therapy.   He'll be watched overnight with anticipated discharge tomorrow  04/2017 Nuclear stress  There was no ST segment deviation noted during  stress.  Findings consistent with prior anterolateral myocardial infarction with mild peri-infarct ischemia. Large lateral/inferolatera/inferior/inferoapical defect with moderate  ischemia  This is a high risk study. High risk due to low ejection fraction and multiple areas of ischemia. Mild area of ischemia anterolateral. Moderate area of ischemia lateral/inferior walls  The left ventricular ejection fraction is moderately decreased (30-44%).   04/2017 cath 1. Severe ostial left circumflex stenosis treated successfully with drug-eluting stent implantation (3.5 x 28 mm Sierra DES postdilated with a 4.0 mm noncompliant balloon) 2. Moderate diffuse in-stent restenosis of the LAD 3. Severe stenosis of the RCA ostium (nondominant vessel) 4. Normal LVEDP  Continue long-term dual antiplatelet therapy with aspirin and clopidogrel. As long as no complications arise patient is eligible for same day discharge.  04/2017 echo Study Conclusions  - Left ventricle: The cavity size was normal. Wall thickness was increased in a pattern of mild LVH. Systolic function was normal. The estimated ejection fraction was in the range of 50% to 55%. Left ventricular diastolic function parameters were normal. - Regional wall motion abnormality: Hypokinesis of the apical anterior, apical inferior, and apical myocardium. - Aortic valve: Valve area (VTI): 3.01 cm^2. Valve area (Vmax): 3.29 cm^2. - Technically difficult study.  07/2018 echo IMPRESSIONS   1. The left ventricle has normal systolic function, with an ejection fraction of 55-60%. The cavity size was normal. There is mildly increased left ventricular wall thickness. Left ventricular diastolic parameters were normal.The apex is hypokinetic 2. The right ventricle has normal systolic function. The cavity was normal. There is no increase in right ventricular wall thickness. 3. Left atrial size was mildly dilated. 4. Small  pericardial effusion. 5. The mitral valve is normal in structure. No evidence of mitral valve stenosis. 6. The tricuspid valve is normal in structure. 7. The aortic valve is tricuspid no stenosis of the aortic valve. 8. The aortic root is normal in size and structure. 9. Pulmonary hypertension is indeterminant, inadequate TR jet. 10. Right atrial pressure is estimated at 3 mmHg.  05/2019 nuclear stress  There was no ST segment deviation noted during stress.  Findings consistent with prior anterolateral/apical myocardial infarction with high level of peri-infarct ischemia.  The left ventricular ejection fraction is normal (55-65%).  High risk study based on degree of current ischemia.    05/2019 cath Severe calcific proximal LAD stenosis and moderate stenosis at the edge of the previously implanted mid LAD stent 2.  Continued patency of the stented segment in the proximal circumflex 3.  Chronic subtotal occlusion of the first OM Khaniyah Bezek of the circumflex, unchanged from previous studies 4.  Chronic occlusion of the diagonal Tylah Mancillas of the LAD collateralized from the apical LAD, also unchanged from previous studies 5.  Normal LVEDP 6. Successful PCI of the proximal LAD using a 2.75x26 mm Orsiro DES  Recommendations: Continue long-term dual antiplatelet therapy with aspirin and clopidogrel, aggressive medical therapy, candidate for same-day PCI discharge as long as no early complications arise.  Assessment and Plan  1. CAD/Chest pain/SOB - evaluation has always been complicated by his severe memory deficit as well as chronic left sided chest pain combined with his extensive prior history of CAD -no significant symptoms since most recent stent 05/2019, continue current meds   2. Hyperlipidemia - continue statin, request pcp labs.       Antoine Poche, M.D.

## 2019-10-18 NOTE — Patient Instructions (Addendum)
Medication Instructions:  Your physician recommends that you continue on your current medications as directed. Please refer to the Current Medication list given to you today.   Labwork: I will request labs from pcp.   Testing/Procedures: none  Follow-Up: Your physician wants you to follow-up in: 6 months,.  You will receive a reminder letter in the mail two months in advance. If you don't receive a letter, please call our office to schedule the follow-up appointment.   Any Other Special Instructions Will Be Listed Below (If Applicable).     If you need a refill on your cardiac medications before your next appointment, please call your pharmacy.

## 2019-12-27 ENCOUNTER — Ambulatory Visit: Payer: Medicare HMO | Admitting: Sports Medicine

## 2020-02-02 ENCOUNTER — Encounter: Payer: Self-pay | Admitting: Sports Medicine

## 2020-02-02 ENCOUNTER — Ambulatory Visit (INDEPENDENT_AMBULATORY_CARE_PROVIDER_SITE_OTHER): Payer: No Typology Code available for payment source | Admitting: Sports Medicine

## 2020-02-02 ENCOUNTER — Other Ambulatory Visit: Payer: Self-pay

## 2020-02-02 DIAGNOSIS — M19079 Primary osteoarthritis, unspecified ankle and foot: Secondary | ICD-10-CM | POA: Diagnosis not present

## 2020-02-02 DIAGNOSIS — M25571 Pain in right ankle and joints of right foot: Secondary | ICD-10-CM

## 2020-02-02 DIAGNOSIS — M79671 Pain in right foot: Secondary | ICD-10-CM

## 2020-02-02 DIAGNOSIS — M729 Fibroblastic disorder, unspecified: Secondary | ICD-10-CM | POA: Diagnosis not present

## 2020-02-02 DIAGNOSIS — M79672 Pain in left foot: Secondary | ICD-10-CM

## 2020-02-02 DIAGNOSIS — M25572 Pain in left ankle and joints of left foot: Secondary | ICD-10-CM

## 2020-02-02 NOTE — Progress Notes (Signed)
Subjective: [name] Cummings is a 59 y.o. male patient who returns to office for follow up evaluation of bilateral foot and ankle pain.  Wife states that he is doing good no pain. Reports that they went to get nails did a few weeks ago so he does not need them trimmed. No other issues noted.   Patient Active Problem List   Diagnosis Date Noted  . 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 Rash 08/28/2018  . Stevens-Johnson syndrome (HCC) 08/28/2018  . Demand ischemia (HCC) 08/28/2018  . Pain in thoracic spine 04/14/2018  . Dizziness 01/25/2018  . Excessive sleepiness 01/25/2018  . Imbalance 06/09/2017  . Impacted cerumen of right ear 06/09/2017  . Tinnitus of right ear 06/09/2017  . Abnormal nuclear cardiac imaging test 05/15/2017  . Physical exam, annual 09/05/2015  . Numbness and tingling of left side of face 08/16/2015  . Left sided numbness 08/15/2015  . Anxiety 07/21/2015  . Unstable angina (HCC) 07/21/2015  . Hypertensive heart disease   . Hyperlipidemia, mixed   . CAD (coronary artery disease)   . Brain anoxic injury (HCC)   . Angina pectoris (HCC)   . 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 CAD, NATIVE VESSEL 07/16/2009  . 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 Hyperlipidemia LDL goal <70 11/10/2008  . Anxiety state 11/10/2008  . Depression 11/10/2008  . Essential hypertension 11/10/2008  . CARDIOMYOPATHY, ISCHEMIC 11/10/2008  . VENTRICULAR FIBRILLATION 11/10/2008  . DYSPHAGIA UNSPECIFIED 11/10/2008  . SEIZURE DISORDER, HX OF 11/10/2008  . Dysphagia 11/10/2008    Current Outpatient Medications on File Prior to Visit  Medication Sig Dispense Refill  . amoxicillin (AMOXIL) 875 MG tablet     . aspirin EC 81 MG tablet Take 81 mg by mouth daily.    . busPIRone (BUSPAR) 15 MG tablet     . busPIRone (BUSPAR) 5 MG tablet Take 5 mg by mouth 3 (three) times daily.     . carvedilol (COREG) 25 MG tablet Take 1 tablet (25 mg total) 2 (two) times daily with a meal by mouth. 180 tablet 3  .  clopidogrel (PLAVIX) 75 MG tablet Take 1 tablet (75 mg total) daily by mouth. Same day PCI 90 tablet 3  . cyclobenzaprine (FLEXERIL) 10 MG tablet Take 10 mg by mouth at bedtime.    Marland Kitchen DEXILANT 60 MG capsule Take 60 mg by mouth daily.    Marland Kitchen FLUoxetine (PROZAC) 20 MG capsule Take 20 mg by mouth at bedtime.     . fluticasone (FLONASE) 50 MCG/ACT nasal spray Place 1 spray into both nostrils daily as needed for allergies.     Marland Kitchen HYDROcodone-acetaminophen (NORCO/VICODIN) 5-325 MG tablet Take 1 tablet by mouth 4 (four) times daily as needed for moderate pain.     Marland Kitchen loratadine (CLARITIN) 10 MG tablet Take 10 mg daily as needed by mouth for allergies.     . meloxicam (MOBIC) 7.5 MG tablet Take 7.5 mg by mouth daily.    . nitroGLYCERIN (NITROSTAT) 0.4 MG SL tablet Place 1 tablet (0.4 mg total) every 5 (five) minutes as needed under the tongue for chest pain. 25 tablet 3  . oxybutynin (DITROPAN) 5 MG tablet Take 5 mg by mouth 2 (two) times daily.    . pantoprazole (PROTONIX) 40 MG tablet Take 1 tablet (40 mg total) daily by mouth. 90 tablet 3  . VENTOLIN HFA 108 (90 Base) MCG/ACT inhaler Inhale 1-2 puffs into the lungs every 6 (six) hours as needed for wheezing or shortness of  breath.     . vitamin B-12 (CYANOCOBALAMIN) 1000 MCG tablet Take 1,000 mcg by mouth daily.    . Vitamin D, Ergocalciferol, (DRISDOL) 1.25 MG (50000 UT) CAPS capsule Take 50,000 Units by mouth every Tuesday.    . pravastatin (PRAVACHOL) 80 MG tablet Take 1 tablet (80 mg total) by mouth every evening. 90 tablet 3   No current facility-administered medications on file prior to visit.    Allergies  Allergen Reactions  . Sulfamethoxazole-Trimethoprim Rash    Sweet's syndrome   . Crestor [Rosuvastatin Calcium]     Myalgias  . Hydromorphone Hcl Other (See Comments)    Left side went numb  . Lorazepam Other (See Comments)    Hallucinations   . Pseudoephedrine Other (See Comments)    Caused seizure  . Terfenadine Other (See Comments)     Reaction not known  . 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  Dermatology: No open lesions bilateral lower extremities, no webspace macerations, no ecchymosis bilateral, all nails x 10 are short and mildly thickened but well manicured.  Vascular: Dorsalis Pedis and Posterior Tibial pedal pulses palpable, Capillary Fill Time 3 seconds,(+) pedal hair growth bilateral, no edema bilateral lower extremities, Temperature gradient within normal limits.  Neurology: Michaell Cowing sensation intact via light touch bilateral.  Musculoskeletal: No reproducible tenderness on today's exam. Range of motion within normal limits. Collapsing pes planus foot type.  Strength within normal limits in all groups bilateral.    Assessment and Plan: Problem List Items Addressed This Visit    None    Visit Diagnoses    Pain in both feet    -  Primary   Acute bilateral ankle pain       Fasciitis       Arthritis of ankle           -Complete examination performed -No pain on today's exam -Continue with stretching daily to prevent recurrence  -Advised good supportive shoe -Patient to return to office as needed.  Asencion Islam, DPM

## 2020-04-13 ENCOUNTER — Ambulatory Visit: Payer: No Typology Code available for payment source | Admitting: Cardiology

## 2020-05-04 ENCOUNTER — Ambulatory Visit (INDEPENDENT_AMBULATORY_CARE_PROVIDER_SITE_OTHER): Payer: No Typology Code available for payment source | Admitting: Cardiology

## 2020-05-04 ENCOUNTER — Telehealth: Payer: Self-pay | Admitting: Cardiology

## 2020-05-04 ENCOUNTER — Other Ambulatory Visit: Payer: Self-pay

## 2020-05-04 ENCOUNTER — Encounter: Payer: Self-pay | Admitting: Cardiology

## 2020-05-04 VITALS — BP 112/72 | HR 66 | Ht 73.5 in | Wt 249.0 lb

## 2020-05-04 DIAGNOSIS — E785 Hyperlipidemia, unspecified: Secondary | ICD-10-CM

## 2020-05-04 DIAGNOSIS — I2510 Atherosclerotic heart disease of native coronary artery without angina pectoris: Secondary | ICD-10-CM

## 2020-05-04 NOTE — Telephone Encounter (Signed)
Patient's wife states she is returning the pharmacist's call..please advise.

## 2020-05-04 NOTE — Progress Notes (Signed)
Clinical Summary [name] Cummings is a 59 y.o.male seen today for follow up of the following medical problems  History is obtained primarily from interview of his wife. Due to patient's history of anoxic brain injury and severe short term memory deficit  1. CAD - hx of BMS to LAD in 2007, followed by stent thrombosis with resulting VF arrest. LAD was restented at that time. - cath 2010 LAD 50% prox, 30% ISR in mid LAD, distal LAD 70-80%, LCX 40% ostial, 70-80% narrowing proximal portion LCX in the ramus [name] Cummings, RCA 70-80% proximal. LVEF 45%, the distal LAD was stented.  - cath Jan 2017 severe ISR of LAD stent, received another DES. Small OM1 subtotally occluded with left to left collaterals - echo Jan 2016 LVEF 60-65% - seen in [facility] 08/2015 with atypical chest pain, no evidence of ACS. Imdur was added.     04/2017 nuclear stress: anterolateral infarct with mild peri-infarct ischemia. Large lateral/inferolateral/inferior/inferioapical defect with moderate ischemia. LVEF 30-44% by nuclear.  - 04/2017 echo LVEF 50-55%, multiple WMAs  -04/2017 cath severe LCX ostial disease, received DES. Moderate ISR of LAD stent. Severe ostial disease of small nondominant RCA.   07/2018 echo LVEF 55-60%, apex hypokinetic - 05/2019 nuclear stress high degree of ischemia  - 05/2019 cath as reported below, received DES to prox LAD - given stent burden and prior stent thrombosis plan for indefinitie DAPT  -no recent chest pains - no SOB or DOE - compliant with meds.   2. 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 Patient is not able to give description of symptoms, symptoms are all relayed through his wife.  3. Hyperlipidemia - did not tolerate crestor or lipitor - has been on pravastatin - bring labs with him reporting LDL of 1343   SH: both of his wifes parents are in hospital with covid.   Past Medical History:  Diagnosis Date  . Anxiety   .  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  . CAD (coronary artery disease)    a. s/p BMS to LAD in 2007 with stent thrombosis and VF arrest and LAD re-stented b. DES to distal LAD in 2010 c. ISR and DES to LAD in 2017 d. DES to LCx in 04/2017 and severe ostial stenosis of small RCA e. 05/2019: DES to proximal LAD.   Marland Kitchen CHF (congestive heart failure) (HCC)   . Depression   . Dizziness   . Dysphagia, unspecified(787.20)   . GERD (gastroesophageal reflux disease)   . History of seizure disorder   . Hyperlipidemia, mixed   . Hypertension   . Hypertensive heart disease    unspecified  . Ischemic cardiomyopathy    a. 06/2014 Echo: EF 60-65%.  . 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  . Nephrolithiasis   . 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     Allergies  Allergen Reactions  . Sulfamethoxazole-Trimethoprim Rash    Sweet's syndrome   . Crestor [Rosuvastatin Calcium]     Myalgias  . Hydromorphone Hcl Other (See Comments)    Left side went numb  . Lorazepam Other (See Comments)    Hallucinations   . Pseudoephedrine Other (See Comments)    Caused seizure  . Terfenadine Other (See Comments)    Reaction not known  . Atorvastatin Other (See Comments)    Pt c/o myalgias  Current Outpatient Medications  Medication Sig Dispense Refill  . amoxicillin (AMOXIL) 875 MG tablet     . aspirin EC 81 MG tablet Take 81 mg by mouth daily.    . busPIRone (BUSPAR) 15 MG tablet     . busPIRone (BUSPAR) 5 MG tablet Take 5 mg by mouth 3 (three) times daily.     . carvedilol (COREG) 25 MG tablet Take 1 tablet (25 mg total) 2 (two) times daily with a meal by mouth. 180 tablet 3  . clopidogrel (PLAVIX) 75 MG tablet Take 1 tablet (75 mg total) daily by mouth. Same day PCI 90 tablet 3  . cyclobenzaprine (FLEXERIL) 10 MG tablet Take 10 mg by mouth at bedtime.    Marland Kitchen. DEXILANT 60 MG capsule Take 60 mg by mouth daily.    Marland Kitchen. FLUoxetine  (PROZAC) 20 MG capsule Take 20 mg by mouth at bedtime.     . fluticasone (FLONASE) 50 MCG/ACT nasal spray Place 1 spray into both nostrils daily as needed for allergies.     Marland Kitchen. HYDROcodone-acetaminophen (NORCO/VICODIN) 5-325 MG tablet Take 1 tablet by mouth 4 (four) times daily as needed for moderate pain.     Marland Kitchen. loratadine (CLARITIN) 10 MG tablet Take 10 mg daily as needed by mouth for allergies.     . meloxicam (MOBIC) 7.5 MG tablet Take 7.5 mg by mouth daily.    . nitroGLYCERIN (NITROSTAT) 0.4 MG SL tablet Place 1 tablet (0.4 mg total) every 5 (five) minutes as needed under the tongue for chest pain. 25 tablet 3  . oxybutynin (DITROPAN) 5 MG tablet Take 5 mg by mouth 2 (two) times daily.    . pantoprazole (PROTONIX) 40 MG tablet Take 1 tablet (40 mg total) daily by mouth. 90 tablet 3  . pravastatin (PRAVACHOL) 80 MG tablet Take 1 tablet (80 mg total) by mouth every evening. 90 tablet 3  . VENTOLIN HFA 108 (90 Base) MCG/ACT inhaler Inhale 1-2 puffs into the lungs every 6 (six) hours as needed for wheezing or shortness of breath.     . vitamin B-12 (CYANOCOBALAMIN) 1000 MCG tablet Take 1,000 mcg by mouth daily.    . Vitamin D, Ergocalciferol, (DRISDOL) 1.25 MG (50000 UT) CAPS capsule Take 50,000 Units by mouth every Tuesday.     No current facility-administered medications for this visit.     Past Surgical History:  Procedure Laterality Date  . bedside tracheostomy     with #6 Shiley. 04/14/2006  . CARDIAC CATHETERIZATION  09/2005   with bare metal stent to the mid LAD (Minivision 2.5 x 18 mm) with Provisional balloon angioplasty to the second diagonal.  . CARDIAC CATHETERIZATION  03/2006   with stenting of the mid LAD with Taxus 2.5 x 20 mm and 2.5 x 8 mm stents.      Marland Kitchen. CARDIAC CATHETERIZATION N/A 07/20/2015   Procedure: Left Heart Cath and Coronary Angiography;  Surgeon: Corky CraftsJayadeep S Varanasi, MD;  [facility];  Service: Cardiovascular;  Laterality: N/A;  . CARDIAC  CATHETERIZATION N/A 07/20/2015   Procedure: Coronary Stent Intervention;  Surgeon: Corky CraftsJayadeep S Varanasi, MD;  [facility];  Service: Cardiovascular;  Laterality: N/A;  . CORONARY STENT INTERVENTION N/A 05/15/2017   Procedure: CORONARY STENT INTERVENTION;  Surgeon: [name], [name], MD;  [facility];  Service: Cardiovascular;  Laterality: N/A;  . CORONARY STENT INTERVENTION N/A 05/27/2019   Procedure: CORONARY STENT INTERVENTION;  Surgeon: [name], [name], MD;  [facility];  Service: Cardiovascular;  Laterality: N/A;  . CORONARY STENT PLACEMENT  07/20/2015   LAD with DES  . ESOPHAGOGASTRODUODENOSCOPY    . KIDNEY STONE SURGERY    . LEFT HEART CATH AND CORONARY ANGIOGRAPHY N/A 05/15/2017   Procedure: LEFT HEART CATH AND CORONARY ANGIOGRAPHY;  Surgeon: Tonny Bollman, MD;  [facility];  Service: Cardiovascular;  Laterality: N/A;  . LEFT HEART CATH AND CORONARY ANGIOGRAPHY N/A 05/27/2019   Procedure: LEFT HEART CATH AND CORONARY ANGIOGRAPHY;  Surgeon: Tonny Bollman, MD;  [facility] CV LAB;  Service: Cardiovascular;  Laterality: N/A;  . PEG TUBE PLACEMENT    . PEG TUBE REMOVAL    . stent thrombosis  03/2006   Complicated my sudden cardiac death  . TRACHEOSTOMY CLOSURE       Allergies  Allergen Reactions  . Sulfamethoxazole-Trimethoprim Rash    Sweet's syndrome   . Crestor [Rosuvastatin Calcium]     Myalgias  . Hydromorphone Hcl Other (See Comments)    Left side went numb  . Lorazepam Other (See Comments)    Hallucinations   . Pseudoephedrine Other (See Comments)    Caused seizure  . Terfenadine Other (See Comments)    Reaction not known  . Atorvastatin Other (See Comments)    Pt c/o myalgias      Family History  Problem Relation Age of Onset  . Heart attack Mother   . Diabetes Mother   . Hydrocephalus Mother   . Heart attack Father   . Cancer Father        unsure of type  . Diabetes Sister   .  Coronary artery disease Neg Hx        Siblings     Social History Mr. Cuthbertson reports that he has never smoked. He quit smokeless tobacco use about 14 years ago.  His smokeless tobacco use included chew. Mr. Dottavio reports no history of alcohol use.   Review of Systems CONSTITUTIONAL: No weight loss, fever, chills, weakness or fatigue.  HEENT: Eyes: No visual loss, blurred vision, double vision or yellow sclerae.No hearing loss, sneezing, congestion, runny nose or sore throat.  SKIN: No rash or itching.  CARDIOVASCULAR: per hpi RESPIRATORY: No shortness of breath, cough or sputum.  GASTROINTESTINAL: No anorexia, nausea, vomiting or diarrhea. No abdominal pain or blood.  GENITOURINARY: No burning on urination, no polyuria NEUROLOGICAL: No headache, dizziness, syncope, paralysis, ataxia, numbness or tingling in the extremities. No change in bowel or bladder control.  MUSCULOSKELETAL: No muscle, back pain, joint pain or stiffness.  LYMPHATICS: No enlarged nodes. No history of splenectomy.  PSYCHIATRIC: No history of depression or anxiety.  ENDOCRINOLOGIC: No reports of sweating, cold or heat intolerance. No polyuria or polydipsia.  Marland Kitchen   Physical Examination Today's Vitals   05/04/20 0824  BP: 112/72  Pulse: 66  SpO2: 95%  Weight: 249 lb (112.9 kg)  Height: 6' 1.5" (1.867 m)   Body mass index is 32.41 kg/m.  Gen: resting comfortably, no acute distress HEENT: no scleral icterus, pupils equal round and reactive, no palptable cervical adenopathy,  CV: RRR, no m/r/g no jvd Resp: Clear to auscultation bilaterally GI: abdomen is soft, non-tender, non-distended, normal bowel sounds, no hepatosplenomegaly MSK: extremities are warm, no edema.  Skin: warm, no rash Neuro:  no focal deficits Psych: appropriate affect   Diagnostic Studies 11/2008 Cath RESULTS: Left main coronary artery: The left main coronary artery was free of significant disease.  Left anterior descending:  The left anterior descending artery  gave rise to small diagonal Letta Cargile and small septal perforator, and larger diagonal Ziaire Bieser. There was 30% narrowing within the midportion of the previously placed stents. Distal to the stent, there was an eccentric lesion, which appeared to be a ruptured plaque, which was estimated to be 70-80% obstructive. There also was 40-50% diffuse narrowing in the proximal LAD.  Circumflex artery: The circumflex artery gave rise to a large ramus Jordani Nunn and two posterolateral branches and a posterior descending Gratia Disla. This was a dominant vessel. There was 70-80% narrowing in the proximal portion in the ramus Indya Oliveria which was extended over about 18 mm. There was also a 40% ostial narrowing in the circumflex artery and 50% narrowing in the ostium of the ramus Woodley Petzold.  Right coronary artery: The right coronary artery was a nondominant vessel which supplied only right ventricular branches. There was a 70- 80% proximal stenosis.  Left ventriculogram: The left ventriculogram was performed in the RAO projection showed hypokinesis of the anterolateral wall and apex. The estimated fraction was 45%.  Following stenting of the lesion in the mid LAD, the stenosis improved from 80% to 0%.  CONCLUSIONS: 1. Coronary artery disease, status post previous percutaneous coronary  interventions as described above with 40% narrowing in the proximal  LAD, 30% narrowing within the stent in the mid LAD, and 80%  narrowing in the mid LAD after the stent, 70-80% narrowing in the  large ramus Kenyatta Keidel of the circumflex artery with 40% ostial  stenosis in the dominant circumflex artery, and 80% narrowing in  the proximal portion of a nondominant right coronary artery with  anterolateral wall and apical wall hypokinesis and estimated  ejection fraction of 45%. 2. Successful PCI of the lesion in the mid LAD distal to  the  previously placed stent using a XIENCE drug-eluting stent with  improvement in center narrowing from 80% to 0%.  DISPOSITION: The patient returned to [facility] room for further observation.    Jan 2016 echo Study Conclusions  - Left ventricle: The cavity size was normal. Wall thickness was normal. Systolic function was normal. The estimated ejection fraction was in the range of 60% to 65%. Indeterminant diastolic function. - Aortic valve: Mildly calcified annulus. Mildly thickened leaflets. - Mitral valve: Mildly calcified annulus. Mildly thickened leaflets . - Left atrium: The atrium was mildly dilated. - Systemic veins: IVC is small, suggestive of low RA pressure and hypovolemia. - Technically adequate study.   07/10/15 Clinic EKG (performed and reviewed in clinic): Sinus bradycardia  Jan 2017 Lexiscan MPI  Perfusion Summary Defect 1:  There is a large defect of moderate severity present in the basal anterolateral, mid anterolateral and apical lateral location. The defect is partially reversible. Large, moderate intensity, partially reversible anterolateral defect consistent with scar and at least moderate peri-infarct ischemia.    Overall Study Impression Myocardial perfusion is abnormal. This is a high risk study. Overall left ventricular systolic function was abnormal. Nuclear stress EF: 43%.      Jan 2017 cath  OM1 subtotally occluded with left to left collaterals coming from the apical LAD.  Severe in-stent restenosis of prior bare-metal stent which was placed in 2007 in the setting of his cardiac arrest. This was treated with cutting balloon angioplasty anddrug-eluting stent placement with a 2.5 x 28 Synergy drug-eluting stent, postdilatto greater than 3 m  Continue dual antiplatelet therapy indefinitely. The patient has significant memory loss after his cardiac arrest. Will discuss with his wife about the importance of  staying on his  dual antiplatelet therapy.   He'll be watched overnight with anticipated discharge tomorrow  04/2017 Nuclear stress  There was no ST segment deviation noted during stress.  Findings consistent with prior anterolateral myocardial infarction with mild peri-infarct ischemia. Large lateral/inferolatera/inferior/inferoapical defect with moderate ischemia  This is a high risk study. High risk due to low ejection fraction and multiple areas of ischemia. Mild area of ischemia anterolateral. Moderate area of ischemia lateral/inferior walls  The left ventricular ejection fraction is moderately decreased (30-44%).   04/2017 cath 1. Severe ostial left circumflex stenosis treated successfully with drug-eluting stent implantation (3.5 x 28 mm Sierra DES postdilated with a 4.0 mm noncompliant balloon) 2. Moderate diffuse in-stent restenosis of the LAD 3. Severe stenosis of the RCA ostium (nondominant vessel) 4. Normal LVEDP  Continue long-term dual antiplatelet therapy with aspirin and clopidogrel. As long as no complications arise patient is eligible for same day discharge.  04/2017 echo Study Conclusions  - Left ventricle: The cavity size was normal. Wall thickness was increased in a pattern of mild LVH. Systolic function was normal. The estimated ejection fraction was in the range of 50% to 55%. Left ventricular diastolic function parameters were normal. - Regional wall motion abnormality: Hypokinesis of the apical anterior, apical inferior, and apical myocardium. - Aortic valve: Valve area (VTI): 3.01 cm^2. Valve area (Vmax): 3.29 cm^2. - Technically difficult study.  07/2018 echo IMPRESSIONS   1. The left ventricle has normal systolic function, with an ejection fraction of 55-60%. The cavity size was normal. There is mildly increased left ventricular wall thickness. Left ventricular diastolic parameters were normal.The apex is hypokinetic 2. The  right ventricle has normal systolic function. The cavity was normal. There is no increase in right ventricular wall thickness. 3. Left atrial size was mildly dilated. 4. Small pericardial effusion. 5. The mitral valve is normal in structure. No evidence of mitral valve stenosis. 6. The tricuspid valve is normal in structure. 7. The aortic valve is tricuspid no stenosis of the aortic valve. 8. The aortic root is normal in size and structure. 9. Pulmonary hypertension is indeterminant, inadequate TR jet. 10. Right atrial pressure is estimated at 3 mmHg.  05/2019 nuclear stress  There was no ST segment deviation noted during stress.  Findings consistent with prior anterolateral/apical myocardial infarction with high level of peri-infarct ischemia.  The left ventricular ejection fraction is normal (55-65%).  High risk study based on degree of current ischemia.    05/2019 cath Severe calcific proximal LAD stenosis and moderate stenosis at the edge of the previously implanted mid LAD stent 2. Continued patency of the stented segment in the proximal circumflex 3. Chronic subtotal occlusion of the first OM Telitha Plath of the circumflex, unchanged from previous studies 4. Chronic occlusion of the diagonal Keyara Ent of the LAD collateralized from the apical LAD, also unchanged from previous studies 5. Normal LVEDP 6. Successful PCI of the proximal LAD using a 2.75x26 mm Orsiro DES  Recommendations: Continue long-term dual antiplatelet therapy with aspirin and clopidogrel, aggressive medical therapy, candidate for same-day PCI discharge as long as no early complications arise.     Assessment and Plan   1. CAD/Chest pain/SOB - evaluation has always been complicated by his severe memory deficit as well as chronic left sided chest pain combined with his extensive prior history of CAD -no recent symptoms - continue indefinite DAPT  2. Hyperlipidemia - difficulties tolerating  crestor and lipitor, on pravatstatin - LDL from insurance labs was 142. Refer to lipid clinic  to consider [id number] inhibitor    Antoine Poche, M.D.

## 2020-05-04 NOTE — Patient Instructions (Signed)
Medication Instructions:  Your physician recommends that you continue on your current medications as directed. Please refer to the Current Medication list given to you today.  *If you need a refill on your cardiac medications before your next appointment, please call your pharmacy*   Lab Work: NONE   If you have labs (blood work) drawn today and your tests are completely normal, you will receive your results only by: . MyChart Message (if you have MyChart) OR . A paper copy in the mail If you have any lab test that is abnormal or we need to change your treatment, we will call you to review the results.   Testing/Procedures: NONE    Follow-Up: At [facility], you and your health needs are our priority.  As part of our continuing mission to provide you with exceptional heart care, we have created designated Provider Care Teams.  These Care Teams include your primary Cardiologist (physician) and Advanced Practice Providers (APPs -  Physician Assistants and Nurse Practitioners) who all work together to provide you with the care you need, when you need it.  We recommend signing up for the patient portal called "MyChart".  Sign up information is provided on this After Visit Summary.  MyChart is used to connect with patients for Virtual Visits (Telemedicine).  Patients are able to view lab/test results, encounter notes, upcoming appointments, etc.  Non-urgent messages can be sent to your provider as well.   To learn more about what you can do with MyChart, go to [email or web address].    Your next appointment:   6 month(s)  The format for your next appointment:   In Person  Provider:   Jonathan Branch, MD   Other Instructions Thank you for choosing [facility]!    

## 2020-05-04 NOTE — Telephone Encounter (Signed)
I did not call this pt. Raquel did you?

## 2020-05-04 NOTE — Telephone Encounter (Signed)
Returned call to wife who states that someone called and left msg to schedule appt regarding lipid clinic referral.

## 2020-05-22 ENCOUNTER — Other Ambulatory Visit: Payer: Self-pay

## 2020-05-22 ENCOUNTER — Ambulatory Visit (INDEPENDENT_AMBULATORY_CARE_PROVIDER_SITE_OTHER)
Payer: No Typology Code available for payment source | Admitting: Pharmacist Clinician (PhC)/ Clinical Pharmacy Specialist

## 2020-05-22 VITALS — BP 120/80 | HR 63 | Resp 16 | Ht 73.0 in | Wt 254.4 lb

## 2020-05-22 DIAGNOSIS — E785 Hyperlipidemia, unspecified: Secondary | ICD-10-CM | POA: Diagnosis not present

## 2020-05-22 LAB — HEPATIC FUNCTION PANEL
ALT: 29 IU/L (ref 0–44)
AST: 21 IU/L (ref 0–40)
Albumin: 4.4 g/dL (ref 3.8–4.9)
Alkaline Phosphatase: 160 IU/L — ABNORMAL HIGH (ref 44–121)
Bilirubin Total: 0.4 mg/dL (ref 0.0–1.2)
Bilirubin, Direct: 0.11 mg/dL (ref 0.00–0.40)
Total Protein: 6.8 g/dL (ref 6.0–8.5)

## 2020-05-22 LAB — LIPID PANEL
Chol/HDL Ratio: 5.1 ratio — ABNORMAL HIGH (ref 0.0–5.0)
Cholesterol, Total: 225 mg/dL — ABNORMAL HIGH (ref 100–199)
HDL: 44 mg/dL (ref >39)
LDL Chol Calc (NIH): 159 mg/dL — ABNORMAL HIGH (ref 0–99)
Triglycerides: 121 mg/dL (ref 0–149)
VLDL Cholesterol Cal: 22 mg/dL (ref 5–40)

## 2020-05-22 NOTE — Progress Notes (Signed)
HPI: [name] Cummings is a 59 yo male with PMH below who presents today for lipid clinic evaluation. He was referred by Dr. Wyline Mood for his lipids to consider PCSK9 inhibitors since he has been intolerant to multiple statins and his LDL is above goal despite current treatment with pravastatin. He is tolerating pravastatin well and does not have any complaints today. He is here with his wife and the information is relayed through her as he has trouble with his memory.   PMH: CAD s/p BMS to LAD in 2007 with stent thrombosis and VF arrest and LAD re-stented. DES to distal LAD in 2010. ISR and DES to LAD in 2017. DES to LCx in 04/2017 and severe ostial stenosis of small RCA. 05/2019: DES to proximal LAD- indefinite DAPT given stent burden and prior stent thrombosis  Anoxic brain injury Severe short term memory deficit- Chronic issue since prior cardiac arrest   Goal LDL <70 mg/dL  Current Hyperlipidemia Medications: pravastatin 80 mg qd  Previously tried: Rosuvastatin and Atorvastatin both discontinued due to myalgias (patient said they made his legs hurt really bad)   Family Hx: Mother was in her 30s when she started having trouble with high cholesterol. His father has history of a MI and had open heart surgery in his 61s. Brother has high cholesterol and his sister had open heart surgery around age 24. He has 4 kids, but none of them have a history of high cholesterol or heart disease.   Social Hx: Negative for smoking and alcohol use  Diet: Cooks at home the majority of the time. He likes meatloaf, sausage and gravy, pinto beans, and cheeseburgers.   Physical activity: Does not do any physical activity. His wife tries to get him to walk but his hips or ankles start hurting so he doesn't want to. He has bone spurs in his ankles which causes him pain when walking.  Labs: 09/27/19 TC 190, TG 130, HDL 39, LDL 126. Will recheck lipid panel today to get updated labs and see where his LDL is prior to starting a new  medication.   Assessment/Plan: LDL is currently not at goal despite being treated with pravastatin 80 mg. Discussed ezetimibe, nexletol, and PCSK9 inhibitors in depth including how they work, the benefits of each, and the most common adverse effects. Patient and his wife agreed that they would like to try a PCSK9 inhibitor, and they feel comfortable with giving the injections. Will start the paperwork for the PA to get it approved through his insurance. Once approved, will contact him and let him know it is ready for pick up. He was given a copay card to bring to the pharmacy. Will start repatha 140 mg injections every 14 days and repeat labs in 2-3 months to check cholesterol.   Fanny Dance, PharmD Candidate [employer] Class of 2022  I was with student and patient thru entire interview and agree with the above assessment and plan.  Phillips Hay PharmD CPP [facility]

## 2020-05-22 NOTE — Progress Notes (Deleted)
Patient ID: [name] Cummings                 DOB: [birth year]                    MRN: [id number]     HPI: [name] Cummings is a 59 y.o. male patient referred to lipid clinic by Dr Wyline Mood. PMH is significant for   Current Medications: Pravastatin 80mg  daily  Intolerances:  Atorvastatin 40mg  daily Atorvastatin 20mg  daily Atorvastatin 10mg  daily Rosuvastatin 20mg  daily Rosuvastatin 10mg  daily Pravastatin 40mg  daily Pravastatin 20mg  daily Simvastatin 20mg  daily  LDL goal: < 70mg /dL  Diet:   Exercise:   Family History:   Social History:   Labs:  Past Medical History:  Diagnosis Date  . Anxiety   . 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  . CAD (coronary artery disease)    a. s/p BMS to LAD in 2007 with stent thrombosis and VF arrest and LAD re-stented b. DES to distal LAD in 2010 c. ISR and DES to LAD in 2017 d. DES to LCx in 04/2017 and severe ostial stenosis of small RCA e. 05/2019: DES to proximal LAD.   CHF (congestive heart failure) (HCC)   . Depression   . Dizziness   . Dysphagia, unspecified(787.20)   . GERD (gastroesophageal reflux disease)   . History of seizure disorder   . Hyperlipidemia, mixed   . Hypertension   . Hypertensive heart disease    unspecified  . Ischemic cardiomyopathy    a. 06/2014 Echo: EF 60-65%.  . 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  . Nephrolithiasis   . 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    Current Outpatient Medications on File Prior to Visit  Medication Sig Dispense Refill  . aspirin EC 81 MG tablet Take 81 mg by mouth daily.    . busPIRone (BUSPAR) 15 MG tablet     . carvedilol (COREG) 25 MG tablet Take 1 tablet (25 mg total) 2 (two) times daily with a meal by mouth. 180 tablet 3  . clopidogrel (PLAVIX) 75 MG tablet Take 1 tablet (75 mg total) daily by mouth. Same day PCI 90 tablet 3  . cyclobenzaprine (FLEXERIL) 10 MG tablet Take 10 mg by mouth at bedtime.      FLUoxetine (PROZAC) 20 MG capsule Take 20 mg by mouth at bedtime.     . fluticasone (FLONASE) 50 MCG/ACT nasal spray Place 1 spray into both nostrils daily as needed for allergies.     2008 HYDROcodone-acetaminophen (NORCO/VICODIN) 5-325 MG tablet Take 1 tablet by mouth 4 (four) times daily as needed for moderate pain.     2008 loratadine (CLARITIN) 10 MG tablet Take 10 mg daily as needed by mouth for allergies.     . meloxicam (MOBIC) 7.5 MG tablet Take 7.5 mg by mouth daily.    . nitroGLYCERIN (NITROSTAT) 0.4 MG SL tablet Place 1 tablet (0.4 mg total) every 5 (five) minutes as needed under the tongue for chest pain. 25 tablet 3  . oxybutynin (DITROPAN) 5 MG tablet Take 5 mg by mouth 2 (two) times daily.    . pantoprazole (PROTONIX) 40 MG tablet Take 1 tablet (40 mg total) daily by mouth. 90 tablet 3  . pravastatin (PRAVACHOL) 80 MG tablet Take 1 tablet (80 mg total) by mouth every evening. 90 tablet 3  .  VENTOLIN HFA 108 (90 Base) MCG/ACT inhaler Inhale 1-2 puffs into the lungs every 6 (six) hours as needed for wheezing or shortness of breath.     . Vitamin D, Ergocalciferol, (DRISDOL) 1.25 MG (50000 UT) CAPS capsule Take 50,000 Units by mouth every Tuesday.     No current facility-administered medications on file prior to visit.    Allergies  Allergen Reactions  . Sulfamethoxazole-Trimethoprim Rash    Sweet's syndrome   . Crestor [Rosuvastatin Calcium]     Myalgias  . Hydromorphone Hcl Other (See Comments)    Left side went numb  . Lorazepam Other (See Comments)    Hallucinations   . Pseudoephedrine Other (See Comments)    Caused seizure  . Terfenadine Other (See Comments)    Reaction not known  . Atorvastatin Other (See Comments)    Pt c/o myalgias    No problem-specific Assessment & Plan notes found for this encounter.      Jeffrey Voth Rodriguez-Guzman PharmD, BCPS, CPP [facility] [address] [place] [address] 05/22/2020 7:53 AM

## 2020-05-22 NOTE — Patient Instructions (Addendum)
Your Results:             Your most recent labs Goal  Total Cholesterol  < 200  Triglycerides  < 150  HDL (happy/good cholesterol)  > 40  LDL (lousy/bad cholesterol  < 70   < 100   Medication changes:  We will start the process to get Repatha (or Praluent) covered by your insurance.  Once approved, we will contact you to let you know it is available for pick up.  After 5-6 doses, (about 2-3 months) he will need to repeat labs to check cholesterol    Thank you for choosing [facility]

## 2020-05-23 ENCOUNTER — Telehealth: Payer: Self-pay

## 2020-05-23 DIAGNOSIS — E785 Hyperlipidemia, unspecified: Secondary | ICD-10-CM

## 2020-05-23 MED ORDER — REPATHA SURECLICK 140 MG/ML ~~LOC~~ SOAJ: 140.0000 mg | SUBCUTANEOUS | 11 refills | Status: DC

## 2020-05-23 NOTE — Telephone Encounter (Signed)
Called and spoke w/pt regarding the approval of the repatha, rx sent, ordersplaced for fasting lipid and lft, pt instructed to complete them after 4th injection, pt voiced understanding

## 2020-05-23 NOTE — Telephone Encounter (Signed)
This encounter was created in error - please disregard.

## 2020-06-25 ENCOUNTER — Telehealth: Payer: Self-pay | Admitting: Cardiology

## 2020-06-25 NOTE — Telephone Encounter (Signed)
I spoke with wife. She states her husbands left breast appears swollen, no redness or warmth , but, she gave him a NTG on 06/23/20 and pain was relieved.Today she gave him Norco and pain was relieved. She made apt for 12/29 with PA to be evaluated. I also encouraged her to speak with her PCP as she states he has had mammogram in the past for this.

## 2020-06-25 NOTE — Telephone Encounter (Signed)
New message     Patient wife called , patient has been having chest pressure off and on that comes and goes, right behind the left breast area.

## 2020-06-26 NOTE — Progress Notes (Signed)
Cardiology Office Note    Date:  06/27/2020   ID:  [name] Cummings, DOB 03/01/1961, MRN [id number]  PCP:  Barbie BannerWilson, Fred H, MD  Cardiologist: Dina RichBranch, Jonathan, MD    Chief Complaint  Patient presents with  . Follow-up    Recent chest pain    History of Present Illness:    [name] PumaJesse D Bellemare is a 59 y.o. male with past medical history of CAD (s/p BMS to LAD in 2007 with stent thrombosis and VF arrest and LAD re-stented, DESto distal LAD in 2010, ISR and DES to LAD in 2017, DES to LCx in 04/2017 and severe ostial stenosis of small RCA, DES to proximal LAD in 05/2019), anoxic brain injury (following VF arrest in 2007with persistent short-term memory loss), HTN, and HLD who presents to the office today for evaluation of chest pain.   He was last examined by Dr. Wyline MoodBranch in 04/2020 and denied any recent chest pain or dyspnea at that time. It was recommended to continue DAPT indefinitely given his stent burden and prior stent thrombosis. LDL was elevated to 142 by most recent labs and he was referred to the lipid clinic for consideration of PCSK9 inhibitor therapy.    The patient's wife called the office on 06/25/2020 to report he had been having intermittent episodes of chest pain along with his left breast appearing swollen and a follow-up visit was arranged for today.  In talking with the patient and his wife today, history is provided by his wife due to his short-term memory loss. She reports he had chest pain last weekend which he said felt like a vice in his chest. Did not report dyspnea, nausea or diaphoresis. He did take SL NTG with improvement in his symptoms. Also reported left breast swelling but this has since resolved. He has experienced episodes since but they have only lasted a few seconds and spontaneously resolved.    Past Medical History:  Diagnosis Date  . Anxiety   . 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  . CAD (coronary artery disease)    a. s/p BMS  to LAD in 2007 with stent thrombosis and VF arrest and LAD re-stented b. DES to distal LAD in 2010 c. ISR and DES to LAD in 2017 d. DES to LCx in 04/2017 and severe ostial stenosis of small RCA e. 05/2019: DES to proximal LAD.   Marland Kitchen. CHF (congestive heart failure) (HCC)   . Depression   . Dizziness   . Dysphagia, unspecified(787.20)   . GERD (gastroesophageal reflux disease)   . History of seizure disorder   . Hyperlipidemia, mixed   . Hypertension   . Hypertensive heart disease    unspecified  . Ischemic cardiomyopathy    a. 06/2014 Echo: EF 60-65%.  . 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  . Nephrolithiasis   . 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    Past Surgical History:  Procedure Laterality Date  . bedside tracheostomy     with #6 Shiley. 04/14/2006  . CARDIAC CATHETERIZATION  09/2005   with bare metal stent to the mid LAD (Minivision 2.5 x 18 mm) with Provisional balloon angioplasty to the second diagonal.  . CARDIAC CATHETERIZATION  03/2006   with stenting of the mid LAD with Taxus 2.5 x 20 mm and 2.5 x 8 mm stents.      Marland Kitchen. CARDIAC CATHETERIZATION N/A 07/20/2015  Procedure: Left Heart Cath and Coronary Angiography;  Surgeon: Corky Crafts, MD;  Location: [facility] INVASIVE CV LAB;  Service: Cardiovascular;  Laterality: N/A;  . CARDIAC CATHETERIZATION N/A 07/20/2015   Procedure: Coronary Stent Intervention;  Surgeon: Corky Crafts, MD;  Location: [facility] INVASIVE CV LAB;  Service: Cardiovascular;  Laterality: N/A;  . CORONARY STENT INTERVENTION N/A 05/15/2017   Procedure: CORONARY STENT INTERVENTION;  Surgeon: Tonny Bollman, MD;  Location: [facility] INVASIVE CV LAB;  Service: Cardiovascular;  Laterality: N/A;  . CORONARY STENT INTERVENTION N/A 05/27/2019   Procedure: CORONARY STENT INTERVENTION;  Surgeon: Tonny Bollman, MD;  Location: [facility] INVASIVE CV LAB;  Service: Cardiovascular;  Laterality: N/A;  . CORONARY STENT PLACEMENT  07/20/2015    LAD with DES  . ESOPHAGOGASTRODUODENOSCOPY    . KIDNEY STONE SURGERY    . LEFT HEART CATH AND CORONARY ANGIOGRAPHY N/A 05/15/2017   Procedure: LEFT HEART CATH AND CORONARY ANGIOGRAPHY;  Surgeon: Tonny Bollman, MD;  Location: [facility] INVASIVE CV LAB;  Service: Cardiovascular;  Laterality: N/A;  . LEFT HEART CATH AND CORONARY ANGIOGRAPHY N/A 05/27/2019   Procedure: LEFT HEART CATH AND CORONARY ANGIOGRAPHY;  Surgeon: Tonny Bollman, MD;  Location: [facility] INVASIVE CV LAB;  Service: Cardiovascular;  Laterality: N/A;  . PEG TUBE PLACEMENT    . PEG TUBE REMOVAL    . stent thrombosis  03/2006   Complicated my sudden cardiac death  . TRACHEOSTOMY CLOSURE      Current Medications: Outpatient Medications Prior to Visit  Medication Sig Dispense Refill  . aspirin EC 81 MG tablet Take 81 mg by mouth daily.    . busPIRone (BUSPAR) 15 MG tablet     . carvedilol (COREG) 25 MG tablet Take 1 tablet (25 mg total) 2 (two) times daily with a meal by mouth. 180 tablet 3  . clopidogrel (PLAVIX) 75 MG tablet Take 1 tablet (75 mg total) daily by mouth. Same day PCI 90 tablet 3  . cyclobenzaprine (FLEXERIL) 10 MG tablet Take 10 mg by mouth at bedtime.    . Evolocumab (REPATHA SURECLICK) 140 MG/ML SOAJ Inject 140 mg into the skin every 14 (fourteen) days. 2 mL 11  . FLUoxetine (PROZAC) 20 MG capsule Take 20 mg by mouth at bedtime.     . fluticasone (FLONASE) 50 MCG/ACT nasal spray Place 1 spray into both nostrils daily as needed for allergies.     Marland Kitchen HYDROcodone-acetaminophen (NORCO/VICODIN) 5-325 MG tablet Take 1 tablet by mouth 4 (four) times daily as needed for moderate pain.     Marland Kitchen loratadine (CLARITIN) 10 MG tablet Take 10 mg daily as needed by mouth for allergies.     . meloxicam (MOBIC) 7.5 MG tablet Take 7.5 mg by mouth daily.    . nitroGLYCERIN (NITROSTAT) 0.4 MG SL tablet Place 1 tablet (0.4 mg total) every 5 (five) minutes as needed under the tongue for chest pain. 25 tablet 3  . oxybutynin (DITROPAN) 5 MG  tablet Take 5 mg by mouth 2 (two) times daily.    . pantoprazole (PROTONIX) 40 MG tablet Take 1 tablet (40 mg total) daily by mouth. 90 tablet 3  . VENTOLIN HFA 108 (90 Base) MCG/ACT inhaler Inhale 1-2 puffs into the lungs every 6 (six) hours as needed for wheezing or shortness of breath.     . Vitamin D, Ergocalciferol, (DRISDOL) 1.25 MG (50000 UT) CAPS capsule Take 50,000 Units by mouth every Tuesday.    . pravastatin (PRAVACHOL) 80 MG tablet Take 1 tablet (80 mg total) by mouth  every evening. 90 tablet 3  . carvedilol (COREG) 12.5 MG tablet      No facility-administered medications prior to visit.     Allergies:   Sulfamethoxazole-trimethoprim, Crestor [rosuvastatin calcium], Hydromorphone hcl, Lorazepam, Other, Pseudoephedrine, Terfenadine, and Atorvastatin   Social History   Socioeconomic History  . Marital status: Married    Spouse name: Not on file  . Number of children: 4  . Years of education: 83  . Highest education level: Not on file  Occupational History  . Occupation: Disabled  Tobacco Use  . Smoking status: Never Smoker  . Smokeless tobacco: Former Neurosurgeon    Types: Engineer, drilling  . Vaping Use: Never used  Substance and Sexual Activity  . Alcohol use: No    Alcohol/week: 0.0 standard drinks  . Drug use: No  . Sexual activity: Not on file  Other Topics Concern  . Not on file  Social History Narrative   Lives at home with wife.   Right-handed.   One soda and tea daily.   Social Determinants of Health   Financial Resource Strain: Not on file  Food Insecurity: Not on file  Transportation Needs: Not on file  Physical Activity: Not on file  Stress: Not on file  Social Connections: Not on file     Family History:  The patient's family history includes Cancer in his father; Diabetes in his mother and sister; Heart attack in his father and mother; Hydrocephalus in his mother.   Review of Systems:   Please see the history of present illness.     General:  No  chills, fever, night sweats or weight changes.  Cardiovascular:  No dyspnea on exertion, edema, orthopnea, palpitations, paroxysmal nocturnal dyspnea. Positive for chest pain.  Dermatological: No rash, lesions/masses Respiratory: No cough, dyspnea Urologic: No hematuria, dysuria Abdominal:   No nausea, vomiting, diarrhea, bright red blood per rectum, melena, or hematemesis Neurologic:  No visual changes, wkns, changes in mental status. All other systems reviewed and are otherwise negative except as noted above.   Physical Exam:    VS:  BP 138/76   Pulse 62   Ht 6' 1.5" (1.867 m)   Wt 255 lb 9.6 oz (115.9 kg)   SpO2 97%   BMI 33.27 kg/m    General: Well developed, well nourished,male appearing in no acute distress. Head: Normocephalic, atraumatic. Neck: No carotid bruits. JVD not elevated.  Lungs: Respirations regular and unlabored, without wheezes or rales.  Heart: Regular rate and rhythm. No S3 or S4.  No murmur, no rubs, or gallops appreciated. Left pectoral region not tender to palpation. No erythema or swelling appreciated.  Abdomen: Appears non-distended. No obvious abdominal masses. Msk:  Strength and tone appear normal for age. No obvious joint deformities or effusions. Extremities: No clubbing or cyanosis. No edema.  Distal pedal pulses are 2+ bilaterally. Neuro: Alert and oriented X 2 (person, place). Moves all extremities spontaneously. No focal deficits noted. Psych:  Short-term memory loss. Skin: No rashes or lesions noted  Wt Readings from Last 3 Encounters:  06/27/20 255 lb 9.6 oz (115.9 kg)  05/22/20 254 lb 6.4 oz (115.4 kg)  05/04/20 249 lb (112.9 kg)     Studies/Labs Reviewed:   EKG:  EKG is ordered today. The ekg ordered today demonstrates NSR, HR 65 with no acute ST changes when compared to prior tracings.   Recent Labs: 05/22/2020: ALT 29   Lipid Panel    Component Value Date/Time   CHOL 225 (H)  05/22/2020 0914   TRIG 121 05/22/2020 0914   HDL 44  05/22/2020 0914   CHOLHDL 5.1 (H) 05/22/2020 0914   CHOLHDL 4.8 04/21/2017 1617   VLDL 72 (H) 04/21/2017 1617   LDLCALC 159 (H) 05/22/2020 0914    Additional studies/ records that were reviewed today include:   Echocardiogram: 07/2018 IMPRESSIONS    1. The left ventricle has normal systolic function, with an ejection  fraction of 55-60%. The cavity size was normal. There is mildly increased  left ventricular wall thickness. Left ventricular diastolic parameters  were normal.The apex is hypokinetic  2. The right ventricle has normal systolic function. The cavity was  normal. There is no increase in right ventricular wall thickness.  3. Left atrial size was mildly dilated.  4. Small pericardial effusion.  5. The mitral valve is normal in structure. No evidence of mitral valve  stenosis.  6. The tricuspid valve is normal in structure.  7. The aortic valve is tricuspid no stenosis of the aortic valve.  8. The aortic root is normal in size and structure.  9. Pulmonary hypertension is indeterminant, inadequate TR jet.  10. Right atrial pressure is estimated at 3 mmHg.    Cardiac Catheterization: 05/2019 1.  Severe calcific proximal LAD stenosis and moderate stenosis at the edge of the previously implanted mid LAD stent 2.  Continued patency of the stented segment in the proximal circumflex 3.  Chronic subtotal occlusion of the first OM branch of the circumflex, unchanged from previous studies 4.  Chronic occlusion of the diagonal branch of the LAD collateralized from the apical LAD, also unchanged from previous studies 5.  Normal LVEDP 6. Successful PCI of the proximal LAD using a 2.75x26 mm Orsiro DES  Recommendations: Continue long-term dual antiplatelet therapy with aspirin and clopidogrel, aggressive medical therapy, candidate for same-day PCI discharge as long as no early complications arise.   Assessment:    1. Coronary artery disease involving native coronary  artery of native heart with unstable angina pectoris (HCC)   2. Chest pain, unspecified type   3. Essential hypertension   4. Hyperlipidemia LDL goal <70   5. Brain anoxic injury (HCC)      Plan:   In order of problems listed above:  1. Chest Pain with Mixed Features/CAD - He is s/p BMS to LAD in 2007 with stent thrombosis and VF arrest and LAD re-stented, DESto distal LAD in 2010, ISR and DES to LAD in 2017, DES to LCx in 04/2017 and severe ostial stenosis of small RCA, and most recently DES to proximal LAD in 05/2019. - His wife reports he experienced episodes of chest pain this past weekend which improved with SL NTG. Only brief episodes since which have spontaneously resolved but he is unable to elaborate on this given his short-term memory loss. Reviewed options with them and will plan to obtain a Lexiscan Myoview for ischemic evaluation. His wife can be present during the test. Will also restart Imdur  daily given his pain was responsive to SL NTG.  - Continue ASA, Plavix, Coreg and Pravastatin.   2. HTN - BP is well-controlled at 138/76 during today's visit. Continue Coreg  BID and will restart Imdur  daily as outlined above.   3. HLD - FLP in 04/2020 showed LDL was elevated to 159. He is suppose to start Repatha soon but they are waiting for it to arrive. Continue Pravastatin  daily.  4. History of Anoxic Brain Injury - He does suffer from short-term  memory loss and history is provided by his wife.     Medication Adjustments/Labs and Tests Ordered: Current medicines are reviewed at length with the patient today.  Concerns regarding medicines are outlined above.  Medication changes, Labs and Tests ordered today are listed in the Patient Instructions below. Patient Instructions  Medication Instructions:  START Imdur 30 mg tablets daily *If you need a refill on your cardiac medications before your next appointment, please call your pharmacy*   Lab Work: None  Today If you have labs (blood work) drawn today and your tests are completely normal, you will receive your results only by: Marland Kitchen MyChart Message (if you have MyChart) OR . A paper copy in the mail If you have any lab test that is abnormal or we need to change your treatment, we will call you to review the results.   Testing/Procedures: Your physician has requested that you have a lexiscan myoview. For further information please visit [email or web address] Please follow instruction sheet, as given.     Follow-Up: At [facility], you and your health needs are our priority.  As part of our continuing mission to provide you with exceptional heart care, we have created designated Provider Care Teams.  These Care Teams include your primary Cardiologist (physician) and Advanced Practice Providers (APPs -  Physician Assistants and Nurse Practitioners) who all work together to provide you with the care you need, when you need it.  We recommend signing up for the patient portal called "MyChart".  Sign up information is provided on this After Visit Summary.  MyChart is used to connect with patients for Virtual Visits (Telemedicine).  Patients are able to view lab/test results, encounter notes, upcoming appointments, etc.  Non-urgent messages can be sent to your provider as well.   To learn more about what you can do with MyChart, go to [email or web address].    Your next appointment:   2 month(s)  The format for your next appointment:   In Person  Provider:   You may see Dina Rich, MD or one of the following Advanced Practice Providers on your designated Care Team:    Randall An, PA-C     Other Instructions None Today     Signed, Ellsworth Lennox, Cordelia Poche  06/27/2020 7:21 PM    [facility] [address] [place], [place] [address] Phone: [phone] Fax: [phone]

## 2020-06-27 ENCOUNTER — Encounter: Payer: Self-pay | Admitting: Student

## 2020-06-27 ENCOUNTER — Ambulatory Visit (INDEPENDENT_AMBULATORY_CARE_PROVIDER_SITE_OTHER): Payer: No Typology Code available for payment source | Admitting: Student

## 2020-06-27 ENCOUNTER — Other Ambulatory Visit: Payer: Self-pay

## 2020-06-27 VITALS — BP 138/76 | HR 62 | Ht 73.5 in | Wt 255.6 lb

## 2020-06-27 DIAGNOSIS — I10 Essential (primary) hypertension: Secondary | ICD-10-CM | POA: Diagnosis not present

## 2020-06-27 DIAGNOSIS — I25110 Atherosclerotic heart disease of native coronary artery with unstable angina pectoris: Secondary | ICD-10-CM | POA: Diagnosis not present

## 2020-06-27 DIAGNOSIS — R079 Chest pain, unspecified: Secondary | ICD-10-CM | POA: Diagnosis not present

## 2020-06-27 DIAGNOSIS — E785 Hyperlipidemia, unspecified: Secondary | ICD-10-CM

## 2020-06-27 DIAGNOSIS — G931 Anoxic brain damage, not elsewhere classified: Secondary | ICD-10-CM

## 2020-06-27 MED ORDER — ISOSORBIDE MONONITRATE ER 30 MG PO TB24: 30.0000 mg | ORAL_TABLET | Freq: Every day | ORAL | 3 refills | Status: DC

## 2020-06-27 MED ORDER — PRAVASTATIN SODIUM 80 MG PO TABS: 80.0000 mg | ORAL_TABLET | Freq: Every evening | ORAL | 3 refills | Status: DC

## 2020-06-27 NOTE — Patient Instructions (Signed)
Medication Instructions:  START Imdur 30 mg tablets daily *If you need a refill on your cardiac medications before your next appointment, please call your pharmacy*   Lab Work: None Today If you have labs (blood work) drawn today and your tests are completely normal, you will receive your results only by:  MyChart Message (if you have MyChart) OR  A paper copy in the mail If you have any lab test that is abnormal or we need to change your treatment, we will call you to review the results.   Testing/Procedures: Your physician has requested that you have a lexiscan myoview. For further information please visit [email or web address] Please follow instruction sheet, as given.     Follow-Up: At [facility], you and your health needs are our priority.  As part of our continuing mission to provide you with exceptional heart care, we have created designated Provider Care Teams.  These Care Teams include your primary Cardiologist (physician) and Advanced Practice Providers (APPs -  Physician Assistants and Nurse Practitioners) who all work together to provide you with the care you need, when you need it.  We recommend signing up for the patient portal called "MyChart".  Sign up information is provided on this After Visit Summary.  MyChart is used to connect with patients for Virtual Visits (Telemedicine).  Patients are able to view lab/test results, encounter notes, upcoming appointments, etc.  Non-urgent messages can be sent to your provider as well.   To learn more about what you can do with MyChart, go to [email or web address].    Your next appointment:   2 month(s)  The format for your next appointment:   In Person  Provider:   You may see Dina Rich, MD or one of the following Advanced Practice Providers on your designated Care Team:    Randall An, PA-C     Other Instructions None Today

## 2020-06-28 NOTE — Addendum Note (Signed)
Addended by: Kerney Elbe on: 06/28/2020 03:46 PM   Modules accepted: Orders

## 2020-07-05 ENCOUNTER — Other Ambulatory Visit: Payer: Self-pay

## 2020-07-05 ENCOUNTER — Encounter (HOSPITAL_COMMUNITY): Payer: Self-pay

## 2020-07-05 ENCOUNTER — Encounter (HOSPITAL_BASED_OUTPATIENT_CLINIC_OR_DEPARTMENT_OTHER)
Admission: RE | Admit: 2020-07-05 | Discharge: 2020-07-05 | Disposition: A | Discharge status: Home / Self Care | Payer: 59 | Source: Ambulatory Visit | Attending: Student | Admitting: Student

## 2020-07-05 ENCOUNTER — Encounter (HOSPITAL_COMMUNITY)
Admission: RE | Admit: 2020-07-05 | Discharge: 2020-07-05 | Disposition: A | Discharge status: Home / Self Care | Payer: 59 | Source: Ambulatory Visit | Attending: Student | Admitting: Student

## 2020-07-05 DIAGNOSIS — R079 Chest pain, unspecified: Secondary | ICD-10-CM

## 2020-07-05 LAB — NM MYOCAR MULTI W/SPECT W/WALL MOTION / EF
LV dias vol: 164 mL (ref 62–150)
LV sys vol: 77 mL
Peak HR: 94 {beats}/min
RATE: 0.61
Rest HR: 57 {beats}/min
SDS: 10
SRS: 8
SSS: 18
TID: 1.21

## 2020-07-05 MED ORDER — TECHNETIUM TC 99M TETROFOSMIN IV KIT
10.0000 | PACK | Freq: Once | INTRAVENOUS | Status: AC | PRN
  Administered 2020-07-05: 10.3 via INTRAVENOUS

## 2020-07-05 MED ORDER — TECHNETIUM TC 99M TETROFOSMIN IV KIT
30.0000 | PACK | Freq: Once | INTRAVENOUS | Status: AC | PRN
  Administered 2020-07-05: 31.2 via INTRAVENOUS

## 2020-07-05 MED ORDER — SODIUM CHLORIDE FLUSH 0.9 % IV SOLN
INTRAVENOUS | Status: AC
  Administered 2020-07-05: 10 mL via INTRAVENOUS
  Filled 2020-07-05: qty 10

## 2020-07-05 MED ORDER — REGADENOSON 0.4 MG/5ML IV SOLN
INTRAVENOUS | Status: AC
  Administered 2020-07-05: 0.4 mg via INTRAVENOUS
  Filled 2020-07-05: qty 5

## 2020-08-24 ENCOUNTER — Ambulatory Visit (INDEPENDENT_AMBULATORY_CARE_PROVIDER_SITE_OTHER): Payer: 59 | Admitting: Cardiology

## 2020-08-24 ENCOUNTER — Other Ambulatory Visit: Payer: Self-pay

## 2020-08-24 ENCOUNTER — Encounter: Payer: Self-pay | Admitting: Cardiology

## 2020-08-24 VITALS — BP 120/66 | HR 59 | Ht 73.5 in | Wt 250.0 lb

## 2020-08-24 DIAGNOSIS — E782 Mixed hyperlipidemia: Secondary | ICD-10-CM

## 2020-08-24 DIAGNOSIS — I25119 Atherosclerotic heart disease of native coronary artery with unspecified angina pectoris: Secondary | ICD-10-CM

## 2020-08-24 NOTE — Progress Notes (Signed)
Clinical Summary [name] Cummings is a 60 y.o.male  seen today for follow up of the following medical problems  History is obtained primarily from interview of his wife. Due to patient's history of anoxic brain injury and severe short term memory deficit  1. CAD - hx of BMS to LAD in 2007, followed by stent thrombosis with resulting VF arrest. LAD was restented at that time. - cath 2010 LAD 50% prox, 30% ISR in mid LAD, distal LAD 70-80%, LCX 40% ostial, 70-80% narrowing proximal portion LCX in the ramus Tarah Buboltz, RCA 70-80% proximal. LVEF 45%, the distal LAD was stented.  - cath Jan 2017 severe ISR of LAD stent, received another DES. Small OM1 subtotally occluded with left to left collaterals - echo Jan 2016 LVEF 60-65% - seen in [facility] 08/2015 with atypical chest pain, no evidence of ACS. Imdur was added.     04/2017 nuclear stress: anterolateral infarct with mild peri-infarct ischemia. Large lateral/inferolateral/inferior/inferioapical defect with moderate ischemia. LVEF 30-44% by nuclear.  - 04/2017 echo LVEF 50-55%, multiple WMAs  -04/2017 cath severe LCX ostial disease, received DES. Moderate ISR of LAD stent. Severe ostial disease of small nondominant RCA.   07/2018 echo LVEF 55-60%, apex hypokinetic -05/2019 nuclear stress high degree of ischemia  -05/2019 cath as reported below, received DES to prox LAD - given stent burden and prior stent thrombosis plan for indefinitie DAPT   05/2020 appt with PA Strader decribed episodes of chest pain  - Jan 2022 lexiscan: Large, moderate intensity, apical to basal anterolateral defect that is partially reversible and consistent with ischemia. - symptoms improved with imdur.   - no recurrent chest pains   2. 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 Patient is not able to give description of symptoms, symptoms are all relayed through his wife.  3. Hyperlipidemia - did not tolerate  crestor or lipitor - has been on pravastatin  - on repatha, just started. Remains on pravastatin as well    SH:  Wife works [employer]. Wifes mother passed Jun 03 2020 with COVID.   Past Medical History:  Diagnosis Date  . Anxiety   . 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  . CAD (coronary artery disease)    a. s/p BMS to LAD in 2007 with stent thrombosis and VF arrest and LAD re-stented b. DES to distal LAD in 2010 c. ISR and DES to LAD in 2017 d. DES to LCx in 04/2017 and severe ostial stenosis of small RCA e. 05/2019: DES to proximal LAD.   Marland Kitchen CHF (congestive heart failure) (HCC)   . Depression   . Dizziness   . Dysphagia, unspecified(787.20)   . GERD (gastroesophageal reflux disease)   . History of seizure disorder   . Hyperlipidemia, mixed   . Hypertension   . Hypertensive heart disease    unspecified  . Ischemic cardiomyopathy    a. 06/2014 Echo: EF 60-65%.  . 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  . Nephrolithiasis   . 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     Allergies  Allergen Reactions  . Sulfamethoxazole-Trimethoprim Rash and Other (See Comments)    Sweet's syndrome   . Crestor [Rosuvastatin Calcium]     Myalgias  . Hydromorphone Hcl Other (See Comments)    Left side went numb  . Lorazepam Other (See Comments)  Hallucinations   . Other Other (See Comments)  . Pseudoephedrine Other (See Comments)    Caused seizure  . Terfenadine Other (See Comments)    Reaction not known  . Atorvastatin Other (See Comments)    Pt c/o myalgias     Current Outpatient Medications  Medication Sig Dispense Refill  . aspirin EC 81 MG tablet Take 81 mg by mouth daily.    . busPIRone (BUSPAR) 15 MG tablet     . carvedilol (COREG) 12.5 MG tablet     . carvedilol (COREG) 25 MG tablet Take 1 tablet (25 mg total) 2 (two) times daily with a meal by mouth. 180 tablet 3  . clopidogrel (PLAVIX) 75  MG tablet Take 1 tablet (75 mg total) daily by mouth. Same day PCI 90 tablet 3  . cyclobenzaprine (FLEXERIL) 10 MG tablet Take 10 mg by mouth at bedtime.    . Evolocumab (REPATHA SURECLICK) 140 MG/ML SOAJ Inject 140 mg into the skin every 14 (fourteen) days. 2 mL 11  . FLUoxetine (PROZAC) 20 MG capsule Take 20 mg by mouth at bedtime.     . fluticasone (FLONASE) 50 MCG/ACT nasal spray Place 1 spray into both nostrils daily as needed for allergies.     Marland Kitchen HYDROcodone-acetaminophen (NORCO/VICODIN) 5-325 MG tablet Take 1 tablet by mouth 4 (four) times daily as needed for moderate pain.     . isosorbide mononitrate (IMDUR) 30 MG 24 hr tablet Take 1 tablet (30 mg total) by mouth daily. 90 tablet 3  . loratadine (CLARITIN) 10 MG tablet Take 10 mg daily as needed by mouth for allergies.     . meloxicam (MOBIC) 7.5 MG tablet Take 7.5 mg by mouth daily.    . nitroGLYCERIN (NITROSTAT) 0.4 MG SL tablet Place 1 tablet (0.4 mg total) every 5 (five) minutes as needed under the tongue for chest pain. 25 tablet 3  . oxybutynin (DITROPAN) 5 MG tablet Take 5 mg by mouth 2 (two) times daily.    . pantoprazole (PROTONIX) 40 MG tablet Take 1 tablet (40 mg total) daily by mouth. 90 tablet 3  . pravastatin (PRAVACHOL) 80 MG tablet Take 1 tablet (80 mg total) by mouth every evening. 90 tablet 3  . VENTOLIN HFA 108 (90 Base) MCG/ACT inhaler Inhale 1-2 puffs into the lungs every 6 (six) hours as needed for wheezing or shortness of breath.     . Vitamin D, Ergocalciferol, (DRISDOL) 1.25 MG (50000 UT) CAPS capsule Take 50,000 Units by mouth every Tuesday.     No current facility-administered medications for this visit.     Past Surgical History:  Procedure Laterality Date  . bedside tracheostomy     with #6 Shiley. 04/14/2006  . CARDIAC CATHETERIZATION  09/2005   with bare metal stent to the mid LAD (Minivision 2.5 x 18 mm) with Provisional balloon angioplasty to the second diagonal.  . CARDIAC CATHETERIZATION  03/2006    with stenting of the mid LAD with Taxus 2.5 x 20 mm and 2.5 x 8 mm stents.      Marland Kitchen CARDIAC CATHETERIZATION N/A 07/20/2015   Procedure: Left Heart Cath and Coronary Angiography;  Surgeon: Corky Crafts, MD;  Location: [facility] INVASIVE CV LAB;  Service: Cardiovascular;  Laterality: N/A;  . CARDIAC CATHETERIZATION N/A 07/20/2015   Procedure: Coronary Stent Intervention;  Surgeon: Corky Crafts, MD;  Location: [facility] INVASIVE CV LAB;  Service: Cardiovascular;  Laterality: N/A;  . CORONARY STENT INTERVENTION N/A 05/15/2017   Procedure: CORONARY STENT INTERVENTION;  Surgeon: Tonny Bollman, MD;  Location: [facility] INVASIVE CV LAB;  Service: Cardiovascular;  Laterality: N/A;  . CORONARY STENT INTERVENTION N/A 05/27/2019   Procedure: CORONARY STENT INTERVENTION;  Surgeon: Tonny Bollman, MD;  Location: [facility] INVASIVE CV LAB;  Service: Cardiovascular;  Laterality: N/A;  . CORONARY STENT PLACEMENT  07/20/2015   LAD with DES  . ESOPHAGOGASTRODUODENOSCOPY    . KIDNEY STONE SURGERY    . LEFT HEART CATH AND CORONARY ANGIOGRAPHY N/A 05/15/2017   Procedure: LEFT HEART CATH AND CORONARY ANGIOGRAPHY;  Surgeon: Tonny Bollman, MD;  Location: [facility] INVASIVE CV LAB;  Service: Cardiovascular;  Laterality: N/A;  . LEFT HEART CATH AND CORONARY ANGIOGRAPHY N/A 05/27/2019   Procedure: LEFT HEART CATH AND CORONARY ANGIOGRAPHY;  Surgeon: Tonny Bollman, MD;  Location: [facility] INVASIVE CV LAB;  Service: Cardiovascular;  Laterality: N/A;  . PEG TUBE PLACEMENT    . PEG TUBE REMOVAL    . stent thrombosis  03/2006   Complicated my sudden cardiac death  . TRACHEOSTOMY CLOSURE       Allergies  Allergen Reactions  . Sulfamethoxazole-Trimethoprim Rash and Other (See Comments)    Sweet's syndrome   . Crestor [Rosuvastatin Calcium]     Myalgias  . Hydromorphone Hcl Other (See Comments)    Left side went numb  . Lorazepam Other (See Comments)    Hallucinations   . Other Other (See Comments)  . Pseudoephedrine Other (See Comments)     Caused seizure  . Terfenadine Other (See Comments)    Reaction not known  . Atorvastatin Other (See Comments)    Pt c/o myalgias      Family History  Problem Relation Age of Onset  . Heart attack Mother   . Diabetes Mother   . Hydrocephalus Mother   . Heart attack Father   . Cancer Father        unsure of type  . Diabetes Sister   . Coronary artery disease Neg Hx        Siblings     Social History Mr. Luckman reports that he has never smoked. He quit smokeless tobacco use about 14 years ago.  His smokeless tobacco use included chew. Mr. Burlingame reports no history of alcohol use.   Review of Systems CONSTITUTIONAL: No weight loss, fever, chills, weakness or fatigue.  HEENT: Eyes: No visual loss, blurred vision, double vision or yellow sclerae.No hearing loss, sneezing, congestion, runny nose or sore throat.  SKIN: No rash or itching.  CARDIOVASCULAR: per hpi RESPIRATORY: No shortness of breath, cough or sputum.  GASTROINTESTINAL: No anorexia, nausea, vomiting or diarrhea. No abdominal pain or blood.  GENITOURINARY: No burning on urination, no polyuria NEUROLOGICAL: No headache, dizziness, syncope, paralysis, ataxia, numbness or tingling in the extremities. No change in bowel or bladder control.  MUSCULOSKELETAL: No muscle, back pain, joint pain or stiffness.  LYMPHATICS: No enlarged nodes. No history of splenectomy.  PSYCHIATRIC: No history of depression or anxiety.  ENDOCRINOLOGIC: No reports of sweating, cold or heat intolerance. No polyuria or polydipsia.  Marland Kitchen   Physical Examination Today's Vitals   08/24/20 1548  BP: 120/66  Pulse: (!) 59  SpO2: 97%  Weight: 250 lb (113.4 kg)  Height: 6' 1.5" (1.867 m)   Body mass index is 32.54 kg/m.  Gen: resting comfortably, no acute distress HEENT: no scleral icterus, pupils equal round and reactive, no palptable cervical adenopathy,  CV: RRR, no m/r/g, no jvd Resp: Clear to auscultation bilaterally GI: abdomen is  soft, non-tender, non-distended, normal bowel  sounds, no hepatosplenomegaly MSK: extremities are warm, no edema.  Skin: warm, no rash Neuro:  no focal deficits Psych: appropriate affect   Diagnostic Studies 11/2008 Cath RESULTS: Left main coronary artery: The left main coronary artery was free of significant disease.  Left anterior descending: The left anterior descending artery gave rise to small diagonal Licia Harl and small septal perforator, and larger diagonal Chantay Whitelock. There was 30% narrowing within the midportion of the previously placed stents. Distal to the stent, there was an eccentric lesion, which appeared to be a ruptured plaque, which was estimated to be 70-80% obstructive. There also was 40-50% diffuse narrowing in the proximal LAD.  Circumflex artery: The circumflex artery gave rise to a large ramus Jawon Dipiero and two posterolateral branches and a posterior descending Phyllip Claw. This was a dominant vessel. There was 70-80% narrowing in the proximal portion in the ramus Cecelia Graciano which was extended over about 18 mm. There was also a 40% ostial narrowing in the circumflex artery and 50% narrowing in the ostium of the ramus Rajean Desantiago.  Right coronary artery: The right coronary artery was a nondominant vessel which supplied only right ventricular branches. There was a 70- 80% proximal stenosis.  Left ventriculogram: The left ventriculogram was performed in the RAO projection showed hypokinesis of the anterolateral wall and apex. The estimated fraction was 45%.  Following stenting of the lesion in the mid LAD, the stenosis improved from 80% to 0%.  CONCLUSIONS: 1. Coronary artery disease, status post previous percutaneous coronary  interventions as described above with 40% narrowing in the proximal  LAD, 30% narrowing within the stent in the mid LAD, and 80%  narrowing in the mid LAD after the stent, 70-80% narrowing in  the  large ramus Kassidie Hendriks of the circumflex artery with 40% ostial  stenosis in the dominant circumflex artery, and 80% narrowing in  the proximal portion of a nondominant right coronary artery with  anterolateral wall and apical wall hypokinesis and estimated  ejection fraction of 45%. 2. Successful PCI of the lesion in the mid LAD distal to the  previously placed stent using a XIENCE drug-eluting stent with  improvement in center narrowing from 80% to 0%.  [facility]    Jan 2016 echo Study Conclusions  - Left ventricle: The cavity size was normal. Wall thickness was normal. Systolic function was normal. The estimated ejection fraction was in the range of 60% to 65%. Indeterminant diastolic function. - Aortic valve: Mildly calcified annulus. Mildly thickened leaflets. - Mitral valve: Mildly calcified annulus. Mildly thickened leaflets . - Left atrium: The atrium was mildly dilated. - Systemic veins: IVC is small, suggestive of low RA pressure and hypovolemia. - Technically adequate study.   07/10/15 Clinic EKG (performed and reviewed in clinic): Sinus bradycardia  Jan 2017 Lexiscan MPI  Perfusion Summary Defect 1:  There is a large defect of moderate severity present in the basal anterolateral, mid anterolateral and apical lateral location. The defect is partially reversible. Large, moderate intensity, partially reversible anterolateral defect consistent with scar and at least moderate peri-infarct ischemia.    Overall Study Impression Myocardial perfusion is abnormal. This is a high risk study. Overall left ventricular systolic function was abnormal. Nuclear stress EF: 43%.      Jan 2017 cath  OM1 subtotally occluded with left to left collaterals coming from the apical LAD.  Severe in-stent restenosis of prior bare-metal stent which was placed in  2007 in the setting of his cardiac  arrest. This was treated with cutting balloon angioplasty anddrug-eluting stent placement with a 2.5 x 28 Synergy drug-eluting stent, postdilatto greater than 3 m  Continue dual antiplatelet therapy indefinitely. The patient has significant memory loss after his cardiac arrest. Will discuss with his wife about the importance of staying on his dual antiplatelet therapy.   He'll be watched overnight with anticipated discharge tomorrow  04/2017 Nuclear stress  There was no ST segment deviation noted during stress.  Findings consistent with prior anterolateral myocardial infarction with mild peri-infarct ischemia. Large lateral/inferolatera/inferior/inferoapical defect with moderate ischemia  This is a high risk study. High risk due to low ejection fraction and multiple areas of ischemia. Mild area of ischemia anterolateral. Moderate area of ischemia lateral/inferior walls  The left ventricular ejection fraction is moderately decreased (30-44%).   04/2017 cath 1. Severe ostial left circumflex stenosis treated successfully with drug-eluting stent implantation (3.5 x 28 mm Sierra DES postdilated with a 4.0 mm noncompliant balloon) 2. Moderate diffuse in-stent restenosis of the LAD 3. Severe stenosis of the RCA ostium (nondominant vessel) 4. Normal LVEDP  Continue long-term dual antiplatelet therapy with aspirin and clopidogrel. As long as no complications arise patient is eligible for same day discharge.  04/2017 echo Study Conclusions  - Left ventricle: The cavity size was normal. Wall thickness was increased in a pattern of mild LVH. Systolic function was normal. The estimated ejection fraction was in the range of 50% to 55%. Left ventricular diastolic function parameters were normal. - Regional wall motion abnormality: Hypokinesis of the apical anterior, apical inferior, and apical myocardium. - Aortic valve: Valve area (VTI):  3.01 cm^2. Valve area (Vmax): 3.29 cm^2. - Technically difficult study.  07/2018 echo IMPRESSIONS   1. The left ventricle has normal systolic function, with an ejection fraction of 55-60%. The cavity size was normal. There is mildly increased left ventricular wall thickness. Left ventricular diastolic parameters were normal.The apex is hypokinetic 2. The right ventricle has normal systolic function. The cavity was normal. There is no increase in right ventricular wall thickness. 3. Left atrial size was mildly dilated. 4. Small pericardial effusion. 5. The mitral valve is normal in structure. No evidence of mitral valve stenosis. 6. The tricuspid valve is normal in structure. 7. The aortic valve is tricuspid no stenosis of the aortic valve. 8. The aortic root is normal in size and structure. 9. Pulmonary hypertension is indeterminant, inadequate TR jet. 10. Right atrial pressure is estimated at 3 mmHg.  05/2019 nuclear stress  There was no ST segment deviation noted during stress.  Findings consistent with prior anterolateral/apical myocardial infarction with high level of peri-infarct ischemia.  The left ventricular ejection fraction is normal (55-65%).  High risk study based on degree of current ischemia.    05/2019 cath Severe calcific proximal LAD stenosis and moderate stenosis at the edge of the previously implanted mid LAD stent 2. Continued patency of the stented segment in the proximal circumflex 3. Chronic subtotal occlusion of the first OM Rayjon Wery of the circumflex, unchanged from previous studies 4. Chronic occlusion of the diagonal Brayon Bielefeld of the LAD collateralized from the apical LAD, also unchanged from previous studies 5. Normal LVEDP 6. Successful PCI of the proximal LAD using a 2.75x26 mm Orsiro DES  Recommendations: Continue long-term dual antiplatelet therapy with aspirin and clopidogrel, aggressive medical therapy, candidate for same-day  PCI discharge as long as no early complications arise.      Assessment and Plan  1. CAD/Chest pain/SOB - evaluation has always  been complicated by his severe memory deficit as well as chronic left sided chest pain combined with his extensive prior history of CAD -most recently intermediate risk stress test, symptosm have resolved on imdur - continue medical therapy, if progression of symptoms consider cath.   2. Hyperlipidemia - difficulties tolerating crestor and lipitor, on pravatstatin - just started repatha, continue along with statin at this time.     Antoine Poche, M.D.

## 2020-08-24 NOTE — Patient Instructions (Signed)
Your physician recommends that you schedule a follow-up appointment in: 6 MONTHS WITH DR BRANCH  Your physician recommends that you continue on your current medications as directed. Please refer to the Current Medication list given to you today.  Thank you for choosing [facility]!!    

## 2020-09-11 ENCOUNTER — Other Ambulatory Visit: Payer: Self-pay | Admitting: *Deleted

## 2020-09-11 MED ORDER — ISOSORBIDE MONONITRATE ER 30 MG PO TB24: 30.0000 mg | ORAL_TABLET | Freq: Every day | ORAL | 3 refills | Status: DC

## 2020-10-23 ENCOUNTER — Emergency Department (HOSPITAL_COMMUNITY): Payer: 59

## 2020-10-23 ENCOUNTER — Telehealth: Payer: Self-pay

## 2020-10-23 ENCOUNTER — Emergency Department (HOSPITAL_COMMUNITY)
Admission: EM | Admit: 2020-10-23 | Discharge: 2020-10-23 | Disposition: A | Discharge status: Home / Self Care | Payer: 59 | Attending: Emergency Medicine | Admitting: Emergency Medicine

## 2020-10-23 ENCOUNTER — Other Ambulatory Visit: Payer: Self-pay

## 2020-10-23 DIAGNOSIS — S90812A Abrasion, left foot, initial encounter: Secondary | ICD-10-CM | POA: Diagnosis not present

## 2020-10-23 DIAGNOSIS — I2510 Atherosclerotic heart disease of native coronary artery without angina pectoris: Secondary | ICD-10-CM | POA: Diagnosis not present

## 2020-10-23 DIAGNOSIS — Z79899 Other long term (current) drug therapy: Secondary | ICD-10-CM | POA: Insufficient documentation

## 2020-10-23 DIAGNOSIS — M25531 Pain in right wrist: Secondary | ICD-10-CM | POA: Diagnosis not present

## 2020-10-23 DIAGNOSIS — Z7902 Long term (current) use of antithrombotics/antiplatelets: Secondary | ICD-10-CM | POA: Diagnosis not present

## 2020-10-23 DIAGNOSIS — I509 Heart failure, unspecified: Secondary | ICD-10-CM | POA: Diagnosis not present

## 2020-10-23 DIAGNOSIS — I110 Hypertensive heart disease with heart failure: Secondary | ICD-10-CM | POA: Diagnosis not present

## 2020-10-23 DIAGNOSIS — S0990XA Unspecified injury of head, initial encounter: Secondary | ICD-10-CM | POA: Diagnosis not present

## 2020-10-23 DIAGNOSIS — R52 Pain, unspecified: Secondary | ICD-10-CM | POA: Diagnosis not present

## 2020-10-23 DIAGNOSIS — S60511A Abrasion of right hand, initial encounter: Secondary | ICD-10-CM | POA: Insufficient documentation

## 2020-10-23 DIAGNOSIS — W109XXA Fall (on) (from) unspecified stairs and steps, initial encounter: Secondary | ICD-10-CM | POA: Insufficient documentation

## 2020-10-23 DIAGNOSIS — S99922A Unspecified injury of left foot, initial encounter: Secondary | ICD-10-CM | POA: Diagnosis present

## 2020-10-23 DIAGNOSIS — S62171A Displaced fracture of trapezium [larger multangular], right wrist, initial encounter for closed fracture: Secondary | ICD-10-CM

## 2020-10-23 DIAGNOSIS — Z7982 Long term (current) use of aspirin: Secondary | ICD-10-CM | POA: Diagnosis not present

## 2020-10-23 DIAGNOSIS — N400 Enlarged prostate without lower urinary tract symptoms: Secondary | ICD-10-CM | POA: Insufficient documentation

## 2020-10-23 DIAGNOSIS — S0592XA Unspecified injury of left eye and orbit, initial encounter: Secondary | ICD-10-CM | POA: Diagnosis not present

## 2020-10-23 DIAGNOSIS — R4182 Altered mental status, unspecified: Secondary | ICD-10-CM | POA: Diagnosis not present

## 2020-10-23 DIAGNOSIS — W19XXXA Unspecified fall, initial encounter: Secondary | ICD-10-CM

## 2020-10-23 LAB — I-STAT CHEM 8, ED
BUN: 13 mg/dL (ref 6–20)
Calcium, Ion: 1.14 mmol/L — ABNORMAL LOW (ref 1.15–1.40)
Chloride: 104 mmol/L (ref 98–111)
Creatinine, Ser: 1 mg/dL (ref 0.61–1.24)
Glucose, Bld: 118 mg/dL — ABNORMAL HIGH (ref 70–99)
HCT: 39 % (ref 39.0–52.0)
Hemoglobin: 13.3 g/dL (ref 13.0–17.0)
Potassium: 3.9 mmol/L (ref 3.5–5.1)
Sodium: 139 mmol/L (ref 135–145)
TCO2: 25 mmol/L (ref 22–32)

## 2020-10-23 LAB — CBC
HCT: 40.1 % (ref 39.0–52.0)
Hemoglobin: 13.2 g/dL (ref 13.0–17.0)
MCH: 28.9 pg (ref 26.0–34.0)
MCHC: 32.9 g/dL (ref 30.0–36.0)
MCV: 87.7 fL (ref 80.0–100.0)
Platelets: 220 10*3/uL (ref 150–400)
RBC: 4.57 MIL/uL (ref 4.22–5.81)
RDW: 12.1 % (ref 11.5–15.5)
WBC: 12.7 10*3/uL — ABNORMAL HIGH (ref 4.0–10.5)
nRBC: 0 % (ref 0.0–0.2)

## 2020-10-23 LAB — COMPREHENSIVE METABOLIC PANEL
ALT: 16 U/L (ref 0–44)
AST: 22 U/L (ref 15–41)
Albumin: 3.9 g/dL (ref 3.5–5.0)
Alkaline Phosphatase: 134 U/L — ABNORMAL HIGH (ref 38–126)
Anion gap: 7 (ref 5–15)
BUN: 12 mg/dL (ref 6–20)
CO2: 26 mmol/L (ref 22–32)
Calcium: 8.9 mg/dL (ref 8.9–10.3)
Chloride: 105 mmol/L (ref 98–111)
Creatinine, Ser: 1.04 mg/dL (ref 0.61–1.24)
GFR, Estimated: >60 mL/min (ref >60)
Glucose, Bld: 120 mg/dL — ABNORMAL HIGH (ref 70–99)
Potassium: 3.9 mmol/L (ref 3.5–5.1)
Sodium: 138 mmol/L (ref 135–145)
Total Bilirubin: 0.8 mg/dL (ref 0.3–1.2)
Total Protein: 6.2 g/dL — ABNORMAL LOW (ref 6.5–8.1)

## 2020-10-23 LAB — PROTIME-INR
INR: 1 (ref 0.8–1.2)
Prothrombin Time: 12.9 seconds (ref 11.4–15.2)

## 2020-10-23 LAB — ETHANOL: Alcohol, Ethyl (B): <10 mg/dL (ref <10)

## 2020-10-23 LAB — LACTIC ACID, PLASMA: Lactic Acid, Venous: 1.5 mmol/L (ref 0.5–1.9)

## 2020-10-23 MED ORDER — IOHEXOL 300 MG/ML  SOLN
100.0000 mL | Freq: Once | INTRAMUSCULAR | Status: AC | PRN
  Administered 2020-10-23: 100 mL via INTRAVENOUS

## 2020-10-23 NOTE — ED Notes (Signed)
Per Myrtis Ser, MD activate Level 2 Trauma.

## 2020-10-23 NOTE — Discharge Instructions (Addendum)
You can take 600 mg of ibuprofen every 6 hours, you can take 1000 mg of Tylenol every 6 hours, you can alternate these every 3 or you can take them together.  

## 2020-10-23 NOTE — Telephone Encounter (Signed)
Prior Authorization approved for [employer] medication  PA Case: [id number], Status: Approved, Coverage Starts on: 06/30/2020 12:00:00 AM, Coverage Ends on: 06/29/2021 12:00:00 AM.

## 2020-10-23 NOTE — ED Triage Notes (Signed)
Pt arrives to ED BIB EMS for a fall. Pt had an unwitnessed and is unknown how long pt was down. Pt is confused only oriented to self. Pt has lacerations to LF foot and head, Pt c/o Lf side Rib pain. Pt is on plavix. Pt arrived wearng C-Collar Hx of Anoxic Brain Injury   BP 125/80  HR 68 O2 96% CBG 115

## 2020-10-23 NOTE — ED Provider Notes (Signed)
[facility] EMERGENCY DEPARTMENT Provider Note   CSN: [id number] Arrival date & time: 10/23/20  1808     History No chief complaint on file.   [name] Cummings is a 60 y.o. male presenting for evaluation after a fall.  Level 5 caveat due to altered mental status.  Per EMS, patient had an unwitnessed fall where he fell down several steps, approximately 2 to 3 feet.  He is on Plavix.  Patient was seen approximately 20 minutes earlier, so has not laying down for a long time.  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  Family states patient is at baseline mental status.    Patient is complaining of generalized pain, mostly in the left side.  Unable to provide significant further history  HPI     Past Medical History:  Diagnosis Date  . Anxiety   . 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  . CAD (coronary artery disease)    a. s/p BMS to LAD in 2007 with stent thrombosis and VF arrest and LAD re-stented b. DES to distal LAD in 2010 c. ISR and DES to LAD in 2017 d. DES to LCx in 04/2017 and severe ostial stenosis of small RCA e. 05/2019: DES to proximal LAD.   Marland Kitchen CHF (congestive heart failure) (HCC)   . Depression   . Dizziness   . Dysphagia, unspecified(787.20)   . GERD (gastroesophageal reflux disease)   . History of seizure disorder   . Hyperlipidemia, mixed   . Hypertension   . Hypertensive heart disease    unspecified  . Ischemic cardiomyopathy    a. 06/2014 Echo: EF 60-65%.  . 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  . Nephrolithiasis   . 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    Patient Active Problem List   Diagnosis Date Noted  . 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 Rash 08/28/2018  . Stevens-Johnson syndrome (HCC) 08/28/2018  . Demand ischemia (HCC) 08/28/2018  . Pain in thoracic spine 04/14/2018  . Dizziness 01/25/2018  . Excessive sleepiness  01/25/2018  . Imbalance 06/09/2017  . Impacted cerumen of right ear 06/09/2017  . Tinnitus of right ear 06/09/2017  . Abnormal nuclear cardiac imaging test 05/15/2017  . Physical exam, annual 09/05/2015  . Numbness and tingling of left side of face 08/16/2015  . Left sided numbness 08/15/2015  . Anxiety 07/21/2015  . Unstable angina (HCC) 07/21/2015  . Hypertensive heart disease   . Hyperlipidemia, mixed   . CAD (coronary artery disease)   . Brain anoxic injury (HCC)   . Angina pectoris (HCC)   . 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 CAD, NATIVE VESSEL 07/16/2009  . 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 Hyperlipidemia LDL goal <70 11/10/2008  . Anxiety state 11/10/2008  . Depression 11/10/2008  . Essential hypertension 11/10/2008  . CARDIOMYOPATHY, ISCHEMIC 11/10/2008  . VENTRICULAR FIBRILLATION 11/10/2008  . DYSPHAGIA UNSPECIFIED 11/10/2008  . SEIZURE DISORDER, HX OF 11/10/2008  . Dysphagia 11/10/2008    Past Surgical History:  Procedure Laterality Date  . bedside tracheostomy     with #6 Shiley. 04/14/2006  . CARDIAC CATHETERIZATION  09/2005   with bare metal stent to the mid LAD (Minivision 2.5 x 18 mm) with Provisional balloon angioplasty to the second diagonal.  . CARDIAC CATHETERIZATION  03/2006   with stenting of the mid  LAD with Taxus 2.5 x 20 mm and 2.5 x 8 mm stents.      Marland Kitchen CARDIAC CATHETERIZATION N/A 07/20/2015   Procedure: Left Heart Cath and Coronary Angiography;  Surgeon: Corky Crafts, MD;  Location: [facility] INVASIVE CV LAB;  Service: Cardiovascular;  Laterality: N/A;  . CARDIAC CATHETERIZATION N/A 07/20/2015   Procedure: Coronary Stent Intervention;  Surgeon: Corky Crafts, MD;  Location: [facility] INVASIVE CV LAB;  Service: Cardiovascular;  Laterality: N/A;  . CORONARY STENT INTERVENTION N/A 05/15/2017   Procedure: CORONARY STENT INTERVENTION;  Surgeon: Tonny Bollman, MD;  Location: [facility] INVASIVE CV LAB;  Service: Cardiovascular;  Laterality: N/A;  .  CORONARY STENT INTERVENTION N/A 05/27/2019   Procedure: CORONARY STENT INTERVENTION;  Surgeon: Tonny Bollman, MD;  Location: [facility] INVASIVE CV LAB;  Service: Cardiovascular;  Laterality: N/A;  . CORONARY STENT PLACEMENT  07/20/2015   LAD with DES  . ESOPHAGOGASTRODUODENOSCOPY    . KIDNEY STONE SURGERY    . LEFT HEART CATH AND CORONARY ANGIOGRAPHY N/A 05/15/2017   Procedure: LEFT HEART CATH AND CORONARY ANGIOGRAPHY;  Surgeon: Tonny Bollman, MD;  Location: [facility] INVASIVE CV LAB;  Service: Cardiovascular;  Laterality: N/A;  . LEFT HEART CATH AND CORONARY ANGIOGRAPHY N/A 05/27/2019   Procedure: LEFT HEART CATH AND CORONARY ANGIOGRAPHY;  Surgeon: Tonny Bollman, MD;  Location: [facility] INVASIVE CV LAB;  Service: Cardiovascular;  Laterality: N/A;  . PEG TUBE PLACEMENT    . PEG TUBE REMOVAL    . stent thrombosis  03/2006   Complicated my sudden cardiac death  . TRACHEOSTOMY CLOSURE         Family History  Problem Relation Age of Onset  . Heart attack Mother   . Diabetes Mother   . Hydrocephalus Mother   . Heart attack Father   . Cancer Father        unsure of type  . Diabetes Sister   . Coronary artery disease Neg Hx        Siblings    Social History   Tobacco Use  . Smoking status: Never Smoker  . Smokeless tobacco: Former Neurosurgeon    Types: Engineer, drilling  . Vaping Use: Never used  Substance Use Topics  . Alcohol use: No    Alcohol/week: 0.0 standard drinks  . Drug use: No    Home Medications Prior to Admission medications   Medication Sig Start Date End Date Taking? Authorizing Provider  aspirin EC 81 MG tablet Take 81 mg by mouth daily.    [provider]  busPIRone (BUSPAR) 15 MG tablet  01/29/20   [provider]  carvedilol (COREG) 25 MG tablet Take 1 tablet (25 mg total) 2 (two) times daily with a meal by mouth. 05/15/17   Bhagat, Bhavinkumar, PA  clopidogrel (PLAVIX) 75 MG tablet Take 1 tablet (75 mg total) daily by mouth. Same day PCI 05/15/17   Manson Passey, PA  cyclobenzaprine (FLEXERIL) 10 MG tablet Take 10 mg by mouth at bedtime.    [provider]  Evolocumab (REPATHA SURECLICK) 140 MG/ML SOAJ Inject 140 mg into the skin every 14 (fourteen) days. 05/23/20   Antoine Poche, MD  FLUoxetine (PROZAC) 20 MG capsule Take 20 mg by mouth at bedtime.  04/20/14   [provider]  fluticasone (FLONASE) 50 MCG/ACT nasal spray Place 1 spray into both nostrils daily as needed for allergies.  04/20/14   [provider]  HYDROcodone-acetaminophen (NORCO/VICODIN) 5-325 MG tablet Take 1 tablet by mouth 4 (four)  times daily as needed for moderate pain.  07/13/18   [provider]  isosorbide mononitrate (IMDUR) 30 MG 24 hr tablet Take 1 tablet (30 mg total) by mouth daily. 09/11/20 12/10/20  Antoine PocheBranch, Jonathan F, MD  loratadine (CLARITIN) 10 MG tablet Take 10 mg daily as needed by mouth for allergies.     [provider]  meloxicam (MOBIC) 7.5 MG tablet Take 7.5 mg by mouth daily.    [provider]  nitroGLYCERIN (NITROSTAT) 0.4 MG SL tablet Place 1 tablet (0.4 mg total) every 5 (five) minutes as needed under the tongue for chest pain. 05/15/17   Bhagat, Sharrell KuBhavinkumar, PA  oxybutynin (DITROPAN) 5 MG tablet Take 5 mg by mouth 2 (two) times daily.    [provider]  pantoprazole (PROTONIX) 40 MG tablet Take 1 tablet (40 mg total) daily by mouth. 05/15/17   Bhagat, Bhavinkumar, PA  pravastatin (PRAVACHOL) 80 MG tablet Take 1 tablet (80 mg total) by mouth every evening. 06/27/20 09/25/20  Antoine PocheBranch, Jonathan F, MD  VENTOLIN HFA 108 [phone](90 Base) MCG/ACT inhaler Inhale 1-2 puffs into the lungs every 6 (six) hours as needed for wheezing or shortness of breath.  07/25/18   [provider]  Vitamin D, Ergocalciferol, (DRISDOL) 1.25 MG (50000 UT) CAPS capsule Take 50,000 Units by mouth every Tuesday.    [provider]    Allergies    Sulfamethoxazole-trimethoprim, Crestor [rosuvastatin  calcium], Hydromorphone hcl, Lorazepam, Other, Pseudoephedrine, Terfenadine, and Atorvastatin  Review of Systems   Review of Systems  Unable to perform ROS: Mental status change  Musculoskeletal: Positive for arthralgias and myalgias.  Hematological: Bruises/bleeds easily.    Physical Exam Updated Vital Signs BP 133/77   Pulse 66   Temp 98.9 F (37.2 C) (Oral)   Resp (!) 21   SpO2 98%   Physical Exam Vitals and nursing note reviewed. Exam conducted with a chaperone present.  Constitutional:      General: He is not in acute distress.    Appearance: He is well-developed.  HENT:     Head: Normocephalic.      Comments: Trauma noted around the left eye/forehead Eyes:     Extraocular Movements: Extraocular movements intact.     Conjunctiva/sclera: Conjunctivae normal.     Pupils: Pupils are equal, round, and reactive to light.  Neck:     Comments: In c-collar. No ttp of midline c-spine Cardiovascular:     Rate and Rhythm: Normal rate and regular rhythm.     Pulses: Normal pulses.  Pulmonary:     Effort: Pulmonary effort is normal. No respiratory distress.     Breath sounds: Normal breath sounds. No wheezing.     Comments: Clear lung sounds. Speaking in full sentences. No crepitus Abdominal:     General: There is no distension.     Palpations: Abdomen is soft. There is no mass.     Tenderness: There is no abdominal tenderness. There is no guarding or rebound.     Comments: No focal ttp  Genitourinary:    Rectum: Normal.  Musculoskeletal:        General: Tenderness present.     Comments: Patient reporting pain of the entire left side, however inconsistent exam.  No clear focal tenderness of the back or midline spine. Patellar reflexes equal bilaterally.  Abrasion of the left foot and the right hand.  No deformity or injury noted elsewhere on the extremities  Skin:    General: Skin is warm and dry.  Capillary Refill: Capillary refill takes less than 2 seconds.   Neurological:     Comments: Alert to person.  Repeatedly asking the same questions.     ED Results / Procedures / Treatments   Labs (all labs ordered are listed, but only abnormal results are displayed) Labs Reviewed  COMPREHENSIVE METABOLIC PANEL  CBC  ETHANOL  URINALYSIS, ROUTINE W REFLEX MICROSCOPIC  LACTIC ACID, PLASMA  PROTIME-INR  RAPID URINE DRUG SCREEN, HOSP PERFORMED  I-STAT CHEM 8, ED    EKG None  Radiology No results found.  Procedures Procedures   Medications Ordered in ED Medications - No data to display  ED Course  I have reviewed the triage vital signs and the nursing notes.  Pertinent labs & imaging results that were available during my care of the patient were reviewed by me and considered in my medical decision making (see chart for details).    MDM Rules/Calculators/A&P                          Patient presented for evaluation after an unwitnessed fall.  On exam, patient does not appear in acute distress.  He is confused and asking same questions, though per family that is baseline.  He is reporting generalized pain, without focal findings noted on exam.  In the setting of altered mental status however, will obtain imaging to ensure no bleed, fracture, dislocation, or signs of intra-abdominal/intrathoracic injury.  As patient is on Plavix with signs of head injury, level 2 trauma activated. Pt seen in conjunction with attending, Dr. Myrtis Ser.   Pt signed out to Dr. Myrtis Ser for reevaluation and f/u on imaging/labs.   Final Clinical Impression(s) / ED Diagnoses Final diagnoses:  Fall  Fall  Fall    Rx / DC Orders ED Discharge Orders    None       Alveria Apley, [place] 10/23/20 1903    Sabino Donovan, MD 10/23/20 2206

## 2020-10-23 NOTE — ED Notes (Signed)
All appropriate discharge materials reviewed at length with patient. Time for questions provided. Pt has no other questions at this time and verbalizes understanding of all provided materials.  

## 2020-10-23 NOTE — Progress Notes (Signed)
Orthopedic Tech Progress Note Patient Details:  [name] Cummings 02-28-1961 [id number]  Ortho Devices Type of Ortho Device: Thumb spica splint Splint Material: Fiberglass Ortho Device/Splint Location: Right Upper Extremity Ortho Device/Splint Interventions: Ordered,Application   Post Interventions Patient Tolerated: Well Instructions Provided: Care of device,Poper ambulation with device   [name] Cummings 10/23/2020, 10:25 PM

## 2020-11-14 ENCOUNTER — Ambulatory Visit: Payer: No Typology Code available for payment source | Admitting: Cardiology

## 2021-03-14 ENCOUNTER — Encounter: Payer: Self-pay | Admitting: Cardiology

## 2021-03-14 ENCOUNTER — Ambulatory Visit (INDEPENDENT_AMBULATORY_CARE_PROVIDER_SITE_OTHER): Payer: 59 | Admitting: Cardiology

## 2021-03-14 VITALS — BP 114/70 | HR 53 | Ht 73.0 in | Wt 245.8 lb

## 2021-03-14 DIAGNOSIS — E782 Mixed hyperlipidemia: Secondary | ICD-10-CM | POA: Diagnosis not present

## 2021-03-14 DIAGNOSIS — I2510 Atherosclerotic heart disease of native coronary artery without angina pectoris: Secondary | ICD-10-CM

## 2021-03-14 NOTE — Progress Notes (Signed)
Clinical Summary Mr. Moten is a 60 y.o.male seen today for follow up of the following medical problems   History is obtained primarily from interview of his wife. Due to patient's history of anoxic brain injury and severe short term memory deficit   1. CAD - hx of BMS to LAD in 2007, followed by stent thrombosis with resulting VF arrest. LAD was restented at that time. - cath 2010 LAD 50% prox, 30% ISR in mid LAD, distal LAD 70-80%, LCX 40% ostial, 70-80% narrowing proximal portion LCX in the ramus Trever Streater, RCA 70-80% proximal. LVEF 45%, the distal LAD was stented.   - cath Jan 2017 severe ISR of LAD stent, received another DES. Small OM1 subtotally occluded with left to left collaterals - echo Jan 2016 LVEF 60-65% - seen in [facility] 08/2015 with atypical chest pain, no evidence of ACS. Imdur was added.        04/2017 nuclear stress: anterolateral infarct with mild peri-infarct ischemia. Large lateral/inferolateral/inferior/inferioapical defect with moderate ischemia. LVEF 30-44% by nuclear.  - 04/2017 echo LVEF 50-55%, multiple WMAs   -04/2017 cath severe LCX ostial disease, received DES. Moderate ISR of LAD stent. Severe ostial disease of small nondominant RCA.      07/2018 echo LVEF 55-60%, apex hypokinetic - 05/2019 nuclear stress high degree of ischemia   - 05/2019 cath as reported below, received DES to prox LAD - given stent burden and prior stent thrombosis plan for indefinitie DAPT      Jan 2022 lexiscan: Large, moderate intensity, apical to basal anterolateral defect that is partially reversible and consistent with ischemia.  - no recent chest pains. No SOB/DOE - stopped isosorbide due to dizziness.       2. 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 Patient is not able to give description of symptoms, symptoms are all relayed through his wife.    3. Hyperlipidemia - did not tolerate crestor or lipitor - has been on pravastatin      - on repatha, just started. Remains on pravastatin as well    10/2020 TC 115 TG 90 HDL 42 LDL 115     SH:  Wife works [employer]. Wifes mother passed Jun 03 2020 with COVID.    Past Medical History:  Diagnosis Date   Anxiety    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   CAD (coronary artery disease)    a. s/p BMS to LAD in 2007 with stent thrombosis and VF arrest and LAD re-stented b. DES to distal LAD in 2010 c. ISR and DES to LAD in 2017 d. DES to LCx in 04/2017 and severe ostial stenosis of small RCA e. 05/2019: DES to proximal LAD.    CHF (congestive heart failure) (HCC)    Depression    Dizziness    Dysphagia, unspecified(787.20)    GERD (gastroesophageal reflux disease)    History of seizure disorder    Hyperlipidemia, mixed    Hypertension    Hypertensive heart disease    unspecified   Ischemic cardiomyopathy    a. 06/2014 Echo: EF 60-65%.   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     Allergies  Allergen Reactions   Sulfamethoxazole-Trimethoprim Rash and Other (See Comments)    Sweet's syndrome (Sweet's syndrome is an  uncommon skin condition marked by a distinctive eruption of tiny bumps that enlarge and are often tender to the touch.)   Crestor [Rosuvastatin Calcium] Other (See Comments)    Myalgias   Hydromorphone Hcl Other (See Comments)    Left side went numb   Lorazepam Other (See Comments)    Hallucinations    Pseudoephedrine Other (See Comments)    Caused a seizure   Terfenadine Other (See Comments)    Reaction not known   Atorvastatin Other (See Comments)    Muscle pain   Biaxin [Clarithromycin] Rash    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     busPIRone (BUSPAR) 15 MG tablet Take 15 mg by mouth 3 (three) times daily.     carvedilol (COREG) 25 MG tablet Take 1 tablet (25 mg total) 2 (two) times daily with a meal by mouth. 180 tablet 3   clopidogrel (PLAVIX) 75 MG tablet Take 1 tablet (75 mg total) daily by mouth. Same day PCI (Patient taking differently: Take 75 mg by mouth daily.) 90 tablet 3   cyclobenzaprine (FLEXERIL) 10 MG tablet Take 10 mg by mouth daily as needed for muscle spasms.     Evolocumab (REPATHA SURECLICK) 140 MG/ML SOAJ Inject 140 mg into the skin every 14 (fourteen) days. 2 mL 11   FLUoxetine (PROZAC) 20 MG capsule Take 20 mg by mouth in the morning.     fluticasone (FLONASE) 50 MCG/ACT nasal spray Place 1 spray into both nostrils daily as needed for allergies.      HYDROcodone-acetaminophen (NORCO) 7.5-325 MG tablet Take 1 tablet by mouth See admin instructions. Take 1 tablet by mouth at 9 AM, 12 NOON, 5 PM, and 10 PM     HYDROcodone-acetaminophen (NORCO/VICODIN) 5-325 MG tablet Take 1 tablet by mouth 4 (four) times daily as needed for moderate pain.  (Patient not taking: Reported on 10/23/2020)     isosorbide mononitrate (IMDUR) 30 MG 24 hr tablet Take 1 tablet (30 mg total) by mouth daily. 90 tablet 3   loratadine (CLARITIN) 10 MG tablet Take 10 mg by mouth in the morning.     meloxicam (MOBIC) 7.5 MG tablet Take 7.5 mg by mouth daily.     nitroGLYCERIN (NITROSTAT) 0.4 MG SL tablet Place 1 tablet (0.4 mg total) every 5 (five) minutes as needed under the tongue for chest pain. 25 tablet 3   oxybutynin (DITROPAN) 5 MG tablet Take 5 mg by mouth in the morning and at bedtime.     pantoprazole (PROTONIX) 40 MG tablet Take 1 tablet (40 mg total) daily by mouth. (Patient taking differently: Take 40 mg by mouth daily before breakfast.) 90 tablet 3   pravastatin (PRAVACHOL) 80 MG tablet Take 1 tablet (80 mg total) by mouth  every evening. 90 tablet 3   VENTOLIN HFA 108 (90 Base) MCG/ACT inhaler Inhale 1-2 puffs into the lungs every 6 (six) hours as needed for wheezing or shortness of breath.      Vitamin D, Ergocalciferol, (DRISDOL) 1.25 MG (50000 UT) CAPS capsule Take 50,000 Units by mouth every Tuesday.     No current facility-administered medications for this visit.     Past Surgical History:  Procedure Laterality Date  bedside tracheostomy     with #6 Shiley. 04/14/2006   CARDIAC CATHETERIZATION  09/2005   with bare metal stent to the mid LAD (Minivision 2.5 x 18 mm) with Provisional balloon angioplasty to the second diagonal.   CARDIAC CATHETERIZATION  03/2006   with stenting of the mid LAD with Taxus 2.5 x 20 mm and 2.5 x 8 mm stents.       CARDIAC CATHETERIZATION N/A 07/20/2015   Procedure: Left Heart Cath and Coronary Angiography;  Surgeon: Corky Crafts, MD;  Location: [facility] INVASIVE CV LAB;  Service: Cardiovascular;  Laterality: N/A;   CARDIAC CATHETERIZATION N/A 07/20/2015   Procedure: Coronary Stent Intervention;  Surgeon: Corky Crafts, MD;  Location: [facility] INVASIVE CV LAB;  Service: Cardiovascular;  Laterality: N/A;   CORONARY STENT INTERVENTION N/A 05/15/2017   Procedure: CORONARY STENT INTERVENTION;  Surgeon: Tonny Bollman, MD;  Location: [facility] INVASIVE CV LAB;  Service: Cardiovascular;  Laterality: N/A;   CORONARY STENT INTERVENTION N/A 05/27/2019   Procedure: CORONARY STENT INTERVENTION;  Surgeon: Tonny Bollman, MD;  Location: [facility] INVASIVE CV LAB;  Service: Cardiovascular;  Laterality: N/A;   CORONARY STENT PLACEMENT  07/20/2015   LAD with DES   ESOPHAGOGASTRODUODENOSCOPY     KIDNEY STONE SURGERY     LEFT HEART CATH AND CORONARY ANGIOGRAPHY N/A 05/15/2017   Procedure: LEFT HEART CATH AND CORONARY ANGIOGRAPHY;  Surgeon: Tonny Bollman, MD;  Location: [facility] INVASIVE CV LAB;  Service: Cardiovascular;  Laterality: N/A;   LEFT HEART CATH AND CORONARY ANGIOGRAPHY N/A 05/27/2019   Procedure:  LEFT HEART CATH AND CORONARY ANGIOGRAPHY;  Surgeon: Tonny Bollman, MD;  Location: [facility] INVASIVE CV LAB;  Service: Cardiovascular;  Laterality: N/A;   PEG TUBE PLACEMENT     PEG TUBE REMOVAL     stent thrombosis  03/2006   Complicated my sudden cardiac death   TRACHEOSTOMY CLOSURE       Allergies  Allergen Reactions   Sulfamethoxazole-Trimethoprim Rash and Other (See Comments)    Sweet's syndrome (Sweet's syndrome is an uncommon skin condition marked by a distinctive eruption of tiny bumps that enlarge and are often tender to the touch.)   Crestor [Rosuvastatin Calcium] Other (See Comments)    Myalgias   Hydromorphone Hcl Other (See Comments)    Left side went numb   Lorazepam Other (See Comments)    Hallucinations    Pseudoephedrine Other (See Comments)    Caused a seizure   Terfenadine Other (See Comments)    Reaction not known   Atorvastatin Other (See Comments)    Muscle pain   Biaxin [Clarithromycin] Rash    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 Yasuda reports that he has never smoked. He quit smokeless tobacco use about 15 years ago.  His smokeless tobacco use included chew. Mr. Fedie reports no history of alcohol use.   Review of Systems CONSTITUTIONAL: No weight loss, fever, chills, weakness or fatigue.  HEENT: Eyes:  No visual loss, blurred vision, double vision or yellow sclerae.No hearing loss, sneezing, congestion, runny nose or sore throat.  SKIN: No rash or itching.  CARDIOVASCULAR: per hpi RESPIRATORY: No shortness of breath, cough or sputum.   GASTROINTESTINAL: No anorexia, nausea, vomiting or diarrhea. No abdominal pain or blood.  GENITOURINARY: No burning on urination, no polyuria NEUROLOGICAL: No headache, dizziness, syncope, paralysis, ataxia, numbness or tingling in the extremities. No change in bowel or bladder control.  MUSCULOSKELETAL: No muscle, back pain, joint pain or stiffness.  LYMPHATICS: No enlarged nodes. No history of splenectomy.  PSYCHIATRIC: No history of depression or anxiety.  ENDOCRINOLOGIC: No reports of sweating, cold or heat intolerance. No polyuria or polydipsia.  Marland Kitchen   Physical Examination Today's Vitals   03/14/21 1603  BP: 114/70  Pulse: (!) 53  SpO2: 96%  Weight: 245 lb 12.8 oz (111.5 kg)  Height: 6\' 1"  (1.854 m)   Body mass index is 32.43 kg/m.  Gen: resting comfortably, no acute distress HEENT: no scleral icterus, pupils equal round and reactive, no palptable cervical adenopathy,  CV: RRR, no m/r/g no jvd Resp: Clear to auscultation bilaterally GI: abdomen is soft, non-tender, non-distended, normal bowel sounds, no hepatosplenomegaly MSK: extremities are warm, no edema.  Skin: warm, no rash Neuro:  no focal deficits Psych: appropriate affect   Diagnostic Studies  11/2008 Cath RESULTS:  Left main coronary artery:  The left main coronary artery was  free of significant disease.    Left anterior descending:  The left anterior descending artery gave rise  to small diagonal Lorretta Kerce and small septal perforator, and larger  diagonal Rockland Kotarski.  There was 30% narrowing within the midportion of the  previously placed stents.  Distal to the stent, there was an eccentric  lesion, which appeared to be a ruptured plaque, which was estimated to  be 70-80% obstructive.  There also was 40-50% diffuse narrowing in the  proximal LAD.    Circumflex artery:  The circumflex artery gave rise to a large ramus  [place] Dube and two posterolateral branches and a posterior descending  Mordche Hedglin.  This was a  dominant vessel.  There was 70-80% narrowing in the  proximal portion in the ramus Oron Westrup which was extended over about 18  mm.  There was also a 40% ostial narrowing in the circumflex artery and  50% narrowing in the ostium of the ramus Neal Trulson.    Right coronary artery:  The right coronary artery was a nondominant  vessel which supplied only right ventricular branches.  There was a 70-  80% proximal stenosis.    Left ventriculogram:  The left ventriculogram was performed in the RAO  projection showed hypokinesis of the anterolateral wall and apex.  The  estimated fraction was 45%.    Following stenting of the lesion in the mid LAD, the stenosis improved  from 80% to 0%.    CONCLUSIONS:  1. Coronary artery disease, status post previous percutaneous coronary      interventions as described above with 40% narrowing in the proximal      LAD, 30% narrowing within the stent in the mid LAD, and 80%      narrowing in the mid LAD after the stent, 70-80% narrowing in the      large ramus Philipp Callegari of the circumflex artery with 40% ostial      stenosis in the dominant circumflex artery, and 80% narrowing in      the proximal portion of a nondominant right coronary artery with  anterolateral wall and apical wall hypokinesis and estimated      ejection fraction of 45%.  2. Successful PCI of the lesion in the mid LAD distal to the      previously placed stent using a XIENCE drug-eluting stent with      improvement in center narrowing from 80% to 0%.    DISPOSITION:  The patient returned to [facility] room for further  observation.       Jan 2016 echo Study Conclusions  - Left ventricle: The cavity size was normal. Wall thickness was   normal. Systolic function was normal. The estimated ejection   fraction was in the range of 60% to 65%. Indeterminant diastolic   function. - Aortic valve: Mildly calcified annulus. Mildly thickened   leaflets. - Mitral valve: Mildly calcified annulus.  Mildly thickened leaflets   . - Left atrium: The atrium was mildly dilated. - Systemic veins: IVC is small, suggestive of low RA pressure and   hypovolemia. - Technically adequate study.     07/10/15 Clinic EKG (performed and reviewed in clinic): Sinus bradycardia   Jan 2017 Lexiscan MPI   Perfusion Summary Defect 1:  There is a large defect of moderate severity present in the basal anterolateral, mid anterolateral and apical lateral location. The defect is partially reversible. Large, moderate intensity, partially reversible anterolateral defect consistent with scar and at least moderate peri-infarct ischemia.      Overall Study Impression Myocardial perfusion is abnormal. This is a high risk study. Overall left ventricular systolic function was abnormal. Nuclear stress EF: 43%.         Jan 2017 cath OM1 subtotally occluded with left to left collaterals coming from the apical LAD. Severe in-stent restenosis of prior bare-metal stent which was placed in 2007 in the setting of his cardiac arrest. This was treated with cutting balloon angioplasty anddrug-eluting stent placement with a 2.5 x 28 Synergy drug-eluting stent, postdilatto greater than 3 m   Continue dual antiplatelet therapy indefinitely. The patient has significant memory loss after his cardiac arrest. Will discuss with his wife about the importance of staying on his dual antiplatelet therapy.    He'll be watched overnight with anticipated discharge tomorrow   04/2017 Nuclear stress There was no ST segment deviation noted during stress. Findings consistent with prior anterolateral myocardial infarction with mild peri-infarct ischemia. Large lateral/inferolatera/inferior/inferoapical defect with moderate ischemia This is a high risk study. High risk due to low ejection fraction and multiple areas of ischemia. Mild area of ischemia anterolateral. Moderate area of ischemia lateral/inferior walls The left ventricular ejection  fraction is moderately decreased (30-44%).     04/2017 cath 1.  Severe ostial left circumflex stenosis treated successfully with drug-eluting stent implantation (3.5 x 28 mm Sierra DES postdilated with a 4.0 mm noncompliant balloon) 2.  Moderate diffuse in-stent restenosis of the LAD 3.  Severe stenosis of the RCA ostium (nondominant vessel) 4.  Normal LVEDP   Continue long-term dual antiplatelet therapy with aspirin and clopidogrel.  As long as no complications arise patient is eligible for same day discharge.   04/2017 echo Study Conclusions   - Left ventricle: The cavity size was normal. Wall thickness was   increased in a pattern of mild LVH. Systolic function was normal.   The estimated ejection fraction was in the range of 50% to 55%.   Left ventricular diastolic function parameters were normal. - Regional wall motion abnormality: Hypokinesis of the apical   anterior, apical inferior, and apical myocardium. -  Aortic valve: Valve area (VTI): 3.01 cm^2. Valve area (Vmax):   3.29 cm^2. - Technically difficult study.   07/2018 echo IMPRESSIONS      1. The left ventricle has normal systolic function, with an ejection fraction of 55-60%. The cavity size was normal. There is mildly increased left ventricular wall thickness. Left ventricular diastolic parameters were normal.The apex is hypokinetic  2. The right ventricle has normal systolic function. The cavity was normal. There is no increase in right ventricular wall thickness.  3. Left atrial size was mildly dilated.  4. Small pericardial effusion.  5. The mitral valve is normal in structure. No evidence of mitral valve stenosis.  6. The tricuspid valve is normal in structure.  7. The aortic valve is tricuspid no stenosis of the aortic valve.  8. The aortic root is normal in size and structure.  9. Pulmonary hypertension is indeterminant, inadequate TR jet. 10. Right atrial pressure is estimated at 3 mmHg.   05/2019 nuclear  stress There was no ST segment deviation noted during stress. Findings consistent with prior anterolateral/apical myocardial infarction with high level of peri-infarct ischemia. The left ventricular ejection fraction is normal (55-65%). High risk study based on degree of current ischemia.       05/2019 cath Severe calcific proximal LAD stenosis and moderate stenosis at the edge of the previously implanted mid LAD stent 2.  Continued patency of the stented segment in the proximal circumflex 3.  Chronic subtotal occlusion of the first OM Anhar Mcdermott of the circumflex, unchanged from previous studies 4.  Chronic occlusion of the diagonal Leila Schuff of the LAD collateralized from the apical LAD, also unchanged from previous studies 5.  Normal LVEDP 6. Successful PCI of the proximal LAD using a 2.75x26 mm Orsiro DES   Recommendations: Continue long-term dual antiplatelet therapy with aspirin and clopidogrel, aggressive medical therapy, candidate for same-day PCI discharge as long as no early complications arise.      Assessment and Plan   1. CAD/Chest pain/SOB - evaluation has always been complicated by his severe memory deficit as well as chronic left sided chest pain combined with his extensive prior history of CAD -no recent symptoms. Off imdur due to headaches. Continue current meds - EKG SR, no ischemic changes    2. Hyperlipidemia - difficulties tolerating crestor and lipitor, on pravatstatin and started on repatha - continue repathat and prasvatatin     Antoine Poche, M.D.

## 2021-03-14 NOTE — Patient Instructions (Addendum)
Medication Instructions:  Continue all current medications.   Labwork: none  Testing/Procedures: none  Follow-Up: 6 months   Any Other Special Instructions Will Be Listed Below (If Applicable).   If you need a refill on your cardiac medications before your next appointment, please call your pharmacy.  

## 2021-06-03 ENCOUNTER — Telehealth: Payer: Self-pay

## 2021-06-03 NOTE — Telephone Encounter (Signed)
approved on [id number] from 2021-06-03 to 2022-04-24 for Repatha Sure-Click auto injectors

## 2021-06-14 ENCOUNTER — Ambulatory Visit: Payer: 59 | Admitting: Cardiology

## 2021-09-20 ENCOUNTER — Ambulatory Visit: Payer: 59 | Admitting: Cardiology

## 2021-10-04 ENCOUNTER — Ambulatory Visit (INDEPENDENT_AMBULATORY_CARE_PROVIDER_SITE_OTHER): Payer: 59 | Admitting: Cardiology

## 2021-10-04 ENCOUNTER — Encounter: Payer: Self-pay | Admitting: Cardiology

## 2021-10-04 VITALS — BP 110/60 | HR 57 | Ht 73.5 in | Wt 245.0 lb

## 2021-10-04 DIAGNOSIS — Z01818 Encounter for other preprocedural examination: Secondary | ICD-10-CM | POA: Diagnosis not present

## 2021-10-04 DIAGNOSIS — I25119 Atherosclerotic heart disease of native coronary artery with unspecified angina pectoris: Secondary | ICD-10-CM | POA: Diagnosis not present

## 2021-10-04 DIAGNOSIS — E782 Mixed hyperlipidemia: Secondary | ICD-10-CM | POA: Diagnosis not present

## 2021-10-04 MED ORDER — EZETIMIBE 10 MG PO TABS: 10.0000 mg | ORAL_TABLET | Freq: Every day | ORAL | 3 refills | Status: DC

## 2021-10-04 MED ORDER — RANOLAZINE ER 500 MG PO TB12: 500.0000 mg | ORAL_TABLET | Freq: Two times a day (BID) | ORAL | 3 refills | Status: DC

## 2021-10-04 NOTE — Progress Notes (Signed)
? ? ? ?Clinical Summary ?Mr. Stokke is a 61 y.o.male seen today for follow up of the following medical problems ?  ?History is obtained primarily from interview of his wife. Due to patient's history of anoxic brain injury and severe short term memory deficit ?  ?1. CAD ?- hx of BMS to LAD in 2007, followed by stent thrombosis with resulting VF arrest. LAD was restented at that time. ?- cath 2010 LAD 50% prox, 30% ISR in mid LAD, distal LAD 70-80%, LCX 40% ostial, 70-80% narrowing proximal portion LCX in the ramus Rickie Gutierres, RCA 70-80% proximal. LVEF 45%, the distal LAD was stented.   ?- cath Jan 2017 severe ISR of LAD stent, received another DES. Small OM1 subtotally occluded with left to left collaterals ?- echo Jan 2016 LVEF 60-65% ?- seen in [facility] 08/2015 with atypical chest pain, no evidence of ACS. Imdur was added.  ?  ?  ?  ?04/2017 nuclear stress: anterolateral infarct with mild peri-infarct ischemia. Large lateral/inferolateral/inferior/inferioapical defect with moderate ischemia. LVEF 30-44% by nuclear.  ?- 04/2017 echo LVEF 50-55%, multiple WMAs ?  ?-04/2017 cath severe LCX ostial disease, received DES. Moderate ISR of LAD stent. Severe ostial disease of small nondominant RCA.  ?  ?  ?07/2018 echo LVEF 55-60%, apex hypokinetic ?- 05/2019 nuclear stress high degree of ischemia ?  ?- 05/2019 cath as reported below, received DES to prox LAD ?- given stent burden and prior stent thrombosis plan for indefinitie DAPT ?  ?  ?  ?Jan 2022 lexiscan: Large, moderate intensity, apical to basal anterolateral defect that is partially reversible and consistent with ischemia. ?- chest pains improved after stress test and decided to monitor clinically ?- recently wife reports patient with increased SOB/DOE, increased chest pains. Occurring daily now ?- compliant with meds ? ? ?  ?  ?  ?2. Short term memory deficit ?- chronic issue ever since prior cardiac arrest. Patient is not able to give description of symptoms, symptoms  are all relayed through his wife.  ?  ?3. Hyperlipidemia ?- did not tolerate crestor or lipitor ?- has been on pravastatin, then repathat added ?  ? ?10/2020 TC 115 TG 90 HDL 42 LDL 115 ? 05/2021 TC 148 TG 94 HDL 49 LDL 84 ?Jan LDL through insurance 98 ?  ?SH:  ?Wife works [employer]. ?Wifes mother passed Jun 03 2020 with COVID.  ? ? ?Past Medical History:  ?Diagnosis Date  ? Anxiety   ? Brain anoxic injury (HCC)   ? a. 2007 in setting of VF arrest.  ? CAD (coronary artery disease)   ? a. s/p BMS to LAD in 2007 with stent thrombosis and VF arrest and LAD re-stented b. DES to distal LAD in 2010 c. ISR and DES to LAD in 2017 d. DES to LCx in 04/2017 and severe ostial stenosis of small RCA e. 05/2019: DES to proximal LAD.   ? CHF (congestive heart failure) (HCC)   ? Depression   ? Dizziness   ? Dysphagia, unspecified(787.20)   ? GERD (gastroesophageal reflux disease)   ? History of seizure disorder   ? Hyperlipidemia, mixed   ? Hypertension   ? Hypertensive heart disease   ? unspecified  ? Ischemic cardiomyopathy   ? a. 06/2014 Echo: EF 60-65%.  ? Memory loss   ? a. since VF arrest and anoxic brain injury in 2007.  ? Nephrolithiasis   ? Ventricular fibrillation (HCC)   ? a. 2007->VF Arrest.  ? ? ? ?  Allergies  ?Allergen Reactions  ? Sulfamethoxazole-Trimethoprim Rash and Other (See Comments)  ?  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 Crestor [Rosuvastatin Calcium] Other (See Comments)  ?  Myalgias  ? Hydromorphone Hcl Other (See Comments)  ?  Left side went numb  ? Lorazepam Other (See Comments)  ?  Hallucinations   ? Pseudoephedrine Other (See Comments)  ?  Caused a seizure  ? Terfenadine Other (See Comments)  ?  Reaction not known  ? Atorvastatin Other (See Comments)  ?  Muscle pain  ? Biaxin [Clarithromycin] Rash  ?  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 ? ? ?Current Outpatient Medications  ?Medication Sig Dispense Refill  ? aspirin EC 81 MG tablet Take 81 mg by mouth daily.    ? busPIRone (BUSPAR) 15 MG tablet Take 15 mg by mouth 3 (three) times daily.    ? carvedilol (COREG) 25 MG tablet Take 1 tablet (25 mg total) 2 (two) times daily with a meal by mouth. 180 tablet 3  ? clopidogrel (PLAVIX) 75 MG tablet Take 1 tablet (75 mg total) daily by mouth. Same day PCI (Patient taking differently: Take 75 mg by mouth daily.) 90 tablet 3  ? cyclobenzaprine (FLEXERIL) 10 MG tablet Take 10 mg by mouth daily as needed for muscle spasms.    ? Evolocumab (REPATHA SURECLICK) 140 MG/ML SOAJ Inject 140 mg into the skin every 14 (fourteen) days. 2 mL 11  ? FLUoxetine (PROZAC) 20 MG capsule Take 20 mg by mouth in the morning.    ? fluticasone (FLONASE) 50 MCG/ACT nasal spray Place 1 spray into both nostrils daily as needed for allergies.     ? HYDROcodone-acetaminophen (NORCO) 7.5-325 MG tablet Take 1 tablet by mouth See admin instructions. Take 1 tablet by mouth at 9 AM, 12 NOON, 5 PM, and 10 PM    ? HYDROcodone-acetaminophen (NORCO/VICODIN) 5-325 MG tablet Take 1 tablet by mouth 4 (four) times daily as needed for moderate pain.    ? loratadine (CLARITIN) 10 MG tablet Take 10 mg by mouth in the morning.    ? meloxicam (MOBIC) 7.5 MG tablet Take 7.5 mg by mouth daily.    ? nitroGLYCERIN (NITROSTAT) 0.4 MG SL tablet Place 1 tablet (0.4 mg total) every 5 (five) minutes as needed under the tongue for chest pain. 25 tablet 3  ? oxybutynin (DITROPAN) 5 MG tablet Take 5 mg by mouth in the morning and at bedtime.    ? pantoprazole (PROTONIX) 40 MG tablet Take 1 tablet (40 mg total) daily by mouth. (Patient taking differently: Take 40 mg by mouth daily before breakfast.) 90 tablet 3  ? pravastatin (PRAVACHOL) 80 MG tablet Take 1 tablet (80 mg total) by mouth every evening. 90  tablet 3  ? VENTOLIN HFA 108 (90 Base) MCG/ACT inhaler Inhale 1-2 puffs into the lungs every 6 (six) hours as needed for wheezing or shortness of breath.     ? Vitamin D, Ergocalciferol, (DRISDOL) 1.25 MG (50000 UT) CAPS capsule Take 50,000 Units by mouth every Tuesday.    ? ?No current facility-administered medications for this visit.  ? ? ? ?Past Surgical History:  ?Procedure Laterality Date  ? bedside tracheostomy    ? with #6  Shiley. 04/14/2006  ? CARDIAC CATHETERIZATION  09/2005  ? with bare metal stent to the mid LAD (Minivision 2.5 x 18 mm) with Provisional balloon angioplasty to the second diagonal.  ? CARDIAC CATHETERIZATION  03/2006  ? with stenting of the mid LAD with Taxus 2.5 x 20 mm and 2.5 x 8 mm stents.      ? CARDIAC CATHETERIZATION N/A 07/20/2015  ? Procedure: Left Heart Cath and Coronary Angiography;  Surgeon: Corky CraftsJayadeep S Varanasi, MD;  [facility];  Service: Cardiovascular;  Laterality: N/A;  ? CARDIAC CATHETERIZATION N/A 07/20/2015  ? Procedure: Coronary Stent Intervention;  Surgeon: Corky CraftsJayadeep S Varanasi, MD;  [facility];  Service: Cardiovascular;  Laterality: N/A;  ? CORONARY STENT INTERVENTION N/A 05/15/2017  ? Procedure: CORONARY STENT INTERVENTION;  Surgeon: [name], [name], MD;  [facility];  Service: Cardiovascular;  Laterality: N/A;  ? CORONARY STENT INTERVENTION N/A 05/27/2019  ? Procedure: CORONARY STENT INTERVENTION;  Surgeon: [name], [name], MD;  [facility];  Service: Cardiovascular;  Laterality: N/A;  ? CORONARY STENT PLACEMENT  07/20/2015  ? LAD with DES  ? ESOPHAGOGASTRODUODENOSCOPY    ? KIDNEY STONE SURGERY    ? LEFT HEART CATH AND CORONARY ANGIOGRAPHY N/A 05/15/2017  ? Procedure: LEFT HEART CATH AND CORONARY ANGIOGRAPHY;  Surgeon: [name], [name], MD;  [facility];  Service: Cardiovascular;  Laterality: N/A;  ? LEFT HEART CATH AND CORONARY ANGIOGRAPHY N/A 05/27/2019  ? Procedure: LEFT HEART CATH  AND CORONARY ANGIOGRAPHY;  Surgeon: [name], [name], MD;  [facility];  Service: Cardiovascular;  Laterality: N/A;  ? PEG TUBE PLACEMENT    ? PEG TUBE REMOVAL    ? stent thrombosis  03/2006

## 2021-10-04 NOTE — Patient Instructions (Signed)
Medication Instructions:  ?Start Ranexa 500 mg tablets twice daily ?Start Zetia 10 mg tablets daily ? ?Labwork: ?CBC/BMET ? ?Testing/Procedures: ?Left heart Cath- Chest Pain ? ?Follow-Up: ?Follow up with Dr. Wyline Mood or APP in 6 weeks. ? ?Any Other Special Instructions Will Be Listed Below (If Applicable). ? ? ? ? ?If you need a refill on your cardiac medications before your next appointment, please call your pharmacy. ? ? ?[facility] ?[facility] ?[address] ?[place] [address] ?Dept: [phone] ?Loc: [phone] ? ?JERMARI TAMARGO  10/04/2021 ? ?You are scheduled for a Cardiac Catheterization on Monday, April 24 with Dr. Cristal De[place] End. ? ?1. Please arrive at the Main Entrance A at [facility]: [address] [place], [place] [address] at 5:30 AM (This time is two hours before your procedure to ensure your preparation). Free valet parking service is available.  ? ?Special note: Every effort is made to have your procedure done on time. Please understand that emergencies sometimes delay scheduled procedures. ? ?2. Diet: Do not eat solid foods after midnight.  You may have clear liquids until 5 AM upon the day of the procedure. ? ?3. Labs: You will need to have blood drawn on Friday, April 21 at [facility] You do not need to be fasting. ? ?4. Medication instructions in preparation for your procedure: ? ? Contrast Allergy: No ? ?On the morning of your procedure, take Aspirin 81 mg tablet and any morning medicines NOT listed above.  You may use sips of water. ? ?5. Plan to go home the same day, you will only stay overnight if medically necessary. ?6. You MUST have a responsible adult to drive you home. ?7. An adult MUST be with you the first 24 hours after you arrive home. ?8. Bring a current list of your medications, and the last time and date medication taken. ?9. Bring ID and current insurance cards. ?10.Please wear clothes that  are easy to get on and off and wear slip-on shoes. ? ?Thank you for allowing [place] to care for you! ?  -- [facility] ? ?

## 2021-10-17 ENCOUNTER — Other Ambulatory Visit (HOSPITAL_COMMUNITY)
Admission: RE | Admit: 2021-10-17 | Discharge: 2021-10-17 | Disposition: A | Discharge status: Home / Self Care | Payer: 59 | Source: Ambulatory Visit | Attending: Cardiology | Admitting: Cardiology

## 2021-10-17 ENCOUNTER — Telehealth: Payer: Self-pay | Admitting: *Deleted

## 2021-10-17 DIAGNOSIS — Z01818 Encounter for other preprocedural examination: Secondary | ICD-10-CM | POA: Diagnosis present

## 2021-10-17 LAB — CBC
HCT: 40.8 % (ref 39.0–52.0)
Hemoglobin: 13.5 g/dL (ref 13.0–17.0)
MCH: 28.9 pg (ref 26.0–34.0)
MCHC: 33.1 g/dL (ref 30.0–36.0)
MCV: 87.4 fL (ref 80.0–100.0)
Platelets: 234 10*3/uL (ref 150–400)
RBC: 4.67 MIL/uL (ref 4.22–5.81)
RDW: 12.4 % (ref 11.5–15.5)
WBC: 6 10*3/uL (ref 4.0–10.5)
nRBC: 0 % (ref 0.0–0.2)

## 2021-10-17 LAB — BASIC METABOLIC PANEL
Anion gap: 5 (ref 5–15)
BUN: 19 mg/dL (ref 6–20)
CO2: 27 mmol/L (ref 22–32)
Calcium: 8.7 mg/dL — ABNORMAL LOW (ref 8.9–10.3)
Chloride: 104 mmol/L (ref 98–111)
Creatinine, Ser: 0.99 mg/dL (ref 0.61–1.24)
GFR, Estimated: >60 mL/min (ref >60)
Glucose, Bld: 91 mg/dL (ref 70–99)
Potassium: 4.1 mmol/L (ref 3.5–5.1)
Sodium: 136 mmol/L (ref 135–145)

## 2021-10-17 NOTE — Telephone Encounter (Signed)
Cardiac Catheterization scheduled at [facility] for: Monday October 21, 2021 7:30 AM ?Arrival time and place: [facility] Main Entrance A at: 5:30 AM ? ? ?No solid food after midnight prior to cath, clear liquids until 5 AM day of procedure. ? ?Medication instructions: ?-Usual morning medications can be taken with sips of water including aspirin 81 mg and Plavix 75 mg ? ?Confirmed patient has responsible adult to drive home post procedure and be with patient first 24 hours after arriving home. ? ?Patient reports no new symptoms concerning for COVID-19/no exposure to COVID-19 in the past 10 days. ? ?Reviewed procedure instructions with patient's wife (DPR).  ?

## 2021-10-21 ENCOUNTER — Other Ambulatory Visit: Payer: Self-pay

## 2021-10-21 ENCOUNTER — Ambulatory Visit (HOSPITAL_COMMUNITY)
Admission: RE | Admit: 2021-10-21 | Discharge: 2021-10-21 | Disposition: A | Discharge status: Home / Self Care | Payer: 59 | Attending: Internal Medicine | Admitting: Internal Medicine

## 2021-10-21 ENCOUNTER — Encounter (HOSPITAL_COMMUNITY)
Admission: RE | Disposition: A | Discharge status: Home / Self Care | Payer: Self-pay | Source: Home / Self Care | Attending: Internal Medicine

## 2021-10-21 ENCOUNTER — Ambulatory Visit (HOSPITAL_BASED_OUTPATIENT_CLINIC_OR_DEPARTMENT_OTHER): Payer: 59

## 2021-10-21 DIAGNOSIS — E785 Hyperlipidemia, unspecified: Secondary | ICD-10-CM | POA: Diagnosis not present

## 2021-10-21 DIAGNOSIS — I110 Hypertensive heart disease with heart failure: Secondary | ICD-10-CM | POA: Insufficient documentation

## 2021-10-21 DIAGNOSIS — F32A Depression, unspecified: Secondary | ICD-10-CM | POA: Diagnosis not present

## 2021-10-21 DIAGNOSIS — I255 Ischemic cardiomyopathy: Secondary | ICD-10-CM | POA: Insufficient documentation

## 2021-10-21 DIAGNOSIS — I25110 Atherosclerotic heart disease of native coronary artery with unstable angina pectoris: Secondary | ICD-10-CM | POA: Insufficient documentation

## 2021-10-21 DIAGNOSIS — Z955 Presence of coronary angioplasty implant and graft: Secondary | ICD-10-CM | POA: Insufficient documentation

## 2021-10-21 DIAGNOSIS — R079 Chest pain, unspecified: Secondary | ICD-10-CM

## 2021-10-21 DIAGNOSIS — R0602 Shortness of breath: Secondary | ICD-10-CM | POA: Diagnosis not present

## 2021-10-21 DIAGNOSIS — Z87891 Personal history of nicotine dependence: Secondary | ICD-10-CM | POA: Insufficient documentation

## 2021-10-21 DIAGNOSIS — I509 Heart failure, unspecified: Secondary | ICD-10-CM | POA: Insufficient documentation

## 2021-10-21 DIAGNOSIS — Z8674 Personal history of sudden cardiac arrest: Secondary | ICD-10-CM | POA: Insufficient documentation

## 2021-10-21 HISTORY — PX: LEFT HEART CATH AND CORONARY ANGIOGRAPHY: CATH118249

## 2021-10-21 LAB — ECHOCARDIOGRAM COMPLETE
AV Mean grad: 3 mmHg
AV Peak grad: 6.3 mmHg
Ao pk vel: 1.25 m/s
Area-P 1/2: 2.8 cm2
Height: 73 in
S' Lateral: 2 cm
Weight: 3888 oz

## 2021-10-21 SURGERY — LEFT HEART CATH AND CORONARY ANGIOGRAPHY
Anesthesia: LOCAL

## 2021-10-21 MED ORDER — PERFLUTREN LIPID MICROSPHERE
1.0000 mL | INTRAVENOUS | Status: DC | PRN
  Administered 2021-10-21: 2 mL via INTRAVENOUS
  Filled 2021-10-21: qty 10

## 2021-10-21 MED ORDER — LABETALOL HCL 5 MG/ML IV SOLN: 10.0000 mg | INTRAVENOUS | Status: DC | PRN

## 2021-10-21 MED ORDER — HEPARIN SODIUM (PORCINE) 1000 UNIT/ML IJ SOLN
INTRAMUSCULAR | Status: AC
  Filled 2021-10-21: qty 10

## 2021-10-21 MED ORDER — SODIUM CHLORIDE 0.9% FLUSH: 3.0000 mL | Freq: Two times a day (BID) | INTRAVENOUS | Status: DC

## 2021-10-21 MED ORDER — MIDAZOLAM HCL 2 MG/2ML IJ SOLN
INTRAMUSCULAR | Status: DC | PRN
Start: 2021-10-21 — End: 2021-10-21
  Administered 2021-10-21: 1 mg via INTRAVENOUS

## 2021-10-21 MED ORDER — HYDRALAZINE HCL 20 MG/ML IJ SOLN: 10.0000 mg | INTRAMUSCULAR | Status: DC | PRN

## 2021-10-21 MED ORDER — MIDAZOLAM HCL 2 MG/2ML IJ SOLN
INTRAMUSCULAR | Status: AC
  Filled 2021-10-21: qty 2

## 2021-10-21 MED ORDER — LIDOCAINE HCL (PF) 1 % IJ SOLN
INTRAMUSCULAR | Status: DC | PRN
Start: 2021-10-21 — End: 2021-10-21
  Administered 2021-10-21: 2 mL

## 2021-10-21 MED ORDER — HEPARIN (PORCINE) IN NACL 1000-0.9 UT/500ML-% IV SOLN
INTRAVENOUS | Status: DC | PRN
Start: 2021-10-21 — End: 2021-10-21
  Administered 2021-10-21 (×2): 500 mL

## 2021-10-21 MED ORDER — ACETAMINOPHEN 325 MG PO TABS: 650.0000 mg | ORAL_TABLET | ORAL | Status: DC | PRN

## 2021-10-21 MED ORDER — FENTANYL CITRATE (PF) 100 MCG/2ML IJ SOLN
INTRAMUSCULAR | Status: AC
Start: 2021-10-21 — End: ?
  Filled 2021-10-21: qty 2

## 2021-10-21 MED ORDER — SODIUM CHLORIDE 0.9% FLUSH: 3.0000 mL | INTRAVENOUS | Status: DC | PRN

## 2021-10-21 MED ORDER — IOHEXOL 350 MG/ML SOLN
INTRAVENOUS | Status: DC | PRN
  Administered 2021-10-21: 60 mL

## 2021-10-21 MED ORDER — SODIUM CHLORIDE 0.9 % IV SOLN: 250.0000 mL | INTRAVENOUS | Status: DC | PRN

## 2021-10-21 MED ORDER — CLOPIDOGREL BISULFATE 75 MG PO TABS: 75.0000 mg | ORAL_TABLET | ORAL | Status: AC

## 2021-10-21 MED ORDER — FENTANYL CITRATE (PF) 100 MCG/2ML IJ SOLN
INTRAMUSCULAR | Status: DC | PRN
  Administered 2021-10-21: 25 ug via INTRAVENOUS

## 2021-10-21 MED ORDER — VERAPAMIL HCL 2.5 MG/ML IV SOLN
INTRAVENOUS | Status: AC
  Filled 2021-10-21: qty 2

## 2021-10-21 MED ORDER — ONDANSETRON HCL 4 MG/2ML IJ SOLN: 4.0000 mg | Freq: Four times a day (QID) | INTRAMUSCULAR | Status: DC | PRN

## 2021-10-21 MED ORDER — LIDOCAINE HCL (PF) 1 % IJ SOLN
INTRAMUSCULAR | Status: AC
  Filled 2021-10-21: qty 30

## 2021-10-21 MED ORDER — HEPARIN SODIUM (PORCINE) 1000 UNIT/ML IJ SOLN
INTRAMUSCULAR | Status: DC | PRN
Start: 2021-10-21 — End: 2021-10-21
  Administered 2021-10-21: 5000 [IU] via INTRAVENOUS

## 2021-10-21 MED ORDER — ASPIRIN 81 MG PO CHEW: 81.0000 mg | CHEWABLE_TABLET | ORAL | Status: AC

## 2021-10-21 MED ORDER — SODIUM CHLORIDE 0.9 % WEIGHT BASED INFUSION: 1.0000 mL/kg/h | INTRAVENOUS | Status: DC

## 2021-10-21 MED ORDER — SODIUM CHLORIDE 0.9 % WEIGHT BASED INFUSION
3.0000 mL/kg/h | INTRAVENOUS | Status: AC
  Administered 2021-10-21: 3 mL/kg/h via INTRAVENOUS

## 2021-10-21 MED ORDER — VERAPAMIL HCL 2.5 MG/ML IV SOLN
INTRAVENOUS | Status: DC | PRN
  Administered 2021-10-21: 10 mL via INTRA_ARTERIAL

## 2021-10-21 MED ORDER — HEPARIN (PORCINE) IN NACL 1000-0.9 UT/500ML-% IV SOLN
INTRAVENOUS | Status: AC
  Filled 2021-10-21: qty 1000

## 2021-10-21 SURGICAL SUPPLY — 10 items
CATH 5FR JL3.5 JR4 ANG PIG MP (CATHETERS) ×1 IMPLANT
DEVICE RAD COMP TR BAND LRG (VASCULAR PRODUCTS) ×1 IMPLANT
GLIDESHEATH SLEND SS 6F .021 (SHEATH) ×1 IMPLANT
GUIDEWIRE INQWIRE 1.5J.035X260 (WIRE) IMPLANT
INQWIRE 1.5J .035X260CM (WIRE) ×2
KIT HEART LEFT (KITS) ×2 IMPLANT
PACK CARDIAC CATHETERIZATION (CUSTOM PROCEDURE TRAY) ×2 IMPLANT
SYR MEDRAD MARK 7 150ML (SYRINGE) ×2 IMPLANT
TRANSDUCER W/STOPCOCK (MISCELLANEOUS) ×2 IMPLANT
TUBING CIL FLEX 10 FLL-RA (TUBING) ×2 IMPLANT

## 2021-10-21 NOTE — Interval H&P Note (Signed)
History and Physical Interval Note: ? ?10/21/2021 ?7:25 AM ? ?[name] Cummings  has presented today for surgery, with the diagnosis of coronary artery disease with unstable angina.  The various methods of treatment have been discussed with the patient and family. After consideration of risks, benefits and other options for treatment, the patient has consented to  Procedure(s): ?LEFT HEART CATH AND CORONARY ANGIOGRAPHY (N/A) as a surgical intervention.  The patient's history has been reviewed, patient examined, no change in status, stable for surgery.  I have reviewed the patient's chart and labs.  Questions were answered to the patient's satisfaction.   ? ?Cath Lab Visit (complete for each Cath Lab visit) ? ?Clinical Evaluation Leading to the Procedure:  ? ?ACS: No. ? ?Non-ACS:   ? ?Anginal Classification: CCS IV ? ?Anti-ischemic medical therapy: Maximal Therapy (2 or more classes of medications) ? ?Non-Invasive Test Results: Intermediate-risk stress test findings: cardiac mortality 1-3%/year ? ?Prior CABG: No previous CABG ? ? ?[name] Cummings ? ? ?

## 2021-10-21 NOTE — H&P (Signed)
?Cardiology Admission History and Physical:  ? ?Patient ID: [name] Cummings ?MRN: [id number]; DOB: 04/16/1961  ? ?Admission date: 10/21/2021 ? ?PCP:  Wilson, Fred H, MD ?  ?[facility] Providers ?Cardiologist:  Branch, Jonathan, MD    ? ? ?Chief Complaint: Chest pain and shortness of breath ? ?Patient Profile:  ? ?[name] Cummings is a 61 y.o. 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 ? ?History of Present Illness:  ? ?[name] Cummings has followed closely with Dr. Branch, having most recently been seen on 10/04/2021.  At that time, his wife reported that [name] Cummings has been having shortness of breath with minimal activity such as walking to the bathroom as well as chest pains present almost all the time.  It is challenging to differentiate this between angina that he has had in the past and costochondritis.  His wife is concerned because [name] Cummings had similar symptoms leading up to his most recent PCI to the ostial through mid LAD in 2020.  Myocardial perfusion stress test last year showed a large in size, moderate in intensity, anterolateral defect that was partially reversible consistent with ischemia and element of scar.  Medical management was pursued.  However, given progressive symptoms, particularly over the last month, Mr. Eggebrecht was referred for cardiac catheterization and possible PCI. ? ? ?Past Medical History:  ?Diagnosis Date  ? Anxiety   ? Brain anoxic injury (HCC)   ? a. 2007 in setting of VF arrest.  ? CAD (coronary artery disease)   ? a. s/p BMS to LAD in 2007 with stent thrombosis and VF arrest and LAD re-stented b. DES to distal LAD in 2010 c. ISR and DES to LAD in 2017 d. DES to LCx in 04/2017 and severe ostial stenosis of small RCA e. 05/2019: DES  to proximal LAD.   ? CHF (congestive heart failure) (HCC)   ? Depression   ? Dizziness   ? Dysphagia, unspecified(787.20)   ? GERD (gastroesophageal reflux disease)   ? History of seizure disorder   ? Hyperlipidemia, mixed   ? Hypertension   ? Hypertensive heart disease   ? unspecified  ? Ischemic cardiomyopathy   ? a. 06/2014 Echo: EF 60-65%.  ? Memory loss   ? a. since VF arrest and anoxic brain injury in 2007.  ? Nephrolithiasis   ? Ventricular fibrillation (HCC)   ? a. 2007->VF Arrest.  ? ? ?Past Surgical History:  ?Procedure Laterality Date  ? bedside tracheostomy    ? with #6 Shiley. 04/14/2006  ? CARDIAC CATHETERIZATION  09/2005  ? with bare metal stent to the mid LAD (Minivision 2.5 x 18 mm) with Provisional balloon angioplasty to the second diagonal.  ? CARDIAC CATHETERIZATION  03/2006  ? with stenting of the mid LAD with Taxus 2.5 x 20 mm and 2.5 x 8 mm stents.      ? CARDIAC CATHETERIZATION N/A 07/20/2015  ? Procedure: Left Heart Cath and Coronary Angiography;  Surgeon: Jayadeep S Varanasi, MD;  [facility];  Service: Cardiovascular;  Laterality: N/A;  ? CARDIAC CATHETERIZATION N/A 07/20/2015  ? Procedure: Coronary Stent Intervention;  Surgeon: Jayadeep S Varanasi, MD;  Location: [facility];  Service: Cardiovascular;  Laterality: N/A;  ? CORONARY STENT INTERVENTION N/A 05/15/2017  ? Procedure:   CORONARY STENT INTERVENTION;  Surgeon: Tonny Bollman, MD;  Location: [facility];  Service: Cardiovascular;  Laterality: N/A;  ? CORONARY STENT INTERVENTION N/A 05/27/2019  ? Procedure: CORONARY STENT INTERVENTION;  Surgeon: Tonny Bollman, MD;  Location: [facility];  Service: Cardiovascular;  Laterality: N/A;  ? CORONARY STENT PLACEMENT  07/20/2015  ? LAD with DES  ? ESOPHAGOGASTRODUODENOSCOPY    ? KIDNEY STONE SURGERY    ? LEFT HEART CATH AND CORONARY ANGIOGRAPHY N/A 05/15/2017  ? Procedure: LEFT HEART CATH AND CORONARY ANGIOGRAPHY;  Surgeon: Tonny Bollman, MD;  Location:  [facility];  Service: Cardiovascular;  Laterality: N/A;  ? LEFT HEART CATH AND CORONARY ANGIOGRAPHY N/A 05/27/2019  ? Procedure: LEFT HEART CATH AND CORONARY ANGIOGRAPHY;  Surgeon: Tonny Bollman, MD;  Location: [facility] CV [facility];  Service: Cardiovascular;  Laterality: N/A;  ? PEG TUBE PLACEMENT    ? PEG TUBE REMOVAL    ? stent thrombosis  03/2006  ? Complicated my sudden cardiac death  ? TRACHEOSTOMY CLOSURE    ?  ? ?Medications Prior to Admission: ?Prior to Admission medications   ?Medication Sig Start Date Belal Scallon Date Taking? Authorizing Provider  ?aspirin EC 81 MG tablet Take 81 mg by mouth daily.   Yes [provider]  ?busPIRone (BUSPAR) 15 MG tablet Take 15 mg by mouth 3 (three) times daily. 01/29/20  Yes [provider]  ?carvedilol (COREG) 25 MG tablet Take 1 tablet (25 mg total) 2 (two) times daily with a meal by mouth. 05/15/17  Yes Bhagat, Bhavinkumar, PA  ?clopidogrel (PLAVIX) 75 MG tablet Take 1 tablet (75 mg total) daily by mouth. Same day PCI 05/15/17  Yes Bhagat, [place], PA  ?Evolocumab (REPATHA SURECLICK) 140 MG/ML SOAJ Inject 140 mg into the skin every 14 (fourteen) days. 05/23/20  Yes BranchDorothe Pea, MD  ?FLUoxetine (PROZAC) 20 MG capsule Take 20 mg by mouth in the morning. 04/20/14  Yes [provider]  ?fluticasone (FLONASE) 50 MCG/ACT nasal spray Place 1 spray into both nostrils daily as needed for allergies.  04/20/14  Yes [provider]  ?HYDROcodone-acetaminophen (NORCO) 7.5-325 MG tablet Take 1 tablet by mouth 6 (six) times daily. 07/26/20  Yes [provider]  ?loratadine (CLARITIN) 10 MG tablet Take 10 mg by mouth in the morning.   Yes [provider]  ?meloxicam (MOBIC) 7.5 MG tablet Take 7.5 mg by mouth daily.   Yes [provider]  ?nitroGLYCERIN (NITROSTAT) 0.4 MG SL tablet Place 1 tablet (0.4 mg total) every 5 (five) minutes as needed under the tongue for chest pain. 05/15/17  Yes Bhagat, Bhavinkumar, PA   ?oxybutynin (DITROPAN) 5 MG tablet Take 5 mg by mouth in the morning and at bedtime.   Yes [provider]  ?pantoprazole (PROTONIX) 40 MG tablet Take 1 tablet (40 mg total) daily by mouth. 05/15/17  Yes Bhagat, Bhavinkumar, PA  ?pravastatin (PRAVACHOL) 80 MG tablet Take 1 tablet (80 mg total) by mouth every evening. 06/27/20 10/21/21 Yes BranchDorothe Pea, MD  ?Vitamin D, Ergocalciferol, (DRISDOL) 1.25 MG (50000 UT) CAPS capsule Take 50,000 Units by mouth every Tuesday.   Yes [provider]  ?ezetimibe (ZETIA) 10 MG tablet Take 1 tablet (10 mg total) by mouth daily. ?Patient not taking: Reported on 10/17/2021 10/04/21 01/02/22  Antoine Poche, MD  ?ranolazine (RANEXA) 500 MG 12 hr tablet Take 1 tablet (500 mg total) by mouth 2 (two) times daily. ?Patient not taking: Reported on 10/17/2021 10/04/21  Antoine Poche, MD  ?VENTOLIN HFA 108 [phone] Base) MCG/ACT inhaler Inhale 1-2 puffs into the lungs every 6 (six) hours as needed for wheezing or shortness of breath.  07/25/18   [provider]  ?  ? ?Allergies:    ?Allergies  ?Allergen Reactions  ? Sulfamethoxazole-Trimethoprim Rash and Other (See Comments)  ?  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 Crestor [Rosuvastatin Calcium] Other (See Comments)  ?  Myalgias  ? Hydromorphone Hcl Other (See Comments)  ?  Left side went numb  ? Lorazepam Other (See Comments)  ?  Hallucinations   ? Pseudoephedrine Other (See Comments)  ?  Caused a seizure  ? Terfenadine Other (See Comments)  ?  Reaction not known  ? Atorvastatin Other (See Comments)  ?  Muscle pain  ? Biaxin [Clarithromycin] Rash  ?  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 ? ?Social History:   ?Social History  ? ?Socioeconomic  History  ? Marital status: Married  ?  Spouse name: Not on file  ? Number of children: 4  ? Years of education: 25  ? Highest education level: Not on file  ?Occupational History  ? Occupation: Disabled

## 2021-10-21 NOTE — H&P (View-Only) (Signed)
?Cardiology Admission History and Physical:  ? ?Patient ID: [name] Cummings ?MRN: [id number]; DOB: 12/04/1960  ? ?Admission date: 10/21/2021 ? ?PCP:  Barbie BannerWilson, Fred H, MD ?  ?[facility] Providers ?Cardiologist:  Dina RichBranch, Jonathan, MD    ? ? ?Chief Complaint: Chest pain and shortness of breath ? ?Patient Profile:  ? ?[name] Cummings is a 61 y.o. 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 ? ?History of Present Illness:  ? ?[name] Cummings has followed closely with Dr. Wyline MoodBranch, having most recently been seen on 10/04/2021.  At that time, his wife reported that [name] Cummings has been having shortness of breath with minimal activity such as walking to the bathroom as well as chest pains present almost all the time.  It is challenging to differentiate this between angina that he has had in the past and costochondritis.  His wife is concerned because [name] Cummings had similar symptoms leading up to his most recent PCI to the ostial through mid LAD in 2020.  Myocardial perfusion stress test last year showed a large in size, moderate in intensity, anterolateral defect that was partially reversible consistent with ischemia and element of scar.  Medical management was pursued.  However, given progressive symptoms, particularly over the last month, [name] Cummings was referred for cardiac catheterization and possible PCI. ? ? ?Past Medical History:  ?Diagnosis Date  ? Anxiety   ? Brain anoxic injury (HCC)   ? a. 2007 in setting of VF arrest.  ? CAD (coronary artery disease)   ? a. s/p BMS to LAD in 2007 with stent thrombosis and VF arrest and LAD re-stented b. DES to distal LAD in 2010 c. ISR and DES to LAD in 2017 d. DES to LCx in 04/2017 and severe ostial stenosis of small RCA e. 05/2019: DES  to proximal LAD.   ? CHF (congestive heart failure) (HCC)   ? Depression   ? Dizziness   ? Dysphagia, unspecified(787.20)   ? GERD (gastroesophageal reflux disease)   ? History of seizure disorder   ? Hyperlipidemia, mixed   ? Hypertension   ? Hypertensive heart disease   ? unspecified  ? Ischemic cardiomyopathy   ? a. 06/2014 Echo: EF 60-65%.  ? Memory loss   ? a. since VF arrest and anoxic brain injury in 2007.  ? Nephrolithiasis   ? Ventricular fibrillation (HCC)   ? a. 2007->VF Arrest.  ? ? ?Past Surgical History:  ?Procedure Laterality Date  ? bedside tracheostomy    ? with #6 [place]. 04/14/2006  ? CARDIAC CATHETERIZATION  09/2005  ? with bare metal stent to the mid LAD (Minivision 2.5 x 18 mm) with Provisional balloon angioplasty to the second diagonal.  ? CARDIAC CATHETERIZATION  03/2006  ? with stenting of the mid LAD with Taxus 2.5 x 20 mm and 2.5 x 8 mm stents.      ? CARDIAC CATHETERIZATION N/A 07/20/2015  ? Procedure: Left Heart Cath and Coronary Angiography;  Surgeon: Corky CraftsJayadeep S Varanasi, MD;  [facility];  Service: Cardiovascular;  Laterality: N/A;  ? CARDIAC CATHETERIZATION N/A 07/20/2015  ? Procedure: Coronary Stent Intervention;  Surgeon: Corky CraftsJayadeep S Varanasi, MD;  [facility];  Service: Cardiovascular;  Laterality: N/A;  ? CORONARY STENT INTERVENTION N/A 05/15/2017  ? Procedure:  CORONARY STENT INTERVENTION;  Surgeon: Tonny Bollman, MD;  Location: [facility] INVASIVE CV LAB;  Service: Cardiovascular;  Laterality: N/A;  ? CORONARY STENT INTERVENTION N/A 05/27/2019  ? Procedure: CORONARY STENT INTERVENTION;  Surgeon: Tonny Bollman, MD;  Location: [facility] INVASIVE CV LAB;  Service: Cardiovascular;  Laterality: N/A;  ? CORONARY STENT PLACEMENT  07/20/2015  ? LAD with DES  ? ESOPHAGOGASTRODUODENOSCOPY    ? KIDNEY STONE SURGERY    ? LEFT HEART CATH AND CORONARY ANGIOGRAPHY N/A 05/15/2017  ? Procedure: LEFT HEART CATH AND CORONARY ANGIOGRAPHY;  Surgeon: Tonny Bollman, MD;  Location:  [facility] INVASIVE CV LAB;  Service: Cardiovascular;  Laterality: N/A;  ? LEFT HEART CATH AND CORONARY ANGIOGRAPHY N/A 05/27/2019  ? Procedure: LEFT HEART CATH AND CORONARY ANGIOGRAPHY;  Surgeon: Tonny Bollman, MD;  Location: [facility] INVASIVE CV LAB;  Service: Cardiovascular;  Laterality: N/A;  ? PEG TUBE PLACEMENT    ? PEG TUBE REMOVAL    ? stent thrombosis  03/2006  ? Complicated my sudden cardiac death  ? TRACHEOSTOMY CLOSURE    ?  ? ?Medications Prior to Admission: ?Prior to Admission medications   ?Medication Sig Start Date January Bergthold Date Taking? Authorizing Provider  ?aspirin EC 81 MG tablet Take 81 mg by mouth daily.   Yes [provider]  ?busPIRone (BUSPAR) 15 MG tablet Take 15 mg by mouth 3 (three) times daily. 01/29/20  Yes [provider]  ?carvedilol (COREG) 25 MG tablet Take 1 tablet (25 mg total) 2 (two) times daily with a meal by mouth. 05/15/17  Yes Bhagat, Bhavinkumar, PA  ?clopidogrel (PLAVIX) 75 MG tablet Take 1 tablet (75 mg total) daily by mouth. Same day PCI 05/15/17  Yes Bhagat, [place], PA  ?Evolocumab (REPATHA SURECLICK) 140 MG/ML SOAJ Inject 140 mg into the skin every 14 (fourteen) days. 05/23/20  Yes BranchDorothe Pea, MD  ?FLUoxetine (PROZAC) 20 MG capsule Take 20 mg by mouth in the morning. 04/20/14  Yes [provider]  ?fluticasone (FLONASE) 50 MCG/ACT nasal spray Place 1 spray into both nostrils daily as needed for allergies.  04/20/14  Yes [provider]  ?HYDROcodone-acetaminophen (NORCO) 7.5-325 MG tablet Take 1 tablet by mouth 6 (six) times daily. 07/26/20  Yes [provider]  ?loratadine (CLARITIN) 10 MG tablet Take 10 mg by mouth in the morning.   Yes [provider]  ?meloxicam (MOBIC) 7.5 MG tablet Take 7.5 mg by mouth daily.   Yes [provider]  ?nitroGLYCERIN (NITROSTAT) 0.4 MG SL tablet Place 1 tablet (0.4 mg total) every 5 (five) minutes as needed under the tongue for chest pain. 05/15/17  Yes Bhagat, Bhavinkumar, PA   ?oxybutynin (DITROPAN) 5 MG tablet Take 5 mg by mouth in the morning and at bedtime.   Yes [provider]  ?pantoprazole (PROTONIX) 40 MG tablet Take 1 tablet (40 mg total) daily by mouth. 05/15/17  Yes Bhagat, Bhavinkumar, PA  ?pravastatin (PRAVACHOL) 80 MG tablet Take 1 tablet (80 mg total) by mouth every evening. 06/27/20 10/21/21 Yes BranchDorothe Pea, MD  ?Vitamin D, Ergocalciferol, (DRISDOL) 1.25 MG (50000 UT) CAPS capsule Take 50,000 Units by mouth every Tuesday.   Yes [provider]  ?ezetimibe (ZETIA) 10 MG tablet Take 1 tablet (10 mg total) by mouth daily. ?Patient not taking: Reported on 10/17/2021 10/04/21 01/02/22  Antoine Poche, MD  ?ranolazine (RANEXA) 500 MG 12 hr tablet Take 1 tablet (500 mg total) by mouth 2 (two) times daily. ?Patient not taking: Reported on 10/17/2021 10/04/21  Antoine Poche, MD  ?VENTOLIN HFA 108 [phone] Base) MCG/ACT inhaler Inhale 1-2 puffs into the lungs every 6 (six) hours as needed for wheezing or shortness of breath.  07/25/18   [provider]  ?  ? ?Allergies:    ?Allergies  ?Allergen Reactions  ? Sulfamethoxazole-Trimethoprim Rash and Other (See Comments)  ?  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 Crestor [Rosuvastatin Calcium] Other (See Comments)  ?  Myalgias  ? Hydromorphone Hcl Other (See Comments)  ?  Left side went numb  ? Lorazepam Other (See Comments)  ?  Hallucinations   ? Pseudoephedrine Other (See Comments)  ?  Caused a seizure  ? Terfenadine Other (See Comments)  ?  Reaction not known  ? Atorvastatin Other (See Comments)  ?  Muscle pain  ? Biaxin [Clarithromycin] Rash  ?  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 ? ?Social History:   ?Social History  ? ?Socioeconomic  History  ? Marital status: Married  ?  Spouse name: Not on file  ? Number of children: 4  ? Years of education: 25  ? Highest education level: Not on file  ?Occupational History  ? Occupation: Disabled

## 2021-10-22 ENCOUNTER — Telehealth: Payer: Self-pay | Admitting: *Deleted

## 2021-10-22 ENCOUNTER — Encounter (HOSPITAL_COMMUNITY): Payer: Self-pay | Admitting: Internal Medicine

## 2021-10-22 NOTE — Telephone Encounter (Signed)
-----   Message from Yvonne Kendall, MD sent at 10/22/2021  1:56 PM EDT ----- ?Please let [name] Cummings know that his heart is functioning fairly well, though it is a little bit sluggish near the apex.  This is most likely related to some of the blockages that we uncovered on yesterday's catheterization.  No significant valvular abnormality is present.  I think it is reasonable for [place] to move ahead with the stenting procedure next Monday, as planned.  [name] Cummings should continue to take his current medications, including aspirin and clopidogrel. ?

## 2021-10-22 NOTE — Telephone Encounter (Signed)
Attempted to call pt. No answer. Lmtcb.  

## 2021-10-22 NOTE — Telephone Encounter (Signed)
error 

## 2021-10-24 ENCOUNTER — Telehealth: Payer: Self-pay | Admitting: *Deleted

## 2021-10-24 DIAGNOSIS — I25119 Atherosclerotic heart disease of native coronary artery with unspecified angina pectoris: Secondary | ICD-10-CM

## 2021-10-24 DIAGNOSIS — Z01812 Encounter for preprocedural laboratory examination: Secondary | ICD-10-CM

## 2021-10-24 NOTE — Telephone Encounter (Signed)
At patient's wife (DPR) request, Coronary Stent has been changed to 11/06/21  arrive 8 AM for 10 AM procedure Dr Excell Seltzer. ?Reviewed procedure instructions with patient's wife, will plan BMP/CBC [facility] Hawking next week. ? ?Coronary Stent scheduled at [facility] for: Wednesday Nov 06, 2021 10 AM ?Arrival time and place: [facility] Main Entrance A at: 8 AM ? ? ?No solid food after midnight prior to cath, clear liquids until 5 AM day of procedure. ? ?Medication instructions: ?-Usual morning medications can be taken with sips of water including aspirin 81 mg and Plavix 75 mg. ? ?Confirmed patient has responsible adult to drive home post procedure and be with patient first 24 hours after arriving home. ? ? ? ?

## 2021-10-31 NOTE — Telephone Encounter (Signed)
Spoke with pt's wife (DPR), notified of the result and verbalized understanding.  All questions (if any) were answered. ?

## 2021-11-04 ENCOUNTER — Other Ambulatory Visit (HOSPITAL_COMMUNITY)
Admission: RE | Admit: 2021-11-04 | Discharge: 2021-11-04 | Disposition: A | Discharge status: Home / Self Care | Payer: 59 | Source: Ambulatory Visit | Attending: Cardiovascular Disease | Admitting: Cardiovascular Disease

## 2021-11-04 DIAGNOSIS — Z01812 Encounter for preprocedural laboratory examination: Secondary | ICD-10-CM | POA: Diagnosis present

## 2021-11-04 DIAGNOSIS — I25119 Atherosclerotic heart disease of native coronary artery with unspecified angina pectoris: Secondary | ICD-10-CM | POA: Insufficient documentation

## 2021-11-04 LAB — BASIC METABOLIC PANEL
Anion gap: 7 (ref 5–15)
BUN: 14 mg/dL (ref 6–20)
CO2: 26 mmol/L (ref 22–32)
Calcium: 9.3 mg/dL (ref 8.9–10.3)
Chloride: 107 mmol/L (ref 98–111)
Creatinine, Ser: 0.99 mg/dL (ref 0.61–1.24)
GFR, Estimated: >60 mL/min (ref >60)
Glucose, Bld: 110 mg/dL — ABNORMAL HIGH (ref 70–99)
Potassium: 4.5 mmol/L (ref 3.5–5.1)
Sodium: 140 mmol/L (ref 135–145)

## 2021-11-04 LAB — CBC
HCT: 40.7 % (ref 39.0–52.0)
Hemoglobin: 13.7 g/dL (ref 13.0–17.0)
MCH: 29.1 pg (ref 26.0–34.0)
MCHC: 33.7 g/dL (ref 30.0–36.0)
MCV: 86.6 fL (ref 80.0–100.0)
Platelets: 213 10*3/uL (ref 150–400)
RBC: 4.7 MIL/uL (ref 4.22–5.81)
RDW: 12.2 % (ref 11.5–15.5)
WBC: 4.3 10*3/uL (ref 4.0–10.5)
nRBC: 0 % (ref 0.0–0.2)

## 2021-11-05 ENCOUNTER — Telehealth: Payer: Self-pay | Admitting: *Deleted

## 2021-11-05 NOTE — Telephone Encounter (Signed)
Coronary Stent scheduled at [facility] for: Wednesday Nov 06, 2021 10 AM ?Arrival time and place: [facility] Main Entrance A at: 8 AM ? ? ?Nothing to eat after midnight prior to cath, clear liquids until 5 AM day of procedure. ? ?Medication instructions: ?-Usual morning medications can be taken with sips of water including aspirin 81 mg and Plavix 75 mg. ? ?Confirmed patient has responsible adult to drive home post procedure and be with patient first 24 hours after arriving home. ? ?Patient reports no new symptoms concerning for COVID-19/no exposure to COVID-19 in the past 10 days. ? ?Reviewed procedure instructions with patient's wife (DPR).  ?

## 2021-11-06 ENCOUNTER — Ambulatory Visit (HOSPITAL_COMMUNITY)
Admission: RE | Admit: 2021-11-06 | Discharge: 2021-11-06 | Disposition: A | Discharge status: Home / Self Care | Payer: 59 | Attending: Cardiovascular Disease | Admitting: Cardiovascular Disease

## 2021-11-06 ENCOUNTER — Ambulatory Visit (HOSPITAL_COMMUNITY)
Admission: RE | Disposition: A | Discharge status: Home / Self Care | Payer: Self-pay | Source: Home / Self Care | Attending: Cardiovascular Disease

## 2021-11-06 DIAGNOSIS — T82855A Stenosis of coronary artery stent, initial encounter: Secondary | ICD-10-CM | POA: Insufficient documentation

## 2021-11-06 DIAGNOSIS — R0602 Shortness of breath: Secondary | ICD-10-CM | POA: Insufficient documentation

## 2021-11-06 DIAGNOSIS — Z7902 Long term (current) use of antithrombotics/antiplatelets: Secondary | ICD-10-CM | POA: Diagnosis not present

## 2021-11-06 DIAGNOSIS — I2510 Atherosclerotic heart disease of native coronary artery without angina pectoris: Secondary | ICD-10-CM | POA: Diagnosis present

## 2021-11-06 DIAGNOSIS — Z955 Presence of coronary angioplasty implant and graft: Secondary | ICD-10-CM | POA: Insufficient documentation

## 2021-11-06 DIAGNOSIS — G931 Anoxic brain damage, not elsewhere classified: Secondary | ICD-10-CM | POA: Diagnosis present

## 2021-11-06 DIAGNOSIS — Z87891 Personal history of nicotine dependence: Secondary | ICD-10-CM | POA: Insufficient documentation

## 2021-11-06 DIAGNOSIS — I110 Hypertensive heart disease with heart failure: Secondary | ICD-10-CM | POA: Diagnosis not present

## 2021-11-06 DIAGNOSIS — I10 Essential (primary) hypertension: Secondary | ICD-10-CM | POA: Diagnosis present

## 2021-11-06 DIAGNOSIS — I255 Ischemic cardiomyopathy: Secondary | ICD-10-CM | POA: Diagnosis present

## 2021-11-06 DIAGNOSIS — Y832 Surgical operation with anastomosis, bypass or graft as the cause of abnormal reaction of the patient, or of later complication, without mention of misadventure at the time of the procedure: Secondary | ICD-10-CM | POA: Diagnosis not present

## 2021-11-06 DIAGNOSIS — I509 Heart failure, unspecified: Secondary | ICD-10-CM | POA: Insufficient documentation

## 2021-11-06 DIAGNOSIS — I25119 Atherosclerotic heart disease of native coronary artery with unspecified angina pectoris: Secondary | ICD-10-CM | POA: Diagnosis not present

## 2021-11-06 DIAGNOSIS — I25110 Atherosclerotic heart disease of native coronary artery with unstable angina pectoris: Secondary | ICD-10-CM | POA: Diagnosis present

## 2021-11-06 DIAGNOSIS — E782 Mixed hyperlipidemia: Secondary | ICD-10-CM | POA: Diagnosis not present

## 2021-11-06 DIAGNOSIS — F32A Depression, unspecified: Secondary | ICD-10-CM | POA: Insufficient documentation

## 2021-11-06 DIAGNOSIS — I200 Unstable angina: Secondary | ICD-10-CM

## 2021-11-06 DIAGNOSIS — Z8679 Personal history of other diseases of the circulatory system: Secondary | ICD-10-CM

## 2021-11-06 DIAGNOSIS — Z8674 Personal history of sudden cardiac arrest: Secondary | ICD-10-CM | POA: Diagnosis not present

## 2021-11-06 DIAGNOSIS — Z7982 Long term (current) use of aspirin: Secondary | ICD-10-CM | POA: Diagnosis not present

## 2021-11-06 DIAGNOSIS — E785 Hyperlipidemia, unspecified: Secondary | ICD-10-CM | POA: Diagnosis present

## 2021-11-06 HISTORY — PX: CORONARY ANGIOGRAPHY: CATH118303

## 2021-11-06 HISTORY — PX: CORONARY STENT INTERVENTION: CATH118234

## 2021-11-06 LAB — POCT ACTIVATED CLOTTING TIME: Activated Clotting Time: 305 seconds

## 2021-11-06 SURGERY — CORONARY STENT INTERVENTION
Anesthesia: LOCAL

## 2021-11-06 MED ORDER — SODIUM CHLORIDE 0.9% FLUSH: 3.0000 mL | INTRAVENOUS | Status: DC | PRN

## 2021-11-06 MED ORDER — SODIUM CHLORIDE 0.9 % IV SOLN
250.0000 mL | INTRAVENOUS | Status: DC | PRN
Start: 2021-11-06 — End: 2021-11-06

## 2021-11-06 MED ORDER — SODIUM CHLORIDE 0.9 % IV SOLN: 250.0000 mL | INTRAVENOUS | Status: DC | PRN

## 2021-11-06 MED ORDER — ACETAMINOPHEN 325 MG PO TABS: 650.0000 mg | ORAL_TABLET | ORAL | Status: DC | PRN

## 2021-11-06 MED ORDER — ASPIRIN 81 MG PO CHEW: 81.0000 mg | CHEWABLE_TABLET | ORAL | Status: DC

## 2021-11-06 MED ORDER — IOHEXOL 350 MG/ML SOLN
INTRAVENOUS | Status: DC | PRN
  Administered 2021-11-06: 65 mL

## 2021-11-06 MED ORDER — CLOPIDOGREL BISULFATE 75 MG PO TABS: 75.0000 mg | ORAL_TABLET | ORAL | Status: DC

## 2021-11-06 MED ORDER — NITROGLYCERIN 1 MG/10 ML FOR IR/CATH LAB
INTRA_ARTERIAL | Status: AC
  Filled 2021-11-06: qty 10

## 2021-11-06 MED ORDER — ONDANSETRON HCL 4 MG/2ML IJ SOLN: 4.0000 mg | Freq: Four times a day (QID) | INTRAMUSCULAR | Status: DC | PRN

## 2021-11-06 MED ORDER — SODIUM CHLORIDE 0.9 % IV SOLN: INTRAVENOUS | Status: AC

## 2021-11-06 MED ORDER — FENTANYL CITRATE (PF) 100 MCG/2ML IJ SOLN
INTRAMUSCULAR | Status: DC | PRN
  Administered 2021-11-06: 25 ug via INTRAVENOUS

## 2021-11-06 MED ORDER — HEPARIN (PORCINE) IN NACL 1000-0.9 UT/500ML-% IV SOLN
INTRAVENOUS | Status: AC
  Filled 2021-11-06: qty 1000

## 2021-11-06 MED ORDER — FENTANYL CITRATE (PF) 100 MCG/2ML IJ SOLN
INTRAMUSCULAR | Status: AC
  Filled 2021-11-06: qty 2

## 2021-11-06 MED ORDER — VERAPAMIL HCL 2.5 MG/ML IV SOLN
INTRAVENOUS | Status: DC | PRN
  Administered 2021-11-06: 10 mL via INTRA_ARTERIAL

## 2021-11-06 MED ORDER — SODIUM CHLORIDE 0.9 % WEIGHT BASED INFUSION: 1.0000 mL/kg/h | INTRAVENOUS | Status: DC

## 2021-11-06 MED ORDER — HEPARIN (PORCINE) IN NACL 1000-0.9 UT/500ML-% IV SOLN
INTRAVENOUS | Status: DC | PRN
Start: 2021-11-06 — End: 2021-11-06
  Administered 2021-11-06 (×2): 500 mL

## 2021-11-06 MED ORDER — LIDOCAINE HCL (PF) 1 % IJ SOLN
INTRAMUSCULAR | Status: AC
  Filled 2021-11-06: qty 30

## 2021-11-06 MED ORDER — MIDAZOLAM HCL 2 MG/2ML IJ SOLN
INTRAMUSCULAR | Status: DC | PRN
  Administered 2021-11-06: 2 mg via INTRAVENOUS

## 2021-11-06 MED ORDER — VERAPAMIL HCL 2.5 MG/ML IV SOLN
INTRAVENOUS | Status: AC
  Filled 2021-11-06: qty 2

## 2021-11-06 MED ORDER — MIDAZOLAM HCL 2 MG/2ML IJ SOLN
INTRAMUSCULAR | Status: AC
  Filled 2021-11-06: qty 2

## 2021-11-06 MED ORDER — HEPARIN SODIUM (PORCINE) 1000 UNIT/ML IJ SOLN
INTRAMUSCULAR | Status: DC | PRN
  Administered 2021-11-06: 10000 [IU] via INTRAVENOUS

## 2021-11-06 MED ORDER — SODIUM CHLORIDE 0.9% FLUSH: 3.0000 mL | Freq: Two times a day (BID) | INTRAVENOUS | Status: DC

## 2021-11-06 MED ORDER — HEPARIN SODIUM (PORCINE) 1000 UNIT/ML IJ SOLN
INTRAMUSCULAR | Status: AC
  Filled 2021-11-06: qty 10

## 2021-11-06 MED ORDER — LABETALOL HCL 5 MG/ML IV SOLN: 10.0000 mg | INTRAVENOUS | Status: AC | PRN

## 2021-11-06 MED ORDER — HYDRALAZINE HCL 20 MG/ML IJ SOLN: 10.0000 mg | INTRAMUSCULAR | Status: AC | PRN

## 2021-11-06 MED ORDER — LIDOCAINE HCL (PF) 1 % IJ SOLN
INTRAMUSCULAR | Status: DC | PRN
  Administered 2021-11-06: 2 mL

## 2021-11-06 SURGICAL SUPPLY — 21 items
BALLN EUPHORA RX 3.5X12 (BALLOONS) ×2
BALLN ~~LOC~~ EMERGE MR 4.5X12 (BALLOONS) ×2
BALLOON EUPHORA RX 3.5X12 (BALLOONS) IMPLANT
BALLOON ~~LOC~~ EMERGE MR 4.5X12 (BALLOONS) IMPLANT
CATH OPTICROSS HD (CATHETERS) ×1 IMPLANT
CATH VISTA GUIDE 6FR XBLAD3.5 (CATHETERS) ×1 IMPLANT
DEVICE RAD COMP TR BAND LRG (VASCULAR PRODUCTS) ×1 IMPLANT
ELECT DEFIB PAD ADLT CADENCE (PAD) ×1 IMPLANT
GLIDESHEATH SLEND SS 6F .021 (SHEATH) ×1 IMPLANT
GUIDEWIRE INQWIRE 1.5J.035X260 (WIRE) IMPLANT
INQWIRE 1.5J .035X260CM (WIRE) ×4
KIT ENCORE 26 ADVANTAGE (KITS) ×1 IMPLANT
KIT HEART LEFT (KITS) ×2 IMPLANT
KIT HEMO VALVE WATCHDOG (MISCELLANEOUS) ×1 IMPLANT
PACK CARDIAC CATHETERIZATION (CUSTOM PROCEDURE TRAY) ×2 IMPLANT
SLED PULL BACK IVUS (MISCELLANEOUS) ×1 IMPLANT
STENT SYNERGY XD 4.0X16 (Permanent Stent) IMPLANT
SYNERGY XD 4.0X16 (Permanent Stent) ×2 IMPLANT
TRANSDUCER W/STOPCOCK (MISCELLANEOUS) ×2 IMPLANT
TUBING CIL FLEX 10 FLL-RA (TUBING) ×2 IMPLANT
WIRE COUGAR XT STRL 190CM (WIRE) ×2 IMPLANT

## 2021-11-06 NOTE — Progress Notes (Signed)
Per [place], PA ok to d/c home ?

## 2021-11-06 NOTE — Progress Notes (Signed)
Gave pt Scientist, clinical (histocompatibility and immunogenetics) however he did not know he had a stent today due to memory loss. Therefore called his wife and discussed/reviewed education over the phone. Discussed Plavix, restrictions, exercise, diet, NTG and CRPII. She is well versed on controlling his CAD however pt is resistant to exercise. Will refer to [place] however pt has refused to exercise in the past.  ?[phone] ?Yves Dill CES, ACSM ?2:44 PM ?11/06/2021 ? ?

## 2021-11-06 NOTE — Progress Notes (Signed)
Pt ambulated in hall and to BR w/o difficulty ?

## 2021-11-06 NOTE — Discharge Summary (Signed)
?Discharge Summary for Same Day PCI  ? ?Patient ID: [name] Cummings ?MRN: [id number]; DOB: 1961/01/03 ? ?Admit date: 11/06/2021 ?Discharge date: 11/06/2021 ? ?Primary Care Provider: Christain [place], MD  ?Primary Cardiologist: Carlyle Dolly, MD  ?Primary Electrophysiologist:  None  ? ?Discharge Diagnoses  ?  ?Principal Problem: ?  Progressive angina ([place]) ?Active Problems: ?  CAD ?  Essential hypertension ?  Ischemic cardiomyopathy ?  History of ventricular fibrillation ?  Hyperlipidemia ?  Brain anoxic injury ([place]) ? ? ? ?Diagnostic Studies/Procedures  ?  ?Coronary Stent Intervention 11/06/2021: ?Successful stenting of severe distal left main and ostial LAD stenoses using a 4.0 x 16 mm Synergy DES using intravascular ultrasound guidance ?  ?Recommend: Same-day PCI protocol as long as no complications arise.  Continue dual antiplatelet therapy with aspirin and clopidogrel indefinitely. ? ?Diagnostic ?Dominance: Left ?Intervention ? ? ? ?_____________ ?  ?History of Present Illness   ?  ?[name] Cummings is a 61 y.o. male with CAD with multiple PCIs (most recently DES to proximal LAD in 05/2019), VF arrest in 2007 in setting of LAD stent thrombosis with resultant anoxic brain injury, ischemic cardiomyopathy, hypertension, hyperlipidemia, GERD, seizure disorder, depression, and memory loss from anoxic brain injury. Patient was seen by Dr. Harl Bowie on 10/04/2021 and wife reported progressive shortness of breath with minimal activity such as walking to the bathroom as well as almost constant chest pain. It has been challenging to differentiate this between the angina he has had in the past and costochondritis. His wife was concerned that he had similar symptoms leading up to his most recent PCI in 2020. Therefore, decision was made to proceed with cardiac catheterization. LHC on 10/21/2021 showed significant stenoses of up to 75% involving the distal left main, ostial LAD, and mid LAD (beyond previously stented segment) as well as  75% stenosis of proximal RCA and chronic subtotal occlusion of OM1. Echo was ordered and showed LVEF of 55-60% with akinesis of the apical segment and mild LVH. Images were reviewed with Dr. Burt Knack and recurrent dyspnea and chest pain were felt to likely be due to recurrent CAD involving the LAD and now extending into the distal left main. He was not felt to be a good candidate for CABG; therefore, plan was to high-risk PCI of these areas [facility]. ? ?Hospital Course  ?   ?Patient presented to [facility] on 11/06/2021 for planned high risk PCI of left main and ostial LAD. He underwent successful PCI with DES to severe distal left main and ostial LAD. He tolerated the procedure well. Plan is for DAPT with Aspirin and Plavix indefinitely given left main stent and multiple PCIs. The patient was seen by Cardiac Rehab while in short stay. There were no observed complications post cath. Right radial cath site was re-evaluated prior to discharge and found to be stable without any complications. Instructions/precautions regarding cath site care were given prior to discharge. ? ?[name] was seen by Dr. Burt Knack and determined stable for discharge home. Follow up with our office has been arranged. Medications are listed below. Pertinent changes include stopping Meloxicam. He is already on Aspirin and Plavix at home. Patient has not started Ranexa or Zetia due to cost but wife reported they were going to recheck with the pharmacy because she thinks they should have met their deductible now. He has close follow-up with Dr. Harl Bowie next week so recommended discussing at that time if cost remains an issue. ?_____________ ? ?Cath/PCI Registry Performance &  Quality Measures: ?Aspirin prescribed? - Yes ?ADP Receptor Inhibitor (Plavix/Clopidogrel, Brilinta/Ticagrelor or Effient/Prasugrel) prescribed (includes medically managed patients)? - Yes ?High Intensity Statin (Lipitor 40-13m or Crestor 20-454m prescribed? - No -  intolerant but on Repatha ?For EF <40%, was ACEI/ARB prescribed? - Not Applicable (EF >/= 4016%?For EF <40%, Aldosterone Antagonist (Spironolactone or Eplerenone) prescribed? - Not Applicable (EF >/= 4010%?Cardiac Rehab Phase II ordered (Included Medically managed Patients)? - Yes ? ?_____________ ? ? ?Discharge Vitals ?Blood pressure 112/60, pulse (!) 48, temperature 97.8 ?F (36.6 ?C), temperature source Oral, resp. rate 14, height 6' 1.5" (1.867 m), weight 110.2 kg, SpO2 95 %.  ?FDanley Dankereights  ? 11/06/21 [id number]?Weight: 110.2 kg  ? ? ?Last Labs & Radiologic Studies  ?  ?CBC ?Recent Labs  ?  11/04/21 ?0945  ?WBC 4.3  ?HGB 13.7  ?HCT 40.7  ?MCV 86.6  ?PLT 213  ? ?Basic Metabolic Panel ?Recent Labs  ?  11/04/21 ?0945  ?NA 140  ?K 4.5  ?CL 107  ?CO2 26  ?GLUCOSE 110*  ?BUN 14  ?CREATININE 0.99  ?CALCIUM 9.3  ? ?Liver Function Tests ?No results for input(s): AST, ALT, ALKPHOS, BILITOT, PROT, ALBUMIN in the last 72 hours. ?No results for input(s): LIPASE, AMYLASE in the last 72 hours. ?High Sensitivity Troponin:   ?No results for input(s): TROPONINIHS in the last 720 hours.  ?BNP ?Invalid input(s): POCBNP ?D-Dimer ?No results for input(s): DDIMER in the last 72 hours. ?Hemoglobin A1C ?No results for input(s): HGBA1C in the last 72 hours. ?Fasting Lipid Panel ?No results for input(s): CHOL, HDL, LDLCALC, TRIG, CHOLHDL, LDLDIRECT in the last 72 hours. ?Thyroid Function Tests ?No results for input(s): TSH, T4TOTAL, T3FREE, THYROIDAB in the last 72 hours. ? ?Invalid input(s): FREET3 ?_____________  ?CARDIAC CATHETERIZATION ? ?Result Date: 11/06/2021 ?Successful stenting of severe distal left main and ostial LAD stenoses using a 4.0 x 16 mm Synergy DES using intravascular ultrasound guidance Recommend: Same-day PCI protocol as long as no complications arise.  Continue dual antiplatelet therapy with aspirin and clopidogrel indefinitely.  ? ?CARDIAC CATHETERIZATION ? ?Result Date: 10/21/2021 ?Conclusions: Significant stenoses of  up to 75% involving distal LMCA, ostial LAD and mid LAD (beyond previously stented segment). Chronic subtotal occlusion of OM1. Widely patent stent in the ostial/proximal LCx, which extends back into the distal LMCA. Low normal left ventricular contraction with subtle apical hypokinesis (LVEF 50-55%). Mildly elevated left ventricular filling pressure (LVEDP 21 mmHg). Recommendations: Images reviewed with Dr. CoBurt Knacknd case discussed with Mr. PeDuntonnd his wife.  His recurrent dyspnea and chest pain are most likely related to recurrent CAD involving the LAD and now extending into the distal LMCA.  We have all agreed that he is not a good candidate for CABG given his remote VF arrest with continued memory deficits from anoxic brain injury.  We will plan for PCI to unprotected LMCA and LAD next week. Obtain echocardiogram to better assess LVEF and exclude underlying valvular abnormalities that could affect high-risk PCI. Continue indefinite DAPT with aspirin and clopidogrel. Aggressive secondary prevention of coronary artery disease. ChNelva BushMD [facility] ? ?ECHOCARDIOGRAM COMPLETE ? ?Result Date: 10/21/2021 ?   ECHOCARDIOGRAM REPORT   Patient Name:   JESHARVIL HOEYate of Exam: 10/21/2021 Medical Rec #:  [id number]    Height:       73.0 in Accession #:    [id number]   Weight:       243.0 lb Date of Birth:  11/24/1960       BSA:          2.337 m? Patient Age:    61 years       BP:           122/70 mmHg Patient Gender: M              HR:           52 bpm. Exam Location:  Inpatient Procedure: 2D Echo, Color Doppler and Cardiac Doppler Indications:    chest pain  History:        Patient has prior history of Echocardiogram examinations, most                 recent 08/13/2018. Cardiomegaly and CHF; Risk                 Factors:Hypertension and Dyslipidemia.  Sonographer:    Luisa Hart Referring Phys: [phone] CHRISTOPHER END IMPRESSIONS  1. There is sluggish blood flow by definity contrast in the apex. There is  no obvious LV apical thrombus but increased risk for thrombus formation. Left ventricular ejection fraction, by estimation, is 55 to 60%. The left ventricle has normal function. The left ventricle demonstrat

## 2021-11-06 NOTE — Interval H&P Note (Signed)
Cath Lab Visit (complete for each Cath Lab visit) ? ?Clinical Evaluation Leading to the Procedure:  ? ?ACS: No. ? ?Non-ACS:   ? ?Anginal Classification: CCS III ? ?Anti-ischemic medical therapy: Minimal Therapy (1 class of medications) ? ?Non-Invasive Test Results: No non-invasive testing performed ? ?Prior CABG: No previous CABG ? ? ? ? ? ?History and Physical Interval Note: ? ?11/06/2021 ?10:07 AM ? ?HARVEST STANCO  has presented today for surgery, with the diagnosis of cad.  The various methods of treatment have been discussed with the patient and family. After consideration of risks, benefits and other options for treatment, the patient has consented to  Procedure(s): ?CORONARY STENT INTERVENTION (N/A) as a surgical intervention.  The patient's history has been reviewed, patient examined, no change in status, stable for surgery.  I have reviewed the patient's chart and labs.  Questions were answered to the patient's satisfaction.   ? ? ?[name] Cummings ? ? ?

## 2021-11-07 ENCOUNTER — Encounter (HOSPITAL_COMMUNITY): Payer: Self-pay | Admitting: Cardiovascular Disease

## 2021-11-07 MED FILL — Nitroglycerin IV Soln 100 MCG/ML in D5W: INTRA_ARTERIAL | Qty: 10 | Status: AC

## 2021-11-11 ENCOUNTER — Ambulatory Visit (INDEPENDENT_AMBULATORY_CARE_PROVIDER_SITE_OTHER): Payer: 59 | Admitting: Cardiology

## 2021-11-11 ENCOUNTER — Encounter: Payer: Self-pay | Admitting: Cardiology

## 2021-11-11 VITALS — BP 120/68 | HR 70 | Ht 73.5 in | Wt 247.0 lb

## 2021-11-11 DIAGNOSIS — E782 Mixed hyperlipidemia: Secondary | ICD-10-CM

## 2021-11-11 DIAGNOSIS — I25119 Atherosclerotic heart disease of native coronary artery with unspecified angina pectoris: Secondary | ICD-10-CM | POA: Diagnosis not present

## 2021-11-11 MED ORDER — NITROGLYCERIN 0.4 MG SL SUBL: 0.4000 mg | SUBLINGUAL_TABLET | SUBLINGUAL | 3 refills | Status: DC | PRN

## 2021-11-11 NOTE — Progress Notes (Signed)
? ? ? ?Clinical Summary ?[name] Cummings is a 60 y.o.male seen today for follow up of the following medical problems ?  ?History is obtained primarily from interview of his wife. Due to patient's history of anoxic brain injury and severe short term memory deficit ?  ?1. CAD ?- hx of BMS to LAD in 2007, followed by stent thrombosis with resulting VF arrest. LAD was restented at that time. ?- cath 2010 LAD 50% prox, 30% ISR in mid LAD, distal LAD 70-80%, LCX 40% ostial, 70-80% narrowing proximal portion LCX in the ramus Chasitee Zenker, RCA 70-80% proximal. LVEF 45%, the distal LAD was stented.   ?- cath Jan 2017 severe ISR of LAD stent, received another DES. Small OM1 subtotally occluded with left to left collaterals ?- echo Jan 2016 LVEF 60-65% ?- seen in [facility] 08/2015 with atypical chest pain, no evidence of ACS. Imdur was added.  ?  ?  ?  ?04/2017 nuclear stress: anterolateral infarct with mild peri-infarct ischemia. Large lateral/inferolateral/inferior/inferioapical defect with moderate ischemia. LVEF 30-44% by nuclear.  ?- 04/2017 echo LVEF 50-55%, multiple WMAs ?  ?-04/2017 cath severe LCX ostial disease, received DES. Moderate ISR of LAD stent. Severe ostial disease of small nondominant RCA.  ?  ?  ?07/2018 echo LVEF 55-60%, apex hypokinetic ?- 05/2019 nuclear stress high degree of ischemia ?  ?- 05/2019 cath as reported below, received DES to prox LAD ?- given stent burden and prior stent thrombosis plan for indefinitie DAPT ?  ?  ?  ?Jan 2022 lexiscan: Large, moderate intensity, apical to basal anterolateral defect that is partially reversible and consistent with ischemia. ?- chest pains improved after stress test and decided to monitor clinically ?- recently wife reports patient with increased SOB/DOE, increased chest pains. Occurring daily now ?- compliant with meds ?  ?  ? 09/2021 cath: LM 75%, prox to mid LAD 50% and 75%, LCX patent, OM1 99% chronic, ostial RCA 75%. Given poor cognitive status and based on  discussions with family elected for PCI as opposed to CABG ?- 11/06/21: DES to distal LM and ostial LAD ?- 09/2021 echo: LVEF 55-60%, apical akinesis.  ?Plans for lifelong DAPT given stent burden and also LM stent ? ?- still some chest pains at times, left pectoral that has been chronic MSK pain for years. . No SOB/DOE.  ?  ?  ?2. Short term memory deficit ?- chronic issue ever since prior cardiac arrest. Patient is not able to give description of symptoms, symptoms are all relayed through his wife.  ?  ?3. Hyperlipidemia ?- did not tolerate crestor or lipitor ?- has been on pravastatin, then repathat added ?  ?  ?10/2020 TC 115 TG 90 HDL 42 LDL 115 ? 05/2021 TC 148 TG 94 HDL 49 LDL 84 ?Jan LDL through insurance 37 ?- troubles with cost of zetia ? ?  ?SH:  ?Wife works [employer]. ?Wifes mother passed Jun 03 2020 with COVID.  ?Past Medical History:  ?Diagnosis Date  ? Anxiety   ? Brain anoxic injury (HCC)   ? a. 2007 in setting of VF arrest.  ? CAD (coronary artery disease)   ? a. s/p BMS to LAD in 2007 with stent thrombosis and VF arrest and LAD re-stented b. DES to distal LAD in 2010 c. ISR and DES to LAD in 2017 d. DES to LCx in 04/2017 and severe ostial stenosis of small RCA e. 05/2019: DES to proximal LAD.   ? CHF (congestive heart failure) (HCC)   ?  Depression   ? Dizziness   ? Dysphagia, unspecified(787.20)   ? GERD (gastroesophageal reflux disease)   ? History of seizure disorder   ? Hyperlipidemia, mixed   ? Hypertension   ? Hypertensive heart disease   ? unspecified  ? Ischemic cardiomyopathy   ? a. 06/2014 Echo: EF 60-65%.  ? Memory loss   ? a. since VF arrest and anoxic brain injury in 2007.  ? Nephrolithiasis   ? Ventricular fibrillation (HCC)   ? a. 2007->VF Arrest.  ? ? ? ?Allergies  ?Allergen Reactions  ? Sulfamethoxazole-Trimethoprim Rash and Other (See Comments)  ?  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 Crestor [Rosuvastatin Calcium] Other (See Comments)  ?  Myalgias  ? Hydromorphone Hcl Other (See Comments)  ?  Left side went numb  ? Lorazepam Other (See Comments)  ?  Hallucinations   ? Pseudoephedrine Other (See Comments)  ?  Caused a seizure  ? Terfenadine Other (See Comments)  ?  Reaction not known  ? Atorvastatin Other (See Comments)  ?  Muscle pain  ? Biaxin [Clarithromycin] Rash  ?  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 ? ? ?Current Outpatient Medications  ?Medication Sig Dispense Refill  ? aspirin EC 81 MG tablet Take 81 mg by mouth daily.    ? busPIRone (BUSPAR) 15 MG tablet Take 15 mg by mouth 3 (three) times daily.    ? carvedilol (COREG) 25 MG tablet Take 1 tablet (25 mg total) 2 (two) times daily with a meal by mouth. 180 tablet 3  ? clopidogrel (PLAVIX) 75 MG tablet Take 1 tablet (75 mg total) daily by mouth. Same day PCI 90 tablet 3  ? Evolocumab (REPATHA SURECLICK) 140 MG/ML SOAJ Inject 140 mg into the skin every 14 (fourteen) days. 2 mL 11  ? ezetimibe (ZETIA) 10 MG tablet Take 1 tablet (10 mg total) by mouth daily. (Patient not taking: Reported on 10/17/2021) 90 tablet 3  ? FLUoxetine (PROZAC) 20 MG capsule Take 20 mg by mouth in the morning.    ? fluticasone (FLONASE) 50 MCG/ACT nasal spray Place 1 spray into both nostrils daily as needed for allergies.     ? HYDROcodone-acetaminophen (NORCO) 7.5-325 MG tablet Take 1 tablet by mouth 6 (six) times daily.    ? loratadine (CLARITIN) 10 MG tablet Take 10 mg by mouth in the morning.    ? nitroGLYCERIN (NITROSTAT) 0.4 MG SL tablet Place 1 tablet (0.4 mg total) every 5 (five) minutes as needed under the tongue for chest pain. 25 tablet 3  ? oxybutynin (DITROPAN) 5 MG tablet Take 5 mg by mouth in the morning and at bedtime.    ? pantoprazole (PROTONIX) 40 MG tablet Take 1 tablet  (40 mg total) daily by mouth. 90 tablet 3  ? pravastatin (PRAVACHOL) 80 MG tablet Take 1 tablet (80 mg total) by mouth every evening. 90 tablet 3  ? ranolazine (RANEXA) 500 MG 12 hr tablet Take 1 tablet (500 mg total) by mouth 2 (two) times daily. (Patient not taking: Reported on 10/17/2021) 180 tablet 3  ? VENTOLIN HFA 108 (90 Base) MCG/ACT inhaler Inhale 1-2 puffs into the lungs every 6 (six) hours as needed for wheezing or shortness of breath.     ?  Vitamin D, Ergocalciferol, (DRISDOL) 1.25 MG (50000 UT) CAPS capsule Take 50,000 Units by mouth every Tuesday.    ? ?No current facility-administered medications for this visit.  ? ? ? ?Past Surgical History:  ?Procedure Laterality Date  ? bedside tracheostomy    ? with #6 Shiley. 04/14/2006  ? CARDIAC CATHETERIZATION  09/2005  ? with bare metal stent to the mid LAD (Minivision 2.5 x 18 mm) with Provisional balloon angioplasty to the second diagonal.  ? CARDIAC CATHETERIZATION  03/2006  ? with stenting of the mid LAD with Taxus 2.5 x 20 mm and 2.5 x 8 mm stents.      ? CARDIAC CATHETERIZATION N/A 07/20/2015  ? Procedure: Left Heart Cath and Coronary Angiography;  Surgeon: Corky CraftsJayadeep S Varanasi, MD;  [facility];  Service: Cardiovascular;  Laterality: N/A;  ? CARDIAC CATHETERIZATION N/A 07/20/2015  ? Procedure: Coronary Stent Intervention;  Surgeon: Corky CraftsJayadeep S Varanasi, MD;  [facility];  Service: Cardiovascular;  Laterality: N/A;  ? CORONARY ANGIOGRAPHY N/A 11/06/2021  ? Procedure: CORONARY ANGIOGRAPHY;  Surgeon: [name], [name], MD;  [facility];  Service: Cardiovascular;  Laterality: N/A;  ? CORONARY STENT INTERVENTION N/A 05/15/2017  ? Procedure: CORONARY STENT INTERVENTION;  Surgeon: [name], [name], MD;  [facility];  Service: Cardiovascular;  Laterality: N/A;  ? CORONARY STENT INTERVENTION N/A 05/27/2019  ? Procedure: CORONARY STENT INTERVENTION;  Surgeon: [name], [name], MD;  [facility];  Service: Cardiovascular;  Laterality: N/A;  ? CORONARY STENT INTERVENTION N/A 11/06/2021  ? Procedure: CORONARY STENT INTERVENTION;  Surgeon: [name], [name], MD;  [facility];  Service: C

## 2021-11-11 NOTE — Patient Instructions (Signed)
Medication Instructions:  Your physician recommends that you continue on your current medications as directed. Please refer to the Current Medication list given to you today.   Labwork: None today  Testing/Procedures: None today  Follow-Up: 6 months  Any Other Special Instructions Will Be Listed Below (If Applicable).  If you need a refill on your cardiac medications before your next appointment, please call your pharmacy.  

## 2022-04-28 ENCOUNTER — Telehealth: Payer: Self-pay | Admitting: Cardiology

## 2022-04-28 NOTE — Telephone Encounter (Signed)
   Pre-operative Risk Assessment    Patient Name: [name] Cummings  DOB: [birth year] MRN: [id number]      Request for Surgical Clearance    Procedure:   Colonoscopy w/Propofol  Date of Surgery:  Clearance 08/22/22                                 Surgeon:  Tory Emerald. Benson Norway, MD Surgeon's Group or Practice Name:  [facility] [place], [place] Phone number:  [phone] Fax number:  [phone]   Type of Clearance Requested:   - Medical  - Pharmacy:  Hold Clopidogrel (Plavix)     Type of Anesthesia:  Not Indicated   Additional requests/questions:    Louretta Shorten   04/28/2022, 10:31 AM

## 2022-05-06 ENCOUNTER — Telehealth: Payer: Self-pay | Admitting: Pharmacist Clinician (PhC)/ Clinical Pharmacy Specialist

## 2022-05-06 MED ORDER — REPATHA SURECLICK 140 MG/ML ~~LOC~~ SOAJ: 140.0000 mg | SUBCUTANEOUS | 3 refills | Status: DC

## 2022-05-06 NOTE — Telephone Encounter (Signed)
Repatha renewal PA sent and approved to 06/29/2022  Key [id number]

## 2022-05-08 NOTE — Telephone Encounter (Signed)
Primary Cardiologist:Branch, Christiane Ha, MD  Chart reviewed as part of pre-operative protocol coverage. Because of [name] Cummings past medical history and time since last visit, he/she will require a follow-up visit in order to better assess preoperative cardiovascular risk.  Pre-op covering staff: - Patient has an appointment with Dr. Wyline Mood on 05/21/22. Documentation has been made in appointment notes with request to address surgical clearance.  - Please contact requesting surgeon's office via preferred method (i.e, phone, fax) to inform them of need for appointment prior to surgery.  If applicable, this message will also be routed to pharmacy pool and/or primary cardiologist for input on holding anticoagulant/antiplatelet agent as requested below so that this information is available at time of patient's appointment.   Levi Aland, NP-C  05/08/2022, 12:28 PM [address] Office [phone] Fax [phone]

## 2022-05-21 ENCOUNTER — Ambulatory Visit: Payer: 59 | Attending: Cardiology | Admitting: Cardiology

## 2022-05-21 ENCOUNTER — Encounter: Payer: Self-pay | Admitting: Cardiology

## 2022-05-21 VITALS — BP 102/74 | HR 64 | Ht 73.5 in | Wt 245.0 lb

## 2022-05-21 DIAGNOSIS — Z01810 Encounter for preprocedural cardiovascular examination: Secondary | ICD-10-CM

## 2022-05-21 DIAGNOSIS — E782 Mixed hyperlipidemia: Secondary | ICD-10-CM

## 2022-05-21 DIAGNOSIS — I25119 Atherosclerotic heart disease of native coronary artery with unspecified angina pectoris: Secondary | ICD-10-CM | POA: Diagnosis not present

## 2022-05-21 MED ORDER — REPATHA SURECLICK 140 MG/ML ~~LOC~~ SOAJ: 140.0000 mg | SUBCUTANEOUS | 3 refills | Status: DC

## 2022-05-21 NOTE — Telephone Encounter (Signed)
Seen in clinic today. Given right around 6 months since left main stenting and colonscopy is electrive I would wait until 10/2022 before having procedure. If procedure becomes more urgent can reconsider, but for elective colonoscopy given location of stent would not take risk of holding early  Dominga Ferry MD

## 2022-05-21 NOTE — Progress Notes (Signed)
Clinical Summary [name] is a 61 y.o.male seen today for follow up of the following medical problems   History is obtained primarily from interview of his wife. Due to patient's history of anoxic brain injury and severe short term memory deficit   1. CAD - hx of BMS to LAD in 2007, followed by stent thrombosis with resulting VF arrest. LAD was restented at that time. - cath 2010 LAD 50% prox, 30% ISR in mid LAD, distal LAD 70-80%, LCX 40% ostial, 70-80% narrowing proximal portion LCX in the ramus Phynix Horton, RCA 70-80% proximal. LVEF 45%, the distal LAD was stented.   - cath Jan 2017 severe ISR of LAD stent, received another DES. Small OM1 subtotally occluded with left to left collaterals - echo Jan 2016 LVEF 60-65% - seen in [facility] 08/2015 with atypical chest pain, no evidence of ACS. Imdur was added.        04/2017 nuclear stress: anterolateral infarct with mild peri-infarct ischemia. Large lateral/inferolateral/inferior/inferioapical defect with moderate ischemia. LVEF 30-44% by nuclear.  - 04/2017 echo LVEF 50-55%, multiple WMAs   -04/2017 cath severe LCX ostial disease, received DES. Moderate ISR of LAD stent. Severe ostial disease of small nondominant RCA.      07/2018 echo LVEF 55-60%, apex hypokinetic - 05/2019 nuclear stress high degree of ischemia   - 05/2019 cath as reported below, received DES to prox LAD - given stent burden and prior stent thrombosis plan for indefinitie DAPT       Jan 2022 lexiscan: Large, moderate intensity, apical to basal anterolateral defect that is partially reversible and consistent with ischemia. - chest pains improved after stress test and decided to monitor clinically - recently wife reports patient with increased SOB/DOE, increased chest pains. Occurring daily now - compliant with meds      09/2021 cath: LM 75%, prox to mid LAD 50% and 75%, LCX patent, OM1 99% chronic, ostial RCA 75%. Given poor cognitive status and based on  discussions with family elected for PCI as opposed to CABG - 11/06/21: DES to distal LM and ostial LAD - 09/2021 echo: LVEF 55-60%, apical akinesis.  *Plans for lifelong DAPT given stent burden and also LM stent    - no recent chest pains, no SOB/DOE - compliant with meds     2. 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 Patient is not able to give description of symptoms, symptoms are all relayed through his wife.    3. Hyperlipidemia - did not tolerate crestor or lipitor - has been on pravastatin, then repathat added     - ran out of repatha needs, refill  4.Preop evaluation - patient needs routine colonoscopy     SH:  Wife works [employer]. Wifes mother passed Jun 03 2020 with COVID.  Past Medical History:  Diagnosis Date   Anxiety    Brain anoxic injury ([place])    a. 2007 in setting of VF arrest.   CAD (coronary artery disease)    a. s/p BMS to LAD in 2007 with stent thrombosis and VF arrest and LAD re-stented b. DES to distal LAD in 2010 c. ISR and DES to LAD in 2017 d. DES to LCx in 04/2017 and severe ostial stenosis of small RCA e. 05/2019: DES to proximal LAD.    CHF (congestive heart failure) (HCC)    Depression    Dizziness    Dysphagia, unspecified(787.20)    GERD (gastroesophageal reflux disease)  History of seizure disorder    Hyperlipidemia, mixed    Hypertension    Hypertensive heart disease    unspecified   Ischemic cardiomyopathy    a. 06/2014 Echo: EF 60-65%.   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     Allergies  Allergen Reactions   Sulfamethoxazole-Trimethoprim Rash and Other (See Comments)    Sweet's syndrome (Sweet's syndrome is an uncommon skin condition marked by a distinctive eruption of tiny bumps that enlarge and are often tender to the touch.)   Crestor [Rosuvastatin Calcium] Other (See  Comments)    Myalgias   Hydromorphone Hcl Other (See Comments)    Left side went numb   Lorazepam Other (See Comments)    Hallucinations    Pseudoephedrine Other (See Comments)    Caused a seizure   Terfenadine Other (See Comments)    Reaction not known   Atorvastatin Other (See Comments)    Muscle pain   Biaxin [Clarithromycin] Rash    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     busPIRone (BUSPAR) 15 MG tablet Take 15 mg by mouth 3 (three) times daily.     carvedilol (COREG) 25 MG tablet Take 1 tablet (25 mg total) 2 (two) times daily with a meal by mouth. 180 tablet 3   clopidogrel (PLAVIX) 75 MG tablet Take 1 tablet (75 mg total) daily by mouth. Same day PCI 90 tablet 3   Evolocumab (REPATHA SURECLICK) [id number] MG/ML SOAJ Inject 140 mg into the skin every 14 (fourteen) days. 6 mL 3   ezetimibe (ZETIA) 10 MG tablet Take 1 tablet (10 mg total) by mouth daily. (Patient not taking: Reported on 10/17/2021) 90 tablet 3   FLUoxetine (PROZAC) 20 MG capsule Take 40 mg by mouth in the morning.     fluticasone (FLONASE) 50 MCG/ACT nasal spray Place 1 spray into both nostrils daily as needed for allergies.      HYDROcodone-acetaminophen (NORCO) 7.5-325 MG tablet Take 1 tablet by mouth 6 (six) times daily.     loratadine (CLARITIN) 10 MG tablet Take 10 mg by mouth in the morning.     nitroGLYCERIN (NITROSTAT) 0.4 MG SL tablet Place 1 tablet (0.4 mg total) under the tongue every 5 (five) minutes as needed for chest pain. 25 tablet 3   oxybutynin (DITROPAN) 5 MG tablet Take 5 mg by mouth in the morning and at bedtime.     pantoprazole (PROTONIX) 40 MG tablet Take 1 tablet (40 mg total) daily by mouth. 90 tablet 3   pravastatin (PRAVACHOL) 80 MG  tablet Take 1 tablet (80 mg total) by mouth every evening. 90 tablet 3   ranolazine (RANEXA) 500 MG 12 hr tablet Take 1 tablet (500 mg total) by mouth 2 (two) times daily. (Patient not taking: Reported on 10/17/2021) 180 tablet 3   VENTOLIN HFA 108 (90 Base) MCG/ACT inhaler Inhale 1-2 puffs into the lungs every 6 (six) hours as needed for wheezing or shortness of breath.      Vitamin D, Ergocalciferol, (DRISDOL) 1.25 MG (50000 UT) CAPS capsule Take 50,000 Units by mouth  every Tuesday.     No current facility-administered medications for this visit.     Past Surgical History:  Procedure Laterality Date   bedside tracheostomy     with #6 [place]. 04/14/2006   CARDIAC CATHETERIZATION  09/2005   with bare metal stent to the mid LAD (Minivision 2.5 x 18 mm) with Provisional balloon angioplasty to the second diagonal.   CARDIAC CATHETERIZATION  03/2006   with stenting of the mid LAD with Taxus 2.5 x 20 mm and 2.5 x 8 mm stents.       CARDIAC CATHETERIZATION N/A 07/20/2015   Procedure: Left Heart Cath and Coronary Angiography;  Surgeon: [name], MD;  Location: [facility];  Service: Cardiovascular;  Laterality: N/A;   CARDIAC CATHETERIZATION N/A 07/20/2015   Procedure: Coronary Stent Intervention;  Surgeon: [name], MD;  Location: [facility];  Service: Cardiovascular;  Laterality: N/A;   CORONARY ANGIOGRAPHY N/A 11/06/2021   Procedure: CORONARY ANGIOGRAPHY;  Surgeon: [name], MD;  Location: [facility];  Service: Cardiovascular;  Laterality: N/A;   CORONARY STENT INTERVENTION N/A 05/15/2017   Procedure: CORONARY STENT INTERVENTION;  Surgeon: [name], MD;  Location: [facility];  Service: Cardiovascular;  Laterality: N/A;   CORONARY STENT INTERVENTION N/A 05/27/2019   Procedure: CORONARY STENT INTERVENTION;  Surgeon: [name], MD;  Location: [place] [facility];  Service: Cardiovascular;  Laterality: N/A;   CORONARY STENT  INTERVENTION N/A 11/06/2021   Procedure: CORONARY STENT INTERVENTION;  Surgeon: [name], MD;  Location: [facility];  Service: Cardiovascular;  Laterality: N/A;   CORONARY STENT PLACEMENT  07/20/2015   LAD with DES   ESOPHAGOGASTRODUODENOSCOPY     KIDNEY STONE SURGERY     LEFT HEART CATH AND CORONARY ANGIOGRAPHY N/A 05/15/2017   Procedure: LEFT HEART CATH AND CORONARY ANGIOGRAPHY;  Surgeon: [name], MD;  Location: [place] [facility];  Service: Cardiovascular;  Laterality: N/A;   LEFT HEART CATH AND CORONARY ANGIOGRAPHY N/A 05/27/2019   Procedure: LEFT HEART CATH AND CORONARY ANGIOGRAPHY;  Surgeon: [name], MD;  Location: [facility] [facility];  Service: Cardiovascular;  Laterality: N/A;   LEFT HEART CATH AND CORONARY ANGIOGRAPHY N/A 10/21/2021   Procedure: LEFT HEART CATH AND CORONARY ANGIOGRAPHY;  Surgeon: Nelva Bush, MD;  Location: [facility] [facility];  Service: Cardiovascular;  Laterality: N/A;   PEG TUBE PLACEMENT     PEG TUBE REMOVAL     stent thrombosis  [id number]   Complicated my sudden cardiac death   TRACHEOSTOMY CLOSURE       Allergies  Allergen Reactions   Sulfamethoxazole-Trimethoprim Rash and Other (See Comments)    Sweet's syndrome (Sweet's syndrome is an uncommon skin condition marked by a distinctive eruption of tiny bumps that enlarge and are often tender to the touch.)   Crestor [Rosuvastatin Calcium] Other (See Comments)    Myalgias   Hydromorphone Hcl Other (See Comments)    Left side went numb   Lorazepam Other (See Comments)    Hallucinations    Pseudoephedrine Other (See Comments)    Caused a seizure   Terfenadine Other (See Comments)    Reaction not known   Atorvastatin Other (See Comments)    Muscle pain   Biaxin [Clarithromycin] Rash    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 Sabatini reports that he has never smoked. He quit smokeless tobacco use about 16 years ago.  His smokeless tobacco use included chew. Mr. Chapmon reports no history of alcohol use.   Review of Systems CONSTITUTIONAL: No weight loss, fever, chills, weakness or fatigue.  HEENT: Eyes: No visual loss, blurred vision, double vision or yellow sclerae.No hearing loss, sneezing, congestion, runny nose or sore throat.  SKIN: No rash or itching.  CARDIOVASCULAR: per hpi RESPIRATORY: No shortness of breath, cough or sputum.  GASTROINTESTINAL: No anorexia, nausea, vomiting or diarrhea. No abdominal pain or blood.  GENITOURINARY: No burning on urination, no polyuria NEUROLOGICAL: No headache, dizziness, syncope, paralysis, ataxia, numbness or tingling in the extremities. No change in bowel or bladder control.  MUSCULOSKELETAL: No muscle, back pain, joint pain or stiffness.  LYMPHATICS: No enlarged nodes. No history of splenectomy.  PSYCHIATRIC: No history of depression or anxiety.  ENDOCRINOLOGIC: No reports of sweating, cold or heat intolerance. No polyuria or polydipsia.  Marland Kitchen   Physical Examination Today's Vitals   05/21/22 0844  BP: 102/74  Pulse: 64  Weight: 245 lb (111.1 kg)  Height: 6' 1.5" (1.867 m)   Body mass index is 31.89 kg/m.  Gen: resting comfortably, no acute distress HEENT: no scleral icterus, pupils equal round and reactive, no palptable cervical adenopathy,  CV: RRR, no m/r/g no jvd Resp: Clear to auscultation bilaterally GI: abdomen is soft, non-tender, non-distended, normal bowel sounds, no hepatosplenomegaly MSK: extremities are warm, no edema.  Skin: warm, no rash Neuro:  no focal  deficits Psych: appropriate affect   Diagnostic Studies  11/2008 Cath RESULTS:  Left main coronary artery:  The left main coronary artery was  free of significant disease.    Left anterior descending:  The left anterior descending artery gave rise  to small diagonal Nettye Flegal and small septal perforator, and larger  diagonal Monik Lins.  There was 30% narrowing within the midportion of the  previously placed stents.  Distal to the stent, there was an eccentric  lesion, which appeared to be a ruptured plaque, which was estimated to  be 70-80% obstructive.  There also was 40-50% diffuse narrowing in the  proximal LAD.    Circumflex artery:  The circumflex artery gave rise to a large ramus  Bernarr Longsworth and two posterolateral branches and a posterior descending  Destyn Schuyler.  This was a dominant vessel.  There was 70-80% narrowing in the  proximal portion in the ramus Aretta Stetzel which was extended over about 18  mm.  There was also a 40% ostial narrowing in the circumflex artery and  50% narrowing in the ostium of the ramus Lonnie Rosado.    Right coronary artery:  The right coronary artery was a nondominant  vessel which supplied only right ventricular branches.  There was a 70-  80% proximal stenosis.    Left ventriculogram:  The left ventriculogram was performed in the RAO  projection showed hypokinesis of the anterolateral wall and apex.  The  estimated fraction was 45%.    Following stenting of the lesion in the mid LAD, the stenosis improved  from 80% to 0%.    CONCLUSIONS:  1. Coronary artery disease, status post previous percutaneous coronary      interventions as described above with 40% narrowing in the proximal      LAD, 30% narrowing within the stent in the mid LAD, and 80%      narrowing in the mid LAD after the stent, 70-80% narrowing in the      large ramus Suriah Peragine of the circumflex artery with 40% ostial      stenosis in the dominant circumflex artery, and 80% narrowing in      the proximal  portion of a nondominant right coronary artery with      anterolateral wall and apical wall hypokinesis and estimated      ejection fraction of 45%.  2. Successful PCI of the lesion in the mid LAD distal to the      previously placed stent using a XIENCE drug-eluting stent with      improvement in center narrowing from 80% to 0%.    DISPOSITION:  The patient returned to [facility] room for further  observation.       Jan 2016 echo Study Conclusions  - Left ventricle: The cavity size was normal. Wall thickness was   normal. Systolic function was normal. The estimated ejection   fraction was in the range of 60% to 65%. Indeterminant diastolic   function. - Aortic valve: Mildly calcified annulus. Mildly thickened   leaflets. - Mitral valve: Mildly calcified annulus. Mildly thickened leaflets   . - Left atrium: The atrium was mildly dilated. - Systemic veins: IVC is small, suggestive of low RA pressure and   hypovolemia. - Technically adequate study.     07/10/15 Clinic EKG (performed and reviewed in clinic): Sinus bradycardia   Jan 2017 [facility]   Perfusion Summary Defect 1:  There is a large defect of moderate severity present in the basal anterolateral, mid anterolateral and apical lateral location. The defect is partially reversible. Large, moderate intensity, partially reversible anterolateral defect consistent with scar and at least moderate peri-infarct ischemia.      Overall Study Impression Myocardial perfusion is abnormal. This is a high risk study. Overall left ventricular systolic function was abnormal. Nuclear stress EF: 43%.         Jan 2017 cath OM1 subtotally occluded with left to left collaterals coming from the apical LAD. Severe in-stent restenosis of prior bare-metal stent which was placed in 2007 in the setting of his cardiac arrest. This was treated with cutting balloon angioplasty anddrug-eluting stent placement with a 2.5 x 28 Synergy drug-eluting  stent, postdilatto greater than 3 m   Continue dual antiplatelet therapy indefinitely. The patient has significant memory loss after his cardiac arrest. Will discuss with his wife about the importance of staying on his dual antiplatelet therapy.    He'll be watched overnight with anticipated discharge tomorrow   04/2017 Nuclear stress There was no ST segment deviation noted during stress. Findings consistent with prior anterolateral myocardial infarction with mild peri-infarct ischemia. Large lateral/inferolatera/inferior/inferoapical defect with moderate ischemia This is a high risk study. High risk due to low ejection fraction and multiple areas of ischemia. Mild area of ischemia anterolateral. Moderate area of ischemia lateral/inferior walls The left ventricular ejection fraction is moderately decreased (30-44%).     04/2017 cath 1.  Severe ostial left circumflex stenosis treated successfully with drug-eluting stent implantation (3.5 x 28 mm Sierra DES postdilated with a 4.0 mm noncompliant balloon) 2.  Moderate  diffuse in-stent restenosis of the LAD 3.  Severe stenosis of the RCA ostium (nondominant vessel) 4.  Normal LVEDP   Continue long-term dual antiplatelet therapy with aspirin and clopidogrel.  As long as no complications arise patient is eligible for same day discharge.   04/2017 echo Study Conclusions   - Left ventricle: The cavity size was normal. Wall thickness was   increased in a pattern of mild LVH. Systolic function was normal.   The estimated ejection fraction was in the range of 50% to 55%.   Left ventricular diastolic function parameters were normal. - Regional wall motion abnormality: Hypokinesis of the apical   anterior, apical inferior, and apical myocardium. - Aortic valve: Valve area (VTI): 3.01 cm^2. Valve area (Vmax):   3.29 cm^2. - Technically difficult study.   07/2018 echo IMPRESSIONS      1. The left ventricle has normal systolic function, with an  ejection fraction of 55-60%. The cavity size was normal. There is mildly increased left ventricular wall thickness. Left ventricular diastolic parameters were normal.The apex is hypokinetic  2. The right ventricle has normal systolic function. The cavity was normal. There is no increase in right ventricular wall thickness.  3. Left atrial size was mildly dilated.  4. Small pericardial effusion.  5. The mitral valve is normal in structure. No evidence of mitral valve stenosis.  6. The tricuspid valve is normal in structure.  7. The aortic valve is tricuspid no stenosis of the aortic valve.  8. The aortic root is normal in size and structure.  9. Pulmonary hypertension is indeterminant, inadequate TR jet. 10. Right atrial pressure is estimated at 3 mmHg.   05/2019 nuclear stress There was no ST segment deviation noted during stress. Findings consistent with prior anterolateral/apical myocardial infarction with high level of peri-infarct ischemia. The left ventricular ejection fraction is normal (55-65%). High risk study based on degree of current ischemia.       05/2019 cath Severe calcific proximal LAD stenosis and moderate stenosis at the edge of the previously implanted mid LAD stent 2.  Continued patency of the stented segment in the proximal circumflex 3.  Chronic subtotal occlusion of the first OM Mansel Strother of the circumflex, unchanged from previous studies 4.  Chronic occlusion of the diagonal Arion Morgan of the LAD collateralized from the apical LAD, also unchanged from previous studies 5.  Normal LVEDP 6. Successful PCI of the proximal LAD using a 2.75x26 mm Orsiro DES   Recommendations: Continue long-term dual antiplatelet therapy with aspirin and clopidogrel, aggressive medical therapy, candidate for same-day PCI discharge as long as no early complications arise.   09/2021 cath Conclusions: Significant stenoses of up to 75% involving distal LMCA, ostial LAD and mid LAD (beyond  previously stented segment). Chronic subtotal occlusion of OM1. Widely patent stent in the ostial/proximal LCx, which extends back into the distal LMCA. Low normal left ventricular contraction with subtle apical hypokinesis (LVEF 50-55%). Mildly elevated left ventricular filling pressure (LVEDP 21 mmHg).   Recommendations: Images reviewed with Dr. Burt Knack and case discussed with Mr. Fazzino and his wife.  His recurrent dyspnea and chest pain are most likely related to recurrent CAD involving the LAD and now extending into the distal LMCA.  We have all agreed that he is not a good candidate for CABG given his remote VF arrest with continued memory deficits from anoxic brain injury.  We will plan for PCI to unprotected LMCA and LAD next week. Obtain echocardiogram to better assess LVEF and exclude  underlying valvular abnormalities that could affect high-risk PCI. Continue indefinite DAPT with aspirin and clopidogrel. Aggressive secondary prevention of coronary artery disease.     Assessment and Plan   1. CAD with unspecified angina - evaluation has always been complicated by his severe memory deficit/prior anoxic brain injury as well as chronic left sided chest pain combined with his extensive prior history of CAD -recent LM/LAD PCI as reported - doing well, continue current meds. Plans for lifelong DAPT     2. Hyperlipidemia - difficulties tolerating crestor and lipitor, on pravatstatin and started on repatha. Issues with cost of zetia - needs refill on repatha  3. Preoperative evaluation - plans for colonscopy, if elective would wait until 10/2022. At that time will have been a year since his last stent. Would not want to hold plavix earlier unless aboslutely neccesary    F/u 6 months    Antoine Poche, M.D.

## 2022-05-21 NOTE — Patient Instructions (Signed)
Medication Instructions:  Your physician recommends that you continue on your current medications as directed. Please refer to the Current Medication list given to you today.   Labwork: None  Testing/Procedures: None  Follow-Up: Follow up with Dr. Branch in 6 months.   Any Other Special Instructions Will Be Listed Below (If Applicable).     If you need a refill on your cardiac medications before your next appointment, please call your pharmacy.  

## 2022-05-26 NOTE — Telephone Encounter (Signed)
   Name: [name] Cummings  DOB: 01-04-1961  MRN: [id number]   Primary Cardiologist: Dina Rich, MD  Chart reviewed as part of pre-operative protocol coverage.   Pt underwent PCI with DES to LAD in 10/2021. Pt remains on DAPT with ASA and plavix for 12 months.  Per Dr. Wyline Mood: Patient is planning for upcoming colonoscopy. If this procedure is elective, recommend waiting until 10/2022. At that time will have been a year since his last stent (left main). Would not want to hold plavix earlier unless aboslutely neccessary.  Please contact our office closer to the rescheduled date for updated recommendations.  I will route this recommendation to the requesting party via Epic fax function and remove from pre-op pool. Please call with questions.  Roe Rutherford Izela Altier, PA 05/26/2022, 9:11 AM

## 2022-05-27 ENCOUNTER — Telehealth: Payer: Self-pay | Admitting: Pharmacist

## 2022-05-27 ENCOUNTER — Other Ambulatory Visit: Payer: Self-pay | Admitting: Pharmacist

## 2022-05-27 MED ORDER — REPATHA SURECLICK 140 MG/ML ~~LOC~~ SOAJ: 140.0000 mg | SUBCUTANEOUS | 3 refills | Status: DC

## 2022-05-27 NOTE — Telephone Encounter (Signed)
Repatha PA request received from pharmacy. Looks like PA was just approved through 06/29/22 but submitted PA anyway Key: [facility]

## 2022-05-27 NOTE — Telephone Encounter (Signed)
Approved through 05/28/23

## 2022-06-17 ENCOUNTER — Other Ambulatory Visit: Payer: Self-pay | Admitting: Gastroenterology

## 2022-08-07 ENCOUNTER — Ambulatory Visit (INDEPENDENT_AMBULATORY_CARE_PROVIDER_SITE_OTHER): Payer: 59 | Admitting: Neurology

## 2022-08-07 ENCOUNTER — Encounter: Payer: Self-pay | Admitting: Neurology

## 2022-08-07 VITALS — BP 127/74 | HR 60 | Ht 73.0 in | Wt 248.0 lb

## 2022-08-07 DIAGNOSIS — G931 Anoxic brain damage, not elsewhere classified: Secondary | ICD-10-CM

## 2022-08-07 DIAGNOSIS — R4789 Other speech disturbances: Secondary | ICD-10-CM

## 2022-08-07 NOTE — Progress Notes (Addendum)
Chief Complaint  Patient presents with   New Patient (Initial Visit)    Rm 13. Patient is with daughter, and wife. Family has concerns with memory, more so recently. Speech and memory have declined.       ASSESSMENT AND PLAN  [name] Cummings is a 62 y.o. 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   MEDICAL HISTORY:  [name] Cummings, is a 62 year old male, accompanied by his wife, and daughter for evaluation of worsening language difficulty, seen in request by his primary care atrium at [facility] Dr. Redmond Pulling, [place] Flavors,   I saw him previously in 2017, he suffered a anoxic brain injury in 2007, had a heart attack, went into ventricular fibrillation, prolonged anoxic brain injury, had persistent memory loss ever since, also has vascular risk factor of obesity hypertension hyperlipidemia, coronary artery disease  He used to work as a Horticulturist, commercial, had 14 years of education,  MRI of the brain in March 2017, showed mild progression of generalized brain atrophy, ventriculomegaly, mild supratentorial and small vessel disease,  Previous EEG showed intermittent slowing from the left hemisphere, but no clear epileptiform discharge   At baseline, he can dress, tolerating, ambulate without difficulty, but daughter reported that he has worsening temper outburst over the  past few years, also language difficulties, stuck in the middle of the sentence, word finding difficulties, get frustrated easily, family also reported that he gets fatigued easily, takes frequent naps during the day, reported sleep study recently showed no significant abnormality  Laboratory evaluation in 2023, A1c 5.9, normal TSH, triglyceride 160, LDL 109, CMP was normal with exception of mild elevated alkaline phosphate 151, hemoglobin was mildly decreased 13.7  Personally reviewed CT head without contrast April 2022, no acute intracranial abnormality, generalized atrophy, ventriculomegaly  PHYSICAL EXAM:   Vitals:   08/07/22 1603  BP: 127/74  Pulse: 60  Weight: 248 lb (112.5 kg)  Height: 6\' 1"  (1.854 m)   Body mass index is 32.72 kg/m.  PHYSICAL EXAMNIATION:  Gen: NAD, conversant, well nourised, well groomed                      Eyes: Conjunctivae clear without exudates or hemorrhage   NEUROLOGICAL EXAM:  MENTAL STATUS: Speech/cognition: Rely on his family to provide history     08/07/2022    4:06 PM  Montreal Cognitive Assessment   Visuospatial/ Executive (0/5) 0  Naming (0/3) 1  Attention: Read list of digits (0/2) 2  Attention: Read list of letters (0/1) 1  Attention: Serial 7 subtraction starting at 100 (0/3) 0  Language: Repeat phrase (0/2) 0  Language : Fluency (0/1) 0  Abstraction (0/2) 1  Delayed Recall (0/5) 3  Orientation (0/6) 1  Total 9    CRANIAL NERVES: CN II: Visual fields are full to confrontation. Pupils are round equal and briskly reactive  to light. CN III, IV, VI: extraocular movement are normal. No ptosis. CN V: Facial sensation is intact to light touch CN VII: Face is symmetric with normal eye closure  CN VIII: Hearing is normal to causal conversation. CN IX, X: Phonation is normal. CN XI: Head turning and shoulder shrug are intact  MOTOR: Moving 4 extremities without difficulties  SENSORY: Intact to light touch,  p  COORDINATION: There is no trunk or limb dysmetria noted.  GAIT/STANCE: Get up from seated position blood pressure unclear arm, wide-based, mildly unsteady  REVIEW OF SYSTEMS:  Full 14 system review of systems performed and notable only for as above All other review of systems were negative.   ALLERGIES: Allergies  Allergen Reactions   Sulfamethoxazole-Trimethoprim Rash and Other (See Comments)    Sweet's syndrome (Sweet's syndrome is an uncommon skin condition marked by a distinctive eruption of tiny bumps that enlarge and are often tender to the touch.)   Crestor [Rosuvastatin Calcium] Other (See Comments)    Myalgias   Hydromorphone Hcl Other (See Comments)    Left side went numb   Lorazepam Other (See Comments)    Hallucinations    Pseudoephedrine Other (See Comments)    Caused a seizure   Terfenadine Other (See Comments)    Reaction not known   Atorvastatin Other (See Comments)    Muscle pain   Biaxin [Clarithromycin] Rash    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     busPIRone (BUSPAR) 15 MG tablet Take 15 mg by mouth 3 (three) times daily.     carvedilol (COREG) 25 MG tablet Take 1 tablet (25 mg total) 2 (two) times daily with a meal by mouth. 180 tablet 3   clopidogrel (PLAVIX) 75 MG tablet Take 1 tablet (75 mg total) daily by mouth. Same day PCI 90 tablet 3   Evolocumab (REPATHA SURECLICK) 062 MG/ML SOAJ Inject 140 mg into the skin every 14 (fourteen) days. 6 mL 3   FLUoxetine (PROZAC) 20 MG capsule Take 40 mg by mouth in the morning.     fluticasone (FLONASE) 50 MCG/ACT nasal spray Place 1 spray into both nostrils daily as needed for allergies.      HYDROcodone-acetaminophen (NORCO)  7.5-325 MG tablet Take 1 tablet by mouth 6 (six) times daily.     loratadine (CLARITIN) 10 MG tablet Take 10 mg by mouth in the morning.     nitroGLYCERIN (NITROSTAT) 0.4 MG SL tablet Place 1 tablet (0.4 mg total) under the tongue every 5 (five) minutes as needed for chest pain. 25 tablet 3   oxybutynin (DITROPAN) 5 MG tablet Take 5 mg by mouth in the morning and at bedtime.     pantoprazole (PROTONIX) 40 MG tablet Take 1 tablet (40 mg total) daily by mouth. 90 tablet 3   ranolazine (RANEXA) 500 MG 12 hr tablet Take 1 tablet (500 mg total) by mouth 2 (two) times daily. 180 tablet 3   VENTOLIN HFA 108 (90 Base) MCG/ACT inhaler Inhale 1-2 puffs into the lungs every 6 (six) hours as needed for wheezing or shortness of breath.      Vitamin D, Ergocalciferol, (DRISDOL) 1.25 MG (50000 UT) CAPS capsule Take 50,000 Units by mouth every  Tuesday.     ezetimibe (ZETIA) 10 MG tablet Take 1 tablet (10 mg total) by mouth daily. 90 tablet 3   pravastatin (PRAVACHOL) 80 MG tablet Take 1 tablet (80 mg total) by mouth every evening. 90 tablet 3   No current facility-administered medications for this visit.    PAST MEDICAL HISTORY: Past Medical History:  Diagnosis Date   Anxiety    Brain anoxic injury ([place])    a. 2007 in setting of VF arrest.   CAD (coronary artery disease)    a. s/p BMS to LAD in 2007 with stent thrombosis and VF arrest and LAD re-stented b. DES to distal LAD in 2010 c. ISR and DES to LAD in 2017 d. DES to LCx in 04/2017 and severe ostial stenosis of small RCA e. 05/2019: DES to proximal LAD.    CHF (congestive heart failure) (HCC)    Depression    Dizziness    Dysphagia, unspecified(787.20)    GERD (gastroesophageal reflux disease)    History of seizure disorder    Hyperlipidemia, mixed    Hypertension    Hypertensive heart disease    unspecified   Ischemic cardiomyopathy    a. 06/2014 Echo: EF 60-65%.   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    Nephrolithiasis    Ventricular fibrillation ([place])    a. 2007->VF Arrest.    PAST SURGICAL HISTORY: Past Surgical History:  Procedure Laterality Date   bedside tracheostomy     with #6 [place]. 04/14/2006   CARDIAC CATHETERIZATION  09/2005   with bare metal stent to the mid LAD (Minivision 2.5 x 18 mm) with Provisional balloon angioplasty to the second diagonal.   CARDIAC CATHETERIZATION  03/2006   with stenting of the mid LAD with Taxus 2.5 x 20 mm and 2.5 x 8 mm stents.       CARDIAC CATHETERIZATION N/A 07/20/2015   Procedure: Left Heart Cath and Coronary Angiography;  Surgeon: [name], MD;  Location: [place] [facility];  Service: Cardiovascular;  Laterality: N/A;   CARDIAC CATHETERIZATION N/A 07/20/2015   Procedure: Coronary Stent Intervention;  Surgeon: [name], MD;  Location: [place] [facility];  Service: Cardiovascular;  Laterality: N/A;   CORONARY ANGIOGRAPHY N/A 11/06/2021   Procedure: CORONARY ANGIOGRAPHY;  Surgeon: [name], MD;  Location: [place] [facility];  Service: Cardiovascular;  Laterality: N/A;   CORONARY STENT INTERVENTION N/A 05/15/2017   Procedure: CORONARY STENT INTERVENTION;  Surgeon: [name], MD;  Location: [place] [facility];  Service: Cardiovascular;  Laterality: N/A;   CORONARY STENT INTERVENTION N/A 05/27/2019   Procedure: CORONARY STENT INTERVENTION;  Surgeon: [name], MD;  Location: [place] [facility];  Service: Cardiovascular;  Laterality: N/A;   CORONARY STENT INTERVENTION N/A 11/06/2021   Procedure: CORONARY STENT INTERVENTION;  Surgeon: [name], MD;  Location: [place] [facility];  Service: Cardiovascular;  Laterality: N/A;   CORONARY STENT PLACEMENT  07/20/2015   LAD with DES   ESOPHAGOGASTRODUODENOSCOPY     KIDNEY STONE SURGERY     LEFT HEART CATH AND CORONARY ANGIOGRAPHY N/A 05/15/2017   Procedure: LEFT HEART CATH AND CORONARY ANGIOGRAPHY;  Surgeon: [name], MD;  Location: [place] [facility];   Service: Cardiovascular;  Laterality: N/A;   LEFT HEART CATH AND CORONARY ANGIOGRAPHY N/A 05/27/2019   Procedure: LEFT HEART CATH AND CORONARY ANGIOGRAPHY;  Surgeon: [name], MD;  Location: [place] [facility];  Service: Cardiovascular;  Laterality: N/A;   LEFT HEART CATH AND CORONARY ANGIOGRAPHY N/A 10/21/2021   Procedure: LEFT HEART CATH AND CORONARY ANGIOGRAPHY;  Surgeon: Nelva Bush, MD;  Location: [place] [facility];  Service: Cardiovascular;  Laterality: N/A;   PEG TUBE PLACEMENT     PEG TUBE REMOVAL     stent thrombosis  04/5725   Complicated my sudden cardiac death   TRACHEOSTOMY CLOSURE      FAMILY HISTORY: Family History  Problem Relation Age of Onset   Heart attack Mother    Diabetes Mother    Hydrocephalus Mother    Heart attack Father    Cancer Father        unsure of type   Diabetes Sister    Coronary artery disease Neg Hx        Siblings    SOCIAL HISTORY: Social History   Socioeconomic History   Marital status: Married    Spouse name: Not on file   Number of children: 4   Years of education: 12   Highest education level: Not on file  Occupational History   Occupation: Disabled  Tobacco Use   Smoking status: Never   Smokeless tobacco: Former    Types: Chew    Quit date: 09/03/2005  Vaping Use   Vaping Use: Never used  Substance and Sexual Activity   Alcohol use: No    Alcohol/week: 0.0 standard drinks of alcohol   Drug use: No   Sexual activity: Not on file  Other Topics Concern   Not on file  Social History Narrative   Lives at home with wife.   Right-handed.   One soda and tea daily.   Social Determinants of Health   Financial Resource Strain: Not on file  Food Insecurity: Not on file  Transportation Needs: Not on file  Physical Activity: Not on file  Stress: Not on file  Social Connections: Not on file  Intimate Partner Violence: Not on file      Marcial Pacas, M.D. Ph.D.  [facility] [address],  [place], [place] [address] Ph: [phone] Fax: [phone]  CC:  Debby Bud, [place] [address] [place],  [place] [address]  Christain Sacramento, MD

## 2022-08-08 LAB — IRON,TIBC AND FERRITIN PANEL
Ferritin: 152 ng/mL (ref 30–400)
Iron Saturation: 18 % (ref 15–55)
Iron: 56 ug/dL (ref 38–169)
Total Iron Binding Capacity: 308 ug/dL (ref 250–450)
UIBC: 252 ug/dL (ref 111–343)

## 2022-08-08 LAB — VITAMIN B12: Vitamin B-12: 456 pg/mL (ref 232–1245)

## 2022-08-08 LAB — RPR: RPR Ser Ql: NONREACTIVE

## 2022-08-20 ENCOUNTER — Telehealth: Payer: Self-pay | Admitting: Neurology

## 2022-08-20 NOTE — Telephone Encounter (Signed)
[employer] Josem [id number] exp. 08/20/22-10/04/22, Craig Staggers: [id number] exp. 08/20/22-09/19/22 sent to [facility] [phone]

## 2022-08-25 ENCOUNTER — Ambulatory Visit (INDEPENDENT_AMBULATORY_CARE_PROVIDER_SITE_OTHER): Payer: 59 | Admitting: Neurology

## 2022-08-25 DIAGNOSIS — G931 Anoxic brain damage, not elsewhere classified: Secondary | ICD-10-CM

## 2022-08-25 DIAGNOSIS — R4182 Altered mental status, unspecified: Secondary | ICD-10-CM | POA: Diagnosis not present

## 2022-09-03 ENCOUNTER — Ambulatory Visit (HOSPITAL_COMMUNITY)
Admission: RE | Admit: 2022-09-03 | Discharge: 2022-09-03 | Disposition: A | Discharge status: Home / Self Care | Payer: 59 | Source: Ambulatory Visit | Attending: Neurology | Admitting: Neurology

## 2022-09-03 DIAGNOSIS — G931 Anoxic brain damage, not elsewhere classified: Secondary | ICD-10-CM | POA: Insufficient documentation

## 2022-09-15 NOTE — Procedures (Signed)
   HISTORY: 62 year old male with history of anoxic brain injury, presenting with slow worsening memory loss, worsening functional status  TECHNIQUE:  This is a routine 16 channel EEG recording with one channel devoted to a limited EKG recording.  It was performed during wakefulness, drowsiness and asleep.  Hyperventilation and photic stimulation were performed as activating procedures.  There are minimum muscle and movement artifact noted.  Upon maximum arousal, posterior dominant waking rhythm consistent of mildly dysrhythmic theta range activity. Activities are symmetric over the bilateral posterior derivations and attenuated with eye opening.  Hyperventilation produced mild/moderate buildup with higher amplitude and the slower activities noted.  Photic stimulation did not alter the tracing.  During EEG recording, patient developed drowsiness and no deeper stage of sleep was achieved During EEG recording, there was no epileptiform discharge noted.  EKG demonstrate normal sinus rhythm.  CONCLUSION: This is a slight abnormal EEG.  There is  electrodiagnostic evidence of epileptiform discharge of mild background slowing, indicating mild bihemispheric malfunction.  There was no evidence of epileptiform discharge  Marcial Pacas, M.D. Ph.D.  [facility] [place], [place] [address] Phone: [phone] Fax:      [phone]

## 2022-11-03 ENCOUNTER — Ambulatory Visit: Payer: 59 | Admitting: Cardiology

## 2022-11-06 ENCOUNTER — Ambulatory Visit: Payer: 59 | Attending: Cardiology | Admitting: Cardiology

## 2022-11-06 ENCOUNTER — Encounter: Payer: Self-pay | Admitting: Cardiology

## 2022-11-06 VITALS — BP 134/75 | HR 64 | Ht 74.0 in | Wt 240.0 lb

## 2022-11-06 DIAGNOSIS — E782 Mixed hyperlipidemia: Secondary | ICD-10-CM | POA: Diagnosis not present

## 2022-11-06 DIAGNOSIS — I25119 Atherosclerotic heart disease of native coronary artery with unspecified angina pectoris: Secondary | ICD-10-CM

## 2022-11-06 NOTE — Progress Notes (Signed)
Clinical Summary [name] Cummings is a 62 y.o.male seen today for follow up of the following medical problems   History is obtained primarily from interview of his wife. Due to patient's history of anoxic brain injury and severe short term memory deficit   1. CAD - hx of BMS to LAD in 2007, followed by stent thrombosis with resulting VF arrest. LAD was restented at that time. - cath 2010 LAD 50% prox, 30% ISR in mid LAD, distal LAD 70-80%, LCX 40% ostial, 70-80% narrowing proximal portion LCX in the ramus Asna Muldrow, RCA 70-80% proximal. LVEF 45%, the distal LAD was stented.   - cath Jan 2017 severe ISR of LAD stent, received another DES. Small OM1 subtotally occluded with left to left collaterals - echo Jan 2016 LVEF 60-65% - seen in [facility] 08/2015 with atypical chest pain, no evidence of ACS. Imdur was added.        04/2017 nuclear stress: anterolateral infarct with mild peri-infarct ischemia. Large lateral/inferolateral/inferior/inferioapical defect with moderate ischemia. LVEF 30-44% by nuclear.  - 04/2017 echo LVEF 50-55%, multiple WMAs   -04/2017 cath severe LCX ostial disease, received DES. Moderate ISR of LAD stent. Severe ostial disease of small nondominant RCA.      07/2018 echo LVEF 55-60%, apex hypokinetic - 05/2019 nuclear stress high degree of ischemia   - 05/2019 cath as reported below, received DES to prox LAD - given stent burden and prior stent thrombosis plan for indefinitie DAPT       Jan 2022 lexiscan: Large, moderate intensity, apical to basal anterolateral defect that is partially reversible and consistent with ischemia. - chest pains improved after stress test and decided to monitor clinically - recently wife reports patient with increased SOB/DOE, increased chest pains. Occurring daily now - compliant with meds      09/2021 cath: LM 75%, prox to mid LAD 50% and 75%, LCX patent, OM1 99% chronic, ostial RCA 75%. Given poor cognitive status and based on  discussions with family elected for PCI as opposed to CABG - 11/06/21: DES to distal LM and ostial LAD - 09/2021 echo: LVEF 55-60%, apical akinesis.  *Plans for lifelong DAPT given stent burden and also LM stent     -no chest pains, no SOB/DOE - compliant with meds     2. 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 Patient is not able to give description of symptoms, symptoms are all relayed through his wife.    3. Hyperlipidemia - did not tolerate crestor or lipitor - has been on pravastatin, then repatha added  04/2022 TC 170 TG 160 HDL 41 LDL 109. Had run out of repatha at that time, now back on.     4.Preop evaluation - patient needs routine colonoscopy - plans for June, ok to hold plavix as needed     SH:  Wife works [employer]. Wifes mother passed Jun 03 2020 with COVID.  Past Medical History:  Diagnosis Date   Anxiety    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   CAD (coronary artery disease)    a. s/p BMS to LAD in 2007 with stent thrombosis and VF arrest and LAD re-stented b. DES to distal LAD in 2010 c. ISR and DES to LAD in 2017 d. DES to LCx in 04/2017 and severe ostial stenosis of small RCA e. 05/2019: DES to proximal LAD.    CHF (congestive heart failure) (HCC)  Depression    Dizziness    Dysphagia, unspecified(787.20)    GERD (gastroesophageal reflux disease)    History of seizure disorder    Hyperlipidemia, mixed    Hypertension    Hypertensive heart disease    unspecified   Ischemic cardiomyopathy    a. 06/2014 Echo: EF 60-65%.   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     Allergies  Allergen Reactions   Sulfamethoxazole-Trimethoprim Rash and Other (See Comments)    Sweet's syndrome (Sweet's syndrome is an uncommon skin condition marked by a distinctive eruption of tiny bumps that  enlarge and are often tender to the touch.)   Crestor [Rosuvastatin Calcium] Other (See Comments)    Myalgias   Hydromorphone Hcl Other (See Comments)    Left side went numb   Lorazepam Other (See Comments)    Hallucinations    Pseudoephedrine Other (See Comments)    Caused a seizure   Terfenadine Other (See Comments)    Reaction not known   Atorvastatin Other (See Comments)    Muscle pain   Biaxin [Clarithromycin] Rash    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     aspirin EC 81 MG tablet Take 81 mg by mouth daily.     busPIRone (BUSPAR) 15 MG tablet Take 15 mg by mouth 3 (three) times daily.     carvedilol (COREG) 25 MG tablet Take 1 tablet (25 mg total) 2 (two) times daily with a meal by mouth. 180 tablet 3   clopidogrel (PLAVIX) 75 MG tablet Take 1 tablet (75 mg total) daily by mouth. Same day PCI 90 tablet 3   Evolocumab (REPATHA SURECLICK) 140 MG/ML SOAJ Inject 140 mg into the skin every 14 (fourteen) days. 6 mL 3   ezetimibe (ZETIA) 10 MG tablet Take 1 tablet (10 mg total) by mouth daily. 90 tablet 3   FLUoxetine (PROZAC) 20 MG capsule Take 40 mg by mouth in the morning.     fluticasone (FLONASE) 50 MCG/ACT nasal spray Place 1 spray into both nostrils daily as needed for allergies.      HYDROcodone-acetaminophen (NORCO) 7.5-325 MG tablet Take 1 tablet by mouth 6 (six) times daily.     loratadine (CLARITIN) 10 MG tablet Take 10 mg by mouth in the morning.     nitroGLYCERIN (NITROSTAT) 0.4 MG SL tablet Place 1 tablet (0.4 mg total) under the tongue every 5 (five) minutes as needed for chest pain. 25 tablet 3   oxybutynin (DITROPAN) 5 MG tablet Take 5 mg by mouth in the morning and at bedtime.      pantoprazole (PROTONIX) 40 MG tablet Take 1 tablet (40 mg total) daily by mouth. 90 tablet 3   pravastatin (PRAVACHOL) 80 MG tablet Take 1 tablet (80 mg total) by mouth every evening. 90 tablet 3   ranolazine (RANEXA) 500 MG 12 hr tablet Take 1 tablet (500 mg total) by mouth 2 (two) times daily. 180 tablet 3   VENTOLIN HFA 108 (90 Base) MCG/ACT inhaler Inhale 1-2 puffs into the lungs every 6 (six) hours as needed for wheezing  or shortness of breath.      Vitamin D, Ergocalciferol, (DRISDOL) 1.25 MG (50000 UT) CAPS capsule Take 50,000 Units by mouth every Tuesday.     No current facility-administered medications for this visit.     Past Surgical History:  Procedure Laterality Date   bedside tracheostomy     with #6 Shiley. 04/14/2006   CARDIAC CATHETERIZATION  09/2005   with bare metal stent to the mid LAD (Minivision 2.5 x 18 mm) with Provisional balloon angioplasty to the second diagonal.   CARDIAC CATHETERIZATION  03/2006   with stenting of the mid LAD with Taxus 2.5 x 20 mm and 2.5 x 8 mm stents.       CARDIAC CATHETERIZATION N/A 07/20/2015   Procedure: Left Heart Cath and Coronary Angiography;  Surgeon: Corky Crafts, MD;  Location: [facility] INVASIVE CV LAB;  Service: Cardiovascular;  Laterality: N/A;   CARDIAC CATHETERIZATION N/A 07/20/2015   Procedure: Coronary Stent Intervention;  Surgeon: Corky Crafts, MD;  Location: [facility] INVASIVE CV LAB;  Service: Cardiovascular;  Laterality: N/A;   CORONARY ANGIOGRAPHY N/A 11/06/2021   Procedure: CORONARY ANGIOGRAPHY;  Surgeon: Tonny Bollman, MD;  Location: [facility] INVASIVE CV LAB;  Service: Cardiovascular;  Laterality: N/A;   CORONARY STENT INTERVENTION N/A 05/15/2017   Procedure: CORONARY STENT INTERVENTION;  Surgeon: Tonny Bollman, MD;  Location: [facility] INVASIVE CV LAB;  Service: Cardiovascular;  Laterality: N/A;   CORONARY STENT INTERVENTION N/A 05/27/2019   Procedure: CORONARY STENT INTERVENTION;  Surgeon: Tonny Bollman, MD;  Location: [facility]  INVASIVE CV LAB;  Service: Cardiovascular;  Laterality: N/A;   CORONARY STENT INTERVENTION N/A 11/06/2021   Procedure: CORONARY STENT INTERVENTION;  Surgeon: Tonny Bollman, MD;  Location: [facility] INVASIVE CV LAB;  Service: Cardiovascular;  Laterality: N/A;   CORONARY STENT PLACEMENT  07/20/2015   LAD with DES   ESOPHAGOGASTRODUODENOSCOPY     KIDNEY STONE SURGERY     LEFT HEART CATH AND CORONARY ANGIOGRAPHY N/A 05/15/2017   Procedure: LEFT HEART CATH AND CORONARY ANGIOGRAPHY;  Surgeon: Tonny Bollman, MD;  Location: [facility] INVASIVE CV LAB;  Service: Cardiovascular;  Laterality: N/A;   LEFT HEART CATH AND CORONARY ANGIOGRAPHY N/A 05/27/2019   Procedure: LEFT HEART CATH AND CORONARY ANGIOGRAPHY;  Surgeon: Tonny Bollman, MD;  Location: [facility] INVASIVE CV LAB;  Service: Cardiovascular;  Laterality: N/A;   LEFT HEART CATH AND CORONARY ANGIOGRAPHY N/A 10/21/2021   Procedure: LEFT HEART CATH AND CORONARY ANGIOGRAPHY;  Surgeon: Yvonne Kendall, MD;  Location: [facility] INVASIVE CV LAB;  Service: Cardiovascular;  Laterality: N/A;   PEG TUBE PLACEMENT     PEG TUBE REMOVAL     stent thrombosis  03/2006   Complicated my sudden cardiac death   TRACHEOSTOMY CLOSURE       Allergies  Allergen Reactions   Sulfamethoxazole-Trimethoprim Rash and Other (See Comments)    Sweet's syndrome (Sweet's syndrome is an uncommon skin condition marked by a distinctive eruption of tiny bumps that enlarge and are often tender to the touch.)   Crestor [Rosuvastatin Calcium] Other (See Comments)    Myalgias   Hydromorphone Hcl Other (See Comments)    Left side went numb   Lorazepam Other (See Comments)    Hallucinations    Pseudoephedrine Other (See Comments)    Caused a seizure   Terfenadine Other (See Comments)    Reaction not known   Atorvastatin Other (See Comments)    Muscle pain   Biaxin [Clarithromycin] Rash    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 Eigner reports that he has never smoked. He quit smokeless tobacco use about 17 years ago.  His smokeless tobacco use included chew. Mr. Vandervelden reports no history of alcohol use.   Review of Systems CONSTITUTIONAL: No weight loss, fever, chills, weakness or fatigue.  HEENT: Eyes: No visual loss, blurred vision, double vision or yellow sclerae.No hearing loss, sneezing, congestion, runny nose or sore throat.  SKIN: No rash or itching.  CARDIOVASCULAR: per hpi RESPIRATORY: No shortness of breath, cough or sputum.  GASTROINTESTINAL: No anorexia, nausea, vomiting or diarrhea. No abdominal pain or blood.  GENITOURINARY: No burning on urination, no polyuria NEUROLOGICAL: No headache, dizziness, syncope, paralysis, ataxia, numbness or tingling in the extremities. No change in bowel or bladder control.  MUSCULOSKELETAL: No muscle, back pain, joint pain or stiffness.  LYMPHATICS: No enlarged nodes. No history of splenectomy.  PSYCHIATRIC: No history of depression or anxiety.  ENDOCRINOLOGIC: No reports of sweating, cold or heat intolerance. No polyuria or polydipsia.  Marland Kitchen   Physical Examination Vitals:   11/06/22 1427 11/06/22 1441  BP: (!) 142/80 134/75  Pulse: 64   SpO2: 96%    Filed Weights   11/06/22 1427  Weight: 240 lb (108.9 kg)    Gen: resting comfortably, no acute distress HEENT: no scleral icterus, pupils equal round and reactive, no palptable cervical adenopathy,  CVL RRR, no mrg, no jvd Resp: Clear to auscultation bilaterally GI: abdomen is soft, non-tender, non-distended, normal bowel sounds, no hepatosplenomegaly MSK:  extremities are warm, no edema.  Skin: warm, no rash Neuro:  no focal deficits Psych: appropriate affect   Diagnostic Studies  11/2008 Cath RESULTS:  Left main coronary artery:  The left main coronary artery was  free of significant disease.    Left anterior descending:  The left anterior descending artery gave rise  to small diagonal Desha Bitner and small septal perforator, and larger  diagonal Kaiven Vester.  There was 30% narrowing within the midportion of the  previously placed stents.  Distal to the stent, there was an eccentric  lesion, which appeared to be a ruptured plaque, which was estimated to  be 70-80% obstructive.  There also was 40-50% diffuse narrowing in the  proximal LAD.    Circumflex artery:  The circumflex artery gave rise to a large ramus  Adaline Trejos and two posterolateral branches and a posterior descending  [place]ah Mill.  This was a dominant vessel.  There was 70-80% narrowing in the  proximal portion in the ramus Larwence Tu which was extended over about 18  mm.  There was also a 40% ostial narrowing in the circumflex artery and  50% narrowing in the ostium of the ramus Karely Hurtado.    Right coronary artery:  The right coronary artery was a nondominant  vessel which supplied only right ventricular branches.  There was a 70-  80% proximal stenosis.    Left ventriculogram:  The left ventriculogram was performed in the RAO  projection showed  hypokinesis of the anterolateral wall and apex.  The  estimated fraction was 45%.    Following stenting of the lesion in the mid LAD, the stenosis improved  from 80% to 0%.    CONCLUSIONS:  1. Coronary artery disease, status post previous percutaneous coronary      interventions as described above with 40% narrowing in the proximal      LAD, 30% narrowing within the stent in the mid LAD, and 80%      narrowing in the mid LAD after the stent, 70-80% narrowing in the      large ramus Kaylin Schellenberg of the circumflex artery with 40% ostial      stenosis in  the dominant circumflex artery, and 80% narrowing in      the proximal portion of a nondominant right coronary artery with      anterolateral wall and apical wall hypokinesis and estimated      ejection fraction of 45%.  2. Successful PCI of the lesion in the mid LAD distal to the      previously placed stent using a XIENCE drug-eluting stent with      improvement in center narrowing from 80% to 0%.    DISPOSITION:  The patient returned to [facility] room for further  observation.       Jan 2016 echo Study Conclusions  - Left ventricle: The cavity size was normal. Wall thickness was   normal. Systolic function was normal. The estimated ejection   fraction was in the range of 60% to 65%. Indeterminant diastolic   function. - Aortic valve: Mildly calcified annulus. Mildly thickened   leaflets. - Mitral valve: Mildly calcified annulus. Mildly thickened leaflets   . - Left atrium: The atrium was mildly dilated. - Systemic veins: IVC is small, suggestive of low RA pressure and   hypovolemia. - Technically adequate study.     07/10/15 Clinic EKG (performed and reviewed in clinic): Sinus bradycardia   Jan 2017 Lexiscan MPI   Perfusion Summary Defect 1:  There is a large defect of moderate severity present in the basal anterolateral, mid anterolateral and apical lateral location. The defect is partially reversible. Large, moderate intensity, partially reversible anterolateral defect consistent with scar and at least moderate peri-infarct ischemia.      Overall Study Impression Myocardial perfusion is abnormal. This is a high risk study. Overall left ventricular systolic function was abnormal. Nuclear stress EF: 43%.         Jan 2017 cath OM1 subtotally occluded with left to left collaterals coming from the apical LAD. Severe in-stent restenosis of prior bare-metal stent which was placed in 2007 in the setting of his cardiac arrest. This was treated with cutting balloon angioplasty  anddrug-eluting stent placement with a 2.5 x 28 Synergy drug-eluting stent, postdilatto greater than 3 m   Continue dual antiplatelet therapy indefinitely. The patient has significant memory loss after his cardiac arrest. Will discuss with his wife about the importance of staying on his dual antiplatelet therapy.    He'll be watched overnight with anticipated discharge tomorrow   04/2017 Nuclear stress There was no ST segment deviation noted during stress. Findings consistent with prior anterolateral myocardial infarction with mild peri-infarct ischemia. Large lateral/inferolatera/inferior/inferoapical defect with moderate ischemia This is a high risk study. High risk due to low ejection fraction and multiple areas of ischemia. Mild area of ischemia anterolateral. Moderate area of ischemia lateral/inferior walls The left ventricular ejection fraction is moderately decreased (30-44%).     04/2017  cath 1.  Severe ostial left circumflex stenosis treated successfully with drug-eluting stent implantation (3.5 x 28 mm Sierra DES postdilated with a 4.0 mm noncompliant balloon) 2.  Moderate diffuse in-stent restenosis of the LAD 3.  Severe stenosis of the RCA ostium (nondominant vessel) 4.  Normal LVEDP   Continue long-term dual antiplatelet therapy with aspirin and clopidogrel.  As long as no complications arise patient is eligible for same day discharge.   04/2017 echo Study Conclusions   - Left ventricle: The cavity size was normal. Wall thickness was   increased in a pattern of mild LVH. Systolic function was normal.   The estimated ejection fraction was in the range of 50% to 55%.   Left ventricular diastolic function parameters were normal. - Regional wall motion abnormality: Hypokinesis of the apical   anterior, apical inferior, and apical myocardium. - Aortic valve: Valve area (VTI): 3.01 cm^2. Valve area (Vmax):   3.29 cm^2. - Technically difficult study.   07/2018  echo IMPRESSIONS      1. The left ventricle has normal systolic function, with an ejection fraction of 55-60%. The cavity size was normal. There is mildly increased left ventricular wall thickness. Left ventricular diastolic parameters were normal.The apex is hypokinetic  2. The right ventricle has normal systolic function. The cavity was normal. There is no increase in right ventricular wall thickness.  3. Left atrial size was mildly dilated.  4. Small pericardial effusion.  5. The mitral valve is normal in structure. No evidence of mitral valve stenosis.  6. The tricuspid valve is normal in structure.  7. The aortic valve is tricuspid no stenosis of the aortic valve.  8. The aortic root is normal in size and structure.  9. Pulmonary hypertension is indeterminant, inadequate TR jet. 10. Right atrial pressure is estimated at 3 mmHg.   05/2019 nuclear stress There was no ST segment deviation noted during stress. Findings consistent with prior anterolateral/apical myocardial infarction with high level of peri-infarct ischemia. The left ventricular ejection fraction is normal (55-65%). High risk study based on degree of current ischemia.       05/2019 cath Severe calcific proximal LAD stenosis and moderate stenosis at the edge of the previously implanted mid LAD stent 2.  Continued patency of the stented segment in the proximal circumflex 3.  Chronic subtotal occlusion of the first OM Kimi Bordeau of the circumflex, unchanged from previous studies 4.  Chronic occlusion of the diagonal Keondrick Dilks of the LAD collateralized from the apical LAD, also unchanged from previous studies 5.  Normal LVEDP 6. Successful PCI of the proximal LAD using a 2.75x26 mm Orsiro DES   Recommendations: Continue long-term dual antiplatelet therapy with aspirin and clopidogrel, aggressive medical therapy, candidate for same-day PCI discharge as long as no early complications arise.   09/2021  cath Conclusions: Significant stenoses of up to 75% involving distal LMCA, ostial LAD and mid LAD (beyond previously stented segment). Chronic subtotal occlusion of OM1. Widely patent stent in the ostial/proximal LCx, which extends back into the distal LMCA. Low normal left ventricular contraction with subtle apical hypokinesis (LVEF 50-55%). Mildly elevated left ventricular filling pressure (LVEDP 21 mmHg).   Recommendations: Images reviewed with Dr. Excell Seltzer and case discussed with Mr. Stach and his wife.  His recurrent dyspnea and chest pain are most likely related to recurrent CAD involving the LAD and now extending into the distal LMCA.  We have all agreed that he is not a good candidate for CABG given his remote VF  arrest with continued memory deficits from anoxic brain injury.  We will plan for PCI to unprotected LMCA and LAD next week. Obtain echocardiogram to better assess LVEF and exclude underlying valvular abnormalities that could affect high-risk PCI. Continue indefinite DAPT with aspirin and clopidogrel. Aggressive secondary prevention of coronary artery disease.     Assessment and Plan   1. CAD  - evaluation has always been complicated by his severe memory deficit/prior anoxic brain injury as well as chronic left sided chest pain combined with his extensive prior history of CAD -recent LM/LAD PCI as reported - no symptoms, continue current meds including indefinite DAPT. Can hold plavix for upcoming colonoscopy.      2. Hyperlipidemia - difficulties tolerating crestor and lipitor, on pravatstatin and started on repatha. On zetia as well - continue current therapy, f/u upcoming pcp labs   3. Preoperative evaluation - plans for colonscopy, ok to hold plavix as needed. Would continue aspirin.      Antoine Poche, M.D.

## 2022-11-06 NOTE — Patient Instructions (Signed)
Medication Instructions:  Your physician recommends that you continue on your current medications as directed. Please refer to the Current Medication list given to you today.  *If you need a refill on your cardiac medications before your next appointment, please call your pharmacy*   Lab Work: None If you have labs (blood work) drawn today and your tests are completely normal, you will receive your results only by: MyChart Message (if you have MyChart) OR A paper copy in the mail If you have any lab test that is abnormal or we need to change your treatment, we will call you to review the results.   Testing/Procedures: None   Follow-Up: At [facility], you and your health needs are our priority.  As part of our continuing mission to provide you with exceptional heart care, we have created designated Provider Care Teams.  These Care Teams include your primary Cardiologist (physician) and Advanced Practice Providers (APPs -  Physician Assistants and Nurse Practitioners) who all work together to provide you with the care you need, when you need it.  We recommend signing up for the patient portal called "MyChart".  Sign up information is provided on this After Visit Summary.  MyChart is used to connect with patients for Virtual Visits (Telemedicine).  Patients are able to view lab/test results, encounter notes, upcoming appointments, etc.  Non-urgent messages can be sent to your provider as well.   To learn more about what you can do with MyChart, go to [email or web address].    Your next appointment:   6 month(s)  Provider:   Jonathan Branch, MD    Other Instructions    

## 2022-11-19 ENCOUNTER — Telehealth: Payer: Self-pay | Admitting: Cardiology

## 2022-11-19 NOTE — Telephone Encounter (Signed)
   Patient Name: [name] Cummings  DOB: [birth year] MRN: [id number]  Primary Cardiologist: Dina Rich, MD  Chart reviewed as part of pre-operative protocol coverage.   Simple dental extractions (i.e. 1-2 teeth), dental cleanings are considered low risk procedures per guidelines and generally do not require any specific cardiac clearance. It is also generally accepted that for simple extractions and dental cleanings, there is no need to interrupt blood thinner therapy.   SBE prophylaxis is not required for the patient from a cardiac standpoint.  I will route this recommendation to the requesting party via Epic fax function and remove from pre-op pool.  Please call with questions.  Joylene Grapes, NP 11/19/2022, 4:19 PM

## 2022-11-19 NOTE — Telephone Encounter (Signed)
   Pre-operative Risk Assessment    Patient Name: [name] Cummings  DOB: [birth year] MRN: [id number]     Request for Surgical Clearance    Procedure:   No specific procedure stated, sent Pre-Medication Clearance Form  Date of Surgery:  Clearance TBD                                 Surgeon:  Suzette Battiest, D.D.S Surgeon's Group or Practice Name:  [facility] Phone number:  [phone] Fax number:  [phone]   Type of Clearance Requested:   - Medical  - Pharmacy:  Hold if applicable      Type of Anesthesia:  Not Indicated   Additional requests/questions:    Signed, Seymour Bars   11/19/2022, 3:21 PM

## 2022-11-19 NOTE — Telephone Encounter (Signed)
I called the dentist office and spoke with someone. The receptionist stated that pt will have a regular cleaning at the end of May, however, they did send him to an oral surgeon to extract 1 tooth. She was unsure of when that appt is scheduled.

## 2022-11-20 NOTE — Telephone Encounter (Signed)
Patient's wife is calling stating requesting office says the patient will need pre-med for extraction and would like to know when and where that will be called in. States pre-med is for two days. Requesting call back.

## 2022-11-20 NOTE — Telephone Encounter (Signed)
See notes from pt's wife call today about SBE needed per the DDS office. I have reviewed the clearance notes from Bernadene Person, NP who states SBE is not required for this pt. Generally SBE is ordered when pt's have heart valve replacement or repair. I do not see that this is the case for this pt. I will however confirm with the pre op APP.

## 2022-11-20 NOTE — Telephone Encounter (Signed)
Patient does not require antibiotics prior to undergoing dental procedure.  Please advise that patient has no indication listed in his chart and has never been prescribed premedication by one of our physicians or providers.

## 2022-11-21 ENCOUNTER — Telehealth: Payer: Self-pay | Admitting: Cardiology

## 2022-11-21 NOTE — Telephone Encounter (Signed)
   Pre-operative Risk Assessment    Patient Name: [name] Cummings  DOB: 01/17/1961 MRN: [id number]      Request for Surgical Clearance    Procedure:   Colonoscopy  Date of Surgery:  Clearance TBD                                 Surgeon:  Dr. Iva Lento Group or Practice Name:  [facility] Phone number:  [phone] Fax number:  [phone]   Type of Clearance Requested:   - Medical  - Pharmacy:  Hold if applicable      Type of Anesthesia:   Propofol   Additional requests/questions:    SignedSeymour Bars   11/21/2022, 3:17 PM

## 2022-11-21 NOTE — Telephone Encounter (Signed)
I called the pt's wife and she tells me that the DDS usually orders the ABX for the pt before dental work, however they changed the protocol and have now sent a Rx to Dr. Wyline Mood for ABX. I went over the notes from the pre op APP that there did not seem to be any indication as to why ABX was needed. Pt's wife began to get upset when I was trying to explain to her the reasons why cardiology would give an Rx for ABX for dental work, but she did not want to listen. I did tell her that I will send a message to Dr. Wyline Mood about the Rx for ABX for DDS was sent to him yesterday. Pt's wife said fine he doesn't need ABX and hung up on me.

## 2022-11-21 NOTE — Telephone Encounter (Signed)
Staff message sent to gen card.

## 2022-11-21 NOTE — Telephone Encounter (Signed)
   Patient Name: [name] Cummings  DOB: 1961/05/21 MRN: [id number]  Primary Cardiologist: Dina Rich, MD  Chart reviewed as part of pre-operative protocol coverage. Given past medical history and time since last visit, based on ACC/AHA guidelines, [name] is at acceptable risk for the planned procedure without further cardiovascular testing.   The patient was advised that if he develops new symptoms prior to surgery to contact our office to arrange for a follow-up visit, and he verbalized understanding.  Patient can hold Plavix 5 days prior to scheduled procedure and should resume postprocedure when surgically safe.  I will route this recommendation to the requesting party via Epic fax function and remove from pre-op pool.  Please call with questions.  Napoleon Form, Leodis Rains, NP 11/21/2022, 3:30 PM

## 2022-11-21 NOTE — Telephone Encounter (Signed)
Dr.Branch's message relayed to wife.

## 2022-11-28 ENCOUNTER — Encounter (HOSPITAL_COMMUNITY): Payer: Self-pay | Admitting: Gastroenterology

## 2022-11-28 NOTE — Progress Notes (Signed)
Attempted to obtain medical history via telephone, unable to reach at this time. HIPAA compliant voicemail message left requesting return call to pre surgical testing department. 

## 2022-12-05 ENCOUNTER — Ambulatory Visit (HOSPITAL_COMMUNITY): Payer: 59 | Admitting: Anesthesiology

## 2022-12-05 ENCOUNTER — Other Ambulatory Visit: Payer: Self-pay

## 2022-12-05 ENCOUNTER — Encounter (HOSPITAL_COMMUNITY): Payer: Self-pay | Admitting: Gastroenterology

## 2022-12-05 ENCOUNTER — Ambulatory Visit (HOSPITAL_BASED_OUTPATIENT_CLINIC_OR_DEPARTMENT_OTHER): Payer: 59 | Admitting: Anesthesiology

## 2022-12-05 ENCOUNTER — Encounter (HOSPITAL_COMMUNITY)
Admission: RE | Disposition: A | Discharge status: Home / Self Care | Payer: Self-pay | Source: Home / Self Care | Attending: Gastroenterology

## 2022-12-05 ENCOUNTER — Ambulatory Visit (HOSPITAL_COMMUNITY)
Admission: RE | Admit: 2022-12-05 | Discharge: 2022-12-05 | Disposition: A | Discharge status: Home / Self Care | Payer: 59 | Attending: Gastroenterology | Admitting: Gastroenterology

## 2022-12-05 DIAGNOSIS — D126 Benign neoplasm of colon, unspecified: Secondary | ICD-10-CM

## 2022-12-05 DIAGNOSIS — Z87891 Personal history of nicotine dependence: Secondary | ICD-10-CM | POA: Insufficient documentation

## 2022-12-05 DIAGNOSIS — D120 Benign neoplasm of cecum: Secondary | ICD-10-CM | POA: Insufficient documentation

## 2022-12-05 DIAGNOSIS — I509 Heart failure, unspecified: Secondary | ICD-10-CM | POA: Insufficient documentation

## 2022-12-05 DIAGNOSIS — Z7902 Long term (current) use of antithrombotics/antiplatelets: Secondary | ICD-10-CM | POA: Insufficient documentation

## 2022-12-05 DIAGNOSIS — I252 Old myocardial infarction: Secondary | ICD-10-CM | POA: Diagnosis not present

## 2022-12-05 DIAGNOSIS — D122 Benign neoplasm of ascending colon: Secondary | ICD-10-CM | POA: Diagnosis not present

## 2022-12-05 DIAGNOSIS — Z1211 Encounter for screening for malignant neoplasm of colon: Secondary | ICD-10-CM | POA: Insufficient documentation

## 2022-12-05 DIAGNOSIS — I110 Hypertensive heart disease with heart failure: Secondary | ICD-10-CM

## 2022-12-05 DIAGNOSIS — K5730 Diverticulosis of large intestine without perforation or abscess without bleeding: Secondary | ICD-10-CM

## 2022-12-05 DIAGNOSIS — Z955 Presence of coronary angioplasty implant and graft: Secondary | ICD-10-CM | POA: Insufficient documentation

## 2022-12-05 DIAGNOSIS — D759 Disease of blood and blood-forming organs, unspecified: Secondary | ICD-10-CM | POA: Diagnosis not present

## 2022-12-05 DIAGNOSIS — I2510 Atherosclerotic heart disease of native coronary artery without angina pectoris: Secondary | ICD-10-CM | POA: Diagnosis not present

## 2022-12-05 HISTORY — PX: COLONOSCOPY WITH PROPOFOL: SHX5780

## 2022-12-05 HISTORY — PX: POLYPECTOMY: SHX5525

## 2022-12-05 SURGERY — COLONOSCOPY WITH PROPOFOL
Anesthesia: Monitor Anesthesia Care

## 2022-12-05 MED ORDER — SODIUM CHLORIDE 0.9 % IV SOLN: INTRAVENOUS | Status: DC

## 2022-12-05 MED ORDER — LACTATED RINGERS IV SOLN: INTRAVENOUS | Status: DC

## 2022-12-05 MED ORDER — PROPOFOL 500 MG/50ML IV EMUL
INTRAVENOUS | Status: DC | PRN
  Administered 2022-12-05: 100 ug/kg/min via INTRAVENOUS

## 2022-12-05 MED ORDER — PROPOFOL 10 MG/ML IV BOLUS
INTRAVENOUS | Status: DC | PRN
  Administered 2022-12-05: 10 mg via INTRAVENOUS
  Administered 2022-12-05: 20 mg via INTRAVENOUS
  Administered 2022-12-05: 10 mg via INTRAVENOUS
  Administered 2022-12-05: 20 mg via INTRAVENOUS
  Administered 2022-12-05 (×2): 10 mg via INTRAVENOUS

## 2022-12-05 MED ORDER — LIDOCAINE 2% (20 MG/ML) 5 ML SYRINGE
INTRAMUSCULAR | Status: DC | PRN
  Administered 2022-12-05: 40 mg via INTRAVENOUS

## 2022-12-05 MED ORDER — PROPOFOL 500 MG/50ML IV EMUL
INTRAVENOUS | Status: AC
  Filled 2022-12-05: qty 50

## 2022-12-05 SURGICAL SUPPLY — 22 items

## 2022-12-05 NOTE — Discharge Instructions (Signed)

## 2022-12-05 NOTE — H&P (Signed)
[name] Cummings HPI: At this time the patient denies any problems with nausea, vomiting, fevers, chills, abdominal pain, diarrhea, constipation, hematochezia, melena, GERD, or dysphagia. The patient denies any known family history of colon cancers. No complaints of chest pain, SOB, MI, or sleep apnea.  His colonoscopy in 2012 was negative for any pathology.  He was started on Plavix in 2007 s/p MI with anoxic brain injury.  Multiple cardiac stents were placed.   Past Medical History:  Diagnosis Date   Anxiety    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   CAD (coronary artery disease)    a. s/p BMS to LAD in 2007 with stent thrombosis and VF arrest and LAD re-stented b. DES to distal LAD in 2010 c. ISR and DES to LAD in 2017 d. DES to LCx in 04/2017 and severe ostial stenosis of small RCA e. 05/2019: DES to proximal LAD.    CHF (congestive heart failure) (HCC)    Depression    Dizziness    Dysphagia, unspecified(787.20)    GERD (gastroesophageal reflux disease)    History of seizure disorder    Hyperlipidemia, mixed    Hypertension    Hypertensive heart disease    unspecified   Ischemic cardiomyopathy    a. 06/2014 Echo: EF 60-65%.   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    Past Surgical History:  Procedure Laterality Date   bedside tracheostomy     with #6 Shiley. 04/14/2006   CARDIAC CATHETERIZATION  09/2005   with bare metal stent to the mid LAD (Minivision 2.5 x 18 mm) with Provisional balloon angioplasty to the second diagonal.   CARDIAC CATHETERIZATION  03/2006   with stenting of the mid LAD with Taxus 2.5 x 20 mm and 2.5 x 8 mm stents.       CARDIAC CATHETERIZATION N/A 07/20/2015   Procedure: Left Heart Cath and Coronary Angiography;  Surgeon: Corky Crafts, MD;  Location: [facility] INVASIVE CV LAB;  Service: Cardiovascular;  Laterality: N/A;   CARDIAC  CATHETERIZATION N/A 07/20/2015   Procedure: Coronary Stent Intervention;  Surgeon: Corky Crafts, MD;  Location: [facility] INVASIVE CV LAB;  Service: Cardiovascular;  Laterality: N/A;   CORONARY ANGIOGRAPHY N/A 11/06/2021   Procedure: CORONARY ANGIOGRAPHY;  Surgeon: Tonny Bollman, MD;  Location: [facility] INVASIVE CV LAB;  Service: Cardiovascular;  Laterality: N/A;   CORONARY STENT INTERVENTION N/A 05/15/2017   Procedure: CORONARY STENT INTERVENTION;  Surgeon: Tonny Bollman, MD;  Location: [facility] INVASIVE CV LAB;  Service: Cardiovascular;  Laterality: N/A;   CORONARY STENT INTERVENTION N/A 05/27/2019   Procedure: CORONARY STENT INTERVENTION;  Surgeon: Tonny Bollman, MD;  Location: [facility] INVASIVE CV LAB;  Service: Cardiovascular;  Laterality: N/A;   CORONARY STENT INTERVENTION N/A 11/06/2021   Procedure: CORONARY STENT INTERVENTION;  Surgeon: Tonny Bollman, MD;  Location: [facility] INVASIVE CV LAB;  Service: Cardiovascular;  Laterality: N/A;   CORONARY STENT PLACEMENT  07/20/2015   LAD with DES   ESOPHAGOGASTRODUODENOSCOPY     KIDNEY STONE SURGERY     LEFT HEART CATH AND CORONARY ANGIOGRAPHY N/A 05/15/2017   Procedure: LEFT HEART CATH AND CORONARY ANGIOGRAPHY;  Surgeon: Tonny Bollman, MD;  Location: [facility] INVASIVE CV LAB;  Service: Cardiovascular;  Laterality: N/A;   LEFT HEART CATH AND CORONARY ANGIOGRAPHY N/A 05/27/2019   Procedure: LEFT HEART CATH  AND CORONARY ANGIOGRAPHY;  Surgeon: Tonny Bollman, MD;  Location: [facility] INVASIVE CV LAB;  Service: Cardiovascular;  Laterality: N/A;   LEFT HEART CATH AND CORONARY ANGIOGRAPHY N/A 10/21/2021   Procedure: LEFT HEART CATH AND CORONARY ANGIOGRAPHY;  Surgeon: Yvonne Kendall, MD;  Location: [facility] INVASIVE CV LAB;  Service: Cardiovascular;  Laterality: N/A;   PEG TUBE PLACEMENT     PEG TUBE REMOVAL     stent thrombosis  03/2006   Complicated my sudden cardiac death   TRACHEOSTOMY CLOSURE      Family History  Problem Relation Age of Onset   Heart attack Mother     Diabetes Mother    Hydrocephalus Mother    Heart attack Father    Cancer Father        unsure of type   Diabetes Sister    Coronary artery disease Neg Hx        Siblings    Social History:  reports that he has never smoked. He quit smokeless tobacco use about 17 years ago.  His smokeless tobacco use included chew. He reports that he does not drink alcohol and does not use drugs.  Allergies:  Allergies  Allergen Reactions   Sulfamethoxazole-Trimethoprim Rash and Other (See Comments)    Sweet's syndrome (Sweet's syndrome is an uncommon skin condition marked by a distinctive eruption of tiny bumps that enlarge and are often tender to the touch.)   Crestor [Rosuvastatin Calcium] Other (See Comments)    Myalgias   Hydromorphone Hcl Other (See Comments)    Left side went numb   Lorazepam Other (See Comments)    Hallucinations    Pseudoephedrine Other (See Comments)    Caused a seizure   Terfenadine Other (See Comments)    Reaction not known   Atorvastatin Other (See Comments)    Muscle pain   Biaxin [Clarithromycin] Rash    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   No results found.  ROS:  As stated above in the HPI otherwise negative.  Blood pressure 119/84, pulse (!) 53, temperature 97.7 F (36.5 C), temperature source Tympanic, resp. rate 16, height 6\' 2"  (1.88 m), weight 110.2 kg, SpO2 99 %.    PE: Gen: NAD, Alert and Oriented HEENT:  [employer]/AT, EOMI Neck: Supple, no LAD Lungs: CTA Bilaterally CV: RRR without M/G/R ABD: Soft, NTND, +BS Ext: No C/C/E  Assessment/Plan: 1) Screening colonoscopy.  [name] Cummings D 12/05/2022, 10:00 AM

## 2022-12-05 NOTE — Anesthesia Procedure Notes (Signed)
Procedure Name: MAC Date/Time: 12/05/2022 9:31 AM  Performed by: Lovie Chol, CRNAPre-anesthesia Checklist: Patient identified, Emergency Drugs available, Suction available and Patient being monitored Oxygen Delivery Method: Simple face mask

## 2022-12-05 NOTE — Op Note (Signed)
[facility] Patient Name: [name] Cummings Procedure Date: 12/05/2022 MRN: [id number] Attending MD: Jeani Hawking , MD, [id number] Date of Birth: [birth year] CSN: [id number] Age: 62 Admit Type: Outpatient Procedure:                Colonoscopy Indications:              Screening for colorectal malignant neoplasm Providers:                Jeani Hawking, MD, Lorenza Evangelist, RN, Salley Scarlet, Technician, Romie Minus, CRNA Referring MD:              Medicines:                Propofol per Anesthesia Complications:            No immediate complications. Estimated Blood Loss:     Estimated blood loss: none. Procedure:                Pre-Anesthesia Assessment:                           - Prior to the procedure, a History and Physical                            was performed, and patient medications and                            allergies were reviewed. The patient's tolerance of                            previous anesthesia was also reviewed. The risks                            and benefits of the procedure and the sedation                            options and risks were discussed with the patient.                            All questions were answered, and informed consent                            was obtained. Prior Anticoagulants: The patient has                            taken no anticoagulant or antiplatelet agents. ASA                            Grade Assessment: III - A patient with severe                            systemic disease. After reviewing the risks and  benefits, the patient was deemed in satisfactory                            condition to undergo the procedure.                           - Sedation was administered by an anesthesia                            professional. Deep sedation was attained.                           After obtaining informed consent, the colonoscope                             was passed under direct vision. Throughout the                            procedure, the patient's blood pressure, pulse, and                            oxygen saturations were monitored continuously. The                            CF-HQ190L ([id number]) Olympus colonoscope was                            introduced through the anus and advanced to the the                            cecum, identified by appendiceal orifice and                            ileocecal valve. The colonoscopy was performed                            without difficulty. The patient tolerated the                            procedure well. The quality of the bowel                            preparation was evaluated using the BBPS [facility]                            Bowel Preparation Scale) with scores of: Right                            Colon = 3 (entire mucosa seen well with no residual                            staining, small fragments of stool or opaque  liquid), Transverse Colon = 2 (minor amount of                            residual staining, small fragments of stool and/or                            opaque liquid, but mucosa seen well) and Left Colon                            = 3 (entire mucosa seen well with no residual                            staining, small fragments of stool or opaque                            liquid). The total BBPS score equals 8. The quality                            of the bowel preparation was good. The patient                            tolerated the procedure well. Scope In: 9:35:22 AM Scope Out: 9:57:28 AM Scope Withdrawal Time: 0 hours 13 minutes 54 seconds  Total Procedure Duration: 0 hours 22 minutes 6 seconds  Findings:      Two sessile polyps were found in the ascending colon and cecum. The       polyps were 2 to 3 mm in size. These polyps were removed with a cold       snare. Resection and retrieval were complete.      A few medium-mouthed  diverticula were found in the sigmoid colon. Impression:               - Two 2 to 3 mm polyps in the ascending colon and                            in the cecum, removed with a cold snare. Resected                            and retrieved.                           - Diverticulosis in the sigmoid colon. Moderate Sedation:      Not Applicable - Patient had care per Anesthesia. Recommendation:           - Patient has a contact number available for                            emergencies. The signs and symptoms of potential                            delayed complications were discussed with the                            patient. Return to  normal activities tomorrow.                            Written discharge instructions were provided to the                            patient.                           - Resume previous diet.                           - Continue present medications.                           - Await pathology results.                           - Repeat colonoscopy in 7 years for surveillance. Procedure Code(s):        --- Professional ---                           [id number], Colonoscopy, flexible; with removal of                            tumor(s), polyp(s), or other lesion(s) by snare                            technique Diagnosis Code(s):        --- Professional ---                           D12.2, Benign neoplasm of ascending colon                           Z12.11, Encounter for screening for malignant                            neoplasm of colon                           D12.0, Benign neoplasm of cecum                           K57.30, Diverticulosis of large intestine without                            perforation or abscess without bleeding CPT copyright 2022 American Medical Association. All rights reserved. The codes documented in this report are preliminary and upon coder review may  be revised to meet current compliance requirements. Jeani Hawking, MD Jeani Hawking, MD 12/05/2022 10:03:44 AM This report has been signed electronically. Number of Addenda: 0

## 2022-12-05 NOTE — Transfer of Care (Signed)
Immediate Anesthesia Transfer of Care Note  Patient: [name] Cummings  Procedure(s) Performed: COLONOSCOPY WITH PROPOFOL POLYPECTOMY  Patient Location: PACU  Anesthesia Type:MAC  Level of Consciousness: drowsy and patient cooperative  Airway & Oxygen Therapy: Patient Spontanous Breathing and Patient connected to face mask oxygen  Post-op Assessment: Report given to RN and Post -op Vital signs reviewed and stable  Post vital signs: Reviewed  Last Vitals:  Vitals Value Taken Time  BP    Temp    Pulse    Resp 10 12/05/22 1008  SpO2    Vitals shown include unvalidated device data.  Last Pain:  Vitals:   12/05/22 0851  TempSrc: Tympanic  PainSc: 0-No pain         Complications: No notable events documented.

## 2022-12-05 NOTE — Anesthesia Postprocedure Evaluation (Signed)
Anesthesia Post Note  Patient: [name] Cummings  Procedure(s) Performed: COLONOSCOPY WITH PROPOFOL POLYPECTOMY     Patient location during evaluation: Endoscopy Anesthesia Type: MAC Level of consciousness: awake Pain management: pain level controlled Vital Signs Assessment: post-procedure vital signs reviewed and stable Respiratory status: spontaneous breathing, nonlabored ventilation and respiratory function stable Cardiovascular status: blood pressure returned to baseline and stable Postop Assessment: no apparent nausea or vomiting Anesthetic complications: no   No notable events documented.  Last Vitals:  Vitals:   12/05/22 1030 12/05/22 1040  BP: 118/60 130/88  Pulse: (!) 48 (!) 51  Resp: (!) 21 15  Temp:    SpO2: 97% 99%    Last Pain:  Vitals:   12/05/22 1040  TempSrc:   PainSc: 0-No pain                 Esmee Fallaw P Josphine Laffey

## 2022-12-05 NOTE — Anesthesia Preprocedure Evaluation (Addendum)
Anesthesia Evaluation  Patient identified by MRN, date of birth, ID band Patient awake    Reviewed: Allergy & Precautions, NPO status , Patient's Chart, lab work & pertinent test results  Airway Mallampati: II  TM Distance: >3 FB Neck ROM: Full    Dental  (+) Poor Dentition, Missing, Dental Advisory Given   Pulmonary neg pulmonary ROS   Pulmonary exam normal        Cardiovascular hypertension, Pt. on medications and Pt. on home beta blockers + CAD, + Past MI, + Cardiac Stents and +CHF  Normal cardiovascular exam     Neuro/Psych Seizures -,  PSYCHIATRIC DISORDERS Anxiety Depression    anoxic brain injury    GI/Hepatic Neg liver ROS,GERD  Medicated and Controlled,,  Endo/Other  negative endocrine ROS    Renal/GU negative Renal ROS     Musculoskeletal negative musculoskeletal ROS (+)    Abdominal   Peds  Hematology  (+) Blood dyscrasia (Plavix)   Anesthesia Other Findings screening  Reproductive/Obstetrics                              Anesthesia Physical Anesthesia Plan  ASA: 3  Anesthesia Plan: MAC   Post-op Pain Management:    Induction: Intravenous  PONV Risk Score and Plan: 1 and Propofol infusion and Treatment may vary due to age or medical condition  Airway Management Planned: Simple Face Mask  Additional Equipment:   Intra-op Plan:   Post-operative Plan:   Informed Consent: I have reviewed the patients History and Physical, chart, labs and discussed the procedure including the risks, benefits and alternatives for the proposed anesthesia with the patient or authorized representative who has indicated his/her understanding and acceptance.     Dental advisory given and Consent reviewed with POA  Plan Discussed with: CRNA  Anesthesia Plan Comments:         Anesthesia Quick Evaluation

## 2022-12-08 LAB — SURGICAL PATHOLOGY

## 2022-12-10 ENCOUNTER — Encounter (HOSPITAL_COMMUNITY): Payer: Self-pay | Admitting: Gastroenterology

## 2023-04-24 ENCOUNTER — Other Ambulatory Visit: Payer: Self-pay | Admitting: Cardiology

## 2023-05-11 ENCOUNTER — Ambulatory Visit: Payer: 59 | Admitting: Cardiology

## 2023-06-02 ENCOUNTER — Ambulatory Visit: Payer: 59 | Admitting: Cardiology

## 2023-06-09 ENCOUNTER — Telehealth: Payer: Self-pay | Admitting: Cardiology

## 2023-06-09 ENCOUNTER — Encounter: Payer: Self-pay | Admitting: Cardiology

## 2023-06-09 ENCOUNTER — Encounter: Payer: Self-pay | Admitting: *Deleted

## 2023-06-09 ENCOUNTER — Ambulatory Visit: Payer: 59 | Attending: Cardiology | Admitting: Cardiology

## 2023-06-09 VITALS — BP 118/68 | HR 60 | Ht 73.5 in | Wt 230.0 lb

## 2023-06-09 DIAGNOSIS — R079 Chest pain, unspecified: Secondary | ICD-10-CM

## 2023-06-09 DIAGNOSIS — E782 Mixed hyperlipidemia: Secondary | ICD-10-CM | POA: Diagnosis not present

## 2023-06-09 DIAGNOSIS — I25119 Atherosclerotic heart disease of native coronary artery with unspecified angina pectoris: Secondary | ICD-10-CM | POA: Diagnosis not present

## 2023-06-09 MED ORDER — EZETIMIBE 10 MG PO TABS: 10.0000 mg | ORAL_TABLET | Freq: Every day | ORAL | 6 refills | Status: DC

## 2023-06-09 NOTE — Telephone Encounter (Signed)
Checking percert on the following patient for testing scheduled at [facility].    [facility]   07/06/2023

## 2023-06-09 NOTE — Addendum Note (Signed)
Addended by: Lesle Chris on: 06/09/2023 11:45 AM   Modules accepted: Orders

## 2023-06-09 NOTE — Progress Notes (Addendum)
Clinical Summary [name] Cummings is a 62 y.o.male seen today for follow up of the following medical problems   History is obtained primarily from interview of his wife. Due to patient's history of anoxic brain injury and severe short term memory deficit   1. CAD - hx of BMS to LAD in 2007, followed by stent thrombosis with resulting VF arrest. LAD was restented at that time. - cath 2010 LAD 50% prox, 30% ISR in mid LAD, distal LAD 70-80%, LCX 40% ostial, 70-80% narrowing proximal portion LCX in the ramus [name] Cummings, RCA 70-80% proximal. LVEF 45%, the distal LAD was stented.   - cath Jan 2017 severe ISR of LAD stent, received another DES. Small OM1 subtotally occluded with left to left collaterals - echo Jan 2016 LVEF 60-65% - seen in [facility] 08/2015 with atypical chest pain, no evidence of ACS. Imdur was added.        04/2017 nuclear stress: anterolateral infarct with mild peri-infarct ischemia. Large lateral/inferolateral/inferior/inferioapical defect with moderate ischemia. LVEF 30-44% by nuclear.  - 04/2017 echo LVEF 50-55%, multiple WMAs   -04/2017 cath severe LCX ostial disease, received DES. Moderate ISR of LAD stent. Severe ostial disease of small nondominant RCA.      07/2018 echo LVEF 55-60%, apex hypokinetic - 05/2019 nuclear stress high degree of ischemia   - 05/2019 cath as reported below, received DES to prox LAD - given stent burden and prior stent thrombosis plan for indefinitie DAPT       Jan 2022 lexiscan: Large, moderate intensity, apical to basal anterolateral defect that is partially reversible and consistent with ischemia. - chest pains improved after stress test and decided to monitor clinically - recently wife reports patient with increased SOB/DOE, increased chest pains. Occurring daily now - compliant with meds      09/2021 cath: LM 75%, prox to mid LAD 50% and 75%, LCX patent, OM1 99% chronic, ostial RCA 75%. Given poor cognitive status and based on  discussions with family elected for PCI as opposed to CABG - 11/06/21: DES to distal LM and ostial LAD - 09/2021 echo: LVEF 55-60%, apical akinesis.  *Plans for lifelong DAPT given stent burden and also LM stent     - some increase in frequency in recent chest pains. No SOB/DOE. Has tried some NG      2. 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 Patient is not able to give description of symptoms, symptoms are all relayed through his wife.    3. Hyperlipidemia - did not tolerate crestor or lipitor - has been on pravastatin, then repatha added   04/2022 TC 170 TG 160 HDL 41 LDL 109. Had run out of repatha at that time, now back on.    05/2023 TC 142 TG 104 HDL 49 LDL 74 - he is on repatha, pravastatin. Unclear what happened to zetia.         SH:  Wife works [employer]. Wifes mother passed Jun 03 2020 with COVID.  Past Medical History:  Diagnosis Date   Anxiety    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   CAD (coronary artery disease)    a. s/p BMS to LAD in 2007 with stent thrombosis and VF arrest and LAD re-stented b. DES to distal LAD in 2010 c. ISR and DES to LAD in 2017 d. DES to LCx in 04/2017 and severe ostial stenosis of small RCA  e. 05/2019: DES to proximal LAD.    CHF (congestive heart failure) (HCC)    Depression    Dizziness    Dysphagia, unspecified(787.20)    GERD (gastroesophageal reflux disease)    History of seizure disorder    Hyperlipidemia, mixed    Hypertension    Hypertensive heart disease    unspecified   Ischemic cardiomyopathy    a. 06/2014 Echo: EF 60-65%.   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     Allergies  Allergen Reactions   Sulfamethoxazole-Trimethoprim Rash and Other (See Comments)    Sweet's syndrome (Sweet's syndrome is an uncommon skin condition marked by a  distinctive eruption of tiny bumps that enlarge and are often tender to the touch.)   Crestor [Rosuvastatin Calcium] Other (See Comments)    Myalgias   Hydromorphone Hcl Other (See Comments)    Left side went numb   Lorazepam Other (See Comments)    Hallucinations    Pseudoephedrine Other (See Comments)    Caused a seizure   Terfenadine Other (See Comments)    Reaction not known   Atorvastatin Other (See Comments)    Muscle pain   Biaxin [Clarithromycin] Rash    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     aspirin EC 81 MG tablet Take 81 mg by mouth daily.     busPIRone (BUSPAR) 30 MG tablet Take 30 mg by mouth 2 (two) times daily.     carvedilol (COREG) 25 MG tablet Take 1 tablet (25 mg total) 2 (two) times daily with a meal by mouth. 180 tablet 3   clopidogrel (PLAVIX) 75 MG tablet Take 1 tablet (75 mg total) daily by mouth. Same day PCI 90 tablet 3   Evolocumab (REPATHA SURECLICK) 140 MG/ML SOAJ INJECT 140 MG INTO THE SKIN EVERY 14 DAYS 6 mL 6   FLUoxetine (PROZAC) 20 MG capsule Take 40 mg by mouth in the morning.     fluticasone (FLONASE) 50 MCG/ACT nasal spray Place 1 spray into both nostrils daily as needed for allergies.      HYDROcodone-acetaminophen (NORCO) 7.5-325 MG tablet Take 1 tablet by mouth 6 (six) times daily.     loratadine (CLARITIN) 10 MG tablet Take 10 mg by mouth in the morning.     nitroGLYCERIN (NITROSTAT) 0.4 MG SL tablet Place 1 tablet (0.4 mg total) under the tongue every 5 (five) minutes as needed for chest pain. 25 tablet 3   oxybutynin (DITROPAN) 5 MG tablet Take 5 mg by mouth in the morning and at bedtime.     pantoprazole (PROTONIX) 40 MG tablet Take 1 tablet (40 mg total) daily by  mouth. 90 tablet 3   pravastatin (PRAVACHOL) 80 MG tablet Take 80 mg by mouth every evening.     VENTOLIN HFA 108 (90 Base) MCG/ACT inhaler Inhale 1-2 puffs into the lungs every 6 (six) hours as needed for wheezing or shortness of breath.      Vitamin D, Ergocalciferol, (DRISDOL) 1.25 MG (50000 UT) CAPS capsule Take 50,000 Units by mouth every Tuesday.     No  current facility-administered medications for this visit.     Past Surgical History:  Procedure Laterality Date   bedside tracheostomy     with #6 Shiley. 04/14/2006   CARDIAC CATHETERIZATION  09/2005   with bare metal stent to the mid LAD (Minivision 2.5 x 18 mm) with Provisional balloon angioplasty to the second diagonal.   CARDIAC CATHETERIZATION  03/2006   with stenting of the mid LAD with Taxus 2.5 x 20 mm and 2.5 x 8 mm stents.       CARDIAC CATHETERIZATION N/A 07/20/2015   Procedure: Left Heart Cath and Coronary Angiography;  Surgeon: Corky Crafts, MD;  Location: [facility] INVASIVE CV LAB;  Service: Cardiovascular;  Laterality: N/A;   CARDIAC CATHETERIZATION N/A 07/20/2015   Procedure: Coronary Stent Intervention;  Surgeon: Corky Crafts, MD;  Location: [facility] INVASIVE CV LAB;  Service: Cardiovascular;  Laterality: N/A;   COLONOSCOPY WITH PROPOFOL N/A 12/05/2022   Procedure: COLONOSCOPY WITH PROPOFOL;  Surgeon: Jeani Hawking, MD;  Location: [facility] ENDOSCOPY;  Service: Gastroenterology;  Laterality: N/A;   CORONARY ANGIOGRAPHY N/A 11/06/2021   Procedure: CORONARY ANGIOGRAPHY;  Surgeon: Tonny Bollman, MD;  Location: [facility] INVASIVE CV LAB;  Service: Cardiovascular;  Laterality: N/A;   CORONARY STENT INTERVENTION N/A 05/15/2017   Procedure: CORONARY STENT INTERVENTION;  Surgeon: Tonny Bollman, MD;  Location: [facility] INVASIVE CV LAB;  Service: Cardiovascular;  Laterality: N/A;   CORONARY STENT INTERVENTION N/A 05/27/2019   Procedure: CORONARY STENT INTERVENTION;  Surgeon: Tonny Bollman, MD;  Location: [facility] INVASIVE CV LAB;  Service:  Cardiovascular;  Laterality: N/A;   CORONARY STENT INTERVENTION N/A 11/06/2021   Procedure: CORONARY STENT INTERVENTION;  Surgeon: Tonny Bollman, MD;  Location: [facility] INVASIVE CV LAB;  Service: Cardiovascular;  Laterality: N/A;   CORONARY STENT PLACEMENT  07/20/2015   LAD with DES   ESOPHAGOGASTRODUODENOSCOPY     KIDNEY STONE SURGERY     LEFT HEART CATH AND CORONARY ANGIOGRAPHY N/A 05/15/2017   Procedure: LEFT HEART CATH AND CORONARY ANGIOGRAPHY;  Surgeon: Tonny Bollman, MD;  Location: [facility] INVASIVE CV LAB;  Service: Cardiovascular;  Laterality: N/A;   LEFT HEART CATH AND CORONARY ANGIOGRAPHY N/A 05/27/2019   Procedure: LEFT HEART CATH AND CORONARY ANGIOGRAPHY;  Surgeon: Tonny Bollman, MD;  Location: [facility] INVASIVE CV LAB;  Service: Cardiovascular;  Laterality: N/A;   LEFT HEART CATH AND CORONARY ANGIOGRAPHY N/A 10/21/2021   Procedure: LEFT HEART CATH AND CORONARY ANGIOGRAPHY;  Surgeon: Yvonne Kendall, MD;  Location: [facility] INVASIVE CV LAB;  Service: Cardiovascular;  Laterality: N/A;   PEG TUBE PLACEMENT     PEG TUBE REMOVAL     POLYPECTOMY  12/05/2022   Procedure: POLYPECTOMY;  Surgeon: Jeani Hawking, MD;  Location: [facility] ENDOSCOPY;  Service: Gastroenterology;;   stent thrombosis  03/2006   Complicated my sudden cardiac death   TRACHEOSTOMY CLOSURE       Allergies  Allergen Reactions   Sulfamethoxazole-Trimethoprim Rash and Other (See Comments)    Sweet's syndrome (Sweet's syndrome is an uncommon skin condition marked by a distinctive eruption of tiny bumps that enlarge and are often tender to the touch.)   Crestor [Rosuvastatin Calcium] Other (See Comments)    Myalgias   Hydromorphone Hcl Other (See Comments)    Left side went numb   Lorazepam Other (See Comments)    Hallucinations    Pseudoephedrine Other (See Comments)    Caused a seizure   Terfenadine Other (See Comments)    Reaction not known   Atorvastatin Other (See Comments)    Muscle  pain   Biaxin [Clarithromycin] Rash     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 He quit smokeless tobacco use about 17 years ago.  His smokeless tobacco use included chew. [name] Cummings reports no history of alcohol use.   Review of Systems CONSTITUTIONAL: No weight loss, fever, chills, weakness or fatigue.  HEENT: Eyes: No visual loss, blurred vision, double vision or yellow sclerae.No hearing loss, sneezing, congestion, runny nose or sore throat.  SKIN: No rash or itching.  CARDIOVASCULAR: per hpi RESPIRATORY: No shortness of breath, cough or sputum.  GASTROINTESTINAL: No anorexia, nausea, vomiting or diarrhea. No abdominal pain or blood.  GENITOURINARY: No burning on urination, no polyuria NEUROLOGICAL: No headache, dizziness, syncope, paralysis, ataxia, numbness or tingling in the extremities. No change in bowel or bladder control.  MUSCULOSKELETAL: No muscle, back pain, joint pain or stiffness.  LYMPHATICS: No enlarged nodes. No history of splenectomy.  PSYCHIATRIC: No history of depression or anxiety.  ENDOCRINOLOGIC: No reports of sweating, cold or heat intolerance. No polyuria or polydipsia.  Marland Kitchen   Physical Examination Today's Vitals   06/09/23 1059  BP: 118/68  Pulse: 60  SpO2: 96%  Weight: 230 lb (104.3 kg)  Height: 6' 1.5" (1.867 m)   Body mass index is 29.93 kg/m.  Gen: resting comfortably, no acute distress HEENT: no scleral icterus, pupils equal round and reactive, no palptable cervical adenopathy,  CV: RRR, no m/rg, no jvd Resp: Clear to  auscultation bilaterally GI: abdomen is soft, non-tender, non-distended, normal bowel sounds, no hepatosplenomegaly MSK: extremities are warm, no edema.  Skin: warm, no rash Neuro:  no focal deficits Psych: appropriate affect   Diagnostic Studies 11/2008 Cath RESULTS:  Left main coronary artery:  The left main coronary artery was  free of significant disease.    Left anterior descending:  The left anterior descending artery gave rise  to small diagonal [name] Cummings and small septal perforator, and larger  diagonal [name] Cummings.  There was 30% narrowing within the midportion of the  previously placed stents.  Distal to the stent, there was an eccentric  lesion, which appeared to be a ruptured plaque, which was estimated to  be 70-80% obstructive.  There also was 40-50% diffuse narrowing in the  proximal LAD.    Circumflex artery:  The circumflex artery gave rise to a large ramus  [name] Cummings and two posterolateral branches and a posterior descending  [name] Cummings.  This was a dominant vessel.  There was 70-80% narrowing in the  proximal portion in the ramus [name] Cummings which was extended over about 18  mm.  There was also a 40% ostial narrowing in the circumflex artery and  50% narrowing in the ostium of the ramus [name] Cummings.    Right coronary artery:  The right coronary artery was a nondominant  vessel which supplied only right ventricular branches.  There was a 70-  80% proximal stenosis.  Left ventriculogram:  The left ventriculogram was performed in the RAO  projection showed hypokinesis of the anterolateral wall and apex.  The  estimated fraction was 45%.    Following stenting of the lesion in the mid LAD, the stenosis improved  from 80% to 0%.    CONCLUSIONS:  1. Coronary artery disease, status post previous percutaneous coronary      interventions as described above with 40% narrowing in the proximal      LAD, 30% narrowing within the stent in the mid LAD, and 80%      narrowing in the mid LAD  after the stent, 70-80% narrowing in the      large ramus [name] Cummings of the circumflex artery with 40% ostial      stenosis in the dominant circumflex artery, and 80% narrowing in      the proximal portion of a nondominant right coronary artery with      anterolateral wall and apical wall hypokinesis and estimated      ejection fraction of 45%.  2. Successful PCI of the lesion in the mid LAD distal to the      previously placed stent using a XIENCE drug-eluting stent with      improvement in center narrowing from 80% to 0%.    DISPOSITION:  The patient returned to [facility] room for further  observation.       Jan 2016 echo Study Conclusions  - Left ventricle: The cavity size was normal. Wall thickness was   normal. Systolic function was normal. The estimated ejection   fraction was in the range of 60% to 65%. Indeterminant diastolic   function. - Aortic valve: Mildly calcified annulus. Mildly thickened   leaflets. - Mitral valve: Mildly calcified annulus. Mildly thickened leaflets   . - Left atrium: The atrium was mildly dilated. - Systemic veins: IVC is small, suggestive of low RA pressure and   hypovolemia. - Technically adequate study.     07/10/15 Clinic EKG (performed and reviewed in clinic): Sinus bradycardia   Jan 2017 Lexiscan MPI   Perfusion Summary Defect 1:  There is a large defect of moderate severity present in the basal anterolateral, mid anterolateral and apical lateral location. The defect is partially reversible. Large, moderate intensity, partially reversible anterolateral defect consistent with scar and at least moderate peri-infarct ischemia.      Overall Study Impression Myocardial perfusion is abnormal. This is a high risk study. Overall left ventricular systolic function was abnormal. Nuclear stress EF: 43%.         Jan 2017 cath OM1 subtotally occluded with left to left collaterals coming from the apical LAD. Severe in-stent restenosis of prior  bare-metal stent which was placed in 2007 in the setting of his cardiac arrest. This was treated with cutting balloon angioplasty anddrug-eluting stent placement with a 2.5 x 28 Synergy drug-eluting stent, postdilatto greater than 3 m   Continue dual antiplatelet therapy indefinitely. The patient has significant memory loss after his cardiac arrest. Will discuss with his wife about the importance of staying on his dual antiplatelet therapy.    He'll be watched overnight with anticipated discharge tomorrow   04/2017 Nuclear stress There was no ST segment deviation noted during stress. Findings consistent with prior anterolateral myocardial infarction with mild peri-infarct ischemia. Large lateral/inferolatera/inferior/inferoapical defect with moderate ischemia This is a high risk study. High risk due to low ejection fraction and multiple areas of ischemia. Mild area of ischemia anterolateral. Moderate area of ischemia lateral/inferior walls  The left ventricular ejection fraction is moderately decreased (30-44%).     04/2017 cath 1.  Severe ostial left circumflex stenosis treated successfully with drug-eluting stent implantation (3.5 x 28 mm Sierra DES postdilated with a 4.0 mm noncompliant balloon) 2.  Moderate diffuse in-stent restenosis of the LAD 3.  Severe stenosis of the RCA ostium (nondominant vessel) 4.  Normal LVEDP   Continue long-term dual antiplatelet therapy with aspirin and clopidogrel.  As long as no complications arise patient is eligible for same day discharge.   04/2017 echo Study Conclusions   - Left ventricle: The cavity size was normal. Wall thickness was   increased in a pattern of mild LVH. Systolic function was normal.   The estimated ejection fraction was in the range of 50% to 55%.   Left ventricular diastolic function parameters were normal. - Regional wall motion abnormality: Hypokinesis of the apical   anterior, apical inferior, and apical myocardium. - Aortic  valve: Valve area (VTI): 3.01 cm^2. Valve area (Vmax):   3.29 cm^2. - Technically difficult study.   07/2018 echo IMPRESSIONS      1. The left ventricle has normal systolic function, with an ejection fraction of 55-60%. The cavity size was normal. There is mildly increased left ventricular wall thickness. Left ventricular diastolic parameters were normal.The apex is hypokinetic  2. The right ventricle has normal systolic function. The cavity was normal. There is no increase in right ventricular wall thickness.  3. Left atrial size was mildly dilated.  4. Small pericardial effusion.  5. The mitral valve is normal in structure. No evidence of mitral valve stenosis.  6. The tricuspid valve is normal in structure.  7. The aortic valve is tricuspid no stenosis of the aortic valve.  8. The aortic root is normal in size and structure.  9. Pulmonary hypertension is indeterminant, inadequate TR jet. 10. Right atrial pressure is estimated at 3 mmHg.   05/2019 nuclear stress There was no ST segment deviation noted during stress. Findings consistent with prior anterolateral/apical myocardial infarction with high level of peri-infarct ischemia. The left ventricular ejection fraction is normal (55-65%). High risk study based on degree of current ischemia.       05/2019 cath Severe calcific proximal LAD stenosis and moderate stenosis at the edge of the previously implanted mid LAD stent 2.  Continued patency of the stented segment in the proximal circumflex 3.  Chronic subtotal occlusion of the first OM [name] Cummings of the circumflex, unchanged from previous studies 4.  Chronic occlusion of the diagonal [name] Cummings of the LAD collateralized from the apical LAD, also unchanged from previous studies 5.  Normal LVEDP 6. Successful PCI of the proximal LAD using a 2.75x26 mm Orsiro DES   Recommendations: Continue long-term dual antiplatelet therapy with aspirin and clopidogrel, aggressive medical therapy,  candidate for same-day PCI discharge as long as no early complications arise.   09/2021 cath Conclusions: Significant stenoses of up to 75% involving distal LMCA, ostial LAD and mid LAD (beyond previously stented segment). Chronic subtotal occlusion of OM1. Widely patent stent in the ostial/proximal LCx, which extends back into the distal LMCA. Low normal left ventricular contraction with subtle apical hypokinesis (LVEF 50-55%). Mildly elevated left ventricular filling pressure (LVEDP 21 mmHg).   Recommendations: Images reviewed with Dr. Excell Seltzer and case discussed with Mr. Marinoff and his wife.  His recurrent dyspnea and chest pain are most likely related to recurrent CAD involving the LAD and now extending into the distal LMCA.  We have all  agreed that he is not a good candidate for CABG given his remote VF arrest with continued memory deficits from anoxic brain injury.  We will plan for PCI to unprotected LMCA and LAD next week. Obtain echocardiogram to better assess LVEF and exclude underlying valvular abnormalities that could affect high-risk PCI. Continue indefinite DAPT with aspirin and clopidogrel. Aggressive secondary prevention of coronary artery disease.        Assessment and Plan  1. CAD with angina pectoris - evaluation has always been complicated by his severe memory deficit/prior anoxic brain injury as well as chronic left sided chest pain combined with his extensive prior history of CAD -recent increase in his chronic chest pain symptoms, will plan for repeat lexiscan to further risk stratify  - EKG today SR, no acute ischemic changes     2. Hyperlipidemia - difficulties tolerating crestor and lipitor, on pravatstatin and started on repatha. LDL remains above goal of <82, add back zetia 10mg  daily to current regimen        Antoine Poche, M.D.

## 2023-06-09 NOTE — Patient Instructions (Addendum)
Medication Instructions:   Begin Zetia 10mg  daily Continue all other medications.     Labwork:  none  Testing/Procedures:  Your physician has requested that you have a lexiscan myoview. For further information please visit [email or web address] Please follow instruction sheet, as given.  Office will contact with results via phone, letter or mychart.     Follow-Up:  6 months   Any Other Special Instructions Will Be Listed Below (If Applicable).   If you need a refill on your cardiac medications before your next appointment, please call your pharmacy.

## 2023-06-22 ENCOUNTER — Encounter (HOSPITAL_BASED_OUTPATIENT_CLINIC_OR_DEPARTMENT_OTHER)
Admission: RE | Admit: 2023-06-22 | Discharge: 2023-06-22 | Disposition: A | Discharge status: Home / Self Care | Payer: 59 | Source: Ambulatory Visit | Attending: Cardiology | Admitting: Cardiology

## 2023-06-22 ENCOUNTER — Ambulatory Visit (HOSPITAL_COMMUNITY)
Admission: RE | Admit: 2023-06-22 | Discharge: 2023-06-22 | Disposition: A | Discharge status: Home / Self Care | Payer: 59 | Source: Ambulatory Visit | Attending: Cardiology | Admitting: Cardiology

## 2023-06-22 DIAGNOSIS — R079 Chest pain, unspecified: Secondary | ICD-10-CM

## 2023-06-22 LAB — NM MYOCAR MULTI W/SPECT W/WALL MOTION / EF
Estimated workload: 1
Exercise duration (min): 0 min
Exercise duration (sec): 0 s
LV dias vol: 126 mL (ref 62–150)
LV sys vol: 66 mL
MPHR: 158 {beats}/min
Nuc Stress EF: 48 %
Peak HR: 97 {beats}/min
Percent HR: 61 %
RATE: 0.4
Rest HR: 55 {beats}/min
Rest Nuclear Isotope Dose: 11 mCi
SDS: 2
SRS: 11
SSS: 13
ST Depression (mm): 0 mm
Stress Nuclear Isotope Dose: 29 mCi
TID: 1.16

## 2023-06-22 MED ORDER — TECHNETIUM TC 99M TETROFOSMIN IV KIT
30.0000 | PACK | Freq: Once | INTRAVENOUS | Status: AC | PRN
  Administered 2023-06-22: 29 via INTRAVENOUS

## 2023-06-22 MED ORDER — TECHNETIUM TC 99M TETROFOSMIN IV KIT
10.0000 | PACK | Freq: Once | INTRAVENOUS | Status: AC | PRN
  Administered 2023-06-22: 11 via INTRAVENOUS

## 2023-06-22 MED ORDER — SODIUM CHLORIDE FLUSH 0.9 % IV SOLN
INTRAVENOUS | Status: AC
  Administered 2023-06-22: 10 mL via INTRAVENOUS
  Filled 2023-06-22: qty 10

## 2023-06-22 MED ORDER — REGADENOSON 0.4 MG/5ML IV SOLN
INTRAVENOUS | Status: AC
  Administered 2023-06-22: 0.4 mg via INTRAVENOUS
  Filled 2023-06-22: qty 5

## 2023-06-30 ENCOUNTER — Encounter: Payer: Self-pay | Admitting: Cardiology

## 2023-06-30 ENCOUNTER — Ambulatory Visit: Payer: 59 | Attending: Cardiology | Admitting: Cardiology

## 2023-06-30 VITALS — BP 112/59 | HR 59 | Ht 73.5 in | Wt 230.0 lb

## 2023-06-30 DIAGNOSIS — I25119 Atherosclerotic heart disease of native coronary artery with unspecified angina pectoris: Secondary | ICD-10-CM

## 2023-06-30 MED ORDER — RANOLAZINE ER 500 MG PO TB12: 500.0000 mg | ORAL_TABLET | Freq: Two times a day (BID) | ORAL | 3 refills | Status: DC

## 2023-06-30 NOTE — Patient Instructions (Addendum)
 Medication Instructions:  Your physician has recommended you make the following change in your medication:   -Start Ranexa  500 mg twice daily.  -Continue all other medications.  *If you need a refill on your cardiac medications before your next appointment, please call your pharmacy*   Lab Work: None If you have labs (blood work) drawn today and your tests are completely normal, you will receive your results only by: MyChart Message (if you have MyChart) OR A paper copy in the mail If you have any lab test that is abnormal or we need to change your treatment, we will call you to review the results.   Testing/Procedures: None   Follow-Up: At [facility], you and your health needs are our priority.  As part of our continuing mission to provide you with exceptional heart care, we have created designated Provider Care Teams.  These Care Teams include your primary Cardiologist (physician) and Advanced Practice Providers (APPs -  Physician Assistants and Nurse Practitioners) who all work together to provide you with the care you need, when you need it.  We recommend signing up for the patient portal called MyChart.  Sign up information is provided on this After Visit Summary.  MyChart is used to connect with patients for Virtual Visits (Telemedicine).  Patients are able to view lab/test results, encounter notes, upcoming appointments, etc.  Non-urgent messages can be sent to your provider as well.   To learn more about what you can do with MyChart, go to [email or web address].    Your next appointment:   3-4 week(s)  Provider:   You will see one of the following Advanced Practice Providers on your designated Care Team:   Brittany Strader, PA-C  Olivia Pavy, [place]       Other Instructions

## 2023-06-30 NOTE — Progress Notes (Signed)
 Virtual Visit via Telephone Note   Because of [name] Cummings's co-morbid illnesses, he is at least at moderate risk for complications without adequate follow up.  This format is felt to be most appropriate for this patient at this time.  The patient did not have access to video technology/had technical difficulties with video requiring transitioning to audio format only (telephone).  All issues noted in this document were discussed and addressed.  No physical exam could be performed with this format.  Please refer to the patient's chart for his consent to telehealth for [facility].    Date:  06/30/2023   ID:  XAIDEN FLEIG, DOB [birth year], MRN [id number] The patient was identified using 2 identifiers.  Patient Location: Home Provider Location: Office/Clinic   PCP:  Tanda Prentice DEL, MD   [facility] Providers Cardiologist:  Alvan Carrier, MD     Evaluation Performed:  Follow-Up Visit  Chief Complaint:  Chest pain  History of Present Illness:    SHALIK SANFILIPPO seen today for follow up of the following medical problems   History is obtained primarily from interview of his wife. Due to patient's history of anoxic brain injury and severe short term memory deficit This is a focused visit on recent history of chest pain and abnormal stress test.     1. CAD - hx of BMS to LAD in 2007, followed by stent thrombosis with resulting VF arrest. LAD was restented at that time. - cath 2010 LAD 50% prox, 30% ISR in mid LAD, distal LAD 70-80%, LCX 40% ostial, 70-80% narrowing proximal portion LCX in the ramus Adien Kimmel, RCA 70-80% proximal. LVEF 45%, the distal LAD was stented.   - cath Jan 2017 severe ISR of LAD stent, received another DES. Small OM1 subtotally occluded with left to left collaterals - echo Jan 2016 LVEF 60-65% - seen in hopsital 08/2015 with atypical chest pain, no evidence of ACS. Imdur  was added.        04/2017 nuclear stress: anterolateral infarct with  mild peri-infarct ischemia. Large lateral/inferolateral/inferior/inferioapical defect with moderate ischemia. LVEF 30-44% by nuclear.  - 04/2017 echo LVEF 50-55%, multiple WMAs   -04/2017 cath severe LCX ostial disease, received DES. Moderate ISR of LAD stent. Severe ostial disease of small nondominant RCA.      07/2018 echo LVEF 55-60%, apex hypokinetic - 05/2019 nuclear stress high degree of ischemia   - 05/2019 cath as reported below, received DES to prox LAD - given stent burden and prior stent thrombosis plan for indefinitie DAPT       Jan 2022 lexiscan : Large, moderate intensity, apical to basal anterolateral defect that is partially reversible and consistent with ischemia. - chest pains improved after stress test and decided to monitor clinically - recently wife reports patient with increased SOB/DOE, increased chest pains. Occurring daily now - compliant with meds      09/2021 cath: LM 75%, prox to mid LAD 50% and 75%, LCX patent, OM1 99% chronic, ostial RCA 75%. Given poor cognitive status and based on discussions with family elected for PCI as opposed to CABG - 11/06/21: DES to distal LM and ostial LAD - 09/2021 echo: LVEF 55-60%, apical akinesis.  *Plans for lifelong DAPT given stent burden and also LM stent     - last visit some increase in frequency in recent chest pains. No SOB/DOE. Has tried some NG  05/2023 nuclear stress test: lateral infarction with moderate peri-infarct ischemia - since last visit fairly stable symptoms,  though difficult for family to asssess due to patient's memory issues.    Other medical problems not addressed this visit     2. 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 Patient is not able to give description of symptoms, symptoms are all relayed through his wife.    3. Hyperlipidemia - did not tolerate crestor  or lipitor - has been on pravastatin , then repatha  added   04/2022 TC 170 TG 160 HDL 41 LDL 109. Had  run out of repatha  at that time, now back on.     05/2023 TC 142 TG 104 HDL 49 LDL 74 - he is on repatha , pravastatin . Unclear what happened to zetia .    Past Medical History:  Diagnosis Date   Anxiety    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   CAD (coronary artery disease)    a. s/p BMS to LAD in 2007 with stent thrombosis and VF arrest and LAD re-stented b. DES to distal LAD in 2010 c. ISR and DES to LAD in 2017 d. DES to LCx in 04/2017 and severe ostial stenosis of small RCA e. 05/2019: DES to proximal LAD.    CHF (congestive heart failure) (HCC)    Depression    Dizziness    Dysphagia, unspecified(787.20)    GERD (gastroesophageal reflux disease)    History of seizure disorder    Hyperlipidemia, mixed    Hypertension    Hypertensive heart disease    unspecified   Ischemic cardiomyopathy    a. 06/2014 Echo: EF 60-65%.   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   Past Surgical History:  Procedure Laterality Date   bedside tracheostomy     with #6 Shiley. 04/14/2006   CARDIAC CATHETERIZATION  09/2005   with bare metal stent to the mid LAD (Minivision 2.5 x 18 mm) with Provisional balloon angioplasty to the second diagonal.   CARDIAC CATHETERIZATION  03/2006   with stenting of the mid LAD with Taxus 2.5 x 20 mm and 2.5 x 8 mm stents.       CARDIAC CATHETERIZATION N/A 07/20/2015   Procedure: Left Heart Cath and Coronary Angiography;  Surgeon: Candyce GORMAN Reek, MD;  Location[facility] INVASIVE CV LAB;  Service: Cardiovascular;  Laterality: N/A;   CARDIAC CATHETERIZATION N/A 07/20/2015   Procedure: Coronary Stent Intervention;  Surgeon: Candyce GORMAN Reek, MD;  Location: [facility] INVASIVE CV LAB;  Service: Cardiovascular;  Laterality: N/A;   COLONOSCOPY WITH PROPOFOL  N/A 12/05/2022   Procedure: COLONOSCOPY WITH PROPOFOL ;  Surgeon: Rollin Dover, MD;  Location: [facility]  ENDOSCOPY;  Service: Gastroenterology;  Laterality: N/A;   CORONARY ANGIOGRAPHY N/A 11/06/2021   Procedure: CORONARY ANGIOGRAPHY;  Surgeon: Wonda Sharper, MD;  Location: [facility] INVASIVE CV LAB;  Service: Cardiovascular;  Laterality: N/A;   CORONARY STENT INTERVENTION N/A 05/15/2017   Procedure: CORONARY STENT INTERVENTION;  Surgeon: Wonda Sharper, MD;  Location: [facility] INVASIVE CV LAB;  Service: Cardiovascular;  Laterality: N/A;   CORONARY STENT INTERVENTION N/A 05/27/2019   Procedure: CORONARY STENT INTERVENTION;  Surgeon: Wonda Sharper, MD;  Location: [facility] INVASIVE CV LAB;  Service: Cardiovascular;  Laterality: N/A;   CORONARY STENT INTERVENTION N/A 11/06/2021   Procedure: CORONARY STENT INTERVENTION;  Surgeon: Wonda Sharper, MD;  Location: [facility] INVASIVE CV LAB;  Service: Cardiovascular;  Laterality: N/A;   CORONARY  STENT PLACEMENT  07/20/2015   LAD with DES   ESOPHAGOGASTRODUODENOSCOPY     KIDNEY STONE SURGERY     LEFT HEART CATH AND CORONARY ANGIOGRAPHY N/A 05/15/2017   Procedure: LEFT HEART CATH AND CORONARY ANGIOGRAPHY;  Surgeon: Wonda Sharper, MD;  Location: [facility] INVASIVE CV LAB;  Service: Cardiovascular;  Laterality: N/A;   LEFT HEART CATH AND CORONARY ANGIOGRAPHY N/A 05/27/2019   Procedure: LEFT HEART CATH AND CORONARY ANGIOGRAPHY;  Surgeon: Wonda Sharper, MD;  Location: [facility] INVASIVE CV LAB;  Service: Cardiovascular;  Laterality: N/A;   LEFT HEART CATH AND CORONARY ANGIOGRAPHY N/A 10/21/2021   Procedure: LEFT HEART CATH AND CORONARY ANGIOGRAPHY;  Surgeon: Mady Bruckner, MD;  Location: [facility] INVASIVE CV LAB;  Service: Cardiovascular;  Laterality: N/A;   PEG TUBE PLACEMENT     PEG TUBE REMOVAL     POLYPECTOMY  12/05/2022   Procedure: POLYPECTOMY;  Surgeon: Rollin Dover, MD;  Location: [facility] ENDOSCOPY;  Service: Gastroenterology;;   stent thrombosis  03/2006   Complicated my sudden cardiac death   TRACHEOSTOMY CLOSURE       Current Meds  Medication Sig   aspirin  EC 81 MG tablet Take 81 mg  by mouth daily.   busPIRone  (BUSPAR ) 30 MG tablet Take 30 mg by mouth 2 (two) times daily.   carvedilol  (COREG ) 25 MG tablet Take 1 tablet (25 mg total) 2 (two) times daily with a meal by mouth.   clopidogrel  (PLAVIX ) 75 MG tablet Take 1 tablet (75 mg total) daily by mouth. Same day PCI   Evolocumab  (REPATHA  SURECLICK) 140 MG/ML SOAJ INJECT 140 MG INTO THE SKIN EVERY 14 DAYS   ezetimibe  (ZETIA ) 10 MG tablet Take 1 tablet (10 mg total) by mouth daily.   FLUoxetine  (PROZAC ) 20 MG capsule Take 20 mg by mouth 2 (two) times daily.   fluticasone  (FLONASE ) 50 MCG/ACT nasal spray Place 1 spray into both nostrils daily as needed for allergies.    HYDROcodone -acetaminophen  (NORCO) 7.5-325 MG tablet Take 1 tablet by mouth 5 (five) times daily.   loratadine  (CLARITIN ) 10 MG tablet Take 10 mg by mouth in the morning.   nitroGLYCERIN  (NITROSTAT ) 0.4 MG SL tablet Place 1 tablet (0.4 mg total) under the tongue every 5 (five) minutes as needed for chest pain.   ondansetron  (ZOFRAN -ODT) 4 MG disintegrating tablet Take 4 mg by mouth every 8 (eight) hours as needed.   oxybutynin  (DITROPAN ) 5 MG tablet Take 5 mg by mouth in the morning and at bedtime.   pantoprazole  (PROTONIX ) 40 MG tablet Take 1 tablet (40 mg total) daily by mouth.   pravastatin  (PRAVACHOL ) 80 MG tablet Take 80 mg by mouth every evening.   VENTOLIN  HFA 108 (90 Base) MCG/ACT inhaler Inhale 1-2 puffs into the lungs every 6 (six) hours as needed for wheezing or shortness of breath.    Vitamin D, Ergocalciferol, (DRISDOL) 1.25 MG (50000 UT) CAPS capsule Take 50,000 Units by mouth every Tuesday.     Allergies:   Sulfamethoxazole-trimethoprim, Aripiprazole, Crestor  [rosuvastatin  calcium ], Duloxetine, Hydromorphone hcl, Lorazepam, Pseudoephedrine, Terfenadine, Atorvastatin , and Biaxin [clarithromycin]   Social History   Tobacco Use   Smoking status: Never   Smokeless tobacco: Former    Types: Chew    Quit date: 09/03/2005  Vaping Use   Vaping  status: Never Used  Substance Use Topics   Alcohol use: No    Alcohol/week: 0.0 standard drinks of alcohol   Drug use: No     Family Hx: The patient's family history includes Cancer in  his father; Diabetes in his mother and sister; Heart attack in his father and mother; Hydrocephalus in his mother. There is no history of Coronary artery disease.  ROS:   Please see the history of present illness.     All other systems reviewed and are negative.     Labs/Other Tests and Data Reviewed:    EKG:  No ECG reviewed.  Recent Labs: No results found for requested labs within last 365 days.   Recent Lipid Panel Lab Results  Component Value Date/Time   CHOL 225 (H) 05/22/2020 09:14 AM   TRIG 121 05/22/2020 09:14 AM   HDL 44 05/22/2020 09:14 AM   CHOLHDL 5.1 (H) 05/22/2020 09:14 AM   CHOLHDL 4.8 04/21/2017 04:17 PM   LDLCALC 159 (H) 05/22/2020 09:14 AM    Wt Readings from Last 3 Encounters:  06/30/23 230 lb (104.3 kg)  06/09/23 230 lb (104.3 kg)  12/05/22 243 lb (110.2 kg)          Objective:    Vital Signs:  BP (!) 112/59   Pulse (!) 59   Ht 6' 1.5 (1.867 m)   Wt 230 lb (104.3 kg)   BMI 29.93 kg/m    Normal affect. Normal speech pattern and tone. Comfortable, no apparent distress. No audible signs of SOB or wheezing.   ASSESSMENT & PLAN:    1,CAD with angina pectoris - evaluation has always been complicated by his severe memory deficit/prior anoxic brain injury as well as chronic left sided chest pain combined with his extensive prior history of CAD -recent increase in his chronic chest pain symptoms - nuclear stress suggests moderate lateral ischemia - dizziness has limited antianginals, previously ranexa  was limited due to cost but family reports should not be an issue. Start ranexa  500mg  bid - reassess 3-4 weeks, if progressing symptoms would need to consider cath.       Time:   Today, I have spent 19  minutes with the patient with telehealth technology  discussing the above problems.     Medication Adjustments/Labs and Tests Ordered: Current medicines are reviewed at length with the patient today.  Concerns regarding medicines are outlined above.   Tests Ordered: No orders of the defined types were placed in this encounter.   Medication Changes: No orders of the defined types were placed in this encounter.   Follow Up:  In Person 3-4 weeks  Signed, Alvan Carrier, MD  06/30/2023 2:49 PM     [facility]

## 2023-07-06 ENCOUNTER — Other Ambulatory Visit (HOSPITAL_COMMUNITY): Payer: 59

## 2023-07-06 ENCOUNTER — Encounter (HOSPITAL_COMMUNITY): Payer: 59

## 2023-08-12 ENCOUNTER — Encounter: Payer: Self-pay | Admitting: Student

## 2023-08-12 ENCOUNTER — Ambulatory Visit: Payer: 59 | Attending: Student | Admitting: Student

## 2023-08-12 VITALS — BP 126/60 | HR 64 | Ht 73.5 in | Wt 230.0 lb

## 2023-08-12 DIAGNOSIS — E785 Hyperlipidemia, unspecified: Secondary | ICD-10-CM | POA: Diagnosis not present

## 2023-08-12 DIAGNOSIS — I25118 Atherosclerotic heart disease of native coronary artery with other forms of angina pectoris: Secondary | ICD-10-CM | POA: Diagnosis not present

## 2023-08-12 DIAGNOSIS — G931 Anoxic brain damage, not elsewhere classified: Secondary | ICD-10-CM

## 2023-08-12 DIAGNOSIS — I10 Essential (primary) hypertension: Secondary | ICD-10-CM | POA: Diagnosis not present

## 2023-08-12 NOTE — Patient Instructions (Signed)
Medication Instructions:  Your physician has recommended you make the following change in your medication:   Stop taking Zetia and notify our office in a few weeks how your pain is   *If you need a refill on your cardiac medications before your next appointment, please call your pharmacy*   Lab Work: NONE   If you have labs (blood work) drawn today and your tests are completely normal, you will receive your results only by: MyChart Message (if you have MyChart) OR A paper copy in the mail If you have any lab test that is abnormal or we need to change your treatment, we will call you to review the results.   Testing/Procedures: NONE    Follow-Up: At [facility], you and your health needs are our priority.  As part of our continuing mission to provide you with exceptional heart care, we have created designated Provider Care Teams.  These Care Teams include your primary Cardiologist (physician) and Advanced Practice Providers (APPs -  Physician Assistants and Nurse Practitioners) who all work together to provide you with the care you need, when you need it.  We recommend signing up for the patient portal called "MyChart".  Sign up information is provided on this After Visit Summary.  MyChart is used to connect with patients for Virtual Visits (Telemedicine).  Patients are able to view lab/test results, encounter notes, upcoming appointments, etc.  Non-urgent messages can be sent to your provider as well.   To learn more about what you can do with MyChart, go to [email or web address].    Your next appointment:   3-4  month(s)  Provider:   You may see Dina Rich, MD or one of the following Advanced Practice Providers on your designated Care Team:   Randall An, PA-C  Jacolyn Reedy, [place]     Other Instructions Thank you for choosing [facility]!

## 2023-08-12 NOTE — Progress Notes (Signed)
Cardiology Office Note    Date:  08/12/2023  ID:  Cummings [name], DOB [birth year], MRN [id number] Cardiologist: Dina Rich, MD    History of Present Illness:    [name] Cummings is a 63 y.o. male with past medical history of CAD (s/p BMS to LAD in 2007 with stent thrombosis and VF arrest and LAD re-stented, DES to distal LAD in 2010, ISR and DES to LAD in 2017, DES to LCx in 04/2017 and severe ostial stenosis of small RCA, DES to proximal LAD in 05/2019, DES placement to distal LM/ostial LAD in 10/2021), anoxic brain injury (following VF arrest in 2007 with persistent short-term memory loss), HTN, and HLD who presents to the office today for 1 month follow-up.  He most recently had a telehealth visit with Dr. Wyline Mood on 06/30/2023 and recent stress test had shown lateral infarct with moderate peri-infarct ischemia and was overall difficult to assess his anginal symptoms given his memory issues.  Dizziness had previously limited antianginals, therefore was recommended to start Ranexa 500 mg twice daily.  Was recommend to reassess symptoms in 3 to 4 weeks and if progressive symptoms, would need to consider repeat cardiac catheterization.  In talking with the patient and his wife today, most history is provided by his wife given his short-term memory loss.  She reports that his episodes of chest pain significantly improved after starting Ranexa. He has noted leg pain but she feels this might be due to Zetia as both medications were started at the same time. Reports he was previously on Zetia and she cannot recall why this was discontinued. No specific dyspnea on exertion, palpitations, orthopnea, PND or pitting edema.  Studies Reviewed:   EKG: EKG is not ordered today.  Echocardiogram: 09/2021 IMPRESSIONS     1. There is sluggish blood flow by definity contrast in the apex. There  is no obvious LV apical thrombus but increased risk for thrombus  formation. Left ventricular ejection fraction,  by estimation, is 55 to  60%. The left ventricle has normal function.  The left ventricle demonstrates regional wall motion abnormalities (see  scoring diagram/findings for description). There is mild concentric left  ventricular hypertrophy. Left ventricular diastolic parameters are  indeterminate. There is akinesis of the  left ventricular, apical segment.   2. Right ventricular systolic function is normal. The right ventricular  size is normal. There is normal pulmonary artery systolic pressure. The  estimated right ventricular systolic pressure is 7.0 mmHg.   3. The mitral valve is normal in structure. No evidence of mitral valve  regurgitation. No evidence of mitral stenosis.   4. The aortic valve is normal in structure. Aortic valve regurgitation is  not visualized. No aortic stenosis is present.   5. The inferior vena cava is normal in size with greater than 50%  respiratory variability, suggesting right atrial pressure of 3 mmHg.   Coronary Stent Intervention: 10/2021 Successful stenting of severe distal left main and ostial LAD stenoses using a 4.0 x 16 mm Synergy DES using intravascular ultrasound guidance   Recommend: Same-day PCI protocol as long as no complications arise.  Continue dual antiplatelet therapy with aspirin and clopidogrel indefinitely.  NST: 05/2023   Findings are consistent with infarction with peri-infarct ischemia. The study is intermediate risk.   No ST deviation was noted. The ECG was negative for ischemia.   LV perfusion is abnormal.  Large sized, mild to moderate intensity, apical to basal lateral defect that exhibits partial reversibility.  Apical  portion of the defect is largely fixed.  Suggestive of scar with moderate peri-infarct ischemia.   Left ventricular function is abnormal. Nuclear stress EF: 48%.   Overall intermediate risk study with lateral scar and moderate peri-infarct ischemia, LVEF 48%.  Physical Exam:   VS:  BP 126/60 (BP Location:  Left Arm, Patient Position: Sitting, Cuff Size: Normal)   Pulse 64   Ht 6' 1.5" (1.867 m)   Wt 230 lb (104.3 kg)   SpO2 98%   BMI 29.93 kg/m    Wt Readings from Last 3 Encounters:  08/12/23 230 lb (104.3 kg)  06/30/23 230 lb (104.3 kg)  06/09/23 230 lb (104.3 kg)     GEN: Well nourished, well developed male appearing in no acute distress NECK: No JVD; No carotid bruits CARDIAC: RRR, no murmurs, rubs, gallops RESPIRATORY:  Clear to auscultation without rales, wheezing or rhonchi  ABDOMEN: Appears non-distended. No obvious abdominal masses. EXTREMITIES: No clubbing or cyanosis. No pitting edema.  Distal pedal pulses are 2+ bilaterally.   Assessment and Plan:   1. CAD - He has a history of multiple stents as discussed above and recent NST in 05/2023 showed moderate peri-infarct ischemia and the plan was for medical therapy initially with a cardiac catheterization if refractory to medical therapy. - His chest pain has significantly improved with initiation of Ranexa but he is experiencing leg pain and this started after initiation of Ranexa and Zetia. Will hold Zetia as discussed below as his wife believes he had intolerances to this in the past as well. For now, continue Coreg 25 mg twice daily, ASA 81 mg daily, Plavix 75 mg daily, Pravastatin 80 mg daily, Ranexa 500 mg twice daily and Repatha. Previously intolerant to Imdur due to dizziness.   2. HTN - Blood pressure is well-controlled at 126/60 during today's visit. Continue current medical therapy with Coreg 25 mg twice daily.  3. HLD - His LDL was at 74 when checked in 05/2023. He was previously intolerant to higher intensity statins due to myalgias and has been on Pravastatin 80 mg daily, Zetia 10 mg daily and Repatha. His wife reports Repatha was over $200 on most recent fill and will check with pharmacy it is appears he previously required a prior authorization for this which expired in 05/2023. As discussed above, will stop Zetia  10 mg daily for at least the next few weeks to see if his leg discomfort improves with this.  4. History of Anoxic Brain Injury - Most history is provided by his wife in the setting of his short-term memory loss.  Signed, Ellsworth Lennox, PA-C

## 2023-08-13 ENCOUNTER — Telehealth: Payer: Self-pay | Admitting: Pharmacy Technician

## 2023-08-13 ENCOUNTER — Other Ambulatory Visit (HOSPITAL_COMMUNITY): Payer: Self-pay

## 2023-08-13 ENCOUNTER — Encounter: Payer: Self-pay | Admitting: Pharmacy Technician

## 2023-08-13 NOTE — Telephone Encounter (Signed)
Pharmacy Patient Advocate Encounter  Received notification from [facility] that Prior Authorization for repatha has been DENIED.  Process primary insurance and then [place]. PA not needed   PA #/Case ID/Reference #: [id number]

## 2023-08-13 NOTE — Telephone Encounter (Signed)
Pharmacy Patient Advocate Encounter   Received notification from Physician's Office that prior authorization for Repatha is required/requested.   Insurance verification completed.   The patient is insured through  [employer]  .   Per test claim: The current 08/13/23 day co-pay is, $24.99- one month.  No PA needed at this time. This test claim was processed through [facility]- copay amounts may vary at other pharmacies due to pharmacy/plan contracts, or as the patient moves through the different stages of their insurance plan.

## 2023-08-13 NOTE — Telephone Encounter (Signed)
   Gave pharmacy info and sent mychart to patient

## 2023-08-13 NOTE — Telephone Encounter (Signed)
Pharmacy Patient Advocate Encounter   Received notification from Physician's Office that prior authorization for repatha is required/requested.   Insurance verification completed.   The patient is insured through [employer] .   Per test claim: PA required; PA submitted to above mentioned insurance via CoverMyMeds Key/confirmation #/EOC [facility] Status is pending

## 2023-10-07 ENCOUNTER — Other Ambulatory Visit: Payer: Self-pay

## 2023-10-07 ENCOUNTER — Encounter (HOSPITAL_BASED_OUTPATIENT_CLINIC_OR_DEPARTMENT_OTHER): Payer: Self-pay

## 2023-10-07 ENCOUNTER — Emergency Department (HOSPITAL_BASED_OUTPATIENT_CLINIC_OR_DEPARTMENT_OTHER)
Admission: EM | Admit: 2023-10-07 | Discharge: 2023-10-07 | Disposition: A | Discharge status: Home / Self Care | Attending: Emergency Medicine | Admitting: Emergency Medicine

## 2023-10-07 DIAGNOSIS — Z7902 Long term (current) use of antithrombotics/antiplatelets: Secondary | ICD-10-CM | POA: Diagnosis not present

## 2023-10-07 DIAGNOSIS — I10 Essential (primary) hypertension: Secondary | ICD-10-CM | POA: Diagnosis not present

## 2023-10-07 DIAGNOSIS — Z79899 Other long term (current) drug therapy: Secondary | ICD-10-CM | POA: Insufficient documentation

## 2023-10-07 DIAGNOSIS — I2510 Atherosclerotic heart disease of native coronary artery without angina pectoris: Secondary | ICD-10-CM | POA: Diagnosis not present

## 2023-10-07 DIAGNOSIS — R1012 Left upper quadrant pain: Secondary | ICD-10-CM | POA: Diagnosis present

## 2023-10-07 DIAGNOSIS — Z7982 Long term (current) use of aspirin: Secondary | ICD-10-CM | POA: Insufficient documentation

## 2023-10-07 DIAGNOSIS — R109 Unspecified abdominal pain: Secondary | ICD-10-CM

## 2023-10-07 DIAGNOSIS — R079 Chest pain, unspecified: Secondary | ICD-10-CM | POA: Diagnosis not present

## 2023-10-07 LAB — CBC WITH DIFFERENTIAL/PLATELET
Abs Immature Granulocytes: 0.01 10*3/uL (ref 0.00–0.07)
Basophils Absolute: 0 10*3/uL (ref 0.0–0.1)
Basophils Relative: 1 %
Eosinophils Absolute: 0.4 10*3/uL (ref 0.0–0.5)
Eosinophils Relative: 7 %
HCT: 39.2 % (ref 39.0–52.0)
Hemoglobin: 13.2 g/dL (ref 13.0–17.0)
Immature Granulocytes: 0 %
Lymphocytes Relative: 36 %
Lymphs Abs: 2.2 10*3/uL (ref 0.7–4.0)
MCH: 29.1 pg (ref 26.0–34.0)
MCHC: 33.7 g/dL (ref 30.0–36.0)
MCV: 86.5 fL (ref 80.0–100.0)
Monocytes Absolute: 0.6 10*3/uL (ref 0.1–1.0)
Monocytes Relative: 9 %
Neutro Abs: 2.9 10*3/uL (ref 1.7–7.7)
Neutrophils Relative %: 47 %
Platelets: 227 10*3/uL (ref 150–400)
RBC: 4.53 MIL/uL (ref 4.22–5.81)
RDW: 12.2 % (ref 11.5–15.5)
WBC: 6 10*3/uL (ref 4.0–10.5)
nRBC: 0 % (ref 0.0–0.2)

## 2023-10-07 LAB — COMPREHENSIVE METABOLIC PANEL WITH GFR
ALT: 22 U/L (ref 0–44)
AST: 17 U/L (ref 15–41)
Albumin: 4.3 g/dL (ref 3.5–5.0)
Alkaline Phosphatase: 132 U/L — ABNORMAL HIGH (ref 38–126)
Anion gap: 8 (ref 5–15)
BUN: 16 mg/dL (ref 8–23)
CO2: 26 mmol/L (ref 22–32)
Calcium: 9 mg/dL (ref 8.9–10.3)
Chloride: 103 mmol/L (ref 98–111)
Creatinine, Ser: 0.94 mg/dL (ref 0.61–1.24)
GFR, Estimated: >60 mL/min (ref >60)
Glucose, Bld: 113 mg/dL — ABNORMAL HIGH (ref 70–99)
Potassium: 3.9 mmol/L (ref 3.5–5.1)
Sodium: 137 mmol/L (ref 135–145)
Total Bilirubin: 0.5 mg/dL (ref 0.0–1.2)
Total Protein: 6.6 g/dL (ref 6.5–8.1)

## 2023-10-07 LAB — TROPONIN I (HIGH SENSITIVITY): Troponin I (High Sensitivity): 10 ng/L (ref <18)

## 2023-10-07 NOTE — Discharge Instructions (Signed)
 [name] Cummings was seen in the emergency department for abdominal pain His blood work and EKG looked okay He was not having any abdominal pain and did not have any pain when I felt his abdomen It is important that he follows up with his gastroenterologist as scheduled for next week He should return to the emergency department for severe pain, if he is unable to eat or drink or if there are any other concerns

## 2023-10-07 NOTE — ED Provider Notes (Signed)
 [place] EMERGENCY DEPARTMENT AT [facility] Provider Note   CSN: [id number] Arrival date & time: 10/07/23  1835     History  Chief Complaint  Patient presents with   Chest Pain   Abdominal Pain    Pt is complaining of left sided upper abdominal to lower chest with tingling down the left arm.     [name] Cummings is a 63 y.o. male.  With a past medical history of hypertension, CAD, seizure disorder and costochondritis who presents to the ED for abdominal pain.  Patient first experienced left-sided chest pain and left abdominal pain and swelling 4 days ago which has come and gone since the onset.  He denies current abdominal pain.  No current chest pain shortness of breath nausea, vomiting, change in bowel habits, fevers or chills.  He has an upcoming appointment to discuss this with his GI doctor in 6 days.   Chest Pain Associated symptoms: abdominal pain   Abdominal Pain Associated symptoms: chest pain        Home Medications Prior to Admission medications   Medication Sig Start Date End Date Taking? Authorizing Provider  aspirin EC 81 MG tablet Take 81 mg by mouth daily.    [provider]  busPIRone (BUSPAR) 30 MG tablet Take 30 mg by mouth 2 (two) times daily. 01/29/20   [provider]  carvedilol (COREG) 25 MG tablet Take 1 tablet (25 mg total) 2 (two) times daily with a meal by mouth. 05/15/17   Bhagat, Bhavinkumar, PA  clopidogrel (PLAVIX) 75 MG tablet Take 1 tablet (75 mg total) daily by mouth. Same day PCI 05/15/17   Manson Passey, PA  Evolocumab (REPATHA SURECLICK) 140 MG/ML SOAJ INJECT 140 MG INTO THE SKIN EVERY 14 DAYS 04/24/23   Antoine Poche, MD  FLUoxetine (PROZAC) 20 MG capsule Take 20 mg by mouth 2 (two) times daily. 04/20/14   [provider]  fluticasone (FLONASE) 50 MCG/ACT nasal spray Place 1 spray into both nostrils daily as needed for allergies.  04/20/14   [provider]  HYDROcodone-acetaminophen  (NORCO) 7.5-325 MG tablet Take 1 tablet by mouth 5 (five) times daily. 07/26/20   [provider]  loratadine (CLARITIN) 10 MG tablet Take 10 mg by mouth in the morning.    [provider]  nitroGLYCERIN (NITROSTAT) 0.4 MG SL tablet Place 1 tablet (0.4 mg total) under the tongue every 5 (five) minutes as needed for chest pain. 11/11/21   Antoine Poche, MD  ondansetron (ZOFRAN-ODT) 4 MG disintegrating tablet Take 4 mg by mouth every 8 (eight) hours as needed. 01/31/23   [provider]  oxybutynin (DITROPAN) 5 MG tablet Take 5 mg by mouth in the morning and at bedtime.    [provider]  pantoprazole (PROTONIX) 40 MG tablet Take 1 tablet (40 mg total) daily by mouth. 05/15/17   Bhagat, Bhavinkumar, PA  pravastatin (PRAVACHOL) 80 MG tablet Take 80 mg by mouth every evening.    [provider]  ranolazine (RANEXA) 500 MG 12 hr tablet Take 1 tablet (500 mg total) by mouth 2 (two) times daily. 06/30/23   Antoine Poche, MD  risperiDONE (RISPERDAL) 0.5 MG tablet Take 0.5 mg by mouth at bedtime. 08/05/23   [provider]  VENTOLIN HFA 108 (90 Base) MCG/ACT inhaler Inhale 1-2 puffs into the lungs every 6 (six) hours as needed for wheezing or shortness of breath.  07/25/18   [provider]  Vitamin D, Ergocalciferol, (DRISDOL)  1.25 MG (50000 UT) CAPS capsule Take 50,000 Units by mouth every Tuesday.    [provider]      Allergies    Sulfamethoxazole-trimethoprim, Aripiprazole, Crestor [rosuvastatin calcium], Duloxetine, Hydromorphone hcl, Imdur [isosorbide nitrate], Lorazepam, Pseudoephedrine, Terfenadine, Atorvastatin, and Biaxin [clarithromycin]    Review of Systems   Review of Systems  Cardiovascular:  Positive for chest pain.  Gastrointestinal:  Positive for abdominal pain.    Physical Exam Updated Vital Signs BP (!) 141/66   Pulse (!) 54   Temp 97.7 F (36.5 C)   Resp 19   Ht 6\' 2"  (1.88 m)   Wt 102.1 kg   SpO2  98%   BMI 28.89 kg/m  Physical Exam Vitals and nursing note reviewed.  HENT:     Head: Normocephalic and atraumatic.  Eyes:     Pupils: Pupils are equal, round, and reactive to light.  Cardiovascular:     Rate and Rhythm: Normal rate and regular rhythm.  Pulmonary:     Effort: Pulmonary effort is normal.     Breath sounds: Normal breath sounds.  Abdominal:     Palpations: Abdomen is soft. There is no mass.     Tenderness: There is no abdominal tenderness. There is no guarding or rebound.  Skin:    General: Skin is warm and dry.  Neurological:     Mental Status: He is alert.  Psychiatric:        Mood and Affect: Mood normal.     ED Results / Procedures / Treatments   Labs (all labs ordered are listed, but only abnormal results are displayed) Labs Reviewed  COMPREHENSIVE METABOLIC PANEL WITH GFR - Abnormal; Notable for the following components:      Result Value   Glucose, Bld 113 (*)    Alkaline Phosphatase 132 (*)    All other components within normal limits  CBC WITH DIFFERENTIAL/PLATELET  TROPONIN I (HIGH SENSITIVITY)  TROPONIN I (HIGH SENSITIVITY)    EKG EKG Interpretation Date/Time:  Wednesday October 07 2023 19:02:11 EDT Ventricular Rate:  57 PR Interval:  198 QRS Duration:  90 QT Interval:  430 QTC Calculation: 418 R Axis:   31  Text Interpretation: Sinus bradycardia Septal infarct (cited on or before 09-Jun-2023) Abnormal ECG When compared with ECG of 09-Jun-2023 11:06, No significant change was found Confirmed by Estelle June [phone]) on 10/07/2023 8:14:59 PM  Radiology No results found.  Procedures Procedures    Medications Ordered in ED Medications - No data to display  ED Course/ Medical Decision Making/ A&P                                 Medical Decision Making 63 year old male with history as above presenting for left-sided abdominal pain and swelling.  Some left-sided chest pain reported as well.  No complaints currently.  He is afebrile  and well-appearing here.  Physical exam is benign and nontender abdomen with no masses rebound rigidity or guarding.  Obtained positive troponin given concern for potential cardiac etiology.  I sent a troponin of 10 is not consistent with ACS given reported intermittent chest pain for the last 10 days.  EKG shows no new new ischemic changes or dysrhythmia.  CMP and CBC are unremarkable overall.  Low suspicion for acute intra-abdominal process at this time.  He has close GI follow-up and will follow-up early next week.  Return precautions were discussed with him and his family  member in detail  Amount and/or Complexity of Data Reviewed Labs: ordered.           Final Clinical Impression(s) / ED Diagnoses Final diagnoses:  Abdominal pain, unspecified abdominal location    Rx / DC Orders ED Discharge Orders     None         Royanne Foots, DO 10/07/23 2111

## 2023-10-07 NOTE — ED Triage Notes (Signed)
 Stood up from chair and fell forward onto brick. Eliquis- afib. No LOC. Denies neck and back pain. . Teeth, tongue intact. Lacerations to face. Right arm skin tear.

## 2023-10-13 ENCOUNTER — Other Ambulatory Visit (HOSPITAL_COMMUNITY): Payer: Self-pay | Admitting: Gastroenterology

## 2023-10-13 DIAGNOSIS — R1032 Left lower quadrant pain: Secondary | ICD-10-CM

## 2023-10-19 ENCOUNTER — Ambulatory Visit (HOSPITAL_BASED_OUTPATIENT_CLINIC_OR_DEPARTMENT_OTHER)
Admission: RE | Admit: 2023-10-19 | Discharge: 2023-10-19 | Disposition: A | Discharge status: Home / Self Care | Source: Ambulatory Visit | Attending: Gastroenterology | Admitting: Gastroenterology

## 2023-10-19 DIAGNOSIS — R1032 Left lower quadrant pain: Secondary | ICD-10-CM | POA: Diagnosis present

## 2023-10-19 MED ORDER — IOHEXOL 300 MG/ML  SOLN
100.0000 mL | Freq: Once | INTRAMUSCULAR | Status: AC | PRN
  Administered 2023-10-19: 100 mL via INTRAVENOUS

## 2023-11-26 ENCOUNTER — Ambulatory Visit: Payer: 59 | Admitting: Student

## 2023-12-15 ENCOUNTER — Telehealth: Payer: Self-pay | Admitting: Cardiology

## 2023-12-15 MED ORDER — CARVEDILOL 25 MG PO TABS: 25.0000 mg | ORAL_TABLET | Freq: Two times a day (BID) | ORAL | 2 refills | Status: AC

## 2023-12-15 NOTE — Telephone Encounter (Signed)
*  STAT* If patient is at the pharmacy, call can be transferred to refill team.   1. Which medications need to be refilled? (please list name of each medication and dose if known) carvedilol  (COREG ) 25 MG tablet   2. Which pharmacy/location (including street and city if local pharmacy) is medication to be sent to? [facility] [address], [place] - [address]   3. Do they need a 30 day or 90 day supply? 90

## 2023-12-15 NOTE — Telephone Encounter (Signed)
 Pt's medication was sent to pt's pharmacy as requested. Confirmation received.

## 2023-12-31 ENCOUNTER — Ambulatory Visit: Attending: Student | Admitting: Student

## 2023-12-31 ENCOUNTER — Encounter: Payer: Self-pay | Admitting: Student

## 2023-12-31 VITALS — BP 116/60 | HR 62 | Ht 74.0 in | Wt 235.0 lb

## 2023-12-31 DIAGNOSIS — E785 Hyperlipidemia, unspecified: Secondary | ICD-10-CM

## 2023-12-31 DIAGNOSIS — G931 Anoxic brain damage, not elsewhere classified: Secondary | ICD-10-CM

## 2023-12-31 DIAGNOSIS — I25118 Atherosclerotic heart disease of native coronary artery with other forms of angina pectoris: Secondary | ICD-10-CM | POA: Diagnosis not present

## 2023-12-31 DIAGNOSIS — I10 Essential (primary) hypertension: Secondary | ICD-10-CM | POA: Diagnosis not present

## 2023-12-31 MED ORDER — NITROGLYCERIN 0.4 MG SL SUBL
0.4000 mg | SUBLINGUAL_TABLET | SUBLINGUAL | 3 refills | Status: AC | PRN
Start: 2023-12-31 — End: ?

## 2023-12-31 MED ORDER — REPATHA SURECLICK 140 MG/ML ~~LOC~~ SOAJ: 140.0000 mg | SUBCUTANEOUS | 6 refills | Status: DC

## 2023-12-31 MED ORDER — CLOPIDOGREL BISULFATE 75 MG PO TABS: 75.0000 mg | ORAL_TABLET | Freq: Every day | ORAL | 3 refills | Status: AC

## 2023-12-31 NOTE — Patient Instructions (Signed)
 Medication Instructions:  Your physician recommends that you continue on your current medications as directed. Please refer to the Current Medication list given to you today.  *If you need a refill on your cardiac medications before your next appointment, please call your pharmacy*  Lab Work: NONE   If you have labs (blood work) drawn today and your tests are completely normal, you will receive your results only by: MyChart Message (if you have MyChart) OR A paper copy in the mail If you have any lab test that is abnormal or we need to change your treatment, we will call you to review the results.  Testing/Procedures: NONE   Follow-Up: At [facility], you and your health needs are our priority.  As part of our continuing mission to provide you with exceptional heart care, our providers are all part of one team.  This team includes your primary Cardiologist (physician) and Advanced Practice Providers or APPs (Physician Assistants and Nurse Practitioners) who all work together to provide you with the care you need, when you need it.  Your next appointment:   5 -6 month(s)  Provider:   You may see Alvan Carrier, MD or one of the following Advanced Practice Providers on your designated Care Team:   Laymon Qua, PA-C  [place], [place] Olivia [place], [place]     We recommend signing up for the patient portal called MyChart.  Sign up information is provided on this After Visit Summary.  MyChart is used to connect with patients for Virtual Visits (Telemedicine).  Patients are able to view lab/test results, encounter notes, upcoming appointments, etc.  Non-urgent messages can be sent to your provider as well.   To learn more about what you can do with MyChart, go to [email or web address].   Other Instructions Thank you for choosing [facility]!

## 2023-12-31 NOTE — Progress Notes (Signed)
 Cardiology Office Note    Date:  12/31/2023  ID:  KEATYN JAWAD, DOB 06-Sep-1960, MRN [id number] Cardiologist: Alvan Carrier, MD    History of Present Illness:    [name] Cummings is a 63 y.o. male  with past medical history of CAD (s/p BMS to LAD in 2007 with stent thrombosis and VF arrest and LAD re-stented, DES to distal LAD in 2010, ISR and DES to LAD in 2017, DES to LCx in 04/2017 and severe ostial stenosis of small RCA, DES to proximal LAD in 05/2019, DES placement to distal LM/ostial LAD in 10/2021), anoxic brain injury (following VF arrest in 2007 with persistent short-term memory loss), HTN and HLD who presents to the office today for 54-month follow-up.  He was last examined by myself in 08/2023 and his recent stress test in 05/2023 showed scar with moderate peri-infarct ischemia and was an intermediate-risk study. He had recently been started on Ranexa  500 mg twice daily by Dr. Alvan and was recommended to reassess symptoms and if progressive symptoms, would need to consider repeat cardiac catheterization. At the time of his visit, his wife provided most of the history but they reported that his pain had significantly improved since starting Ranexa . He did report worsening leg pain and this was felt to possibly be due to Zetia . Therefore, Zetia  was discontinued and he was continued on Pravastatin  80 mg daily and Repatha . Was also continued on ASA 81 mg daily, Plavix  75 mg daily and Coreg  25 mg twice daily.  In talking with the patient and his wife today, they report things have been stable since his last office visit. He did stop Zetia  and Ranexa  to see if this would help with his leg pain and symptoms did not improve. However, he did not restart the medications. Reports that his pain has overall been well-controlled and he has only had to utilize SL NTGx1 within the past month. No recent dyspnea on exertion, palpitations, orthopnea, PND or pitting edema. Blood pressure is well-controlled at  116/60 during today's visit and has been well-controlled when checked at home.  Studies Reviewed:   EKG: EKG is not ordered today.  Cardiac Catheterization: 10/2021 Successful stenting of severe distal left main and ostial LAD stenoses using a 4.0 x 16 mm Synergy DES using intravascular ultrasound guidance   Recommend: Same-day PCI protocol as long as no complications arise.  Continue dual antiplatelet therapy with aspirin  and clopidogrel  indefinitely.  NST: 05/2023   Findings are consistent with infarction with peri-infarct ischemia. The study is intermediate risk.   No ST deviation was noted. The ECG was negative for ischemia.   LV perfusion is abnormal.  Large sized, mild to moderate intensity, apical to basal lateral defect that exhibits partial reversibility.  Apical portion of the defect is largely fixed.  Suggestive of scar with moderate peri-infarct ischemia.   Left ventricular function is abnormal. Nuclear stress EF: 48%.   Overall intermediate risk study with lateral scar and moderate peri-infarct ischemia, LVEF 48%.   Physical Exam:   VS:  BP 116/60 (BP Location: Right Arm, Cuff Size: Normal)   Pulse 62   Ht 6' 2 (1.88 m)   Wt 235 lb (106.6 kg)   SpO2 96%   BMI 30.17 kg/m    Wt Readings from Last 3 Encounters:  12/31/23 235 lb (106.6 kg)  10/07/23 225 lb (102.1 kg)  08/12/23 230 lb (104.3 kg)     GEN: Pleasant male appearing in no acute distress NECK: No  JVD; No carotid bruits CARDIAC: RRR, no murmurs, rubs, gallops RESPIRATORY:  Clear to auscultation without rales, wheezing or rhonchi  ABDOMEN: Appears non-distended. No obvious abdominal masses. EXTREMITIES: No clubbing or cyanosis. No pitting edema.  Distal pedal pulses are 2+ bilaterally.   Assessment and Plan:   1. Coronary artery disease involving native coronary artery of native heart with other form of angina pectoris [facility]) - He has a history of multiple interventions as discussed above and recent NST  in 05/2023 showed moderate peri-infarct ischemia and antianginal therapy was titrated at that time. - Anginal symptoms have overall been well-controlled since his last office visit despite no longer being on Ranexa . Will continue current medical therapy for now with ASA 81mg  daily, Plavix  75mg  daily, Coreg  25mg  BID, Pravastatin  80mg  daily and Repatha . I encouraged his wife to make us  aware if he starts to experience more frequent episodes of chest pain as we could restart Ranexa  500mg  BID. Was previously intolerant to Imdur .   2. Essential hypertension - BP is well-controlled at 116/60 during today's visit. Continue current medical therapy with Coreg  25 mg twice daily.  3 . Hyperlipidemia LDL goal <55 - Recent FLP in 11/2023 showed total cholesterol 132, triglycerides 69, HDL 57 and LDL 60. Continue current medical therapy with Repatha  and Pravastatin  80 mg daily. Previously intolerant to higher-intensity statin therapy.   4. Anoxic Brain Injury - He does have short-term memory loss and his wife provides a majority of the history during today's visit.  Signed, [name] CHRISTELLA Qua, PA-C

## 2024-01-01 ENCOUNTER — Encounter: Payer: Self-pay | Admitting: Student

## 2024-01-04 ENCOUNTER — Telehealth: Payer: Self-pay | Admitting: Pharmacy Technician

## 2024-01-04 NOTE — Telephone Encounter (Signed)
 Wife called back and is aware free

## 2024-01-04 NOTE — Telephone Encounter (Signed)
   I called [facility] and this is now free. Sent mychart and called and left the wife a message

## 2024-04-29 ENCOUNTER — Other Ambulatory Visit: Payer: Self-pay | Admitting: Student

## 2024-05-03 MED ORDER — REPATHA SURECLICK 140 MG/ML ~~LOC~~ SOAJ: 140.0000 mg | SUBCUTANEOUS | 3 refills | Status: AC

## 2024-05-12 ENCOUNTER — Inpatient Hospital Stay (HOSPITAL_COMMUNITY)
Admission: EM | Admit: 2024-05-12 | Discharge: 2024-05-18 | DRG: 521 | Disposition: A | Discharge status: Home Health | Attending: Internal Medicine | Admitting: Internal Medicine

## 2024-05-12 ENCOUNTER — Other Ambulatory Visit: Payer: Self-pay

## 2024-05-12 ENCOUNTER — Emergency Department (HOSPITAL_COMMUNITY)

## 2024-05-12 DIAGNOSIS — Z87891 Personal history of nicotine dependence: Secondary | ICD-10-CM

## 2024-05-12 DIAGNOSIS — Z8674 Personal history of sudden cardiac arrest: Secondary | ICD-10-CM | POA: Diagnosis not present

## 2024-05-12 DIAGNOSIS — I2510 Atherosclerotic heart disease of native coronary artery without angina pectoris: Secondary | ICD-10-CM | POA: Diagnosis present

## 2024-05-12 DIAGNOSIS — Z888 Allergy status to other drugs, medicaments and biological substances status: Secondary | ICD-10-CM | POA: Diagnosis not present

## 2024-05-12 DIAGNOSIS — Z955 Presence of coronary angioplasty implant and graft: Secondary | ICD-10-CM | POA: Diagnosis not present

## 2024-05-12 DIAGNOSIS — E782 Mixed hyperlipidemia: Secondary | ICD-10-CM | POA: Diagnosis present

## 2024-05-12 DIAGNOSIS — Z79899 Other long term (current) drug therapy: Secondary | ICD-10-CM

## 2024-05-12 DIAGNOSIS — Z01810 Encounter for preprocedural cardiovascular examination: Secondary | ICD-10-CM | POA: Diagnosis not present

## 2024-05-12 DIAGNOSIS — W19XXXA Unspecified fall, initial encounter: Secondary | ICD-10-CM | POA: Diagnosis present

## 2024-05-12 DIAGNOSIS — Z7982 Long term (current) use of aspirin: Secondary | ICD-10-CM

## 2024-05-12 DIAGNOSIS — Z833 Family history of diabetes mellitus: Secondary | ICD-10-CM | POA: Diagnosis not present

## 2024-05-12 DIAGNOSIS — I25118 Atherosclerotic heart disease of native coronary artery with other forms of angina pectoris: Secondary | ICD-10-CM | POA: Diagnosis not present

## 2024-05-12 DIAGNOSIS — S50312A Abrasion of left elbow, initial encounter: Secondary | ICD-10-CM | POA: Diagnosis present

## 2024-05-12 DIAGNOSIS — R000 Tachycardia, unspecified: Secondary | ICD-10-CM | POA: Diagnosis present

## 2024-05-12 DIAGNOSIS — Y92009 Unspecified place in unspecified non-institutional (private) residence as the place of occurrence of the external cause: Secondary | ICD-10-CM | POA: Diagnosis not present

## 2024-05-12 DIAGNOSIS — Z87442 Personal history of urinary calculi: Secondary | ICD-10-CM | POA: Diagnosis not present

## 2024-05-12 DIAGNOSIS — G40909 Epilepsy, unspecified, not intractable, without status epilepticus: Secondary | ICD-10-CM | POA: Diagnosis present

## 2024-05-12 DIAGNOSIS — I252 Old myocardial infarction: Secondary | ICD-10-CM | POA: Diagnosis not present

## 2024-05-12 DIAGNOSIS — I255 Ischemic cardiomyopathy: Secondary | ICD-10-CM | POA: Diagnosis present

## 2024-05-12 DIAGNOSIS — I110 Hypertensive heart disease with heart failure: Secondary | ICD-10-CM | POA: Diagnosis present

## 2024-05-12 DIAGNOSIS — S72002A Fracture of unspecified part of neck of left femur, initial encounter for closed fracture: Principal | ICD-10-CM | POA: Diagnosis present

## 2024-05-12 DIAGNOSIS — S72012A Unspecified intracapsular fracture of left femur, initial encounter for closed fracture: Principal | ICD-10-CM | POA: Diagnosis present

## 2024-05-12 DIAGNOSIS — Z7902 Long term (current) use of antithrombotics/antiplatelets: Secondary | ICD-10-CM

## 2024-05-12 DIAGNOSIS — G931 Anoxic brain damage, not elsewhere classified: Secondary | ICD-10-CM | POA: Diagnosis present

## 2024-05-12 DIAGNOSIS — I493 Ventricular premature depolarization: Secondary | ICD-10-CM | POA: Diagnosis present

## 2024-05-12 DIAGNOSIS — I509 Heart failure, unspecified: Secondary | ICD-10-CM | POA: Diagnosis not present

## 2024-05-12 DIAGNOSIS — Z881 Allergy status to other antibiotic agents status: Secondary | ICD-10-CM

## 2024-05-12 DIAGNOSIS — K219 Gastro-esophageal reflux disease without esophagitis: Secondary | ICD-10-CM | POA: Diagnosis present

## 2024-05-12 DIAGNOSIS — Z8249 Family history of ischemic heart disease and other diseases of the circulatory system: Secondary | ICD-10-CM

## 2024-05-12 DIAGNOSIS — Z8679 Personal history of other diseases of the circulatory system: Secondary | ICD-10-CM | POA: Diagnosis not present

## 2024-05-12 DIAGNOSIS — Z882 Allergy status to sulfonamides status: Secondary | ICD-10-CM

## 2024-05-12 DIAGNOSIS — J9601 Acute respiratory failure with hypoxia: Secondary | ICD-10-CM | POA: Diagnosis not present

## 2024-05-12 DIAGNOSIS — T40605A Adverse effect of unspecified narcotics, initial encounter: Secondary | ICD-10-CM | POA: Diagnosis not present

## 2024-05-12 DIAGNOSIS — I10 Essential (primary) hypertension: Secondary | ICD-10-CM | POA: Diagnosis not present

## 2024-05-12 DIAGNOSIS — D62 Acute posthemorrhagic anemia: Secondary | ICD-10-CM | POA: Diagnosis not present

## 2024-05-12 DIAGNOSIS — I25110 Atherosclerotic heart disease of native coronary artery with unstable angina pectoris: Secondary | ICD-10-CM

## 2024-05-12 DIAGNOSIS — Z8669 Personal history of other diseases of the nervous system and sense organs: Secondary | ICD-10-CM

## 2024-05-12 LAB — PROTIME-INR
INR: 1 (ref 0.8–1.2)
Prothrombin Time: 13.6 s (ref 11.4–15.2)

## 2024-05-12 LAB — COMPREHENSIVE METABOLIC PANEL WITH GFR
ALT: 19 U/L (ref 0–44)
AST: 24 U/L (ref 15–41)
Albumin: 4.5 g/dL (ref 3.5–5.0)
Alkaline Phosphatase: 136 U/L — ABNORMAL HIGH (ref 38–126)
Anion gap: 10 (ref 5–15)
BUN: 19 mg/dL (ref 8–23)
CO2: 26 mmol/L (ref 22–32)
Calcium: 9.2 mg/dL (ref 8.9–10.3)
Chloride: 103 mmol/L (ref 98–111)
Creatinine, Ser: 1.03 mg/dL (ref 0.61–1.24)
GFR, Estimated: >60 mL/min (ref >60)
Glucose, Bld: 105 mg/dL — ABNORMAL HIGH (ref 70–99)
Potassium: 4.4 mmol/L (ref 3.5–5.1)
Sodium: 139 mmol/L (ref 135–145)
Total Bilirubin: 0.6 mg/dL (ref 0.0–1.2)
Total Protein: 6.8 g/dL (ref 6.5–8.1)

## 2024-05-12 LAB — CBC WITH DIFFERENTIAL/PLATELET
Abs Immature Granulocytes: 0.04 K/uL (ref 0.00–0.07)
Basophils Absolute: 0.1 K/uL (ref 0.0–0.1)
Basophils Relative: 1 %
Eosinophils Absolute: 0.4 K/uL (ref 0.0–0.5)
Eosinophils Relative: 5 %
HCT: 41.2 % (ref 39.0–52.0)
Hemoglobin: 14 g/dL (ref 13.0–17.0)
Immature Granulocytes: 1 %
Lymphocytes Relative: 19 %
Lymphs Abs: 1.5 K/uL (ref 0.7–4.0)
MCH: 29.5 pg (ref 26.0–34.0)
MCHC: 34 g/dL (ref 30.0–36.0)
MCV: 86.7 fL (ref 80.0–100.0)
Monocytes Absolute: 0.6 K/uL (ref 0.1–1.0)
Monocytes Relative: 8 %
Neutro Abs: 5.3 K/uL (ref 1.7–7.7)
Neutrophils Relative %: 66 %
Platelets: 240 K/uL (ref 150–400)
RBC: 4.75 MIL/uL (ref 4.22–5.81)
RDW: 11.8 % (ref 11.5–15.5)
WBC: 7.9 K/uL (ref 4.0–10.5)
nRBC: 0 % (ref 0.0–0.2)

## 2024-05-12 MED ORDER — MORPHINE SULFATE (PF) 4 MG/ML IV SOLN
4.0000 mg | Freq: Once | INTRAVENOUS | Status: AC
  Administered 2024-05-12: 4 mg via INTRAVENOUS
  Filled 2024-05-12: qty 1

## 2024-05-12 MED ORDER — SODIUM CHLORIDE 0.9 % IV BOLUS
1000.0000 mL | Freq: Once | INTRAVENOUS | Status: AC
  Administered 2024-05-12: 1000 mL via INTRAVENOUS

## 2024-05-12 MED ORDER — MORPHINE SULFATE (PF) 4 MG/ML IV SOLN
4.0000 mg | INTRAVENOUS | Status: DC | PRN
Start: 2024-05-12 — End: 2024-05-14
  Administered 2024-05-13 (×3): 4 mg via INTRAVENOUS
  Filled 2024-05-12 (×3): qty 1

## 2024-05-12 MED ORDER — ONDANSETRON HCL 4 MG/2ML IJ SOLN
4.0000 mg | Freq: Once | INTRAMUSCULAR | Status: AC
  Administered 2024-05-12: 4 mg via INTRAVENOUS
  Filled 2024-05-12: qty 2

## 2024-05-12 NOTE — ED Provider Notes (Signed)
 [place] EMERGENCY DEPARTMENT AT [facility] Provider Note  CSN: [id number] Arrival date & time: 05/12/24 1741  Chief Complaint(s) Fall and Hip Injury  HPI [name] Cummings is a 63 y.o. male history of coronary artery disease, CHF, hypertension presenting to the emergency department with fall.  Patient was walking outside, had a fall.  Son was nearby but did not see him fall.  Son found him on the ground.  Noticed some abrasions to the left side.  He was not able to stand or walk.  Paramedics were called.  Patient reports that he has left hip pain.  When asked about his fall he confabulates.   Past Medical History Past Medical History:  Diagnosis Date   Anxiety    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   CAD (coronary artery disease)    a. s/p BMS to LAD in 2007 with stent thrombosis and VF arrest and LAD re-stented b. DES to distal LAD in 2010 c. ISR and DES to LAD in 2017 d. DES to LCx in 04/2017 and severe ostial stenosis of small RCA e. 05/2019: DES to proximal LAD.    CHF (congestive heart failure) (HCC)    Depression    Dizziness    Dysphagia, unspecified(787.20)    GERD (gastroesophageal reflux disease)    History of seizure disorder    Hyperlipidemia, mixed    Hypertension    Hypertensive heart disease    unspecified   Ischemic cardiomyopathy    a. 06/2014 Echo: EF 60-65%.   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   Rash 08/28/2018   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   Demand ischemia (HCC) 08/28/2018   Pain in thoracic spine 04/14/2018   Dizziness 01/25/2018   Excessive sleepiness 01/25/2018   Imbalance 06/09/2017   Impacted cerumen of right ear 06/09/2017    Tinnitus of right ear 06/09/2017   Abnormal nuclear cardiac imaging test 05/15/2017   Physical exam, annual 09/05/2015   Numbness and tingling of left side of face 08/16/2015   Left sided numbness 08/15/2015   Anxiety 07/21/2015   Unstable angina (HCC) 07/21/2015   Hypertensive heart disease    Hyperlipidemia    CAD (coronary artery disease)    Brain anoxic injury (HCC)    Progressive angina (HCC)    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/03/2011   CAD 07/16/2009   ANOXIC BRAIN DAMAGE 01/04/2009   Hyperlipidemia LDL goal <70 11/10/2008   Anxiety state 11/10/2008   Depression 11/10/2008   Essential hypertension 11/10/2008   Ischemic cardiomyopathy 11/10/2008   History of ventricular fibrillation 11/10/2008   Dysphagia 11/10/2008   SEIZURE DISORDER, HX OF 11/10/2008   Dysphagia 11/10/2008   Home Medication(s) Prior to Admission medications   Medication Sig Start Date End Date Taking? Authorizing Provider  aspirin  EC 81 MG tablet Take 81 mg by mouth daily.    [provider]  busPIRone (BUSPAR) 30 MG tablet Take 30 mg by mouth 2 (two) times daily. 01/29/20   [provider]  carvedilol  (COREG ) 25 MG tablet Take 1 tablet (25 mg total) by mouth 2 (two) times daily with a meal. 12/15/23  Alvan Dorn FALCON, MD  clopidogrel  (PLAVIX ) 75 MG tablet Take 1 tablet (75 mg total) by mouth daily. 12/31/23   Strader, Laymon HERO, PA-C  Evolocumab  (REPATHA  SURECLICK) 140 MG/ML SOAJ Inject 140 mg into the skin every 14 (fourteen) days. 05/03/24   Alvan Dorn FALCON, MD  FLUoxetine  (PROZAC ) 20 MG capsule Take 20 mg by mouth 2 (two) times daily. 04/20/14   [provider]  fluticasone  (FLONASE ) 50 MCG/ACT nasal spray Place 1 spray into both nostrils daily as needed for allergies.  04/20/14   [provider]  HYDROcodone -acetaminophen  (NORCO) 7.5-325 MG tablet Take 1 tablet by mouth 5 (five) times daily. 07/26/20   [provider]  loratadine  (CLARITIN ) 10 MG tablet  Take 10 mg by mouth in the morning.    [provider]  nitroGLYCERIN  (NITROSTAT ) 0.4 MG SL tablet Place 1 tablet (0.4 mg total) under the tongue every 5 (five) minutes as needed for chest pain. 12/31/23   Strader, Laymon HERO, PA-C  ondansetron  (ZOFRAN -ODT) 4 MG disintegrating tablet Take 4 mg by mouth every 8 (eight) hours as needed. 01/31/23   [provider]  oxybutynin (DITROPAN) 5 MG tablet Take 5 mg by mouth in the morning and at bedtime.    [provider]  pantoprazole  (PROTONIX ) 40 MG tablet Take 1 tablet (40 mg total) daily by mouth. 05/15/17   Bhagat, Bhavinkumar, PA  pravastatin  (PRAVACHOL ) 80 MG tablet Take 80 mg by mouth every evening.    [provider]  REXULTI 1 MG TABS tablet Take 1 mg by mouth daily. 12/17/23   [provider]  risperiDONE (RISPERDAL) 0.5 MG tablet Take 0.5 mg by mouth at bedtime. 08/05/23   [provider]  VENTOLIN HFA 108 (90 Base) MCG/ACT inhaler Inhale 1-2 puffs into the lungs every 6 (six) hours as needed for wheezing or shortness of breath.  07/25/18   [provider]  Vitamin D, Ergocalciferol, (DRISDOL) 1.25 MG (50000 UT) CAPS capsule Take 50,000 Units by mouth every Tuesday.    [provider]                                                                                                                                    Past Surgical History Past Surgical History:  Procedure Laterality Date   bedside tracheostomy     with #6 [place]. 04/14/2006   CARDIAC CATHETERIZATION  09/2005   with bare metal stent to the mid LAD (Minivision 2.5 x 18 mm) with Provisional balloon angioplasty to the second diagonal.   CARDIAC CATHETERIZATION  03/2006   with stenting of the mid LAD with Taxus 2.5 x 20 mm and 2.5 x 8 mm stents.       CARDIAC CATHETERIZATION N/A 07/20/2015   Procedure: Left Heart Cath and Coronary Angiography;  Surgeon: Candyce GORMAN Reek, MD;  Location: [facility] INVASIVE CV LAB;  Service:  Cardiovascular;  Laterality: N/A;   CARDIAC  CATHETERIZATION N/A 07/20/2015   Procedure: Coronary Stent Intervention;  Surgeon: Candyce GORMAN Reek, MD;  Location: [facility] INVASIVE CV LAB;  Service: Cardiovascular;  Laterality: N/A;   COLONOSCOPY WITH PROPOFOL  N/A 12/05/2022   Procedure: COLONOSCOPY WITH PROPOFOL ;  Surgeon: Rollin Dover, MD;  Location: [facility] ENDOSCOPY;  Service: Gastroenterology;  Laterality: N/A;   CORONARY ANGIOGRAPHY N/A 11/06/2021   Procedure: CORONARY ANGIOGRAPHY;  Surgeon: Wonda Sharper, MD;  Location: [facility] INVASIVE CV LAB;  Service: Cardiovascular;  Laterality: N/A;   CORONARY STENT INTERVENTION N/A 05/15/2017   Procedure: CORONARY STENT INTERVENTION;  Surgeon: Wonda Sharper, MD;  Location: [facility] INVASIVE CV LAB;  Service: Cardiovascular;  Laterality: N/A;   CORONARY STENT INTERVENTION N/A 05/27/2019   Procedure: CORONARY STENT INTERVENTION;  Surgeon: Wonda Sharper, MD;  Location: [facility] INVASIVE CV LAB;  Service: Cardiovascular;  Laterality: N/A;   CORONARY STENT INTERVENTION N/A 11/06/2021   Procedure: CORONARY STENT INTERVENTION;  Surgeon: Wonda Sharper, MD;  Location: [facility] INVASIVE CV LAB;  Service: Cardiovascular;  Laterality: N/A;   CORONARY STENT PLACEMENT  07/20/2015   LAD with DES   ESOPHAGOGASTRODUODENOSCOPY     KIDNEY STONE SURGERY     LEFT HEART CATH AND CORONARY ANGIOGRAPHY N/A 05/15/2017   Procedure: LEFT HEART CATH AND CORONARY ANGIOGRAPHY;  Surgeon: Wonda Sharper, MD;  Location: [facility] INVASIVE CV LAB;  Service: Cardiovascular;  Laterality: N/A;   LEFT HEART CATH AND CORONARY ANGIOGRAPHY N/A 05/27/2019   Procedure: LEFT HEART CATH AND CORONARY ANGIOGRAPHY;  Surgeon: Wonda Sharper, MD;  Location: [facility] INVASIVE CV LAB;  Service: Cardiovascular;  Laterality: N/A;   LEFT HEART CATH AND CORONARY ANGIOGRAPHY N/A 10/21/2021   Procedure: LEFT HEART CATH AND CORONARY ANGIOGRAPHY;  Surgeon: Mady Bruckner, MD;  Location: [facility] INVASIVE CV LAB;  Service: Cardiovascular;  Laterality:  N/A;   PEG TUBE PLACEMENT     PEG TUBE REMOVAL     POLYPECTOMY  12/05/2022   Procedure: POLYPECTOMY;  Surgeon: Rollin Dover, MD;  Location: [facility] ENDOSCOPY;  Service: Gastroenterology;;   stent thrombosis  03/2006   Complicated my sudden cardiac death   TRACHEOSTOMY CLOSURE     Family History Family History  Problem Relation Age of Onset   Heart attack Mother    Diabetes Mother    Hydrocephalus Mother    Heart attack Father    Cancer Father        unsure of type   Diabetes Sister    Coronary artery disease Neg Hx        Siblings    Social History Social History   Tobacco Use   Smoking status: Never   Smokeless tobacco: Former    Types: Chew    Quit date: 09/03/2005  Vaping Use   Vaping status: Never Used  Substance Use Topics   Alcohol use: No    Alcohol/week: 0.0 standard drinks of alcohol   Drug use: No   Allergies Sulfamethoxazole-trimethoprim, Aripiprazole, Crestor  [rosuvastatin  calcium ], Duloxetine, Hydromorphone hcl, Imdur  [isosorbide  nitrate], Lorazepam, Pseudoephedrine, Terfenadine, Atorvastatin , and Biaxin [clarithromycin]  Review of Systems Review of Systems  All other systems reviewed and are negative.   Physical Exam Vital Signs  I have reviewed the triage vital signs BP (!) 146/73   Pulse 82   Resp (!) 27   Ht 6' 2 (1.88 m)   Wt 106.6 kg   SpO2 90%   BMI 30.17 kg/m  Physical Exam Vitals and nursing note reviewed.  Constitutional:      General: He is not in acute distress.  Appearance: Normal appearance.  HENT:     Head: Normocephalic and atraumatic.     Mouth/Throat:     Mouth: Mucous membranes are moist.  Eyes:     Conjunctiva/sclera: Conjunctivae normal.  Cardiovascular:     Rate and Rhythm: Normal rate and regular rhythm.  Pulmonary:     Effort: Pulmonary effort is normal. No respiratory distress.     Breath sounds: Normal breath sounds.  Abdominal:     General: Abdomen is flat.     Palpations: Abdomen is soft.     Tenderness:  There is no abdominal tenderness.  Musculoskeletal:     Right lower leg: No edema.     Left lower leg: No edema.     Comments: No midline C, T, L-spine tenderness.  Full active range of motion of bilateral upper extremities, no focal tenderness or deformity.  Small abrasions of the left elbow.  Full active range of motion of the right lower extremity hip, knee, ankle, foot.  Left lower extremity with severely diminished range of motion of the left hip with tenderness, no tenderness of the left knee, ankle.  2+ bilateral DP pulse  Skin:    General: Skin is warm and dry.     Capillary Refill: Capillary refill takes less than 2 seconds.  Neurological:     Mental Status: He is alert and oriented to person, place, and time. Mental status is at baseline.  Psychiatric:        Mood and Affect: Mood normal.        Behavior: Behavior normal.     ED Results and Treatments Labs (all labs ordered are listed, but only abnormal results are displayed) Labs Reviewed  COMPREHENSIVE METABOLIC PANEL WITH GFR - Abnormal; Notable for the following components:      Result Value   Glucose, Bld 105 (*)    Alkaline Phosphatase 136 (*)    All other components within normal limits  CBC WITH DIFFERENTIAL/PLATELET  PROTIME-INR                                                                                                                          Radiology CT Cervical Spine Wo Contrast Result Date: 05/12/2024 EXAM: CT CERVICAL SPINE WITHOUT CONTRAST 05/12/2024 07:24:00 PM TECHNIQUE: CT of the cervical spine was performed without the administration of intravenous contrast. Multiplanar reformatted images are provided for review. Automated exposure control, iterative reconstruction, and/or weight based adjustment of the mA/kV was utilized to reduce the radiation dose to as low as reasonably achievable. COMPARISON: 10/23/2020 CLINICAL HISTORY: Recent fall with neck pain. FINDINGS: CERVICAL SPINE: BONES AND ALIGNMENT:  Loss of the normal cervical lordosis is noted. The odontoid is within normal limits. No acute fracture or acute facet abnormality is seen. DEGENERATIVE CHANGES: Multilevel osteophytic changes are seen. Multilevel facet hypertrophic changes are noted as well. SOFT TISSUES: Surrounding soft tissue structures are unremarkable. Visualized lung apices are within normal limits. IMPRESSION: 1. No acute fracture or acute  facet abnormality. 2. Loss of the normal cervical lordosis. 3. Multilevel osteophytic and facet hypertrophic changes. Electronically signed by: Oneil Devonshire MD 05/12/2024 07:30 PM EST RP Workstation: [id number]   CT Head Wo Contrast Result Date: 05/12/2024 EXAM: CT HEAD WITHOUT CONTRAST 05/12/2024 07:24:00 PM TECHNIQUE: CT of the head was performed without the administration of intravenous contrast. Automated exposure control, iterative reconstruction, and/or weight based adjustment of the mA/kV was utilized to reduce the radiation dose to as low as reasonably achievable. COMPARISON: 4 x 26 x 22 CLINICAL HISTORY: Head trauma, abnormal mental status (Age 3-64y). FINDINGS: BRAIN AND VENTRICLES: No acute hemorrhage. No evidence of acute infarct. Cerebral atrophy. Ventriculomegaly out of proportion to sulcal enlargement. ORBITS: No acute abnormality. SINUSES: No acute abnormality. SOFT TISSUES AND SKULL: No acute soft tissue abnormality. No skull fracture. IMPRESSION: 1. No acute intracranial abnormality. 2. Stable Ventriculomegaly out of proportion to sulcal enlargement. Electronically signed by: Oneil Devonshire MD 05/12/2024 07:28 PM EST RP Workstation: [id number]   DG Chest 1 View Result Date: 05/12/2024 CLINICAL DATA:  Fall with hip fracture EXAM: CHEST  1 VIEW COMPARISON:  10/23/2020 FINDINGS: Cardiomegaly. No acute airspace disease or effusion. No pneumothorax. Age indeterminate left fifth sixth and seventh lateral rib fractures. IMPRESSION: No active disease. Cardiomegaly. Age indeterminate left  fifth through seventh lateral rib fractures. Electronically Signed   By: Luke Bun M.D.   On: 05/12/2024 18:37   DG Hip Unilat W or Wo Pelvis 2-3 Views Left Result Date: 05/12/2024 CLINICAL DATA:  Fall pain EXAM: DG HIP (WITH OR WITHOUT PELVIS) 2-3V LEFT COMPARISON:  10/19/2023 FINDINGS: Acute mildly displaced left femoral neck fracture. No femoral head dislocation. Pubic symphysis and rami are intact. Moderate right hip degenerative change IMPRESSION: Acute mildly displaced left femoral neck fracture. Electronically Signed   By: Luke Bun M.D.   On: 05/12/2024 18:36    Pertinent labs & imaging results that were available during my care of the patient were reviewed by me and considered in my medical decision making (see MDM for details).  Medications Ordered in ED Medications  sodium chloride  0.9 % bolus 1,000 mL (1,000 mLs Intravenous New Bag/Given 05/12/24 1857)  ondansetron  (ZOFRAN ) injection 4 mg (4 mg Intravenous Given 05/12/24 1857)  morphine (PF) 4 MG/ML injection 4 mg (4 mg Intravenous Given 05/12/24 1857)                                                                                                                                     Procedures Procedures  (including critical care time)  Medical Decision Making / ED Course   MDM:  63 year old presenting to the emergency department with fall at home.  Imaging shows patient has a left hip fracture.  Will need admission for this.  He did also have an unwitnessed fall and takes Plavix , unclear if he hit his head, will obtain CT head and cervical spine.  Patient is a  very poor historian, has history of anoxic brain injury.  No other signs of focal traumatic injury requiring additional imaging.  Clinical Course as of 05/12/24 2037  Thu May 12, 2024  2036 CT head and CT cervical spine without acute process.  Discussed hip fracture with Dr. Margrette, feels patient too complex for [facility] anesthesia, recommends transfer to  [facility].  Discussed with Dr. Teresa with [facility] at [facility], recommends transfer, admission to hospitalist service.  Discussed with Dr. Pearlean who has admitted patient. [WS]    Clinical Course User Index [WS] Francesca Elsie CROME, MD     Additional history obtained: -Additional history obtained from family and ems -External records from outside source obtained and reviewed including: Chart review including previous notes, labs, imaging, consultation notes including prior notes    Lab Tests: -I ordered, reviewed, and interpreted labs.   The pertinent results include:   Labs Reviewed  COMPREHENSIVE METABOLIC PANEL WITH GFR - Abnormal; Notable for the following components:      Result Value   Glucose, Bld 105 (*)    Alkaline Phosphatase 136 (*)    All other components within normal limits  CBC WITH DIFFERENTIAL/PLATELET  PROTIME-INR    Notable for mild hyperglycemia    Imaging Studies ordered: I ordered imaging studies including XR hip On my interpretation imaging demonstrates fracture  I independently visualized and interpreted imaging. I agree with the radiologist interpretation   Medicines ordered and prescription drug management: Meds ordered this encounter  Medications   sodium chloride  0.9 % bolus 1,000 mL   ondansetron  (ZOFRAN ) injection 4 mg   morphine (PF) 4 MG/ML injection 4 mg    -I have reviewed the patients home medicines and have made adjustments as needed   Consultations Obtained: I requested consultation with the orthpedist ,  and discussed lab and imaging findings as well as pertinent plan - they recommend: admission   Cardiac Monitoring: The patient was maintained on a cardiac monitor.  I personally viewed and interpreted the cardiac monitored which showed an underlying rhythm of: NSR   Reevaluation: After the interventions noted above, I reevaluated the patient and found that their symptoms have improved  Co morbidities that complicate the patient  evaluation  Past Medical History:  Diagnosis Date   Anxiety    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   CAD (coronary artery disease)    a. s/p BMS to LAD in 2007 with stent thrombosis and VF arrest and LAD re-stented b. DES to distal LAD in 2010 c. ISR and DES to LAD in 2017 d. DES to LCx in 04/2017 and severe ostial stenosis of small RCA e. 05/2019: DES to proximal LAD.    CHF (congestive heart failure) (HCC)    Depression    Dizziness    Dysphagia, unspecified(787.20)    GERD (gastroesophageal reflux disease)    History of seizure disorder    Hyperlipidemia, mixed    Hypertension    Hypertensive heart disease    unspecified   Ischemic cardiomyopathy    a. 06/2014 Echo: EF 60-65%.   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      Dispostion: Disposition decision including need for hospitalization was considered, and patient admitted to the hospital.    Final Clinical Impression(s) / ED Diagnoses Final diagnoses:  Closed fracture of  left hip, initial encounter [facility])     This chart was dictated using voice recognition software.  Despite best efforts to proofread,  errors can occur which can change the documentation meaning.    Francesca Elsie CROME, MD 05/12/24 2037

## 2024-05-12 NOTE — ED Triage Notes (Signed)
 Pt bib [facility] after a fall at home. Pt tripped on flip flops and hit the concrete on his left hip. EMS reports pt left hip is shortened and rotated out. Pt reports his leg is tingling and is painful. Pt has hx of a TBI and can only remember for about 10 seconds. Pt is unable to recall what happened or any health information

## 2024-05-12 NOTE — H&P (Addendum)
 History and Physical    [name] Cummings FMW:[id number] DOB: 1961/01/24 DOA: 05/12/2024  PCP: System, Provider Not In   Patient coming from: Home  I have personally briefly reviewed patient's old medical records in [facility]  Chief Complaint: Fall  HPI: [name] Cummings is a 63 y.o. male with medical history significant for anoxic brain injury, coronary artery disease, hypertension, ventricular fibrillation, seizures. Patient was brought to the ED after a fall.  History is obtained from chart review and talking to family spouse and daughter at bedside.  Due to patient's anoxic brain injury, he has no short-term memory, and repeats questions multiple times.  Patient was walking outside wearing flip-flops when he fell.  Family thinks he may have tripped on the flip-flops, as it has happened before.  His son and daughter were there but did not witness the fall, but saw him on the floor.  He had otherwise been doing well, he ambulates without assistance or assistive devices.  He has no chest pain, no difficulty breathing, no cough.   ED Course: Temperature 98.9.  Heart rate 65-82.  Respiratory 18-27..  O2 sats initially 90 to 93% dropped to 88 percent about an hour after morphine was given. Pelvic x-ray - acute mildly displaced left femoral neck fracture. Chest x-ray-no active disease, age-indeterminate left 5th through 7th lateral rib fractures. Head and cervical CT negative for acute abnormality, stable ventriculomegaly out of proportion to sulcal enlargement. IV morphine 4 mg x 1. 1 L bolus. EDP consulted Dr. Margrette, due to patient's medical history, patient too complex for any pain anesthesia, recommended transfer to [facility]. EDP talk to Dr. Teresa with [facility], recommends transfer, hold Plavix .  Review of Systems: As per HPI all other systems reviewed and negative.  Past Medical History:  Diagnosis Date   Anxiety    Brain anoxic injury (HCC)    a. 2007 in setting of VF  arrest.   CAD (coronary artery disease)    a. s/p BMS to LAD in 2007 with stent thrombosis and VF arrest and LAD re-stented b. DES to distal LAD in 2010 c. ISR and DES to LAD in 2017 d. DES to LCx in 04/2017 and severe ostial stenosis of small RCA e. 05/2019: DES to proximal LAD.    CHF (congestive heart failure) (HCC)    Depression    Dizziness    Dysphagia, unspecified(787.20)    GERD (gastroesophageal reflux disease)    History of seizure disorder    Hyperlipidemia, mixed    Hypertension    Hypertensive heart disease    unspecified   Ischemic cardiomyopathy    a. 06/2014 Echo: EF 60-65%.   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    Past Surgical History:  Procedure Laterality Date   bedside tracheostomy     with #6 Shiley. 04/14/2006   CARDIAC CATHETERIZATION  09/2005   with bare metal stent to the mid LAD (Minivision 2.5 x 18 mm) with Provisional balloon angioplasty to the second diagonal.   CARDIAC CATHETERIZATION  03/2006   with stenting of the mid LAD with Taxus 2.5 x 20 mm and 2.5 x 8 mm stents.       CARDIAC CATHETERIZATION N/A 07/20/2015   Procedure: Left Heart Cath and Coronary Angiography;  Surgeon: Candyce GORMAN Reek, MD;  Location: [facility] INVASIVE CV LAB;  Service: Cardiovascular;  Laterality: N/A;   CARDIAC CATHETERIZATION N/A 07/20/2015   Procedure: Coronary Stent Intervention;  Surgeon: Candyce GORMAN Reek, MD;  Location: [facility] INVASIVE CV LAB;  Service: Cardiovascular;  Laterality: N/A;   COLONOSCOPY WITH PROPOFOL  N/A 12/05/2022   Procedure: COLONOSCOPY WITH PROPOFOL ;  Surgeon: Rollin Dover, MD;  Location: [facility] ENDOSCOPY;  Service: Gastroenterology;  Laterality: N/A;   CORONARY ANGIOGRAPHY N/A 11/06/2021   Procedure: CORONARY ANGIOGRAPHY;  Surgeon: Wonda Sharper, MD;  Location: [facility] INVASIVE CV LAB;  Service: Cardiovascular;  Laterality: N/A;   CORONARY STENT INTERVENTION  N/A 05/15/2017   Procedure: CORONARY STENT INTERVENTION;  Surgeon: Wonda Sharper, MD;  Location: [facility] INVASIVE CV LAB;  Service: Cardiovascular;  Laterality: N/A;   CORONARY STENT INTERVENTION N/A 05/27/2019   Procedure: CORONARY STENT INTERVENTION;  Surgeon: Wonda Sharper, MD;  Location: [facility] INVASIVE CV LAB;  Service: Cardiovascular;  Laterality: N/A;   CORONARY STENT INTERVENTION N/A 11/06/2021   Procedure: CORONARY STENT INTERVENTION;  Surgeon: Wonda Sharper, MD;  Location: [facility] INVASIVE CV LAB;  Service: Cardiovascular;  Laterality: N/A;   CORONARY STENT PLACEMENT  07/20/2015   LAD with DES   ESOPHAGOGASTRODUODENOSCOPY     KIDNEY STONE SURGERY     LEFT HEART CATH AND CORONARY ANGIOGRAPHY N/A 05/15/2017   Procedure: LEFT HEART CATH AND CORONARY ANGIOGRAPHY;  Surgeon: Wonda Sharper, MD;  Location: [facility] INVASIVE CV LAB;  Service: Cardiovascular;  Laterality: N/A;   LEFT HEART CATH AND CORONARY ANGIOGRAPHY N/A 05/27/2019   Procedure: LEFT HEART CATH AND CORONARY ANGIOGRAPHY;  Surgeon: Wonda Sharper, MD;  Location: [facility] INVASIVE CV LAB;  Service: Cardiovascular;  Laterality: N/A;   LEFT HEART CATH AND CORONARY ANGIOGRAPHY N/A 10/21/2021   Procedure: LEFT HEART CATH AND CORONARY ANGIOGRAPHY;  Surgeon: Mady Bruckner, MD;  Location: [facility] INVASIVE CV LAB;  Service: Cardiovascular;  Laterality: N/A;   PEG TUBE PLACEMENT     PEG TUBE REMOVAL     POLYPECTOMY  12/05/2022   Procedure: POLYPECTOMY;  Surgeon: Rollin Dover, MD;  Location: [facility] ENDOSCOPY;  Service: Gastroenterology;;   stent thrombosis  03/2006   Complicated my sudden cardiac death   TRACHEOSTOMY CLOSURE       reports that he has never smoked. He quit smokeless tobacco use about 18 years ago.  His smokeless tobacco use included chew. He reports that he does not drink alcohol and does not use drugs.  Allergies  Allergen Reactions   Sulfamethoxazole-Trimethoprim Rash and Other (See Comments)    Sweet's syndrome (Sweet's syndrome is an  uncommon skin condition marked by a distinctive eruption of tiny bumps that enlarge and are often tender to the touch.)   Aripiprazole Other (See Comments)   Crestor  [Rosuvastatin  Calcium ] Other (See Comments)    Myalgias   Duloxetine Other (See Comments)   Hydromorphone Hcl Other (See Comments)    Left side went numb   Imdur  [Isosorbide  Nitrate]     Dizziness   Lorazepam Other (See Comments)    Hallucinations    Pseudoephedrine Other (See Comments)    Caused a seizure   Terfenadine Other (See Comments)    Reaction not known   Atorvastatin  Other (See Comments)    Muscle pain   Biaxin [Clarithromycin] Rash    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 Authorizing Provider  aspirin  EC 81 MG tablet Take 81 mg by mouth daily.    [provider]  busPIRone (BUSPAR) 30 MG tablet Take 30 mg by mouth 2 (two) times daily. 01/29/20   [provider]  carvedilol  (COREG ) 25 MG tablet Take 1 tablet (25 mg total) by mouth 2 (two) times daily with a meal. 12/15/23   Branch, Dorn FALCON, MD  clopidogrel  (PLAVIX ) 75 MG tablet Take 1 tablet (75 mg total) by mouth daily. 12/31/23   Strader, Laymon HERO, PA-C  Evolocumab  (REPATHA  SURECLICK) 140 MG/ML SOAJ Inject 140 mg into the skin every 14 (fourteen) days. 05/03/24   Alvan Dorn FALCON, MD  FLUoxetine  (PROZAC ) 20 MG capsule Take 20 mg by mouth 2 (two) times daily. 04/20/14   [provider]  fluticasone  (FLONASE ) 50 MCG/ACT nasal spray Place 1 spray into both nostrils  daily as needed for allergies.  04/20/14   [provider]  HYDROcodone -acetaminophen  (NORCO) 7.5-325 MG tablet Take 1 tablet by mouth 5 (five) times daily. 07/26/20   [provider]  loratadine  (CLARITIN ) 10 MG tablet Take 10 mg by mouth in the morning.    [provider]  nitroGLYCERIN  (NITROSTAT ) 0.4 MG SL tablet Place 1 tablet (0.4 mg total) under the tongue every 5 (five) minutes as needed for chest pain. 12/31/23   Strader, Laymon HERO, PA-C  ondansetron  (ZOFRAN -ODT) 4 MG disintegrating tablet Take 4 mg by mouth every 8 (eight) hours as needed. 01/31/23   [provider]  oxybutynin (DITROPAN) 5 MG tablet Take 5 mg by mouth in the morning and at bedtime.    [provider]  pantoprazole  (PROTONIX ) 40 MG tablet Take 1 tablet (40 mg total) daily by mouth. 05/15/17   Bhagat, Bhavinkumar, PA  pravastatin  (PRAVACHOL ) 80 MG tablet Take 80 mg by mouth every evening.    [provider]  REXULTI 1 MG TABS tablet Take 1 mg by mouth daily. 12/17/23   [provider]  risperiDONE (RISPERDAL) 0.5 MG tablet Take 0.5 mg by mouth at bedtime. 08/05/23   [provider]  VENTOLIN HFA 108 (90 Base) MCG/ACT inhaler Inhale 1-2 puffs into the lungs every 6 (six) hours as needed for wheezing or shortness of breath.  07/25/18   [provider]  Vitamin D, Ergocalciferol, (DRISDOL) 1.25 MG (50000 UT) CAPS capsule Take 50,000 Units by mouth every Tuesday.    [provider]    Physical Exam: Vitals:   05/12/24 1915 05/12/24 1930 05/12/24 1945 05/12/24 1946  BP: 139/63 (!) 145/71 (!) 143/73   Pulse: 67 73 76 73  Resp: 19 (!) 23 (!) 27 (!) 21  SpO2: 90% 92%  90%  Weight:      Height:        Constitutional: NAD, calm, comfortable Vitals:   05/12/24 1915 05/12/24 1930 05/12/24 1945 05/12/24 1946  BP: 139/63 (!) 145/71 (!) 143/73   Pulse: 67 73 76 73  Resp: 19 (!) 23 (!) 27 (!) 21  SpO2: 90% 92%  90%  Weight:      Height:        Eyes: PERRL, lids and conjunctivae normal  ENMT: Mucous membranes are moist.   Neck: normal, supple, no masses, no thyromegaly Respiratory: clear to auscultation bilaterally, no wheezing, no crackles. Normal respiratory effort. No accessory muscle use.  Cardiovascular: Regular rate and rhythm, no murmurs / rubs / gallops. No extremity edema.  Abdomen: no tenderness, no masses palpated. No hepatosplenomegaly. Bowel sounds positive.  Musculoskeletal: no clubbing / cyanosis. No joint deformity upper and lower extremities.  Skin: no rashes, lesions, ulcers. No induration Neurologic: Repeats questions several times, no facial asymmetry, moving extremity spontaneously except for left lower extremity. Psychiatric: Normal judgment and insight. Alert and oriented x 3. Normal mood.   Labs on Admission: I have personally reviewed following labs and imaging studies  CBC: Recent Labs  Lab 05/12/24 1800  WBC 7.9  NEUTROABS 5.3  HGB 14.0  HCT 41.2  MCV 86.7  PLT 240   Basic Metabolic Panel: Recent Labs  Lab 05/12/24 1800  NA 139  K 4.4  CL 103  CO2 26  GLUCOSE 105*  BUN 19  CREATININE 1.03  CALCIUM  9.2   GFR: Estimated Creatinine Clearance: 95.5 mL/min (by C-G formula based on SCr of 1.03 mg/dL). Liver Function Tests: Recent Labs  Lab 05/12/24 1800  AST 24  ALT 19  ALKPHOS 136*  BILITOT 0.6  PROT 6.8  ALBUMIN 4.5   Coagulation Profile: Recent Labs  Lab 05/12/24 1800  INR 1.0   Radiological Exams on Admission: CT Cervical Spine Wo Contrast Result Date: 05/12/2024 EXAM: CT CERVICAL SPINE WITHOUT CONTRAST 05/12/2024 07:24:00 PM TECHNIQUE: CT of the cervical spine was performed without the administration of intravenous contrast. Multiplanar reformatted images are provided for review. Automated exposure control, iterative reconstruction, and/or weight based adjustment of the mA/kV was utilized to reduce the radiation dose to as low as reasonably achievable. COMPARISON:  10/23/2020 CLINICAL HISTORY: Recent fall with neck pain. FINDINGS: CERVICAL SPINE: BONES AND ALIGNMENT: Loss of the normal cervical lordosis is noted. The odontoid is within normal limits. No acute fracture or acute facet abnormality is seen. DEGENERATIVE CHANGES: Multilevel osteophytic changes are seen. Multilevel facet hypertrophic changes are noted as well. SOFT TISSUES: Surrounding soft tissue structures are unremarkable. Visualized lung apices are within normal limits. IMPRESSION: 1. No acute fracture or acute facet abnormality. 2. Loss of the normal cervical lordosis. 3. Multilevel osteophytic and facet hypertrophic changes. Electronically signed by: Oneil Devonshire MD 05/12/2024 07:30 PM EST RP Workstation: [id number]   CT Head Wo Contrast Result Date: 05/12/2024 EXAM: CT HEAD WITHOUT CONTRAST 05/12/2024 07:24:00 PM TECHNIQUE: CT of the head was performed without the administration of intravenous contrast. Automated exposure control, iterative reconstruction, and/or weight based adjustment of the mA/kV was utilized to reduce the radiation dose to as low as reasonably achievable. COMPARISON: 4 x 26 x 22 CLINICAL HISTORY: Head trauma, abnormal mental status (Age 47-64y). FINDINGS: BRAIN AND VENTRICLES: No acute hemorrhage. No evidence of acute infarct. Cerebral atrophy. Ventriculomegaly out of proportion to sulcal enlargement. ORBITS: No acute abnormality. SINUSES: No acute abnormality. SOFT TISSUES AND SKULL: No acute soft tissue abnormality. No skull fracture. IMPRESSION: 1. No acute intracranial abnormality. 2. Stable Ventriculomegaly out of proportion to sulcal enlargement. Electronically signed by: Oneil Devonshire MD 05/12/2024 07:28 PM EST RP Workstation: [id number]   DG Chest 1 View Result Date: 05/12/2024 CLINICAL DATA:  Fall with hip fracture EXAM: CHEST  1 VIEW COMPARISON:  10/23/2020 FINDINGS: Cardiomegaly. No acute airspace disease or effusion. No pneumothorax. Age indeterminate left fifth sixth and  seventh lateral rib fractures. IMPRESSION: No active disease.  Cardiomegaly. Age indeterminate left fifth through seventh lateral rib fractures. Electronically Signed   By: Luke Bun M.D.   On: 05/12/2024 18:37   DG Hip Unilat W or Wo Pelvis 2-3 Views Left Result Date: 05/12/2024 CLINICAL DATA:  Fall pain EXAM: DG HIP (WITH OR WITHOUT PELVIS) 2-3V LEFT COMPARISON:  10/19/2023 FINDINGS: Acute mildly displaced left femoral neck fracture. No femoral head dislocation. Pubic symphysis and rami are intact. Moderate right hip degenerative change IMPRESSION: Acute mildly displaced left femoral neck fracture. Electronically Signed   By: Luke Bun M.D.   On: 05/12/2024 18:36   EKG: Independently reviewed.  Sinus rhythm, rate 87, QTc 442.  Diffuse nonspecific T wave abnormalities.  Assessment/Plan Principal Problem:   Closed left hip fracture (HCC) Active Problems:   Essential hypertension   Ischemic cardiomyopathy   History of ventricular fibrillation   CAD (coronary artery disease)   Brain anoxic injury (HCC)   Assessment and Plan:  Closed left hip fracture-status post fall.  Unknown reason for fall.  Hip x-ray- Acute mildly displaced left femoral neck fracture.  - EDP talked to Dr. Arison, recommended transfer to [facility], patient too complex for any pain anesthesia. - EDP talked to [facility] at [facility], recommended transfer, under hospitalist service, hold Plavix . - IV morphine 4 mg Q4 hourly as needed - N.p.o. midnight  Acute hypoxic respiratory failure-likely narcotic induced.  O2 sats dropped to 88% on room air about an hour after morphine.  He has no cardiorespiratory symptoms at this time and chest x-ray is clear.  Coronary artery disease history-no chest pain at this time.  Follows with cardiology Dr. Alvan.  History of multiple stents placed between 2007-2023.  With initial bare-metal stents to LAD 2007.  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  LAD was restented  at that time, he has had several subsequent stents since then.  Most recent 2023-patient had PCI as opposed to CABG given his cognitive status and after discussions with family. - Consult cardiology for cardiac clearance in the morning - Hold Plavix  for now - Resume aspirin , pravastatin   Anoxic brain injury/short-term memory deficits- 2/2 heart attack in 2007, ventricular fibrillation.  Repeats questions several times-per family every 10 seconds, but recognizes family. - Med reconciliation pending  Hypertension-stable. - Resume carvedilol   Ischemic cardiomyopathy-volume status stable.  Last echo 2023 EF 55 to 60%.  No on diuretics.  DVT prophylaxis: Scds Code Status: Full Family Communication: Spouse and daughter at bedside Disposition Plan: > 2 days Consults called: Ortho Admission status: inpt tele I certify that at the point of admission it is my clinical judgment that the patient will require inpatient hospital care spanning beyond 2 midnights from the point of admission due to high intensity of service, high risk for further deterioration and high frequency of surveillance required.    Tully FORBES Carwin, MD 05/12/2024 9:54 PM  For on call review [email or web address].

## 2024-05-12 NOTE — Progress Notes (Signed)
 Case discussed with emergency department provider.  63 year old male with history of ischemic cardiomyopathy s/p multiple stent placement and amnesia after an anoxic brain injury now s/p unwitnessed fall with a left femoral neck fracture.  Per ED physician, anesthesia at [facility] does not feel that the hospital has adequate coverage given his cardiac history.  Plan for transfer to the hospitalist service at [facility] for planned left hip arthroplasty tomorrow pending medical clearance.  NPO at midnight. Anticoagulants should be held after midnight.  Rankin Pizza, MD

## 2024-05-13 ENCOUNTER — Encounter (HOSPITAL_COMMUNITY): Payer: Self-pay | Admitting: Internal Medicine

## 2024-05-13 ENCOUNTER — Encounter (HOSPITAL_COMMUNITY)
Admission: EM | Disposition: A | Discharge status: Home / Self Care | Payer: Self-pay | Source: Home / Self Care | Attending: Internal Medicine

## 2024-05-13 ENCOUNTER — Inpatient Hospital Stay (HOSPITAL_COMMUNITY)

## 2024-05-13 ENCOUNTER — Inpatient Hospital Stay (HOSPITAL_COMMUNITY): Admitting: Anesthesiology

## 2024-05-13 DIAGNOSIS — I110 Hypertensive heart disease with heart failure: Secondary | ICD-10-CM | POA: Diagnosis not present

## 2024-05-13 DIAGNOSIS — I25118 Atherosclerotic heart disease of native coronary artery with other forms of angina pectoris: Secondary | ICD-10-CM

## 2024-05-13 DIAGNOSIS — Z01810 Encounter for preprocedural cardiovascular examination: Secondary | ICD-10-CM

## 2024-05-13 DIAGNOSIS — S72002A Fracture of unspecified part of neck of left femur, initial encounter for closed fracture: Secondary | ICD-10-CM | POA: Diagnosis not present

## 2024-05-13 DIAGNOSIS — I509 Heart failure, unspecified: Secondary | ICD-10-CM

## 2024-05-13 DIAGNOSIS — I255 Ischemic cardiomyopathy: Secondary | ICD-10-CM | POA: Diagnosis not present

## 2024-05-13 DIAGNOSIS — Z8679 Personal history of other diseases of the circulatory system: Secondary | ICD-10-CM

## 2024-05-13 HISTORY — PX: TOTAL HIP ARTHROPLASTY: SHX124

## 2024-05-13 LAB — SURGICAL PCR SCREEN
MRSA, PCR: NEGATIVE
Staphylococcus aureus: NEGATIVE

## 2024-05-13 LAB — TYPE AND SCREEN
ABO/RH(D): O POS
Antibody Screen: NEGATIVE

## 2024-05-13 LAB — HIV ANTIBODY (ROUTINE TESTING W REFLEX): HIV Screen 4th Generation wRfx: NONREACTIVE

## 2024-05-13 LAB — ABO/RH: ABO/RH(D): O POS

## 2024-05-13 SURGERY — ARTHROPLASTY, HIP, TOTAL, ANTERIOR APPROACH
Anesthesia: General | Site: Hip | Laterality: Left

## 2024-05-13 MED ORDER — POLYETHYLENE GLYCOL 3350 17 G PO PACK: 17.0000 g | PACK | Freq: Every day | ORAL | Status: DC | PRN

## 2024-05-13 MED ORDER — ASPIRIN 81 MG PO TBEC
81.0000 mg | DELAYED_RELEASE_TABLET | Freq: Every day | ORAL | Status: DC
  Administered 2024-05-13: 81 mg via ORAL
  Filled 2024-05-13: qty 1

## 2024-05-13 MED ORDER — EPHEDRINE SULFATE-NACL 50-0.9 MG/10ML-% IV SOSY
PREFILLED_SYRINGE | INTRAVENOUS | Status: DC | PRN
Start: 2024-05-13 — End: 2024-05-13
  Administered 2024-05-13: 15 mg via INTRAVENOUS

## 2024-05-13 MED ORDER — FENTANYL CITRATE (PF) 100 MCG/2ML IJ SOLN: 25.0000 ug | INTRAMUSCULAR | Status: DC | PRN

## 2024-05-13 MED ORDER — CARVEDILOL 25 MG PO TABS
25.0000 mg | ORAL_TABLET | Freq: Two times a day (BID) | ORAL | Status: DC
  Administered 2024-05-13 – 2024-05-18 (×10): 25 mg via ORAL
  Filled 2024-05-13 (×8): qty 1
  Filled 2024-05-13: qty 2
  Filled 2024-05-13: qty 1

## 2024-05-13 MED ORDER — BREXPIPRAZOLE 1 MG PO TABS
1.0000 mg | ORAL_TABLET | Freq: Every day | ORAL | Status: DC
  Administered 2024-05-13 – 2024-05-17 (×5): 1 mg via ORAL
  Filled 2024-05-13 (×5): qty 1

## 2024-05-13 MED ORDER — ORAL CARE MOUTH RINSE: 15.0000 mL | Freq: Once | OROMUCOSAL | Status: DC

## 2024-05-13 MED ORDER — MIDAZOLAM HCL 2 MG/2ML IJ SOLN
INTRAMUSCULAR | Status: AC
  Filled 2024-05-13: qty 2

## 2024-05-13 MED ORDER — FLUOXETINE HCL 20 MG PO CAPS
20.0000 mg | ORAL_CAPSULE | Freq: Three times a day (TID) | ORAL | Status: DC
  Administered 2024-05-13 – 2024-05-18 (×15): 20 mg via ORAL
  Filled 2024-05-13 (×15): qty 1

## 2024-05-13 MED ORDER — LORATADINE 10 MG PO TABS
10.0000 mg | ORAL_TABLET | Freq: Every morning | ORAL | Status: DC
  Administered 2024-05-13 – 2024-05-18 (×6): 10 mg via ORAL
  Filled 2024-05-13 (×6): qty 1

## 2024-05-13 MED ORDER — PHENYLEPHRINE HCL-NACL 20-0.9 MG/250ML-% IV SOLN
INTRAVENOUS | Status: DC | PRN
  Administered 2024-05-13: 25 ug/min via INTRAVENOUS

## 2024-05-13 MED ORDER — ONDANSETRON HCL 4 MG/2ML IJ SOLN
INTRAMUSCULAR | Status: DC | PRN
  Administered 2024-05-13: 4 mg via INTRAVENOUS

## 2024-05-13 MED ORDER — PRONTOSAN WOUND IRRIGATION OPTIME
TOPICAL | Status: DC | PRN
  Administered 2024-05-13: 1 via TOPICAL

## 2024-05-13 MED ORDER — CHLORHEXIDINE GLUCONATE 4 % EX SOLN: 60.0000 mL | Freq: Once | CUTANEOUS | Status: DC

## 2024-05-13 MED ORDER — SODIUM CHLORIDE (PF) 0.9 % IJ SOLN
INTRAMUSCULAR | Status: DC | PRN
  Administered 2024-05-13: 61 mL via INTRAMUSCULAR

## 2024-05-13 MED ORDER — BUPIVACAINE-EPINEPHRINE (PF) 0.5% -1:200000 IJ SOLN
INTRAMUSCULAR | Status: AC
Start: 2024-05-13 — End: 2024-05-13
  Filled 2024-05-13: qty 30

## 2024-05-13 MED ORDER — ENSURE PLUS HIGH PROTEIN PO LIQD
237.0000 mL | Freq: Two times a day (BID) | ORAL | Status: DC
  Administered 2024-05-14 – 2024-05-18 (×9): 237 mL via ORAL

## 2024-05-13 MED ORDER — ALBUMIN HUMAN 5 % IV SOLN: INTRAVENOUS | Status: DC | PRN

## 2024-05-13 MED ORDER — FLUTICASONE PROPIONATE 50 MCG/ACT NA SUSP: 1.0000 | Freq: Every day | NASAL | Status: DC | PRN

## 2024-05-13 MED ORDER — SODIUM CHLORIDE 0.9 % IR SOLN
Status: DC | PRN
  Administered 2024-05-13: 1000 mL
  Administered 2024-05-13: 3000 mL

## 2024-05-13 MED ORDER — VANCOMYCIN HCL 1000 MG IV SOLR
INTRAVENOUS | Status: AC
  Filled 2024-05-13: qty 20

## 2024-05-13 MED ORDER — CHLORHEXIDINE GLUCONATE 0.12 % MT SOLN: 15.0000 mL | Freq: Once | OROMUCOSAL | Status: DC

## 2024-05-13 MED ORDER — FENTANYL CITRATE (PF) 250 MCG/5ML IJ SOLN
INTRAMUSCULAR | Status: AC
  Filled 2024-05-13: qty 5

## 2024-05-13 MED ORDER — TRANEXAMIC ACID-NACL 1000-0.7 MG/100ML-% IV SOLN
1000.0000 mg | INTRAVENOUS | Status: AC
  Administered 2024-05-13: 1000 mg via INTRAVENOUS
  Filled 2024-05-13: qty 100

## 2024-05-13 MED ORDER — KETOROLAC TROMETHAMINE 30 MG/ML IJ SOLN
INTRAMUSCULAR | Status: AC
Start: 2024-05-13 — End: 2024-05-13
  Filled 2024-05-13: qty 1

## 2024-05-13 MED ORDER — FENTANYL CITRATE (PF) 250 MCG/5ML IJ SOLN
INTRAMUSCULAR | Status: DC | PRN
  Administered 2024-05-13 (×4): 50 ug via INTRAVENOUS

## 2024-05-13 MED ORDER — PANTOPRAZOLE SODIUM 40 MG PO TBEC
40.0000 mg | DELAYED_RELEASE_TABLET | Freq: Every evening | ORAL | Status: DC
  Administered 2024-05-14 – 2024-05-17 (×4): 40 mg via ORAL
  Filled 2024-05-13 (×5): qty 1

## 2024-05-13 MED ORDER — ALBUTEROL SULFATE (2.5 MG/3ML) 0.083% IN NEBU: 2.5000 mg | INHALATION_SOLUTION | RESPIRATORY_TRACT | Status: DC | PRN

## 2024-05-13 MED ORDER — PROPOFOL 10 MG/ML IV BOLUS
INTRAVENOUS | Status: DC | PRN
Start: 2024-05-13 — End: 2024-05-13
  Administered 2024-05-13: 50 mg via INTRAVENOUS
  Administered 2024-05-13: 80 mg via INTRAVENOUS

## 2024-05-13 MED ORDER — ONDANSETRON HCL 4 MG/2ML IJ SOLN: 4.0000 mg | Freq: Once | INTRAMUSCULAR | Status: DC | PRN

## 2024-05-13 MED ORDER — ONDANSETRON HCL 4 MG/2ML IJ SOLN
INTRAMUSCULAR | Status: AC
  Filled 2024-05-13: qty 2

## 2024-05-13 MED ORDER — ROCURONIUM BROMIDE 10 MG/ML (PF) SYRINGE
PREFILLED_SYRINGE | INTRAVENOUS | Status: DC | PRN
  Administered 2024-05-13: 40 mg via INTRAVENOUS
  Administered 2024-05-13: 60 mg via INTRAVENOUS

## 2024-05-13 MED ORDER — DEXAMETHASONE SOD PHOSPHATE PF 10 MG/ML IJ SOLN
INTRAMUSCULAR | Status: DC | PRN
Start: 2024-05-13 — End: 2024-05-13
  Administered 2024-05-13: 10 mg via INTRAVENOUS

## 2024-05-13 MED ORDER — VANCOMYCIN HCL 1000 MG IV SOLR
INTRAVENOUS | Status: DC | PRN
  Administered 2024-05-13: 1000 mg via TOPICAL

## 2024-05-13 MED ORDER — NITROGLYCERIN 0.4 MG SL SUBL: 0.4000 mg | SUBLINGUAL_TABLET | SUBLINGUAL | Status: DC | PRN

## 2024-05-13 MED ORDER — CEFAZOLIN SODIUM-DEXTROSE 2-4 GM/100ML-% IV SOLN
2.0000 g | INTRAVENOUS | Status: AC
  Administered 2024-05-13: 2 g via INTRAVENOUS
  Filled 2024-05-13: qty 100

## 2024-05-13 MED ORDER — LACTATED RINGERS IV SOLN: INTRAVENOUS | Status: DC

## 2024-05-13 MED ORDER — LIDOCAINE 2% (20 MG/ML) 5 ML SYRINGE
INTRAMUSCULAR | Status: DC | PRN
  Administered 2024-05-13: 100 mg via INTRAVENOUS

## 2024-05-13 MED ORDER — OXYBUTYNIN CHLORIDE ER 10 MG PO TB24
10.0000 mg | ORAL_TABLET | Freq: Two times a day (BID) | ORAL | Status: DC
  Administered 2024-05-13 – 2024-05-18 (×9): 10 mg via ORAL
  Filled 2024-05-13 (×10): qty 1

## 2024-05-13 MED ORDER — PHENYLEPHRINE 80 MCG/ML (10ML) SYRINGE FOR IV PUSH (FOR BLOOD PRESSURE SUPPORT)
PREFILLED_SYRINGE | INTRAVENOUS | Status: DC | PRN
  Administered 2024-05-13: 120 ug via INTRAVENOUS
  Administered 2024-05-13: 240 ug via INTRAVENOUS

## 2024-05-13 MED ORDER — POVIDONE-IODINE 10 % EX SWAB
2.0000 | Freq: Once | CUTANEOUS | Status: AC
  Administered 2024-05-13: 2 via TOPICAL

## 2024-05-13 MED ORDER — PROPOFOL 500 MG/50ML IV EMUL
INTRAVENOUS | Status: DC | PRN
  Administered 2024-05-13: 100 ug/kg/min via INTRAVENOUS

## 2024-05-13 MED ORDER — PRAVASTATIN SODIUM 40 MG PO TABS
80.0000 mg | ORAL_TABLET | Freq: Every evening | ORAL | Status: DC
  Administered 2024-05-14 – 2024-05-17 (×4): 80 mg via ORAL
  Filled 2024-05-13 (×4): qty 2

## 2024-05-13 MED ORDER — BUSPIRONE HCL 5 MG PO TABS
15.0000 mg | ORAL_TABLET | Freq: Two times a day (BID) | ORAL | Status: DC
  Administered 2024-05-13 – 2024-05-18 (×10): 15 mg via ORAL
  Filled 2024-05-13 (×10): qty 1

## 2024-05-13 MED ORDER — 0.9 % SODIUM CHLORIDE (POUR BTL) OPTIME
TOPICAL | Status: DC | PRN
  Administered 2024-05-13: 1000 mL

## 2024-05-13 MED ORDER — ONDANSETRON HCL 4 MG/2ML IJ SOLN: 4.0000 mg | Freq: Three times a day (TID) | INTRAMUSCULAR | Status: DC | PRN

## 2024-05-13 MED ORDER — SUGAMMADEX SODIUM 200 MG/2ML IV SOLN
INTRAVENOUS | Status: DC | PRN
  Administered 2024-05-13: 215 mg via INTRAVENOUS

## 2024-05-13 SURGICAL SUPPLY — 51 items
ALCOHOL 70% 16 OZ (MISCELLANEOUS) ×1 IMPLANT
BAG COUNTER SPONGE SURGICOUNT (BAG) ×1 IMPLANT
BLADE CLIPPER SURG (BLADE) IMPLANT
BLADE SAG 18X100X1.27 (BLADE) IMPLANT
CHLORAPREP W/TINT 26 (MISCELLANEOUS) ×1 IMPLANT
COVER SURGICAL LIGHT HANDLE (MISCELLANEOUS) ×1 IMPLANT
DERMABOND ADVANCED .7 DNX12 (GAUZE/BANDAGES/DRESSINGS) ×2 IMPLANT
DRAPE 3/4 80X56 (DRAPES) ×3 IMPLANT
DRAPE C-ARM 42X72 X-RAY (DRAPES) ×1 IMPLANT
DRAPE STERI IOBAN 125X83 (DRAPES) ×1 IMPLANT
DRAPE U-SHAPE 47X51 STRL (DRAPES) ×3 IMPLANT
DRSG AQUACEL AG ADV 3.5X10 (GAUZE/BANDAGES/DRESSINGS) ×1 IMPLANT
ELECT PENCIL ROCKER SW 15FT (MISCELLANEOUS) ×1 IMPLANT
ELECTRODE BLDE 4.0 EZ CLN MEGD (MISCELLANEOUS) ×1 IMPLANT
ELECTRODE REM PT RTRN 9FT ADLT (ELECTROSURGICAL) ×1 IMPLANT
EVACUATOR 1/8 PVC DRAIN (DRAIN) IMPLANT
GLOVE BIO SURGEON STRL SZ7.5 (GLOVE) ×1 IMPLANT
GLOVE BIO SURGEON STRL SZ8.5 (GLOVE) ×2 IMPLANT
GLOVE BIOGEL PI IND STRL 7.5 (GLOVE) ×1 IMPLANT
GLOVE BIOGEL PI IND STRL 8.5 (GLOVE) ×1 IMPLANT
GOWN STRL REUS W/ TWL XL LVL3 (GOWN DISPOSABLE) ×1 IMPLANT
GOWN STRL REUS W/TWL 2XL LVL3 (GOWN DISPOSABLE) ×1 IMPLANT
HEAD CERAMIC BIOLOX 36 T1 STD (Head) IMPLANT
HOOD PEEL AWAY T7 (MISCELLANEOUS) ×2 IMPLANT
KIT BASIN OR (CUSTOM PROCEDURE TRAY) ×1 IMPLANT
KIT TURNOVER KIT B (KITS) ×1 IMPLANT
LINER NCONST LONG G 36 G7 +5 (Liner) IMPLANT
MANIFOLD NEPTUNE II (INSTRUMENTS) ×1 IMPLANT
MARKER SKIN DUAL TIP RULER LAB (MISCELLANEOUS) ×2 IMPLANT
NDL SPNL 18GX3.5 QUINCKE PK (NEEDLE) ×1 IMPLANT
NEEDLE SPNL 18GX3.5 QUINCKE PK (NEEDLE) ×1 IMPLANT
PACK ANTERIOR HIP CUSTOM (KITS) ×1 IMPLANT
PAD ARMBOARD POSITIONER FOAM (MISCELLANEOUS) ×2 IMPLANT
SAW OSC TIP CART 19.5X105X1.3 (SAW) ×1 IMPLANT
SEALER BIPOLAR AQUA 6.0 (INSTRUMENTS) IMPLANT
SET HNDPC FAN SPRY TIP SCT (DISPOSABLE) ×1 IMPLANT
SHELL ACET G7 4H 60 SZG (Shell) IMPLANT
SOLN 0.9% NACL POUR BTL 1000ML (IV SOLUTION) ×1 IMPLANT
SOLN STERILE WATER BTL 1000 ML (IV SOLUTION) ×3 IMPLANT
SOLUTION PRONTOSAN WOUND 350ML (IRRIGATION / IRRIGATOR) ×1 IMPLANT
STAPLER SKIN PROX 35W (STAPLE) IMPLANT
STEM FEM CMTLS HIP 22X164 133D (Stem) IMPLANT
SUT MNCRL AB 3-0 PS2 18 (SUTURE) IMPLANT
SUT MON AB 2-0 CT1 36 (SUTURE) ×1 IMPLANT
SUT VIC AB 2-0 CT1 TAPERPNT 27 (SUTURE) ×1 IMPLANT
SUTURE STRATFX 0 PDS 27 VIOLET (SUTURE) ×1 IMPLANT
SYR 50ML LL SCALE MARK (SYRINGE) ×1 IMPLANT
TOWEL GREEN STERILE (TOWEL DISPOSABLE) ×1 IMPLANT
TOWEL GREEN STERILE FF (TOWEL DISPOSABLE) ×1 IMPLANT
TRAY FOLEY W/BAG SLVR 16FR ST (SET/KITS/TRAYS/PACK) IMPLANT
TUBE SUCTION HIGH CAP CLEAR NV (SUCTIONS) ×1 IMPLANT

## 2024-05-13 NOTE — Anesthesia Preprocedure Evaluation (Signed)
 Anesthesia Evaluation  Patient identified by MRN, date of birth, ID band Patient confused    Reviewed: Allergy & Precautions, NPO status , Patient's Chart, lab work & pertinent test results, reviewed documented beta blocker date and time   Airway Mallampati: II  TM Distance: >3 FB Neck ROM: Full    Dental  (+) Teeth Intact, Dental Advisory Given   Pulmonary neg pulmonary ROS   Pulmonary exam normal breath sounds clear to auscultation       Cardiovascular hypertension, Pt. on home beta blockers and Pt. on medications + angina  + CAD, + Past MI, + Cardiac Stents (DES to dist LAD in 2010, DES to the LAD in 2017 for ISR, DES to LCX in 2018, DES to LAD in 2020, DES to distal left main and ostial LAD in 10/2021) and +CHF  Normal cardiovascular exam+ dysrhythmias Ventricular Fibrillation  Rhythm:Regular Rate:Normal  Most recent echocardiogram from 09/2021 showed EF 55-60% with regional wall motion abnormalities. There was sluggish blood flow in the apex with no obvious LV apical thrombus.  RV systolic function and size were normal.  No significant valvular abnormalities.   Neuro/Psych Seizures -,  PSYCHIATRIC DISORDERS Anxiety Depression    Anoxic brain injury 2007 s/p VFib arrest    GI/Hepatic Neg liver ROS,GERD  Medicated,,  Endo/Other  Obesity   Renal/GU negative Renal ROS     Musculoskeletal Left femoral neck fracture   Abdominal   Peds  Hematology  (+) Blood dyscrasia (Plavix )   Anesthesia Other Findings Day of surgery medications reviewed with the patient.  Reproductive/Obstetrics                              Anesthesia Physical Anesthesia Plan  ASA: 4 and emergent  Anesthesia Plan: General   Post-op Pain Management: Ofirmev  IV (intra-op)*   Induction: Intravenous  PONV Risk Score and Plan: 2 and TIVA, Dexamethasone  and Ondansetron   Airway Management Planned: Oral ETT  Additional  Equipment: ClearSight  Intra-op Plan:   Post-operative Plan: Extubation in OR  Informed Consent: I have reviewed the patients History and Physical, chart, labs and discussed the procedure including the risks, benefits and alternatives for the proposed anesthesia with the patient or authorized representative who has indicated his/her understanding and acceptance.     Dental advisory given and Consent reviewed with POA  Plan Discussed with: CRNA  Anesthesia Plan Comments:          Anesthesia Quick Evaluation

## 2024-05-13 NOTE — Op Note (Signed)
 OPERATIVE REPORT  SURGEON: Redell Shoals, MD   ASSISTANT: Staff.  PREOPERATIVE DIAGNOSIS: Displaced Left femoral neck fracture.   POSTOPERATIVE DIAGNOSIS: Displaced Left femoral neck fracture.   PROCEDURE: Left total hip arthroplasty, anterior approach.   IMPLANTS: Biomet Taperloc Reduced Distal stem, size 22 x 164 mm, offset. Biomet G7 OsseoTi Cup, size 60 mm. Biomet Vivacit-E liner, size 36 mm, G, +5 mm neutral. Biomet Biolox ceramic head ball, size 36 + 0 mm.  ANESTHESIA:  General  ANTIBIOTICS: 2g ancef.  ESTIMATED BLOOD LOSS:-50 mL    DRAINS: None.  COMPLICATIONS: None   CONDITION: PACU - hemodynamically stable.   BRIEF CLINICAL NOTE: [name] Cummings is a 63 y.o. male with a displaced Left femoral neck fracture. The patient was admitted to the hospitalist service and underwent perioperative risk stratification and medical optimization. The risks, benefits, and alternatives to total hip arthroplasty were explained, and the patient elected to proceed.  PROCEDURE IN DETAIL: The patient was taken to the operating room and general anesthesia was induced on the hospital bed.  The patient was then positioned on the Hana table.  All bony prominences were well padded.  The hip was prepped and draped in the normal sterile surgical fashion.  A time-out was called verifying side and site of surgery. Antibiotics were given within 60 minutes of beginning the procedure.   Bikini incision was made, and the direct anterior approach to the hip was performed through the Hueter interval.  Lateral femoral circumflex vessels were treated with the Auqumantys. The anterior capsule was exposed and an inverted T capsulotomy was made.  Fracture hematoma was encountered and evacuated. The patient was found to have a comminuted Left subcapital femoral neck fracture.  I freshened the femoral neck cut with a saw.  I removed the femoral neck fragment.  A corkscrew was placed into the head and the head was  removed.  This was passed to the back table and was measured. The pubofemoral ligament was released subperiosteally to the lesser trochanter.  Acetabular exposure was achieved, and the pulvinar and labrum were excised. Sequential reaming of the acetabulum was then performed up to a size 59 mm reamer under direct visulization. A 60 mm cup was then opened and impacted into place at approximately 40 degrees of abduction and 20 degrees of anteversion. The final polyethylene liner was impacted into place and acetabular osteophytes were removed.    I then gained femoral exposure taking care to protect the abductors and greater trochanter.  This was performed using standard external rotation, extension, and adduction.  A cookie cutter was used to enter the femoral canal, and then the femoral canal finder was placed.  Sequential broaching was performed up to a size 22.  Calcar planer was used on the femoral neck remnant.  I placed a high offset neck and a trial head ball.  The hip was reduced.  Leg lengths and offset were checked fluoroscopically.  The hip was dislocated and trial components were removed.  The final implants were placed, and the hip was reduced.  Fluoroscopy was used to confirm component position and leg lengths.  At 90 degrees of external rotation and full extension, the hip was stable to an anterior directed force.   The wound was copiously irrigated with Prontosan solution and normal saline using pulse lavage.  Marcaine solution was injected into the periarticular soft tissue.  The wound was closed in layers using #1 Stratafix for the fascia, 2-0 Vicryl for the subcutaneous fat, 2-0 Monocryl  for the deep dermal layer, and staples + Dermabond for the skin.  Once the glue was fully dried, an Aquacell Ag dressing was applied.  The patient was transported to the recovery room in stable condition.  Sponge, needle, and instrument counts were correct at the end of the case x2.  The patient tolerated the  procedure well and there were no known complications.

## 2024-05-13 NOTE — Transfer of Care (Signed)
 Immediate Anesthesia Transfer of Care Note  Patient: [name] Cummings  Procedure(s) Performed: ARTHROPLASTY, HIP, TOTAL, ANTERIOR APPROACH (Left: Hip)  Patient Location: PACU  Anesthesia Type:General  Level of Consciousness: sedated  Airway & Oxygen Therapy: Patient Spontanous Breathing and Patient connected to face mask oxygen  Post-op Assessment: Report given to RN and Post -op Vital signs reviewed and stable  Post vital signs: Reviewed and stable  Last Vitals:  Vitals Value Taken Time  BP 110/96 05/13/24 19:57  Temp 98   Pulse 85 05/13/24 19:59  Resp 19 05/13/24 19:59  SpO2 90 % 05/13/24 19:59  Vitals shown include unfiled device data.  Last Pain:  Vitals:   05/13/24 1726  TempSrc: Oral  PainSc:       Patients Stated Pain Goal: 0 (05/13/24 1726)  Complications: No notable events documented.

## 2024-05-13 NOTE — Anesthesia Postprocedure Evaluation (Signed)
 Anesthesia Post Note  Patient: Fuquan Wilson Machnik  Procedure(s) Performed: ARTHROPLASTY, HIP, TOTAL, ANTERIOR APPROACH (Left: Hip)     Patient location during evaluation: PACU Anesthesia Type: General Level of consciousness: awake and alert Pain management: pain level controlled Vital Signs Assessment: post-procedure vital signs reviewed and stable Respiratory status: spontaneous breathing, nonlabored ventilation, respiratory function stable and patient connected to nasal cannula oxygen Cardiovascular status: blood pressure returned to baseline and stable Postop Assessment: no apparent nausea or vomiting Anesthetic complications: no   No notable events documented.  Last Vitals:  Vitals:   05/13/24 2045 05/13/24 2103  BP: 124/70 121/72  Pulse: 78 75  Resp: 20 18  Temp: 36.9 C (!) 36.2 C  SpO2: 93% 95%    Last Pain:  Vitals:   05/13/24 2103  TempSrc: Rectal  PainSc:                  Franky BIRCH Bald

## 2024-05-13 NOTE — H&P (View-Only) (Signed)
 Reason for Consult:Left hip fx Referring Physician: Afton Louder Time called: 0935 Time at bedside: 0944   [name] Cummings is an 63 y.o. male.  HPI: Nat fell at home while walking in the yard. He was brought to the ED at [facility] where x-rays showed a left hip fx. He was deemed to high a cardiac risk for surgery there and was transferred to [facility] for cardial clearance and definitive care. 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  Past Medical History:  Diagnosis Date   Anxiety    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   CAD (coronary artery disease)    a. s/p BMS to LAD in 2007 with stent thrombosis and VF arrest and LAD re-stented b. DES to distal LAD in 2010 c. ISR and DES to LAD in 2017 d. DES to LCx in 04/2017 and severe ostial stenosis of small RCA e. 05/2019: DES to proximal LAD.    CHF (congestive heart failure) (HCC)    Depression    Dizziness    Dysphagia, unspecified(787.20)    GERD (gastroesophageal reflux disease)    History of seizure disorder    Hyperlipidemia, mixed    Hypertension    Hypertensive heart disease    unspecified   Ischemic cardiomyopathy    a. 06/2014 Echo: EF 60-65%.   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    Past Surgical History:  Procedure Laterality Date   bedside tracheostomy     with #6 Shiley. 04/14/2006   CARDIAC CATHETERIZATION  09/2005   with bare metal stent to the mid LAD (Minivision 2.5 x 18 mm) with Provisional balloon angioplasty to the second diagonal.   CARDIAC CATHETERIZATION  03/2006   with stenting of the mid LAD with Taxus 2.5 x 20 mm and 2.5 x 8 mm stents.       CARDIAC CATHETERIZATION N/A 07/20/2015   Procedure: Left Heart Cath and Coronary Angiography;  Surgeon: Candyce GORMAN Reek, MD;  Location: [facility] INVASIVE CV LAB;  Service: Cardiovascular;   Laterality: N/A;   CARDIAC CATHETERIZATION N/A 07/20/2015   Procedure: Coronary Stent Intervention;  Surgeon: Candyce GORMAN Reek, MD;  Location: [facility] INVASIVE CV LAB;  Service: Cardiovascular;  Laterality: N/A;   COLONOSCOPY WITH PROPOFOL  N/A 12/05/2022   Procedure: COLONOSCOPY WITH PROPOFOL ;  Surgeon: Rollin Dover, MD;  Location: [facility] ENDOSCOPY;  Service: Gastroenterology;  Laterality: N/A;   CORONARY ANGIOGRAPHY N/A 11/06/2021   Procedure: CORONARY ANGIOGRAPHY;  Surgeon: Wonda Sharper, MD;  Location: [facility] INVASIVE CV LAB;  Service: Cardiovascular;  Laterality: N/A;   CORONARY STENT INTERVENTION N/A 05/15/2017   Procedure: CORONARY STENT INTERVENTION;  Surgeon: Wonda Sharper, MD;  Location: [name] INVASIVE CV LAB;  Service: Cardiovascular;  Laterality: N/A;   CORONARY STENT INTERVENTION N/A 05/27/2019   Procedure: CORONARY STENT INTERVENTION;  Surgeon: Wonda Sharper, MD;  Location: [facility] INVASIVE CV LAB;  Service: Cardiovascular;  Laterality: N/A;   CORONARY STENT INTERVENTION N/A 11/06/2021   Procedure: CORONARY STENT INTERVENTION;  Surgeon: Wonda Sharper, MD;  Location: [facility] INVASIVE CV LAB;  Service: Cardiovascular;  Laterality: N/A;   CORONARY STENT PLACEMENT  07/20/2015   LAD with DES   ESOPHAGOGASTRODUODENOSCOPY     KIDNEY STONE SURGERY     LEFT HEART CATH AND CORONARY ANGIOGRAPHY N/A 05/15/2017  Procedure: LEFT HEART CATH AND CORONARY ANGIOGRAPHY;  Surgeon: Wonda Sharper, MD;  Location: [facility] INVASIVE CV LAB;  Service: Cardiovascular;  Laterality: N/A;   LEFT HEART CATH AND CORONARY ANGIOGRAPHY N/A 05/27/2019   Procedure: LEFT HEART CATH AND CORONARY ANGIOGRAPHY;  Surgeon: Wonda Sharper, MD;  Location: [facility] INVASIVE CV LAB;  Service: Cardiovascular;  Laterality: N/A;   LEFT HEART CATH AND CORONARY ANGIOGRAPHY N/A 10/21/2021   Procedure: LEFT HEART CATH AND CORONARY ANGIOGRAPHY;  Surgeon: Mady Bruckner, MD;  Location: [facility] INVASIVE CV LAB;  Service: Cardiovascular;  Laterality: N/A;   PEG TUBE  PLACEMENT     PEG TUBE REMOVAL     POLYPECTOMY  12/05/2022   Procedure: POLYPECTOMY;  Surgeon: Rollin Dover, MD;  Location: [facility] ENDOSCOPY;  Service: Gastroenterology;;   stent thrombosis  03/2006   Complicated my sudden cardiac death   TRACHEOSTOMY CLOSURE      Family History  Problem Relation Age of Onset   Heart attack Mother    Diabetes Mother    Hydrocephalus Mother    Heart attack Father    Cancer Father        unsure of type   Diabetes Sister    Coronary artery disease Neg Hx        Siblings    Social History:  reports that he has never smoked. He quit smokeless tobacco use about 18 years ago.  His smokeless tobacco use included chew. He reports that he does not drink alcohol and does not use drugs.  Allergies:  Allergies  Allergen Reactions   Sulfamethoxazole-Trimethoprim Rash and Other (See Comments)    Sweet's syndrome (Sweet's syndrome is an uncommon skin condition marked by a distinctive eruption of tiny bumps that enlarge and are often tender to the touch.)   Hydromorphone Hcl Other (See Comments)    Left side went numb Per family pt is allergic   Aripiprazole Other (See Comments)   Crestor  [Rosuvastatin  Calcium ] Other (See Comments)    Myalgias   Duloxetine Other (See Comments)   Imdur  [Isosorbide  Nitrate]     Dizziness   Lorazepam Other (See Comments)    Hallucinations    Pseudoephedrine Other (See Comments)    Caused a seizure   Terfenadine Other (See Comments)    Reaction not known   Atorvastatin  Other (See Comments)    Muscle pain   Biaxin [Clarithromycin] Rash    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  Results for orders placed or performed during the hospital encounter of 05/12/24 (from the past 48 hours)  Comprehensive metabolic panel      Status: Abnormal   Collection Time: 05/12/24  6:00 PM  Result Value Ref Range   Sodium 139 135 - 145 mmol/L   Potassium 4.4 3.5 - 5.1 mmol/L   Chloride 103 98 - 111 mmol/L   CO2 26 22 - 32 mmol/L   Glucose, Bld 105 (H) 70 - 99 mg/dL    Comment: Glucose reference range applies only to samples taken after fasting for at least 8 hours.   BUN 19 8 - 23 mg/dL   Creatinine, Ser 8.96 0.61 - 1.24 mg/dL   Calcium  9.2 8.9 - 10.3 mg/dL   Total Protein 6.8 6.5 - 8.1 g/dL   Albumin 4.5 3.5 - 5.0 g/dL   AST 24 15 -  41 U/L   ALT 19 0 - 44 U/L   Alkaline Phosphatase 136 (H) 38 - 126 U/L   Total Bilirubin 0.6 0.0 - 1.2 mg/dL   GFR, Estimated >39 >39 mL/min    Comment: (NOTE) Calculated using the CKD-EPI Creatinine Equation (2021)    Anion gap 10 5 - 15    Comment: Performed at [facility], [address], [place], [place] [address]  CBC with Differential     Status: None   Collection Time: 05/12/24  6:00 PM  Result Value Ref Range   WBC 7.9 4.0 - 10.5 K/uL   RBC 4.75 4.22 - 5.81 MIL/uL   Hemoglobin 14.0 13.0 - 17.0 g/dL   HCT 58.7 60.9 - 47.9 %   MCV 86.7 80.0 - 100.0 fL   MCH 29.5 26.0 - 34.0 pg   MCHC 34.0 30.0 - 36.0 g/dL   RDW 88.1 88.4 - 84.4 %   Platelets 240 150 - 400 K/uL   nRBC 0.0 0.0 - 0.2 %   Neutrophils Relative % 66 %   Neutro Abs 5.3 1.7 - 7.7 K/uL   Lymphocytes Relative 19 %   Lymphs Abs 1.5 0.7 - 4.0 K/uL   Monocytes Relative 8 %   Monocytes Absolute 0.6 0.1 - 1.0 K/uL   Eosinophils Relative 5 %   Eosinophils Absolute 0.4 0.0 - 0.5 K/uL   Basophils Relative 1 %   Basophils Absolute 0.1 0.0 - 0.1 K/uL   Immature Granulocytes 1 %   Abs Immature Granulocytes 0.04 0.00 - 0.07 K/uL    Comment: Performed at [facility], [address] [place], [place] [address]  Protime-INR     Status: None   Collection Time: 05/12/24  6:00 PM  Result Value Ref Range   Prothrombin Time 13.6 11.4 - 15.2 seconds   INR 1.0 0.8 - 1.2    Comment: (NOTE) INR goal varies based on device  and disease states. Performed at [facility], [address] [place], [place] [address]     CT Cervical Spine Wo Contrast Result Date: 05/12/2024 EXAM: CT CERVICAL SPINE WITHOUT CONTRAST 05/12/2024 07:24:00 PM TECHNIQUE: CT of the cervical spine was performed without the administration of intravenous contrast. Multiplanar reformatted images are provided for review. Automated exposure control, iterative reconstruction, and/or weight based adjustment of the mA/kV was utilized to reduce the radiation dose to as low as reasonably achievable. COMPARISON: 10/23/2020 CLINICAL HISTORY: Recent fall with neck pain. FINDINGS: CERVICAL SPINE: BONES AND ALIGNMENT: Loss of the normal cervical lordosis is noted. The odontoid is within normal limits. No acute fracture or acute facet abnormality is seen. DEGENERATIVE CHANGES: Multilevel osteophytic changes are seen. Multilevel facet hypertrophic changes are noted as well. SOFT TISSUES: Surrounding soft tissue structures are unremarkable. Visualized lung apices are within normal limits. IMPRESSION: 1. No acute fracture or acute facet abnormality. 2. Loss of the normal cervical lordosis. 3. Multilevel osteophytic and facet hypertrophic changes. Electronically signed by: Oneil Devonshire MD 05/12/2024 07:30 PM EST RP Workstation: [id number]   CT Head Wo Contrast Result Date: 05/12/2024 EXAM: CT HEAD WITHOUT CONTRAST 05/12/2024 07:24:00 PM TECHNIQUE: CT of the head was performed without the administration of intravenous contrast. Automated exposure control, iterative reconstruction, and/or weight based adjustment of the mA/kV was utilized to reduce the radiation dose to as low as reasonably achievable. COMPARISON: 4 x 26 x 22 CLINICAL HISTORY: Head trauma, abnormal mental status (Age 33-64y). FINDINGS: BRAIN AND VENTRICLES: No acute hemorrhage. No evidence of acute infarct. Cerebral atrophy.  Ventriculomegaly out of proportion to sulcal enlargement. ORBITS: No acute abnormality.  SINUSES: No acute abnormality. SOFT TISSUES AND SKULL: No acute soft tissue abnormality. No skull fracture. IMPRESSION: 1. No acute intracranial abnormality. 2. Stable Ventriculomegaly out of proportion to sulcal enlargement. Electronically signed by: Oneil Devonshire MD 05/12/2024 07:28 PM EST RP Workstation: [id number]   DG Chest 1 View Result Date: 05/12/2024 CLINICAL DATA:  Fall with hip fracture EXAM: CHEST  1 VIEW COMPARISON:  10/23/2020 FINDINGS: Cardiomegaly. No acute airspace disease or effusion. No pneumothorax. Age indeterminate left fifth sixth and seventh lateral rib fractures. IMPRESSION: No active disease. Cardiomegaly. Age indeterminate left fifth through seventh lateral rib fractures. Electronically Signed   By: Luke Bun M.D.   On: 05/12/2024 18:37   DG Hip Unilat W or Wo Pelvis 2-3 Views Left Result Date: 05/12/2024 CLINICAL DATA:  Fall pain EXAM: DG HIP (WITH OR WITHOUT PELVIS) 2-3V LEFT COMPARISON:  10/19/2023 FINDINGS: Acute mildly displaced left femoral neck fracture. No femoral head dislocation. Pubic symphysis and rami are intact. Moderate right hip degenerative change IMPRESSION: Acute mildly displaced left femoral neck fracture. Electronically Signed   By: Luke Bun M.D.   On: 05/12/2024 18:36    Review of Systems  Unable to perform ROS: Mental status change   Blood pressure (!) 147/76, pulse 81, temperature 98.4 F (36.9 C), temperature source Oral, resp. rate 18, height 6' 2 (1.88 m), weight 106.6 kg, SpO2 96%. Physical Exam Constitutional:      General: He is not in acute distress.    Appearance: He is well-developed. He is not diaphoretic.  HENT:     Head: Normocephalic and atraumatic.  Eyes:     General: No scleral icterus.       Right eye: No discharge.        Left eye: No discharge.     Conjunctiva/sclera: Conjunctivae normal.  Cardiovascular:     Rate and Rhythm: Normal rate and regular rhythm.  Pulmonary:     Effort: Pulmonary effort is normal. No  respiratory distress.  Musculoskeletal:     Cervical back: Normal range of motion.     Comments: LLE No traumatic wounds, ecchymosis, or rash  Mod TTP hip  No knee or ankle effusion  Knee stable to varus/ valgus and anterior/posterior stress  Sens DPN, SPN, TN intact  Motor EHL, ext, flex, evers 5/5  DP 2+, PT 2+, No significant edema  Skin:    General: Skin is warm and dry.  Neurological:     Mental Status: He is alert.  Psychiatric:        Mood and Affect: Mood normal.        Behavior: Behavior normal.     Assessment/Plan: Left hip fx -- Plan THA today with Dr. Fidel if he can get cleared beforehand.  Multiple medical problems including anoxic brain injury, coronary artery disease, hypertension, ventricular fibrillation, and seizures -- per primary service    Ozell DOROTHA Ned, PA-C Orthopedic Surgery [phone] 05/13/2024, 9:52 AM

## 2024-05-13 NOTE — Anesthesia Procedure Notes (Signed)
 Procedure Name: Intubation Date/Time: 05/13/2024 5:46 PM  Performed by: Corinne Garnette BRAVO, MDPre-anesthesia Checklist: Patient identified, Emergency Drugs available, Suction available and Patient being monitored Patient Re-evaluated:Patient Re-evaluated prior to induction Oxygen Delivery Method: Circle system utilized Preoxygenation: Pre-oxygenation with 100% oxygen Induction Type: IV induction Ventilation: Mask ventilation without difficulty Laryngoscope Size: Mac and 4 Grade View: Grade I Tube type: Oral Tube size: 7.0 mm Number of attempts: 1 Airway Equipment and Method: Stylet and Oral airway Placement Confirmation: ETT inserted through vocal cords under direct vision, positive ETCO2 and breath sounds checked- equal and bilateral Secured at: 22 cm Tube secured with: Tape Dental Injury: Teeth and Oropharynx as per pre-operative assessment

## 2024-05-13 NOTE — Consult Note (Signed)
 Reason for Consult:Left hip fx Referring Physician: Afton Louder Time called: 0935 Time at bedside: 0944   [name] Cummings is an 63 y.o. male.  HPI: Nat fell at home while walking in the yard. He was brought to the ED at [facility] where x-rays showed a left hip fx. He was deemed to high a cardiac risk for surgery there and was transferred to [facility] for cardial clearance and definitive care. 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  Past Medical History:  Diagnosis Date   Anxiety    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   CAD (coronary artery disease)    a. s/p BMS to LAD in 2007 with stent thrombosis and VF arrest and LAD re-stented b. DES to distal LAD in 2010 c. ISR and DES to LAD in 2017 d. DES to LCx in 04/2017 and severe ostial stenosis of small RCA e. 05/2019: DES to proximal LAD.    CHF (congestive heart failure) (HCC)    Depression    Dizziness    Dysphagia, unspecified(787.20)    GERD (gastroesophageal reflux disease)    History of seizure disorder    Hyperlipidemia, mixed    Hypertension    Hypertensive heart disease    unspecified   Ischemic cardiomyopathy    a. 06/2014 Echo: EF 60-65%.   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    Past Surgical History:  Procedure Laterality Date   bedside tracheostomy     with #6 Shiley. 04/14/2006   CARDIAC CATHETERIZATION  09/2005   with bare metal stent to the mid LAD (Minivision 2.5 x 18 mm) with Provisional balloon angioplasty to the second diagonal.   CARDIAC CATHETERIZATION  03/2006   with stenting of the mid LAD with Taxus 2.5 x 20 mm and 2.5 x 8 mm stents.       CARDIAC CATHETERIZATION N/A 07/20/2015   Procedure: Left Heart Cath and Coronary Angiography;  Surgeon: Candyce GORMAN Reek, MD;  Location: [facility] INVASIVE CV LAB;  Service: Cardiovascular;   Laterality: N/A;   CARDIAC CATHETERIZATION N/A 07/20/2015   Procedure: Coronary Stent Intervention;  Surgeon: Candyce GORMAN Reek, MD;  Location: [facility] INVASIVE CV LAB;  Service: Cardiovascular;  Laterality: N/A;   COLONOSCOPY WITH PROPOFOL  N/A 12/05/2022   Procedure: COLONOSCOPY WITH PROPOFOL ;  Surgeon: Rollin Dover, MD;  Location: [facility] ENDOSCOPY;  Service: Gastroenterology;  Laterality: N/A;   CORONARY ANGIOGRAPHY N/A 11/06/2021   Procedure: CORONARY ANGIOGRAPHY;  Surgeon: Wonda Sharper, MD;  Location: [facility] INVASIVE CV LAB;  Service: Cardiovascular;  Laterality: N/A;   CORONARY STENT INTERVENTION N/A 05/15/2017   Procedure: CORONARY STENT INTERVENTION;  Surgeon: Wonda Sharper, MD;  Location: [name] INVASIVE CV LAB;  Service: Cardiovascular;  Laterality: N/A;   CORONARY STENT INTERVENTION N/A 05/27/2019   Procedure: CORONARY STENT INTERVENTION;  Surgeon: Wonda Sharper, MD;  Location: [facility] INVASIVE CV LAB;  Service: Cardiovascular;  Laterality: N/A;   CORONARY STENT INTERVENTION N/A 11/06/2021   Procedure: CORONARY STENT INTERVENTION;  Surgeon: Wonda Sharper, MD;  Location: [facility] INVASIVE CV LAB;  Service: Cardiovascular;  Laterality: N/A;   CORONARY STENT PLACEMENT  07/20/2015   LAD with DES   ESOPHAGOGASTRODUODENOSCOPY     KIDNEY STONE SURGERY     LEFT HEART CATH AND CORONARY ANGIOGRAPHY N/A 05/15/2017  Procedure: LEFT HEART CATH AND CORONARY ANGIOGRAPHY;  Surgeon: Wonda Sharper, MD;  Location: [facility] INVASIVE CV LAB;  Service: Cardiovascular;  Laterality: N/A;   LEFT HEART CATH AND CORONARY ANGIOGRAPHY N/A 05/27/2019   Procedure: LEFT HEART CATH AND CORONARY ANGIOGRAPHY;  Surgeon: Wonda Sharper, MD;  Location: [facility] INVASIVE CV LAB;  Service: Cardiovascular;  Laterality: N/A;   LEFT HEART CATH AND CORONARY ANGIOGRAPHY N/A 10/21/2021   Procedure: LEFT HEART CATH AND CORONARY ANGIOGRAPHY;  Surgeon: Mady Bruckner, MD;  Location: [facility] INVASIVE CV LAB;  Service: Cardiovascular;  Laterality: N/A;   PEG TUBE  PLACEMENT     PEG TUBE REMOVAL     POLYPECTOMY  12/05/2022   Procedure: POLYPECTOMY;  Surgeon: Rollin Dover, MD;  Location: [facility] ENDOSCOPY;  Service: Gastroenterology;;   stent thrombosis  03/2006   Complicated my sudden cardiac death   TRACHEOSTOMY CLOSURE      Family History  Problem Relation Age of Onset   Heart attack Mother    Diabetes Mother    Hydrocephalus Mother    Heart attack Father    Cancer Father        unsure of type   Diabetes Sister    Coronary artery disease Neg Hx        Siblings    Social History:  reports that he has never smoked. He quit smokeless tobacco use about 18 years ago.  His smokeless tobacco use included chew. He reports that he does not drink alcohol and does not use drugs.  Allergies:  Allergies  Allergen Reactions   Sulfamethoxazole-Trimethoprim Rash and Other (See Comments)    Sweet's syndrome (Sweet's syndrome is an uncommon skin condition marked by a distinctive eruption of tiny bumps that enlarge and are often tender to the touch.)   Hydromorphone Hcl Other (See Comments)    Left side went numb Per family pt is allergic   Aripiprazole Other (See Comments)   Crestor  [Rosuvastatin  Calcium ] Other (See Comments)    Myalgias   Duloxetine Other (See Comments)   Imdur  [Isosorbide  Nitrate]     Dizziness   Lorazepam Other (See Comments)    Hallucinations    Pseudoephedrine Other (See Comments)    Caused a seizure   Terfenadine Other (See Comments)    Reaction not known   Atorvastatin  Other (See Comments)    Muscle pain   Biaxin [Clarithromycin] Rash    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  Results for orders placed or performed during the hospital encounter of 05/12/24 (from the past 48 hours)  Comprehensive metabolic panel      Status: Abnormal   Collection Time: 05/12/24  6:00 PM  Result Value Ref Range   Sodium 139 135 - 145 mmol/L   Potassium 4.4 3.5 - 5.1 mmol/L   Chloride 103 98 - 111 mmol/L   CO2 26 22 - 32 mmol/L   Glucose, Bld 105 (H) 70 - 99 mg/dL    Comment: Glucose reference range applies only to samples taken after fasting for at least 8 hours.   BUN 19 8 - 23 mg/dL   Creatinine, Ser 8.96 0.61 - 1.24 mg/dL   Calcium  9.2 8.9 - 10.3 mg/dL   Total Protein 6.8 6.5 - 8.1 g/dL   Albumin 4.5 3.5 - 5.0 g/dL   AST 24 15 -  41 U/L   ALT 19 0 - 44 U/L   Alkaline Phosphatase 136 (H) 38 - 126 U/L   Total Bilirubin 0.6 0.0 - 1.2 mg/dL   GFR, Estimated >39 >39 mL/min    Comment: (NOTE) Calculated using the CKD-EPI Creatinine Equation (2021)    Anion gap 10 5 - 15    Comment: Performed at [facility], [address], [place], [place] [address]  CBC with Differential     Status: None   Collection Time: 05/12/24  6:00 PM  Result Value Ref Range   WBC 7.9 4.0 - 10.5 K/uL   RBC 4.75 4.22 - 5.81 MIL/uL   Hemoglobin 14.0 13.0 - 17.0 g/dL   HCT 58.7 60.9 - 47.9 %   MCV 86.7 80.0 - 100.0 fL   MCH 29.5 26.0 - 34.0 pg   MCHC 34.0 30.0 - 36.0 g/dL   RDW 88.1 88.4 - 84.4 %   Platelets 240 150 - 400 K/uL   nRBC 0.0 0.0 - 0.2 %   Neutrophils Relative % 66 %   Neutro Abs 5.3 1.7 - 7.7 K/uL   Lymphocytes Relative 19 %   Lymphs Abs 1.5 0.7 - 4.0 K/uL   Monocytes Relative 8 %   Monocytes Absolute 0.6 0.1 - 1.0 K/uL   Eosinophils Relative 5 %   Eosinophils Absolute 0.4 0.0 - 0.5 K/uL   Basophils Relative 1 %   Basophils Absolute 0.1 0.0 - 0.1 K/uL   Immature Granulocytes 1 %   Abs Immature Granulocytes 0.04 0.00 - 0.07 K/uL    Comment: Performed at [facility], [address] [place], [place] [address]  Protime-INR     Status: None   Collection Time: 05/12/24  6:00 PM  Result Value Ref Range   Prothrombin Time 13.6 11.4 - 15.2 seconds   INR 1.0 0.8 - 1.2    Comment: (NOTE) INR goal varies based on device  and disease states. Performed at [facility], [address] [place], [place] [address]     CT Cervical Spine Wo Contrast Result Date: 05/12/2024 EXAM: CT CERVICAL SPINE WITHOUT CONTRAST 05/12/2024 07:24:00 PM TECHNIQUE: CT of the cervical spine was performed without the administration of intravenous contrast. Multiplanar reformatted images are provided for review. Automated exposure control, iterative reconstruction, and/or weight based adjustment of the mA/kV was utilized to reduce the radiation dose to as low as reasonably achievable. COMPARISON: 10/23/2020 CLINICAL HISTORY: Recent fall with neck pain. FINDINGS: CERVICAL SPINE: BONES AND ALIGNMENT: Loss of the normal cervical lordosis is noted. The odontoid is within normal limits. No acute fracture or acute facet abnormality is seen. DEGENERATIVE CHANGES: Multilevel osteophytic changes are seen. Multilevel facet hypertrophic changes are noted as well. SOFT TISSUES: Surrounding soft tissue structures are unremarkable. Visualized lung apices are within normal limits. IMPRESSION: 1. No acute fracture or acute facet abnormality. 2. Loss of the normal cervical lordosis. 3. Multilevel osteophytic and facet hypertrophic changes. Electronically signed by: Oneil Devonshire MD 05/12/2024 07:30 PM EST RP Workstation: [id number]   CT Head Wo Contrast Result Date: 05/12/2024 EXAM: CT HEAD WITHOUT CONTRAST 05/12/2024 07:24:00 PM TECHNIQUE: CT of the head was performed without the administration of intravenous contrast. Automated exposure control, iterative reconstruction, and/or weight based adjustment of the mA/kV was utilized to reduce the radiation dose to as low as reasonably achievable. COMPARISON: 4 x 26 x 22 CLINICAL HISTORY: Head trauma, abnormal mental status (Age 33-64y). FINDINGS: BRAIN AND VENTRICLES: No acute hemorrhage. No evidence of acute infarct. Cerebral atrophy.  Ventriculomegaly out of proportion to sulcal enlargement. ORBITS: No acute abnormality.  SINUSES: No acute abnormality. SOFT TISSUES AND SKULL: No acute soft tissue abnormality. No skull fracture. IMPRESSION: 1. No acute intracranial abnormality. 2. Stable Ventriculomegaly out of proportion to sulcal enlargement. Electronically signed by: Oneil Devonshire MD 05/12/2024 07:28 PM EST RP Workstation: [id number]   DG Chest 1 View Result Date: 05/12/2024 CLINICAL DATA:  Fall with hip fracture EXAM: CHEST  1 VIEW COMPARISON:  10/23/2020 FINDINGS: Cardiomegaly. No acute airspace disease or effusion. No pneumothorax. Age indeterminate left fifth sixth and seventh lateral rib fractures. IMPRESSION: No active disease. Cardiomegaly. Age indeterminate left fifth through seventh lateral rib fractures. Electronically Signed   By: Luke Bun M.D.   On: 05/12/2024 18:37   DG Hip Unilat W or Wo Pelvis 2-3 Views Left Result Date: 05/12/2024 CLINICAL DATA:  Fall pain EXAM: DG HIP (WITH OR WITHOUT PELVIS) 2-3V LEFT COMPARISON:  10/19/2023 FINDINGS: Acute mildly displaced left femoral neck fracture. No femoral head dislocation. Pubic symphysis and rami are intact. Moderate right hip degenerative change IMPRESSION: Acute mildly displaced left femoral neck fracture. Electronically Signed   By: Luke Bun M.D.   On: 05/12/2024 18:36    Review of Systems  Unable to perform ROS: Mental status change   Blood pressure (!) 147/76, pulse 81, temperature 98.4 F (36.9 C), temperature source Oral, resp. rate 18, height 6' 2 (1.88 m), weight 106.6 kg, SpO2 96%. Physical Exam Constitutional:      General: He is not in acute distress.    Appearance: He is well-developed. He is not diaphoretic.  HENT:     Head: Normocephalic and atraumatic.  Eyes:     General: No scleral icterus.       Right eye: No discharge.        Left eye: No discharge.     Conjunctiva/sclera: Conjunctivae normal.  Cardiovascular:     Rate and Rhythm: Normal rate and regular rhythm.  Pulmonary:     Effort: Pulmonary effort is normal. No  respiratory distress.  Musculoskeletal:     Cervical back: Normal range of motion.     Comments: LLE No traumatic wounds, ecchymosis, or rash  Mod TTP hip  No knee or ankle effusion  Knee stable to varus/ valgus and anterior/posterior stress  Sens DPN, SPN, TN intact  Motor EHL, ext, flex, evers 5/5  DP 2+, PT 2+, No significant edema  Skin:    General: Skin is warm and dry.  Neurological:     Mental Status: He is alert.  Psychiatric:        Mood and Affect: Mood normal.        Behavior: Behavior normal.     Assessment/Plan: Left hip fx -- Plan THA today with Dr. Fidel if he can get cleared beforehand.  Multiple medical problems including anoxic brain injury, coronary artery disease, hypertension, ventricular fibrillation, and seizures -- per primary service    Ozell DOROTHA Ned, PA-C Orthopedic Surgery [phone] 05/13/2024, 9:52 AM

## 2024-05-13 NOTE — Progress Notes (Signed)
 Initial Nutrition Assessment  DOCUMENTATION CODES:   Obesity unspecified  INTERVENTION:  Recommend Regular diet post op Encourage PO intake Ensure Plus High Protein (vanilla)  po BID, each supplement provides 350 kcal and 20 grams of protein.  NUTRITION DIAGNOSIS:   Increased nutrient needs related to post-op healing as evidenced by  (hip fracture).  GOAL:   Patient will meet greater than or equal to 90% of their needs   MONITOR:   PO intake, Supplement acceptance  REASON FOR ASSESSMENT:   Consult Assessment of nutrition requirement/status  ASSESSMENT:   medical history significant for anoxic brain injury, coronary artery disease, hypertension, ventricular fibrillation, seizures. Patient was brought to the ED after a fall.  History is obtained from chart review and talking to family spouse and daughter at bedside.  Due to patient's anoxic brain injury, he has no short-term memory, and repeats questions multiple times.  Patient seen in room, spouse at bedside. Transferred from [facility] this morning for further orthopedic evaluation. Pt with h/o anoxic brain injury, has short term memory loss so spouse provided information. She reports no changes to patient's PO intake prior to his fall. Pt drinks ensure shakes at home and prefers vanilla flavor. UBW 235 lbs reported with no recent significant  changes. Currently NPO for surgery with Ortho later today. Pt c/o of dry mouth and being thirsty, RD brought pt mouth swabs for comfort.     Admit/current weight: 106 kg  Weight stable PTA per spouse.   Nutritionally Relevant Medications: Reviewed   Labs Reviewed: Glu 105  NUTRITION - FOCUSED PHYSICAL EXAM:  Flowsheet Row Most Recent Value  Orbital Region No depletion  Upper Arm Region No depletion  Thoracic and Lumbar Region No depletion  Buccal Region No depletion  Temple Region No depletion  Clavicle Bone Region No depletion  Clavicle and Acromion Bone Region No depletion   Scapular Bone Region No depletion  Dorsal Hand No depletion  Patellar Region No depletion  Anterior Thigh Region No depletion  Posterior Calf Region No depletion  Hair Reviewed  Eyes Reviewed  Mouth Reviewed  Skin Reviewed  Nails Reviewed    Diet Order:   Diet Order             Diet NPO time specified Except for: Sips with Meds, Ice Chips  Diet effective now                   EDUCATION NEEDS:   Education needs have been addressed  Skin:  Skin Assessment: Reviewed RN Assessment  Last BM:  PTA  Height:   Ht Readings from Last 1 Encounters:  05/12/24 6' 2 (1.88 m)    Weight:   Wt Readings from Last 1 Encounters:  05/13/24 107.5 kg    Ideal Body Weight:  86.36 kg  BMI:  Body mass index is 30.43 kg/m.  Estimated Nutritional Needs:   Kcal:  2160-2590  Protein:  104-130 g  Fluid:  >2L/d  [name] Potters, MS, RD, LDN Clinical Dietitian  Contact via secure chat. If unavailable, use group chat RD Inpatient.

## 2024-05-13 NOTE — Progress Notes (Signed)
 PROGRESS NOTE   [name] Cummings  FMW:[id number] DOB: Sep 22, 1960 DOA: 05/12/2024 PCP: System, Provider Not In   Chief Complaint  Patient presents with   Fall   Hip Injury   Level of care: Telemetry  Brief Admission History:  63 y.o. male with medical history significant for anoxic brain injury, coronary artery disease, hypertension, ventricular fibrillation, seizures. Patient was brought to the ED after a fall.  History is obtained from chart review and talking to family spouse and daughter at bedside.  Due to patient's anoxic brain injury, he has no short-term memory, and repeats questions multiple times.  Patient was walking outside wearing flip-flops when he fell.  Family thinks he may have tripped on the flip-flops, as it has happened before.  His son and daughter were there but did not witness the fall, but saw him on the floor.  He had otherwise been doing well, he ambulates without assistance or assistive devices.  He has no chest pain, no difficulty breathing, no cough.    ED Course: Temperature 98.9.  Heart rate 65-82.  Respiratory 18-27..  O2 sats initially 90 to 93% dropped to 88 percent about an hour after morphine was given. Pelvic x-ray - acute mildly displaced left femoral neck fracture. Chest x-ray-no active disease, age-indeterminate left 5th through 7th lateral rib fractures.  Head and cervical CT negative for acute abnormality, stable ventriculomegaly out of proportion to sulcal enlargement.  IV morphine 4 mg x 1.  1 L bolus.  EDP consulted Dr. Margrette, due to patient's medical history, patient too complex for any pain anesthesia, recommended transfer to [facility].  EDP talk to Dr. Teresa with [facility], recommends transfer, hold Plavix .   Assessment and Plan:  Closed left hip fracture-status post fall.  Unknown reason for fall.  Hip x-ray - Acute mildly displaced left femoral neck fracture  - EDP talked to Dr. Christi, recommended transfer to [facility], patient too  complex for [facility] anesthesia. - EDP talked to [facility], recommended transfer, under hospitalist service, hold Plavix . - IV morphine 4 mg Q4 hourly as needed - NPO pending OR time later today at [facility]    Acute hypoxic respiratory failure-likely narcotic induced.   RESOLVED NOW O2 sats dropped to 88% on room air about an hour after morphine.  He has no cardiorespiratory symptoms at this time and chest x-ray is clear. Remains on room air at this time   Coronary artery disease history-no chest pain at this time.  Follows with cardiology Dr. Alvan.  History of multiple stents placed between 2007-2023.  With initial bare-metal stents to LAD 2007.  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  LAD was restented at that time, he has had several subsequent stents since then.  Most recent 2023-patient had PCI as opposed to CABG given his cognitive status and after discussions with family. - Consult cardiology for cardiac clearance when arrives at [facility] - Hold Plavix  for now - Resume aspirin , pravastatin    Anoxic brain injury/short-term memory deficits- 2/2 heart attack in 2007, ventricular fibrillation.  Repeats questions several times-per family every 10 seconds, but recognizes family. - Med reconciliation pending, marked ASAP notify MD when complete   Hypertension-suboptimally controlled, possibly from pain - Resume carvedilol  for now, 25 mg BID    Ischemic cardiomyopathy-volume status stable.  Last echo 2023 EF 55 to 60%.  No on diuretics.   DVT prophylaxis: SCDs Code Status: Full  Family Communication: bedside update in [facility] 11/14 prior to  transfer Disposition: transferring to [facility]   Consultants:  Orthopedics [facility]  Procedures:   Antimicrobials:    Subjective: Still having left hip pain, difficult historian due to TBI  Objective: Vitals:   05/13/24 0615 05/13/24 0630 05/13/24 0645 05/13/24 0700  BP: (!) 154/80 (!) 162/82 (!) 155/84 (!) 160/83  Pulse: 86 87 83  88  Resp: (!) 29 20 (!) 28 (!) 21  Temp:      TempSrc:      SpO2: 95% 95% 96% 95%  Weight:      Height:        Intake/Output Summary (Last 24 hours) at 05/13/2024 0720 Last data filed at 05/12/2024 2119 Gross per 24 hour  Intake 1000 ml  Output --  Net 1000 ml   Filed Weights   05/12/24 1754  Weight: 106.6 kg   Examination:  General exam: Appears calm and comfortable, NAD  Respiratory system: Clear to auscultation. Respiratory effort normal. Cardiovascular system: normal S1 & S2 heard. No JVD, murmurs, rubs, gallops or clicks. No pedal edema. Gastrointestinal system: Abdomen is nondistended, soft and nontender. No organomegaly or masses felt. Normal bowel sounds heard. Central nervous system: Alert and oriented. No focal neurological deficits. Extremities: distal pulses palpable BLEs Skin: No rashes, lesions or ulcers. Psychiatry: Judgement and insight appear diminshed. Mood & affect appropriate.   Data Reviewed: I have personally reviewed following labs and imaging studies  CBC: Recent Labs  Lab 05/12/24 1800  WBC 7.9  NEUTROABS 5.3  HGB 14.0  HCT 41.2  MCV 86.7  PLT 240    Basic Metabolic Panel: Recent Labs  Lab 05/12/24 1800  NA 139  K 4.4  CL 103  CO2 26  GLUCOSE 105*  BUN 19  CREATININE 1.03  CALCIUM  9.2    CBG: No results for input(s): GLUCAP in the last 168 hours.  No results found for this or any previous visit (from the past 240 hours).   Radiology Studies: CT Cervical Spine Wo Contrast Result Date: 05/12/2024 EXAM: CT CERVICAL SPINE WITHOUT CONTRAST 05/12/2024 07:24:00 PM TECHNIQUE: CT of the cervical spine was performed without the administration of intravenous contrast. Multiplanar reformatted images are provided for review. Automated exposure control, iterative reconstruction, and/or weight based adjustment of the mA/kV was utilized to reduce the radiation dose to as low as reasonably achievable. COMPARISON: 10/23/2020 CLINICAL  HISTORY: Recent fall with neck pain. FINDINGS: CERVICAL SPINE: BONES AND ALIGNMENT: Loss of the normal cervical lordosis is noted. The odontoid is within normal limits. No acute fracture or acute facet abnormality is seen. DEGENERATIVE CHANGES: Multilevel osteophytic changes are seen. Multilevel facet hypertrophic changes are noted as well. SOFT TISSUES: Surrounding soft tissue structures are unremarkable. Visualized lung apices are within normal limits. IMPRESSION: 1. No acute fracture or acute facet abnormality. 2. Loss of the normal cervical lordosis. 3. Multilevel osteophytic and facet hypertrophic changes. Electronically signed by: Oneil Devonshire MD 05/12/2024 07:30 PM EST RP Workstation: [id number]   CT Head Wo Contrast Result Date: 05/12/2024 EXAM: CT HEAD WITHOUT CONTRAST 05/12/2024 07:24:00 PM TECHNIQUE: CT of the head was performed without the administration of intravenous contrast. Automated exposure control, iterative reconstruction, and/or weight based adjustment of the mA/kV was utilized to reduce the radiation dose to as low as reasonably achievable. COMPARISON: 4 x 26 x 22 CLINICAL HISTORY: Head trauma, abnormal mental status (Age 77-64y). FINDINGS: BRAIN AND VENTRICLES: No acute hemorrhage. No evidence of acute infarct. Cerebral atrophy. Ventriculomegaly out of proportion to sulcal enlargement. ORBITS: No acute abnormality.  SINUSES: No acute abnormality. SOFT TISSUES AND SKULL: No acute soft tissue abnormality. No skull fracture. IMPRESSION: 1. No acute intracranial abnormality. 2. Stable Ventriculomegaly out of proportion to sulcal enlargement. Electronically signed by: Oneil Devonshire MD 05/12/2024 07:28 PM EST RP Workstation: [id number]   DG Chest 1 View Result Date: 05/12/2024 CLINICAL DATA:  Fall with hip fracture EXAM: CHEST  1 VIEW COMPARISON:  10/23/2020 FINDINGS: Cardiomegaly. No acute airspace disease or effusion. No pneumothorax. Age indeterminate left fifth sixth and seventh lateral rib  fractures. IMPRESSION: No active disease. Cardiomegaly. Age indeterminate left fifth through seventh lateral rib fractures. Electronically Signed   By: Luke Bun M.D.   On: 05/12/2024 18:37   DG Hip Unilat W or Wo Pelvis 2-3 Views Left Result Date: 05/12/2024 CLINICAL DATA:  Fall pain EXAM: DG HIP (WITH OR WITHOUT PELVIS) 2-3V LEFT COMPARISON:  10/19/2023 FINDINGS: Acute mildly displaced left femoral neck fracture. No femoral head dislocation. Pubic symphysis and rami are intact. Moderate right hip degenerative change IMPRESSION: Acute mildly displaced left femoral neck fracture. Electronically Signed   By: Luke Bun M.D.   On: 05/12/2024 18:36    Scheduled Meds:  aspirin  EC  81 mg Oral Daily   carvedilol   25 mg Oral BID WC   pravastatin   80 mg Oral QPM   Continuous Infusions:   LOS: 1 day   Time spent: 40 mins  Lupie Sawa Vicci, MD How to contact the [facility] Attending or Consulting provider 7A - 7P or covering provider during after hours 7P -7A, for this patient?  Check the care team in [facility] and look for a) attending/consulting [facility] provider listed and b) the [facility] team listed Log into [email or web address] to find provider on call.  Locate the [facility] provider you are looking for under [facility] and page to a number that you can be directly reached. If you still have difficulty reaching the provider, please page the [facility] (Director on Call) for the Hospitalists listed on amion for assistance.  05/13/2024, 7:20 AM

## 2024-05-13 NOTE — Interval H&P Note (Signed)
 History and Physical Interval Note:  05/13/2024 5:11 PM  [name] Cummings  has presented today for surgery, with the diagnosis of Left femoral neck fracture.  The various methods of treatment have been discussed with the patient and family. After consideration of risks, benefits and other options for treatment, the patient has consented to  Procedure(s): ARTHROPLASTY, HIP, TOTAL, ANTERIOR APPROACH (Left) as a surgical intervention.  The patient's history has been reviewed, patient examined, no change in status, stable for surgery.  I have reviewed the patient's chart and labs.  Questions were answered to the patient's satisfaction.    The risks, benefits, and alternatives were discussed with the patient. There are risks associated with the surgery including, but not limited to, problems with anesthesia (death), infection, instability (giving out of the joint), dislocation, differences in leg length/angulation/rotation, fracture of bones, loosening or failure of implants, hematoma (blood accumulation) which may require surgical drainage, blood clots, pulmonary embolism, nerve injury (foot drop and lateral thigh numbness), and blood vessel injury. The patient understands these risks and elects to proceed.    Redell PARAS Ziah Turvey

## 2024-05-13 NOTE — Hospital Course (Signed)
 63 y.o. male with medical history significant for anoxic brain injury, coronary artery disease, hypertension, ventricular fibrillation, seizures. Patient was brought to the ED after a fall.  History is obtained from chart review and talking to family spouse and daughter at bedside.  Due to patient's anoxic brain injury, he has no short-term memory, and repeats questions multiple times.  Patient was walking outside wearing flip-flops when he fell.  Family thinks he may have tripped on the flip-flops, as it has happened before.  His son and daughter were there but did not witness the fall, but saw him on the floor.  He had otherwise been doing well, he ambulates without assistance or assistive devices.  He has no chest pain, no difficulty breathing, no cough.    ED Course: Temperature 98.9.  Heart rate 65-82.  Respiratory 18-27..  O2 sats initially 90 to 93% dropped to 88 percent about an hour after morphine was given. Pelvic x-ray - acute mildly displaced left femoral neck fracture. Chest x-ray-no active disease, age-indeterminate left 5th through 7th lateral rib fractures.  Head and cervical CT negative for acute abnormality, stable ventriculomegaly out of proportion to sulcal enlargement.  IV morphine 4 mg x 1.  1 L bolus.  EDP consulted Dr. Margrette, due to patient's medical history, patient too complex for any pain anesthesia, recommended transfer to [facility].  EDP talk to Dr. Teresa with [facility], recommends transfer, hold Plavix .

## 2024-05-13 NOTE — Progress Notes (Addendum)
 Cardiology Consultation   Patient ID: [name] Cummings MRN: [id number]; DOB: Oct 07, 1960  Admit date: 05/12/2024 Date of Consult: 05/13/2024  PCP:  System, Provider Not In   [facility] Providers Cardiologist:  Alvan Carrier, MD       Patient Profile: [name] Cummings is a 63 y.o. male with a hx of VF arrest in 2007, CAD, HCF, HTN, depression, anxiety, anoxic brain injury with persistent short-term memory loss who is being seen 05/13/2024 for preop eval at the request of Dr. Vicci.  History of Present Illness: [name] Cummings is a 63 year old male with above medical history.  Per chart review, patient previously had a bare-metal stent placed to the LAD in 2007.  Later had stent thrombosis and V-fib arrest, LAD restented.  Had DES placed to the distal LAD in 2010.  Had in-stent restenosis and again had DES placed to the LAD in 2017.  Had DES to left circumflex in 04/2017 and was found to have severe ostial stenosis of the small RCA.  In 05/2019, underwent DES to the LAD.    Most recent echocardiogram from 09/2021 showed EF 55-60% with regional wall motion abnormalities.  There was sluggish blood flow in the apex with no obvious LV apical thrombus.  RV systolic function and size were normal.  No significant valvular abnormalities.  Patient's most recent cardiac catheterization was completed in 09/2021.  Found to have significant stenoses of up to 75% involving distal LMCA, ostial LAD, mid LAD.  There was chronic subtotal occlusion of OM1.  75% stenosis in ostial RCA.  Widely patent stent in the ostial/proximal left circumflex. Considered CABG, but decided against it due to patient's anoxic brain injury and after discussion with family.  The patient was taken back to the Cath Lab on 11/06/2021.  Underwent successful stenting of severe distal left main and ostial LAD stenoses.  Of note, recommended lifelong DAPT given stent burden and also left main stent.  Stress test in 05/2023 showed  findings consistent with infarction with peri-infarct ischemia.  There was a large sized, mild-moderate intensity, apical to basal lateral defect that exhibited partial reversibility.  After this test, patient was started on Ranexa .  His chest pain resolved so no further cardiac workup was completed.  Last seen by cardiology in 12/2023.  At that time, patient believed his chest pain was well-controlled.  He had only had to take 1 nitroglycerin  in the past month.  He denied dyspnea on exertion, palpitations, orthopnea, PND, pitting edema.  He had self discontinued his Ranexa  and Zetia .  He remained on aspirin , Plavix , carvedilol , pravastatin , Repatha   Patient has history of anoxic brain injury following VF arrest in 2007. He is unable to provide much history. His wife tells me that patient has not been complaining of chest pain or shortness of breath recently. He has not lost consciousness. He spends most of the day sitting on the couch but he is able to walk around his home without assistant. Wife tells me that he tolerates this well   Past Medical History:  Diagnosis Date   Anxiety    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   CAD (coronary artery disease)    a. s/p BMS to LAD in 2007 with stent thrombosis and VF arrest and LAD re-stented b. DES to distal LAD in 2010 c. ISR and DES to LAD in 2017 d. DES to LCx in 04/2017 and severe ostial stenosis of small RCA e. 05/2019:  DES to proximal LAD.    CHF (congestive heart failure) (HCC)    Depression    Dizziness    Dysphagia, unspecified(787.20)    GERD (gastroesophageal reflux disease)    History of seizure disorder    Hyperlipidemia, mixed    Hypertension    Hypertensive heart disease    unspecified   Ischemic cardiomyopathy    a. 06/2014 Echo: EF 60-65%.   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    Past Surgical  History:  Procedure Laterality Date   bedside tracheostomy     with #6 Shiley. 04/14/2006   CARDIAC CATHETERIZATION  09/2005   with bare metal stent to the mid LAD (Minivision 2.5 x 18 mm) with Provisional balloon angioplasty to the second diagonal.   CARDIAC CATHETERIZATION  03/2006   with stenting of the mid LAD with Taxus 2.5 x 20 mm and 2.5 x 8 mm stents.       CARDIAC CATHETERIZATION N/A 07/20/2015   Procedure: Left Heart Cath and Coronary Angiography;  Surgeon: Candyce GORMAN Reek, MD;  Location: [facility] INVASIVE CV LAB;  Service: Cardiovascular;  Laterality: N/A;   CARDIAC CATHETERIZATION N/A 07/20/2015   Procedure: Coronary Stent Intervention;  Surgeon: Candyce GORMAN Reek, MD;  Location: [facility] INVASIVE CV LAB;  Service: Cardiovascular;  Laterality: N/A;   COLONOSCOPY WITH PROPOFOL  N/A 12/05/2022   Procedure: COLONOSCOPY WITH PROPOFOL ;  Surgeon: Rollin Dover, MD;  Location: [facility] ENDOSCOPY;  Service: Gastroenterology;  Laterality: N/A;   CORONARY ANGIOGRAPHY N/A 11/06/2021   Procedure: CORONARY ANGIOGRAPHY;  Surgeon: Wonda Sharper, MD;  Location: [facility] INVASIVE CV LAB;  Service: Cardiovascular;  Laterality: N/A;   CORONARY STENT INTERVENTION N/A 05/15/2017   Procedure: CORONARY STENT INTERVENTION;  Surgeon: Wonda Sharper, MD;  Location: [facility] INVASIVE CV LAB;  Service: Cardiovascular;  Laterality: N/A;   CORONARY STENT INTERVENTION N/A 05/27/2019   Procedure: CORONARY STENT INTERVENTION;  Surgeon: Wonda Sharper, MD;  Location: [facility] INVASIVE CV LAB;  Service: Cardiovascular;  Laterality: N/A;   CORONARY STENT INTERVENTION N/A 11/06/2021   Procedure: CORONARY STENT INTERVENTION;  Surgeon: Wonda Sharper, MD;  Location: [facility] INVASIVE CV LAB;  Service: Cardiovascular;  Laterality: N/A;   CORONARY STENT PLACEMENT  07/20/2015   LAD with DES   ESOPHAGOGASTRODUODENOSCOPY     KIDNEY STONE SURGERY     LEFT HEART CATH AND CORONARY ANGIOGRAPHY N/A 05/15/2017   Procedure: LEFT HEART CATH AND CORONARY ANGIOGRAPHY;   Surgeon: Wonda Sharper, MD;  Location: [facility] INVASIVE CV LAB;  Service: Cardiovascular;  Laterality: N/A;   LEFT HEART CATH AND CORONARY ANGIOGRAPHY N/A 05/27/2019   Procedure: LEFT HEART CATH AND CORONARY ANGIOGRAPHY;  Surgeon: Wonda Sharper, MD;  Location: [facility] INVASIVE CV LAB;  Service: Cardiovascular;  Laterality: N/A;   LEFT HEART CATH AND CORONARY ANGIOGRAPHY N/A 10/21/2021   Procedure: LEFT HEART CATH AND CORONARY ANGIOGRAPHY;  Surgeon: Mady Bruckner, MD;  Location: [facility] INVASIVE CV LAB;  Service: Cardiovascular;  Laterality: N/A;   PEG TUBE PLACEMENT     PEG TUBE REMOVAL     POLYPECTOMY  12/05/2022   Procedure: POLYPECTOMY;  Surgeon: Rollin Dover, MD;  Location: [facility] ENDOSCOPY;  Service: Gastroenterology;;   stent thrombosis  03/2006   Complicated my sudden cardiac death   TRACHEOSTOMY CLOSURE        Scheduled Meds:  aspirin  EC  81 mg Oral Daily   brexpiprazole  1  mg Oral QHS   busPIRone  15 mg Oral BID   carvedilol   25 mg Oral BID WC   [START ON 05/14/2024] feeding supplement  237 mL Oral BID BM   FLUoxetine   20 mg Oral TID   loratadine   10 mg Oral q morning   oxybutynin  10 mg Oral BID   pantoprazole   40 mg Oral QPM   pravastatin   80 mg Oral QPM   Continuous Infusions:  PRN Meds: albuterol, fluticasone , morphine injection, nitroGLYCERIN , ondansetron  (ZOFRAN ) IV, polyethylene glycol  Allergies:    Allergies  Allergen Reactions   Sulfamethoxazole-Trimethoprim Rash and Other (See Comments)    Sweet's syndrome (Sweet's syndrome is an uncommon skin condition marked by a distinctive eruption of tiny bumps that enlarge and are often tender to the touch.)   Aripiprazole Other (See Comments)    SI    Crestor  [Rosuvastatin  Calcium ] Other (See Comments)    Myalgias   Duloxetine Other (See Comments)    SI    Gabapentin Other (See Comments)    Muscle pain    Hydromorphone Hcl Other (See Comments)    Left side went numb Per family pt is allergic   Imdur  [Isosorbide  Nitrate]  Other (See Comments)    Dizziness   Lorazepam Other (See Comments)    Hallucinations    Olanzapine Other (See Comments)    SI    Pseudoephedrine Other (See Comments)    Caused a seizure   Risperidone Other (See Comments)   Terfenadine Other (See Comments)    Reaction not known   Atorvastatin  Other (See Comments)    Muscle pain   Biaxin [Clarithromycin] Rash    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 (Sweet's syndrome is an uncommon skin condition marked by a distinctive eruption of tiny bumps that enlarge and are often tender to the touch.)    Social History:   Social History   Socioeconomic History   Marital status: Married    Spouse name: Not on file   Number of children: 4   Years of education: 12   Highest education level: Not on file  Occupational History   Occupation: Disabled  Tobacco Use   Smoking status: Never   Smokeless tobacco: Former    Types: Chew    Quit date: 09/03/2005  Vaping Use   Vaping status: Never Used  Substance and Sexual Activity   Alcohol use: No    Alcohol/week: 0.0 standard drinks of alcohol   Drug use: No   Sexual activity: Not on file  Other Topics Concern   Not on file  Social History Narrative   Lives at home with wife.   Right-handed.   One soda and tea daily.   Social Drivers of Corporate Investment Banker Strain: Not on file  Food Insecurity: No Food Insecurity (05/13/2024)   Hunger Vital Sign    Worried About Running Out of Food in the Last Year: Never true    Ran Out of Food in the Last Year: Never true  Transportation Needs: No Transportation Needs (05/13/2024)   PRAPARE - Administrator, Civil Service (Medical): No    Lack of Transportation (Non-Medical): No  Physical Activity: Not on file  Stress: Not on file  Social Connections: Not on file  Intimate Partner Violence: Not on file    Family History:    Family History  Problem Relation Age of Onset   Heart attack Mother    Diabetes Mother     Hydrocephalus Mother  Heart attack Father    Cancer Father        unsure of type   Diabetes Sister    Coronary artery disease Neg Hx        Siblings     ROS:  Please see the history of present illness.   All other ROS reviewed and negative.     Physical Exam/Data: Vitals:   05/13/24 0730 05/13/24 0745 05/13/24 0800 05/13/24 0921  BP: (!) 154/77 (!) 156/84 (!) 155/80 (!) 147/76  Pulse: 87 75 84 81  Resp: (!) 31 (!) 23 (!) 27 18  Temp:    98.4 F (36.9 C)  TempSrc:    Oral  SpO2: 94% 95% 94% 96%  Weight:    107.5 kg  Height:        Intake/Output Summary (Last 24 hours) at 05/13/2024 1316 Last data filed at 05/12/2024 2119 Gross per 24 hour  Intake 1000 ml  Output --  Net 1000 ml      05/13/2024    9:21 AM 05/12/2024    5:54 PM 12/31/2023   12:51 PM  Last 3 Weights  Weight (lbs) 236 lb 15.9 oz 235 lb 0.2 oz 235 lb  Weight (kg) 107.5 kg 106.6 kg 106.595 kg     Body mass index is 30.43 kg/m.  General:  Well nourished, well developed, in no acute distress. Laying in the bed with head elevated  HEENT: normal Neck: no JVD Vascular: Radial pulses 2+ bilaterally Cardiac:  normal S1, S2; RRR; no murmur   Lungs:  clear to auscultation bilaterally, no wheezing, rhonchi or rales. Normal WOB on room air   Abd: soft, nontender  Ext: no edema Skin: warm and dry  Neuro:  CNs 2-12 intact, no focal abnormalities noted Psych:  Normal affect   EKG:  The EKG was personally reviewed and demonstrates:  NSR,  Telemetry:  Telemetry was personally reviewed and demonstrates:  NSR with occasional PVCs   Relevant CV Studies: Cardiac Studies & Procedures   ______________________________________________________________________________________________ CARDIAC CATHETERIZATION  CARDIAC CATHETERIZATION 11/06/2021  Conclusion Successful stenting of severe distal left main and ostial LAD stenoses using a 4.0 x 16 mm Synergy DES using intravascular ultrasound guidance  Recommend:  Same-day PCI protocol as long as no complications arise.  Continue dual antiplatelet therapy with aspirin  and clopidogrel  indefinitely.  Findings Coronary Findings Diagnostic  Dominance: Left  Left Main Mid LM to Ost LAD lesion is 75% stenosed.  Left Anterior Descending Non-stenotic Ost LAD to Prox LAD lesion was previously treated. The lesion is eccentric. The lesion is calcified. Prox LAD to Mid LAD lesion is 50% stenosed. The lesion was previously treated using a drug eluting stent between 1-2 years ago. There is 50-60% in-stent restenosis in the proximal aspect of the stented segment.  There is another area of more focal in-stent restenosis is estimated at 50% in the distal portion of the stent. Mid LAD lesion is 75% stenosed.  Left Circumflex Non-stenotic Ost Cx lesion was previously treated. There is severe 90% ostial stenosis of a large, dominant circumflex.  This lesion is new from the previous study.  First Obtuse Marginal Branch Ost 1st Mrg to 1st Mrg lesion is 99% stenosed. Chronic subtotal occlusion unchanged from the previous study.  Branch fills late.  Right Coronary Artery Ost RCA to Prox RCA lesion is 75% stenosed. Ostial stenosis of a moderate caliber nondominant RCA  Intervention  Mid LM to Ost LAD lesion Stent Lesion crossed with guidewire. Pre-stent angioplasty was performed  using a BALLN EUPHORA RX 3.5X12. A drug-eluting stent was successfully placed using a SYNERGY XD 4.0X16. Post-stent angioplasty was performed using a BALLN Makena EMERGE MR 4.5X12. Maximum pressure:  18 atm. Post-Intervention Lesion Assessment The intervention was successful. Pre-interventional TIMI flow is 3. Post-intervention TIMI flow is 3. No complications occurred at this lesion. Ultrasound (IVUS) was performed on the lesion post PCI using a CATH OPTICROSS HD. Stent well apposed. Severe plaque burden detected. Lesion characteristics:  calcified and concentric. An additional ultrasound (IVUS) was  performed on the lesion post PCI. There is a 0% residual stenosis post intervention.   CARDIAC CATHETERIZATION  CARDIAC CATHETERIZATION 10/21/2021  Conclusion Conclusions: Significant stenoses of up to 75% involving distal LMCA, ostial LAD and mid LAD (beyond previously stented segment). Chronic subtotal occlusion of OM1. Widely patent stent in the ostial/proximal LCx, which extends back into the distal LMCA. Low normal left ventricular contraction with subtle apical hypokinesis (LVEF 50-55%). Mildly elevated left ventricular filling pressure (LVEDP 21 mmHg).  Recommendations: Images reviewed with Dr. Wonda and case discussed with [name] Cummings and his wife.  His recurrent dyspnea and chest pain are most likely related to recurrent CAD involving the LAD and now extending into the distal LMCA.  We have all agreed that he is not a good candidate for CABG given his remote VF arrest with continued memory deficits from anoxic brain injury.  We will plan for PCI to unprotected LMCA and LAD next week. Obtain echocardiogram to better assess LVEF and exclude underlying valvular abnormalities that could affect high-risk PCI. Continue indefinite DAPT with aspirin  and clopidogrel . Aggressive secondary prevention of coronary artery disease.  Lonni Hanson, MD [facility]  Findings Coronary Findings Diagnostic  Dominance: Left  Left Main Mid LM to Ost LAD lesion is 75% stenosed.  Left Anterior Descending Non-stenotic Ost LAD to Prox LAD lesion was previously treated. The lesion is eccentric. The lesion is calcified. Prox LAD to Mid LAD lesion is 50% stenosed. The lesion was previously treated using a drug eluting stent between 1-2 years ago. There is 50-60% in-stent restenosis in the proximal aspect of the stented segment.  There is another area of more focal in-stent restenosis is estimated at 50% in the distal portion of the stent. Mid LAD lesion is 75% stenosed.  Left  Circumflex Non-stenotic Ost Cx lesion was previously treated. There is severe 90% ostial stenosis of a large, dominant circumflex.  This lesion is new from the previous study.  First Obtuse Marginal Branch Ost 1st Mrg to 1st Mrg lesion is 99% stenosed. Chronic subtotal occlusion unchanged from the previous study.  Branch fills late.  Right Coronary Artery Ost RCA to Prox RCA lesion is 75% stenosed. Ostial stenosis of a moderate caliber nondominant RCA  Intervention  No interventions have been documented.   STRESS TESTS  NM MYOCAR MULTI W/SPECT W 06/22/2023  Narrative   Findings are consistent with infarction with peri-infarct ischemia. The study is intermediate risk.   No ST deviation was noted. The ECG was negative for ischemia.   LV perfusion is abnormal.  Large sized, mild to moderate intensity, apical to basal lateral defect that exhibits partial reversibility.  Apical portion of the defect is largely fixed.  Suggestive of scar with moderate peri-infarct ischemia.   Left ventricular function is abnormal. Nuclear stress EF: 48%.  Overall intermediate risk study with lateral scar and moderate peri-infarct ischemia, LVEF 48%.   ECHOCARDIOGRAM  ECHOCARDIOGRAM COMPLETE 10/21/2021  Narrative ECHOCARDIOGRAM REPORT    Patient Name:  REEVE TURNLEY Date of Exam: 10/21/2021 Medical Rec #:  [id number]      Height:       73.0 in Accession #:    [id number]     Weight:       243.0 lb Date of Birth:  1960/10/25       BSA:          2.337 m Patient Age:    60 years       BP:           122/70 mmHg Patient Gender: M              HR:           52 bpm. Exam Location:  Inpatient  Procedure: 2D Echo, Color Doppler and Cardiac Doppler  Indications:    chest pain  History:        Patient has prior history of Echocardiogram examinations, most recent 08/13/2018. Cardiomegaly and CHF; Risk Factors:Hypertension and Dyslipidemia.  Sonographer:    Emmie Dew Referring Phys: [phone] CHRISTOPHER  END  IMPRESSIONS   1. There is sluggish blood flow by definity  contrast in the apex. There is no obvious LV apical thrombus but increased risk for thrombus formation. Left ventricular ejection fraction, by estimation, is 55 to 60%. The left ventricle has normal function. The left ventricle demonstrates regional wall motion abnormalities (see scoring diagram/findings for description). There is mild concentric left ventricular hypertrophy. Left ventricular diastolic parameters are indeterminate. There is akinesis of the left ventricular, apical segment. 2. Right ventricular systolic function is normal. The right ventricular size is normal. There is normal pulmonary artery systolic pressure. The estimated right ventricular systolic pressure is 7.0 mmHg. 3. The mitral valve is normal in structure. No evidence of mitral valve regurgitation. No evidence of mitral stenosis. 4. The aortic valve is normal in structure. Aortic valve regurgitation is not visualized. No aortic stenosis is present. 5. The inferior vena cava is normal in size with greater than 50% respiratory variability, suggesting right atrial pressure of 3 mmHg.  FINDINGS Left Ventricle: There is sluggish blood flow by definity  contrast in the apex. There is no obvious LV apical thrombus but increased risk for thrombus formation. Left ventricular ejection fraction, by estimation, is 55 to 60%. The left ventricle has normal function. The left ventricle demonstrates regional wall motion abnormalities. Definity  contrast agent was given IV to delineate the left ventricular endocardial borders. The left ventricular internal cavity size was normal in size. There is mild concentric left ventricular hypertrophy. Left ventricular diastolic parameters are indeterminate. Normal left ventricular filling pressure.  Right Ventricle: The right ventricular size is normal. No increase in right ventricular wall thickness. Right ventricular systolic function is  normal. There is normal pulmonary artery systolic pressure. The tricuspid regurgitant velocity is 1.00 m/s, and with an assumed right atrial pressure of 3 mmHg, the estimated right ventricular systolic pressure is 7.0 mmHg.  Left Atrium: Left atrial size was normal in size.  Right Atrium: Right atrial size was normal in size.  Pericardium: There is no evidence of pericardial effusion.  Mitral Valve: The mitral valve is normal in structure. No evidence of mitral valve regurgitation. No evidence of mitral valve stenosis. MV peak gradient, 2.8 mmHg. The mean mitral valve gradient is 1.0 mmHg.  Tricuspid Valve: The tricuspid valve is normal in structure. Tricuspid valve regurgitation is trivial. No evidence of tricuspid stenosis.  Aortic Valve: The aortic valve is normal in structure. Aortic valve regurgitation is not visualized.  No aortic stenosis is present. Aortic valve mean gradient measures 3.0 mmHg. Aortic valve peak gradient measures 6.2 mmHg.  Pulmonic Valve: The pulmonic valve was normal in structure. Pulmonic valve regurgitation is not visualized. No evidence of pulmonic stenosis.  Aorta: The aortic root is normal in size and structure.  Venous: The inferior vena cava is normal in size with greater than 50% respiratory variability, suggesting right atrial pressure of 3 mmHg.  IAS/Shunts: No atrial level shunt detected by color flow Doppler.   LEFT VENTRICLE PLAX 2D LVIDd:         3.70 cm Diastology LVIDs:         2.00 cm LV e' medial:    7.29 cm/s LV PW:         1.30 cm LV E/e' medial:  10.1 LV IVS:        1.40 cm LV e' lateral:   7.94 cm/s LV E/e' lateral: 9.3   RIGHT VENTRICLE RV Basal diam:  3.90 cm RV Mid diam:    3.00 cm RV S prime:     12.40 cm/s TAPSE (M-mode): 3.0 cm  LEFT ATRIUM             Index        RIGHT ATRIUM           Index LA diam:        3.80 cm 1.63 cm/m   RA Area:     23.40 cm LA Vol (A2C):   53.0 ml 22.68 ml/m  RA Volume:   72.00 ml  30.80  ml/m LA Vol (A4C):   60.6 ml 25.93 ml/m LA Biplane Vol: 57.7 ml 24.69 ml/m AORTIC VALVE                   PULMONIC VALVE AV Vmax:           125.00 cm/s PV Vmax:       1.24 m/s AV Vmean:          74.900 cm/s PV Vmean:      81.600 cm/s AV VTI:            0.303 m     PV VTI:        0.300 m AV Peak Grad:      6.2 mmHg    PV Peak grad:  6.2 mmHg AV Mean Grad:      3.0 mmHg    PV Mean grad:  3.0 mmHg LVOT Vmax:         97.50 cm/s LVOT Vmean:        60.000 cm/s LVOT VTI:          0.239 m LVOT/AV VTI ratio: 0.79  AORTA Ao Asc diam: 3.10 cm  MITRAL VALVE               TRICUSPID VALVE MV Area (PHT): 2.80 cm    TR Peak grad:   4.0 mmHg MV Peak grad:  2.8 mmHg    TR Vmax:        99.50 cm/s MV Mean grad:  1.0 mmHg MV Vmax:       0.84 m/s    SHUNTS MV Vmean:      46.6 cm/s   Systemic VTI: 0.24 m MV Decel Time: 271 msec MV E velocity: 73.70 cm/s MV A velocity: 63.40 cm/s MV E/A ratio:  1.16  [name] Bihari MD Electronically signed by [name] Bihari MD Signature Date/Time: 10/21/2021/12:10:10 PM    Final  ______________________________________________________________________________________________       Laboratory Data: High Sensitivity Troponin:  No results for input(s): TROPONINIHS in the last 720 hours.   Chemistry Recent Labs  Lab 05/12/24 1800  NA 139  K 4.4  CL 103  CO2 26  GLUCOSE 105*  Cummings 19  CREATININE 1.03  CALCIUM  9.2  GFRNONAA >60  ANIONGAP 10    Recent Labs  Lab 05/12/24 1800  PROT 6.8  ALBUMIN 4.5  AST 24  ALT 19  ALKPHOS 136*  BILITOT 0.6   Lipids No results for input(s): CHOL, TRIG, HDL, LABVLDL, LDLCALC, CHOLHDL in the last 168 hours.  Hematology Recent Labs  Lab 05/12/24 1800  WBC 7.9  RBC 4.75  HGB 14.0  HCT 41.2  MCV 86.7  MCH 29.5  MCHC 34.0  RDW 11.8  PLT 240   Thyroid  No results for input(s): TSH, FREET4 in the last 168 hours.  BNPNo results for input(s): BNP, PROBNP in the last 168 hours.   DDimer No results for input(s): DDIMER in the last 168 hours.  Radiology/Studies:  CT Cervical Spine Wo Contrast Result Date: 05/12/2024 EXAM: CT CERVICAL SPINE WITHOUT CONTRAST 05/12/2024 07:24:00 PM TECHNIQUE: CT of the cervical spine was performed without the administration of intravenous contrast. Multiplanar reformatted images are provided for review. Automated exposure control, iterative reconstruction, and/or weight based adjustment of the mA/kV was utilized to reduce the radiation dose to as low as reasonably achievable. COMPARISON: 10/23/2020 CLINICAL HISTORY: Recent fall with neck pain. FINDINGS: CERVICAL SPINE: BONES AND ALIGNMENT: Loss of the normal cervical lordosis is noted. The odontoid is within normal limits. No acute fracture or acute facet abnormality is seen. DEGENERATIVE CHANGES: Multilevel osteophytic changes are seen. Multilevel facet hypertrophic changes are noted as well. SOFT TISSUES: Surrounding soft tissue structures are unremarkable. Visualized lung apices are within normal limits. IMPRESSION: 1. No acute fracture or acute facet abnormality. 2. Loss of the normal cervical lordosis. 3. Multilevel osteophytic and facet hypertrophic changes. Electronically signed by: [name] Devonshire MD 05/12/2024 07:30 PM EST RP Workstation: [id number]   CT Head Wo Contrast Result Date: 05/12/2024 EXAM: CT HEAD WITHOUT CONTRAST 05/12/2024 07:24:00 PM TECHNIQUE: CT of the head was performed without the administration of intravenous contrast. Automated exposure control, iterative reconstruction, and/or weight based adjustment of the mA/kV was utilized to reduce the radiation dose to as low as reasonably achievable. COMPARISON: 4 x 26 x 22 CLINICAL HISTORY: Head trauma, abnormal mental status (Age 82-64y). FINDINGS: BRAIN AND VENTRICLES: No acute hemorrhage. No evidence of acute infarct. Cerebral atrophy. Ventriculomegaly out of proportion to sulcal enlargement. ORBITS: No acute abnormality. SINUSES:  No acute abnormality. SOFT TISSUES AND SKULL: No acute soft tissue abnormality. No skull fracture. IMPRESSION: 1. No acute intracranial abnormality. 2. Stable Ventriculomegaly out of proportion to sulcal enlargement. Electronically signed by: [name] Devonshire MD 05/12/2024 07:28 PM EST RP Workstation: [id number]   DG Chest 1 View Result Date: 05/12/2024 CLINICAL DATA:  Fall with hip fracture EXAM: CHEST  1 VIEW COMPARISON:  10/23/2020 FINDINGS: Cardiomegaly. No acute airspace disease or effusion. No pneumothorax. Age indeterminate left fifth sixth and seventh lateral rib fractures. IMPRESSION: No active disease. Cardiomegaly. Age indeterminate left fifth through seventh lateral rib fractures. Electronically Signed   By: [name] Cummings M.D.   On: 05/12/2024 18:37   DG Hip Unilat W or Wo Pelvis 2-3 Views Left Result Date: 05/12/2024 CLINICAL DATA:  Fall pain EXAM: DG HIP (WITH OR WITHOUT PELVIS) 2-3V LEFT COMPARISON:  10/19/2023 FINDINGS: Acute mildly displaced left  femoral neck fracture. No femoral head dislocation. Pubic symphysis and rami are intact. Moderate right hip degenerative change IMPRESSION: Acute mildly displaced left femoral neck fracture. Electronically Signed   By: [name] Cummings M.D.   On: 05/12/2024 18:36     Assessment and Plan:  Preop Evaluation  CAD  History of VF arrest (2007)  - Patient had VF arrest in 2007 in the setting of in-stent thrombosis. Has had multiple stents placed since. (DES to dist LAD in 2010, DES to the LAD in 2017 for ISR, DES to LCX in 2018, DES to LAD in 2020, DES to distal left main and ostial LAD in 10/2021)  - Echo in 09/2021 showed EF 55-60% with RWMAs, sluggish flow in LV apex without evidence of thrombus, normal RV size and function, no significant valvular abnormalities  - Stress test in 05/2023 showed findings consistent with infarction and peri-infarct ischemia. Managed medically  - Patient now admitted with L hip fracture. Pending total hip replacement  -  Patient has history of anoxic brain injury so history limited. Wife at bedside tells me that patient has not been having chest pain or SOB. He is able to walk around his home without assistance  - Patient euvolemic on exam. No evidence of acute heart failure  - Patient is high risk for surgery due to his past cardiac history. Discussed this with wife at bedside who voiced understanding.  - Unclear if further cardiac testing would decrease risk. He seems to be asymptomatic from a cardiac perspective. This has been deemed a time-sensitive procedure by orthopedics. No plans for further workup prior to surgery. Dr. Santo to see and finalize recommendations  - Recommend a conservative anesthesia strategy  - Continue ASA 81 mg daily. Resume Plavix  as soon as safe after surgery. Patient is on long term DAPT due to left main stenting   Risk Assessment/Risk Scores:  For questions or updates, please contact [facility] Please consult [email or web address] for contact info under    Signed, Rollo FABIENE Louder, PA-C  05/13/2024 1:16 PM   I have personally evaluated and examined the patient. The history, physical exam, and medical decision making documented below were performed independently and substantively by me. I have reviewed all relevant data, formulated the assessment and plan, and assumed responsibility for the management of this patient. My documentation reflects the substantive portion of the split/shared visit, in accordance with CMS and CPT guidelines. I have collaborated with KJ prior to the generation of this note.  I have personally performed the substantive portion of the medical decision making, including interpretation of diagnostic data, formulation of the management plan, and assessment of risks. In summation:  [name] Cummings is a 63 year old with complex coronary artery disease who presents for preoperative risk stratification. He is accompanied by his wife, daughter (virtually),  and granddaughter.  He has a history of complex coronary artery disease with multiple interventions, including bare-metal stent placement in the left anterior descending artery in 2007, followed by stent thrombosis and ventricular fibrillation requiring resuscitation. The left anterior descending artery was re-intervened upon with drug-eluting stents in 2010 due to in-stent restenosis. He has also undergone interventions in the circumflex artery, osteal right coronary artery, and left main coronary artery. His right coronary artery is noted to be a small caliber vessel.  His last stress test showed an intermediate result with a large infarct and mild peri-infarct ischemia. Since then, he has been asymptomatic, remaining active without experiencing chest pain, chest pressure, or  heart failure symptoms. He does not engage in specific exercises but is able to walk regularly.  An echocardiogram from 2023 reported normal cardiac function with decreased flow in the apex but no evidence of left ventricular apical thrombus. An EKG showed evidence of a prior inferolateral infarct and sinus rhythm, while telemetry indicated sinus rhythm with isolated premature ventricular contractions.  He is an active individual supported significantly by his wife. No chest pain, chest pressure, or heart failure symptoms.  Exam notable for  Gen: no distress   Neck: No JVD Cardiac: No Rubs or Gallops, no Murmur RRR and +2 Radial  Respiratory: Clear to auscultation bilaterally, normal effort, normal  respiratory rate GI: Soft, nontender, non-distended  MS: No  edema;  moves all extremities Integument: Skin feels warm Neuro:  At time of evaluation, alert and oriented to person only; short term memory loss is appropriate Psych: Normal affect, patient feels ok  Personally reviewed data and interpretation:   Additional tests: Echocardiogram: Normal function with decreased flow in the apex, no LV apical thrombus (2023) EKG:  Evidence of prior inferolateral infarct and sinus rhythm Telemetry: Sinus rhythm with isolated premature ventricular contractions (PVCs) Nuclear medicine ejection fraction: 48%   In assessment and plan:   Preoperative cardiac risk assessment in patient with complex coronary artery disease and prior interventions High cardiac risk due to complex multivessel disease and reduced ejection fraction of 48% (By SPECT- may be underestimated). No clear targets for intervention on last heart catheterization. Well compensated without angina, heart failure, or significant arrhythmias. Surgery is time sensitive, and cardiac conditions appear well managed. Family agrees with proceeding with surgery despite high risk. - Continue carvedilol  - Temporarily reduce dual antiplatelet therapy to aspirin  for urgent surgery - Continue pravastatin  and Repatha  - Consider IV Lasix  postoperatively if needed  History of myocardial infarction Prior inferolateral infarct noted on EKG. No current symptoms of angina or heart failure.  Reduced left ventricular ejection fraction Ejection fraction of 48% noted on nuclear medicine stress test. Appears well compensated without symptoms of heart failure, if persistent   Status post hip fracture Recent hip fracture following a fall. Surgery is time sensitive and scheduled for 3 PM. - Proceed with surgery using the most conservative anesthesia plan  Sinus rhythm with isolated premature ventricular contractions Telemetry shows sinus rhythm with isolated PVCs. No significant arrhythmias noted. - Continue current management with BB   If hypoxic issues post op, will need echocardiogram  Stanly Leavens, MD FASE [facility] Cardiologist [facility]  [address] [place], [address] [place], [place] [address] [phone]  2:35 PM

## 2024-05-14 DIAGNOSIS — I25118 Atherosclerotic heart disease of native coronary artery with other forms of angina pectoris: Secondary | ICD-10-CM | POA: Diagnosis not present

## 2024-05-14 DIAGNOSIS — S72002A Fracture of unspecified part of neck of left femur, initial encounter for closed fracture: Secondary | ICD-10-CM | POA: Diagnosis not present

## 2024-05-14 DIAGNOSIS — I255 Ischemic cardiomyopathy: Secondary | ICD-10-CM | POA: Diagnosis not present

## 2024-05-14 LAB — CBC
HCT: 33.5 % — ABNORMAL LOW (ref 39.0–52.0)
Hemoglobin: 11.6 g/dL — ABNORMAL LOW (ref 13.0–17.0)
MCH: 29.9 pg (ref 26.0–34.0)
MCHC: 34.6 g/dL (ref 30.0–36.0)
MCV: 86.3 fL (ref 80.0–100.0)
Platelets: 194 K/uL (ref 150–400)
RBC: 3.88 MIL/uL — ABNORMAL LOW (ref 4.22–5.81)
RDW: 11.8 % (ref 11.5–15.5)
WBC: 16.5 K/uL — ABNORMAL HIGH (ref 4.0–10.5)
nRBC: 0 % (ref 0.0–0.2)

## 2024-05-14 LAB — BASIC METABOLIC PANEL WITH GFR
Anion gap: 11 (ref 5–15)
Anion gap: 9 (ref 5–15)
BUN: 21 mg/dL (ref 8–23)
BUN: 23 mg/dL (ref 8–23)
CO2: 26 mmol/L (ref 22–32)
CO2: 25 mmol/L (ref 22–32)
Calcium: 8.6 mg/dL — ABNORMAL LOW (ref 8.9–10.3)
Calcium: 8.3 mg/dL — ABNORMAL LOW (ref 8.9–10.3)
Chloride: 98 mmol/L (ref 98–111)
Chloride: 99 mmol/L (ref 98–111)
Creatinine, Ser: 1.19 mg/dL (ref 0.61–1.24)
Creatinine, Ser: 1.12 mg/dL (ref 0.61–1.24)
GFR, Estimated: >60 mL/min (ref >60)
GFR, Estimated: >60 mL/min (ref >60)
Glucose, Bld: 170 mg/dL — ABNORMAL HIGH (ref 70–99)
Glucose, Bld: 184 mg/dL — ABNORMAL HIGH (ref 70–99)
Potassium: 4.5 mmol/L (ref 3.5–5.1)
Potassium: 4.1 mmol/L (ref 3.5–5.1)
Sodium: 135 mmol/L (ref 135–145)
Sodium: 133 mmol/L — ABNORMAL LOW (ref 135–145)

## 2024-05-14 MED ORDER — CLOPIDOGREL BISULFATE 75 MG PO TABS
75.0000 mg | ORAL_TABLET | Freq: Every day | ORAL | Status: DC
  Administered 2024-05-14 – 2024-05-18 (×5): 75 mg via ORAL
  Filled 2024-05-14 (×5): qty 1

## 2024-05-14 MED ORDER — METHOCARBAMOL 1000 MG/10ML IJ SOLN
500.0000 mg | Freq: Four times a day (QID) | INTRAMUSCULAR | Status: DC | PRN
Start: 2024-05-14 — End: 2024-05-18

## 2024-05-14 MED ORDER — DOCUSATE SODIUM 100 MG PO CAPS
100.0000 mg | ORAL_CAPSULE | Freq: Two times a day (BID) | ORAL | Status: DC
  Administered 2024-05-14 – 2024-05-18 (×9): 100 mg via ORAL
  Filled 2024-05-14 (×9): qty 1

## 2024-05-14 MED ORDER — METOCLOPRAMIDE HCL 5 MG PO TABS: 5.0000 mg | ORAL_TABLET | Freq: Three times a day (TID) | ORAL | Status: DC | PRN

## 2024-05-14 MED ORDER — METHOCARBAMOL 500 MG PO TABS
500.0000 mg | ORAL_TABLET | Freq: Four times a day (QID) | ORAL | Status: DC | PRN
Start: 2024-05-14 — End: 2024-05-18

## 2024-05-14 MED ORDER — HYDROCODONE-ACETAMINOPHEN 5-325 MG PO TABS
1.0000 | ORAL_TABLET | ORAL | Status: DC | PRN
  Administered 2024-05-14 (×2): 1 via ORAL
  Administered 2024-05-15 – 2024-05-18 (×2): 2 via ORAL
  Filled 2024-05-14 (×2): qty 1
  Filled 2024-05-14 (×2): qty 2

## 2024-05-14 MED ORDER — ONDANSETRON HCL 4 MG/2ML IJ SOLN: 4.0000 mg | Freq: Four times a day (QID) | INTRAMUSCULAR | Status: DC | PRN

## 2024-05-14 MED ORDER — ONDANSETRON HCL 4 MG PO TABS: 4.0000 mg | ORAL_TABLET | Freq: Four times a day (QID) | ORAL | Status: DC | PRN

## 2024-05-14 MED ORDER — ACETAMINOPHEN 325 MG PO TABS
325.0000 mg | ORAL_TABLET | Freq: Four times a day (QID) | ORAL | Status: DC | PRN
  Administered 2024-05-14: 500 mg via ORAL

## 2024-05-14 MED ORDER — CHLORPROMAZINE HCL 25 MG PO TABS
25.0000 mg | ORAL_TABLET | Freq: Three times a day (TID) | ORAL | Status: DC | PRN
Start: 2024-05-14 — End: 2024-05-18
  Administered 2024-05-14 – 2024-05-15 (×2): 25 mg via ORAL
  Filled 2024-05-14 (×3): qty 1

## 2024-05-14 MED ORDER — PHENOL 1.4 % MT LIQD: 1.0000 | OROMUCOSAL | Status: DC | PRN

## 2024-05-14 MED ORDER — SENNA 8.6 MG PO TABS
1.0000 | ORAL_TABLET | Freq: Two times a day (BID) | ORAL | Status: DC
  Administered 2024-05-14 – 2024-05-18 (×9): 8.6 mg via ORAL
  Filled 2024-05-14 (×9): qty 1

## 2024-05-14 MED ORDER — ACETAMINOPHEN 500 MG PO TABS
500.0000 mg | ORAL_TABLET | Freq: Four times a day (QID) | ORAL | Status: AC
  Administered 2024-05-14 (×2): 500 mg via ORAL
  Filled 2024-05-14 (×5): qty 1

## 2024-05-14 MED ORDER — ALUM & MAG HYDROXIDE-SIMETH 200-200-20 MG/5ML PO SUSP: 30.0000 mL | ORAL | Status: DC | PRN

## 2024-05-14 MED ORDER — DIPHENHYDRAMINE HCL 12.5 MG/5ML PO ELIX: 12.5000 mg | ORAL_SOLUTION | ORAL | Status: DC | PRN

## 2024-05-14 MED ORDER — CEFAZOLIN SODIUM-DEXTROSE 2-4 GM/100ML-% IV SOLN
2.0000 g | Freq: Four times a day (QID) | INTRAVENOUS | Status: AC
  Administered 2024-05-14 (×2): 2 g via INTRAVENOUS
  Filled 2024-05-14 (×2): qty 100

## 2024-05-14 MED ORDER — ASPIRIN 81 MG PO CHEW
81.0000 mg | CHEWABLE_TABLET | Freq: Two times a day (BID) | ORAL | Status: DC
  Administered 2024-05-14 – 2024-05-18 (×9): 81 mg via ORAL
  Filled 2024-05-14 (×9): qty 1

## 2024-05-14 MED ORDER — POLYETHYLENE GLYCOL 3350 17 G PO PACK
17.0000 g | PACK | Freq: Every day | ORAL | Status: DC | PRN
  Administered 2024-05-15 – 2024-05-18 (×3): 17 g via ORAL
  Filled 2024-05-14 (×3): qty 1

## 2024-05-14 MED ORDER — SODIUM CHLORIDE 0.9 % IV SOLN: INTRAVENOUS | Status: DC

## 2024-05-14 MED ORDER — METOCLOPRAMIDE HCL 5 MG/ML IJ SOLN: 5.0000 mg | Freq: Three times a day (TID) | INTRAMUSCULAR | Status: DC | PRN

## 2024-05-14 MED ORDER — MENTHOL 3 MG MT LOZG: 1.0000 | LOZENGE | OROMUCOSAL | Status: DC | PRN

## 2024-05-14 MED ORDER — MORPHINE SULFATE (PF) 2 MG/ML IV SOLN: 0.5000 mg | INTRAVENOUS | Status: DC | PRN

## 2024-05-14 MED ORDER — HYDROCODONE-ACETAMINOPHEN 7.5-325 MG PO TABS
1.0000 | ORAL_TABLET | ORAL | Status: DC | PRN
  Administered 2024-05-14 – 2024-05-15 (×4): 2 via ORAL
  Administered 2024-05-16: 1 via ORAL
  Administered 2024-05-16: 2 via ORAL
  Administered 2024-05-16 (×2): 1 via ORAL
  Administered 2024-05-17 (×2): 2 via ORAL
  Administered 2024-05-17: 1 via ORAL
  Administered 2024-05-17 – 2024-05-18 (×4): 2 via ORAL
  Filled 2024-05-14 (×9): qty 2
  Filled 2024-05-14: qty 1
  Filled 2024-05-14 (×5): qty 2

## 2024-05-14 NOTE — Progress Notes (Signed)
 Subjective: 1 Day Post-Op Procedure(s) (LRB): ARTHROPLASTY, HIP, TOTAL, ANTERIOR APPROACH (Left) Patient reports pain as mild.  Tolerating regular diet.  Daughter at bedside.  OOB to chair with PT.  Objective: Vital signs in last 24 hours: Temp:  [96.1 F (35.6 C)-99.5 F (37.5 C)] 98.3 F (36.8 C) (11/15 0802) Pulse Rate:  [74-94] 92 (11/15 0802) Resp:  [12-22] 22 (11/15 0802) BP: (110-163)/(68-96) 144/74 (11/15 0802) SpO2:  [89 %-98 %] 93 % (11/15 0802)  Intake/Output from previous day: 11/14 0701 - 11/15 0700 In: 1250 [I.V.:1000; IV Piggyback:250] Out: 50 [Blood:50] Intake/Output this shift: No intake/output data recorded.  Recent Labs    05/12/24 1800 05/14/24 0402  HGB 14.0 11.6*   Recent Labs    05/12/24 1800 05/14/24 0402  WBC 7.9 16.5*  RBC 4.75 3.88*  HCT 41.2 33.5*  PLT 240 194   Recent Labs    05/14/24 0402 05/14/24 0719  NA 135 133*  K 4.5 4.1  CL 98 99  CO2 26 25  BUN 21 23  CREATININE 1.19 1.12  GLUCOSE 170* 184*  CALCIUM  8.6* 8.3*   Recent Labs    05/12/24 1800  INR 1.0    PE:  wn wd male in nad.  Incision dressed and dry.  Active PF and DF at the ankle.  Palpable DP pulse.   Assessment/Plan: 1 Day Post-Op Procedure(s) (LRB): ARTHROPLASTY, HIP, TOTAL, ANTERIOR APPROACH (Left) Up with therapy Wbat L LE.     Norleen Armor 05/14/2024, 10:28 AM

## 2024-05-14 NOTE — Progress Notes (Signed)
 Progress Note  Patient Name: [name] Cummings Date of Encounter: 05/14/2024  Primary Cardiologist: Alvan Carrier, MD   Subjective   Feels fine. No chest pain. Breathing easily.  Inpatient Medications    Scheduled Meds:  acetaminophen   500 mg Oral Q6H   aspirin   81 mg Oral BID   brexpiprazole  1 mg Oral QHS   busPIRone  15 mg Oral BID   carvedilol   25 mg Oral BID WC   clopidogrel   75 mg Oral Daily   docusate sodium  100 mg Oral BID   feeding supplement  237 mL Oral BID BM   FLUoxetine   20 mg Oral TID   loratadine   10 mg Oral q morning   oxybutynin  10 mg Oral BID   pantoprazole   40 mg Oral QPM   pravastatin   80 mg Oral QPM   senna  1 tablet Oral BID   Continuous Infusions:  sodium chloride  150 mL/hr at 05/14/24 0631    ceFAZolin (ANCEF) IV 2 g (05/14/24 0633)   PRN Meds: acetaminophen , albuterol, alum & mag hydroxide-simeth, diphenhydrAMINE, fluticasone , HYDROcodone -acetaminophen , HYDROcodone -acetaminophen , menthol **OR** phenol, methocarbamol **OR** methocarbamol (ROBAXIN) injection, metoCLOPramide **OR** metoCLOPramide (REGLAN) injection, morphine injection, nitroGLYCERIN , ondansetron  **OR** ondansetron  (ZOFRAN ) IV, polyethylene glycol   Vital Signs    Vitals:   05/13/24 2103 05/14/24 0441 05/14/24 0500 05/14/24 0802  BP: 121/72 127/80  (!) 144/74  Pulse: 75 94  92  Resp: 18 18  (!) 22  Temp: (!) 97.1 F (36.2 C) (!) 96.1 F (35.6 C) (!) 97.2 F (36.2 C) 98.3 F (36.8 C)  TempSrc: Rectal Rectal  Oral  SpO2: 95% 98%  93%  Weight:      Height:        Intake/Output Summary (Last 24 hours) at 05/14/2024 1149 Last data filed at 05/14/2024 1140 Gross per 24 hour  Intake 1250 ml  Output 300 ml  Net 950 ml   Filed Weights   05/12/24 1754 05/13/24 0921  Weight: 106.6 kg 107.5 kg    Telemetry    Sinus rhythm, sinus tachycardia - Personally Reviewed  ECG    No new   Physical Exam   GEN: No acute distress.   Neck: No JVD Cardiac: RRR, no  murmurs, rubs, or gallops.  Respiratory: Clear to auscultation bilaterally. GI: Soft, nontender, non-distended  MS: No edema; No deformity. Neuro:  Nonfocal  Psych: Normal affect   Labs    Chemistry Recent Labs  Lab 05/12/24 1800 05/14/24 0402 05/14/24 0719  NA 139 135 133*  K 4.4 4.5 4.1  CL 103 98 99  CO2 26 26 25   GLUCOSE 105* 170* 184*  BUN 19 21 23   CREATININE 1.03 1.19 1.12  CALCIUM  9.2 8.6* 8.3*  PROT 6.8  --   --   ALBUMIN 4.5  --   --   AST 24  --   --   ALT 19  --   --   ALKPHOS 136*  --   --   BILITOT 0.6  --   --   GFRNONAA >60 >60 >60  ANIONGAP 10 11 9      Hematology Recent Labs  Lab 05/12/24 1800 05/14/24 0402  WBC 7.9 16.5*  RBC 4.75 3.88*  HGB 14.0 11.6*  HCT 41.2 33.5*  MCV 86.7 86.3  MCH 29.5 29.9  MCHC 34.0 34.6  RDW 11.8 11.8  PLT 240 194    Cardiac EnzymesNo results for input(s): TROPONINI in the last 168 hours. No results for  input(s): TROPIPOC in the last 168 hours.   BNPNo results for input(s): BNP, PROBNP in the last 168 hours.   DDimer No results for input(s): DDIMER in the last 168 hours.   Summary of Pertinent studies      Patient Profile     63 y.o. male with a hx of VF arrest in 2007, CAD, HCF, HTN, depression, anxiety, anoxic brain injury with persistent short-term memory loss who is being seen today for a post-op check.  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 Has had multiple stents placed since. (DES to dist LAD in 2010, DES to the LAD in 2017 for ISR, DES to LCX in 2018, DES to LAD in 2020, DES to distal left main and ostial LAD in 10/2021)  - Echo in 09/2021 showed EF 55-60% with RWMAs, sluggish flow in LV apex without evidence of thrombus, normal RV size and function, no significant valvular abnormalities  - Stress test in 05/2023 showed findings consistent with infarction and  peri-infarct ischemia. Managed medically  - Patient now admitted with L hip fracture. Pending total hip replacement  - Patient has history of anoxic brain injury so history limited. Wife at bedside tells me that patient has not been having chest pain or SOB. He is able to walk around his home without assistance  - Patient euvolemic on exam. No evidence of acute heart failure  - Recommend a conservative anesthesia strategy  - Continue ASA 81 mg daily. Resume Plavix  as soon as safe after surgery. Patient is on long term DAPT due to left main stenting  - continue pravastatin  and Repatha  - continue carvedilol   For questions or updates, please contact [facility] Please consult [email or web address] for contact info under Cardiology/STEMI.      Signed, Eulas FORBES Furbish, MD 05/14/2024, 11:49 AM

## 2024-05-14 NOTE — Progress Notes (Signed)
 Progress Note   Patient: [name] Cummings FMW:[id number] DOB: 1960/09/11 DOA: 05/12/2024     2 DOS: the patient was seen and examined on 05/14/2024   Brief hospital course: 63 y.o. male with medical history significant for anoxic brain injury, coronary artery disease, hypertension, ventricular fibrillation, seizures. Patient was brought to the ED after a fall.  History is obtained from chart review and talking to family spouse and daughter at bedside.  Due to patient's anoxic brain injury, he has no short-term memory, and repeats questions multiple times.  Patient was walking outside wearing flip-flops when he fell.  Family thinks he may have tripped on the flip-flops, as it has happened before.  His son and daughter were there but did not witness the fall, but saw him on the floor.  He had otherwise been doing well, he ambulates without assistance or assistive devices.  He has no chest pain, no difficulty breathing, no cough.    ED Course: Temperature 98.9.  Heart rate 65-82.  Respiratory 18-27..  O2 sats initially 90 to 93% dropped to 88 percent about an hour after morphine was given. Pelvic x-ray - acute mildly displaced left femoral neck fracture. Chest x-ray-no active disease, age-indeterminate left 5th through 7th lateral rib fractures.  Head and cervical CT negative for acute abnormality, stable ventriculomegaly out of proportion to sulcal enlargement.  IV morphine 4 mg x 1.  1 L bolus.  EDP consulted Dr. Margrette, due to patient's medical history, patient too complex for any pain anesthesia, recommended transfer to [facility].  EDP talk to Dr. Teresa with [facility], recommends transfer, hold Plavix .  Assessment and Plan: Closed left hip fracture-status post fall.  Unknown reason for fall.  Hip x-ray - Acute mildly displaced left femoral neck fracture  - Orthopedic Surgery was consulted -Pt is now s/p surgery 11/14 -Thus far, HHPT recommended by PT. Awaiting OT recs    Acute hypoxic  respiratory failure-likely narcotic induced.   RESOLVED NOW -currently on minimal O2 support   Coronary artery disease history-no chest pain  -Follows with cardiology Dr. Alvan.  History of multiple stents placed between 2007-2023.  With initial bare-metal stents to LAD 2007.  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  LAD was restented at that time, he has had several subsequent stents since then.  Most recent 2023-patient had PCI as opposed to CABG given his cognitive status and after discussions with family. - Cardiology was consulted and is following - cont asa and plavix  DAPT long term due to L main stenting   Anoxic brain injury/short-term memory deficits- 2/2 heart attack in 2007, ventricular fibrillation.  Repeats questions several times-per family every 10 seconds, but recognizes family.   Hypertension- - cont carvedilol  for now, 25 mg BID    Ischemic cardiomyopathy-volume status stable.  Last echo 2023 EF 55 to 60%.  No on diuretics.      Subjective: Without complaints. Pleasantly confused this AM  Physical Exam: Vitals:   05/14/24 0500 05/14/24 0802 05/14/24 1234 05/14/24 1613  BP:  (!) 144/74 123/70 133/72  Pulse:  92 99 95  Resp:  (!) 22  20  Temp: (!) 97.2 F (36.2 C) 98.3 F (36.8 C) 98.1 F (36.7 C) 99 F (37.2 C)  TempSrc:  Oral Oral Oral  SpO2:  93% 97% 97%  Weight:      Height:       General exam: Awake, laying in reclined chair, in nad Respiratory system: Normal respiratory effort, no wheezing  Cardiovascular system: regular rate, s1, s2 Gastrointestinal system: Soft, nondistended, positive BS Central nervous system: CN2-12 grossly intact, strength intact Extremities: Perfused, no clubbing Skin: Normal skin turgor, no notable skin lesions seen Psychiatry: Mood normal // affect normal  Data Reviewed:  Labs reviewed: Na 133, K 4.1, Cr 1.12  Family Communication: Pt in room, family at bedside  Disposition: Status is:  Inpatient Remains inpatient appropriate because: severity of illness  Planned Discharge Destination: Home with Home Health     Author: Garnette Pelt, MD 05/14/2024 4:26 PM  For on call review [email or web address].

## 2024-05-14 NOTE — Plan of Care (Signed)

## 2024-05-14 NOTE — Evaluation (Signed)
 Physical Therapy Evaluation Patient Details Name: [name] Cummings MRN: [id number] DOB: [birth year] Today's Date: 05/14/2024  History of Present Illness  Pt is 63 yo male who presents on 05/12/24 after falling at home sustaining L hip fx. Underwent L direct anterior THA on 11/14. PMH: anoxic brain injury in setting of VF (2007), CAD, CHF, GERD, seizures, HTN, HLD, depression  Clinical Impression  Pt admitted with above diagnosis. Pt from home with family where he was independent without AD for mobility but needed 24/7 supervision due to cognitive deficits from 2007. Best situation would be for pt to return home to familiar environment and get HHPT/OT and this is family's goal. Needed max A for mobility today but expect he will progress well with acute therapies and be able to go home.  Pt currently with functional limitations due to the deficits listed below (see PT Problem List). Pt will benefit from acute skilled PT to increase their independence and safety with mobility to allow discharge.           If plan is discharge home, recommend the following: A lot of help with walking and/or transfers;A lot of help with bathing/dressing/bathroom;Assist for transportation;Help with stairs or ramp for entrance;Supervision due to cognitive status   Can travel by private vehicle        Equipment Recommendations Rolling walker (2 wheels)  Recommendations for Other Services  OT consult    Functional Status Assessment Patient has had a recent decline in their functional status and demonstrates the ability to make significant improvements in function in a reasonable and predictable amount of time.     Precautions / Restrictions Precautions Precautions: Fall Recall of Precautions/Restrictions: Impaired Precaution/Restrictions Comments: STM deficits Restrictions Weight Bearing Restrictions Per Provider Order: Yes LLE Weight Bearing Per Provider Order: Weight bearing as tolerated      Mobility  Bed  Mobility Overal bed mobility: Needs Assistance Bed Mobility: Supine to Sit     Supine to sit: Max assist     General bed mobility comments: pt resisting at time due to LLE discomfort. Able to use UE's to assist at trunk but needed max A at LE's    Transfers Overall transfer level: Needs assistance Equipment used: Rolling walker (2 wheels) Transfers: Sit to/from Stand Sit to Stand: +2 safety/equipment, Max assist           General transfer comment: max A for power up with max encouragement. Daughter standing on pt's other side for safety    Ambulation/Gait Ambulation/Gait assistance: Mod assist, +2 safety/equipment Gait Distance (Feet): 3 Feet Assistive device: Rolling walker (2 wheels) Gait Pattern/deviations: Step-to pattern Gait velocity: decreased Gait velocity interpretation: <1.31 ft/sec, indicative of household ambulator   General Gait Details: pt having difficulty moving LLE though is able to pick up when firmly cued. Mod A to take steps bed to recliner  Stairs            Wheelchair Mobility     Tilt Bed    Modified Rankin (Stroke Patients Only)       Balance Overall balance assessment: Needs assistance, History of Falls Sitting-balance support: Single extremity supported, Feet supported Sitting balance-Leahy Scale: Fair     Standing balance support: Bilateral upper extremity supported, During functional activity, Reliant on assistive device for balance Standing balance-Leahy Scale: Poor Standing balance comment: heavy wt through RW  Pertinent Vitals/Pain Pain Assessment Pain Assessment: Faces Faces Pain Scale: Hurts little more Pain Location: L hip Pain Descriptors / Indicators: Aching, Sore, Operative site guarding Pain Intervention(s): Limited activity within patient's tolerance, Monitored during session, Premedicated before session    Home Living Family/patient expects to be discharged to::  Private residence Living Arrangements: Spouse/significant other;Children Available Help at Discharge: Family;Available 24 hours/day Type of Home: House Home Access: Stairs to enter Entrance Stairs-Rails: None Entrance Stairs-Number of Steps: 3   Home Layout: One level Home Equipment: BSC/3in1;Shower seat - built in Additional Comments: lives with wife, daughter, and son. Wife and daughter work from home    Prior Function Prior Level of Function : Independent/Modified Independent             Mobility Comments: independent without AD, does not drive ADLs Comments: independent     Extremity/Trunk Assessment   Upper Extremity Assessment Upper Extremity Assessment: Defer to OT evaluation    Lower Extremity Assessment Lower Extremity Assessment: LLE deficits/detail LLE Deficits / Details: hip flex 2/5, knee ext 3-/5 LLE: Unable to fully assess due to pain LLE Sensation: WNL LLE Coordination: decreased gross motor    Cervical / Trunk Assessment Cervical / Trunk Assessment: Normal  Communication   Communication Communication: No apparent difficulties    Cognition Arousal: Alert Behavior During Therapy: WFL for tasks assessed/performed   PT - Cognitive impairments: History of cognitive impairments, Memory                       PT - Cognition Comments: STM deficits since 2007 Following commands: Impaired Following commands impaired: Follows one step commands inconsistently     Cueing Cueing Techniques: Verbal cues, Gestural cues     General Comments General comments (skin integrity, edema, etc.): SPO2 96% on RA, HR up to 132 bpm with transfer. Daughter present and helpful, wife on facetime. Brother can help with getting in home as well    Exercises Total Joint Exercises Ankle Circles/Pumps: AROM, Both, 10 reps, Supine Quad Sets: AROM, Both, 5 reps, Supine Heel Slides: AAROM, Left, 5 reps, Supine Hip ABduction/ADduction: AAROM, Left, 5 reps,  Supine Straight Leg Raises: AAROM, Left, 5 reps, Supine Long Arc Quad: AROM, Left, 10 reps, Seated   Assessment/Plan    PT Assessment Patient needs continued PT services  PT Problem List Decreased strength;Decreased range of motion;Decreased activity tolerance;Decreased balance;Decreased mobility;Decreased cognition;Decreased knowledge of use of DME;Decreased safety awareness;Decreased knowledge of precautions;Pain       PT Treatment Interventions DME instruction;Gait training;Stair training;Functional mobility training;Therapeutic activities;Therapeutic exercise;Balance training;Neuromuscular re-education;Cognitive remediation;Patient/family education    PT Goals (Current goals can be found in the Care Plan section)  Acute Rehab PT Goals Patient Stated Goal: return home PT Goal Formulation: With patient/family Time For Goal Achievement: 05/28/24 Potential to Achieve Goals: Good    Frequency Min 2X/week     Co-evaluation               AM-PAC PT 6 Clicks Mobility  Outcome Measure Help needed turning from your back to your side while in a flat bed without using bedrails?: A Lot Help needed moving from lying on your back to sitting on the side of a flat bed without using bedrails?: A Lot Help needed moving to and from a bed to a chair (including a wheelchair)?: A Lot Help needed standing up from a chair using your arms (e.g., wheelchair or bedside chair)?: Total Help needed to walk in hospital room?: Total Help needed  climbing 3-5 steps with a railing? : Total 6 Click Score: 9    End of Session Equipment Utilized During Treatment: Gait belt Activity Tolerance: Patient tolerated treatment well Patient left: in chair;with call bell/phone within reach;with family/visitor present;with chair alarm set Nurse Communication: Mobility status PT Visit Diagnosis: Unsteadiness on feet (R26.81);Muscle weakness (generalized) (M62.81);Pain;Difficulty in walking, not elsewhere  classified (R26.2);History of falling (Z91.81) Pain - Right/Left: Left Pain - part of body: Hip    Time: 9249-9165 PT Time Calculation (min) (ACUTE ONLY): 44 min   Charges:   PT Evaluation $PT Eval Moderate Complexity: 1 Mod PT Treatments $Gait Training: 8-22 mins $Therapeutic Activity: 8-22 mins PT General Charges $$ ACUTE PT VISIT: 1 Visit         Richerd Lipoma, PT  Acute Rehab Services Secure chat preferred Office [phone]   Richerd CROME Oluwatobi Visser 05/14/2024, 11:16 AM

## 2024-05-14 NOTE — Discharge Instructions (Signed)

## 2024-05-15 DIAGNOSIS — S72002A Fracture of unspecified part of neck of left femur, initial encounter for closed fracture: Secondary | ICD-10-CM | POA: Diagnosis not present

## 2024-05-15 LAB — COMPREHENSIVE METABOLIC PANEL WITH GFR
ALT: 45 U/L — ABNORMAL HIGH (ref 0–44)
AST: 66 U/L — ABNORMAL HIGH (ref 15–41)
Albumin: 2.7 g/dL — ABNORMAL LOW (ref 3.5–5.0)
Alkaline Phosphatase: 63 U/L (ref 38–126)
Anion gap: 12 (ref 5–15)
BUN: 22 mg/dL (ref 8–23)
CO2: 22 mmol/L (ref 22–32)
Calcium: 8 mg/dL — ABNORMAL LOW (ref 8.9–10.3)
Chloride: 102 mmol/L (ref 98–111)
Creatinine, Ser: 0.97 mg/dL (ref 0.61–1.24)
GFR, Estimated: >60 mL/min (ref >60)
Glucose, Bld: 152 mg/dL — ABNORMAL HIGH (ref 70–99)
Potassium: 4 mmol/L (ref 3.5–5.1)
Sodium: 136 mmol/L (ref 135–145)
Total Bilirubin: 0.6 mg/dL (ref 0.0–1.2)
Total Protein: 5 g/dL — ABNORMAL LOW (ref 6.5–8.1)

## 2024-05-15 LAB — CBC
HCT: 25.8 % — ABNORMAL LOW (ref 39.0–52.0)
Hemoglobin: 8.9 g/dL — ABNORMAL LOW (ref 13.0–17.0)
MCH: 29.3 pg (ref 26.0–34.0)
MCHC: 34.5 g/dL (ref 30.0–36.0)
MCV: 84.9 fL (ref 80.0–100.0)
Platelets: 171 K/uL (ref 150–400)
RBC: 3.04 MIL/uL — ABNORMAL LOW (ref 4.22–5.81)
RDW: 11.9 % (ref 11.5–15.5)
WBC: 11.7 K/uL — ABNORMAL HIGH (ref 4.0–10.5)
nRBC: 0 % (ref 0.0–0.2)

## 2024-05-15 NOTE — TOC Transition Note (Addendum)
 Transition of Care [facility]) - Discharge Note   Patient Details  Name: [name] Cummings MRN: [id number] Date of Birth: 03-08-1961  Transition of [facility] CM/SW Contact:  Robynn Eileen Hoose, RN Phone Number: 05/15/2024, 3:17 PM   Clinical Narrative:  Secure message from provider regarding patient needing HH PT OT. Referral placed to [facility] with [facility], unable to accept patient due to out of service area. Referral to [facility] with [place] and accepted. Contact information placed on AVS.  DME recommendations noted and ordered through the Epic portal to [employer]. Follow up phone call made.     Final next level of care: Home w Home Health Services     Patient Goals and CMS Choice            Discharge Placement                       Discharge Plan and Services Additional resources added to the After Visit Summary for                            [facility] Arranged: PT, OT HH Agency: [facility] Date [facility] Agency Contacted: 05/15/24 Time HH Agency Contacted: 1515 Representative spoke with at [facility] Agency: [name]  Social Drivers of Health (SDOH) Interventions SDOH Screenings   Food Insecurity: No Food Insecurity (05/13/2024)  Housing: Low Risk  (05/13/2024)  Transportation Needs: No Transportation Needs (05/13/2024)  Utilities: Not At Risk (05/13/2024)  Tobacco Use: Medium Risk (05/13/2024)     Readmission Risk Interventions     No data to display

## 2024-05-15 NOTE — Progress Notes (Signed)
 Progress Note   Patient: [name] Cummings FMW:[id number] DOB: [birth year] DOA: 05/12/2024     3 DOS: the patient was seen and examined on 05/15/2024   Brief hospital course: 63 y.o. male with medical history significant for anoxic brain injury, coronary artery disease, hypertension, ventricular fibrillation, seizures. Patient was brought to the ED after a fall.  History is obtained from chart review and talking to family spouse and daughter at bedside.  Due to patient's anoxic brain injury, he has no short-term memory, and repeats questions multiple times.  Patient was walking outside wearing flip-flops when he fell.  Family thinks he may have tripped on the flip-flops, as it has happened before.  His son and daughter were there but did not witness the fall, but saw him on the floor.  He had otherwise been doing well, he ambulates without assistance or assistive devices.  He has no chest pain, no difficulty breathing, no cough.    ED Course: Temperature 98.9.  Heart rate 65-82.  Respiratory 18-27..  O2 sats initially 90 to 93% dropped to 88 percent about an hour after morphine was given. Pelvic x-ray - acute mildly displaced left femoral neck fracture. Chest x-ray-no active disease, age-indeterminate left 5th through 7th lateral rib fractures.  Head and cervical CT negative for acute abnormality, stable ventriculomegaly out of proportion to sulcal enlargement.  IV morphine 4 mg x 1.  1 L bolus.  EDP consulted Dr. Margrette, due to patient's medical history, patient too complex for any pain anesthesia, recommended transfer to [facility]  EDP talk to Dr. Teresa with [facility], recommends transfer, hold Plavix .  Assessment and Plan: Closed left hip fracture-status post fall.  Unknown reason for fall.  Hip x-ray - Acute mildly displaced left femoral neck fracture  - Orthopedic Surgery was consulted -Pt is now s/p surgery 11/14 -Therapy recs for HHPTOT    Acute hypoxic respiratory failure-likely  narcotic induced.   RESOLVED NOW -currently on minimal O2 support   Coronary artery disease history-no chest pain  -Follows with cardiology Dr. Alvan.  History of multiple stents placed between 2007-2023.  With initial bare-metal stents to LAD 2007.  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  LAD was restented at that time, he has had several subsequent stents since then.  Most recent 2023-patient had PCI as opposed to CABG given his cognitive status and after discussions with family. - Cardiology was consulted and is following - cont asa and plavix  DAPT long term due to L main stenting   Anoxic brain injury/short-term memory deficits- 2/2 heart attack in 2007, ventricular fibrillation.  Repeats questions several times-per family every 10 seconds, but recognizes family.   Hypertension- - cont carvedilol  for now, 25 mg BID    Ischemic cardiomyopathy-volume status stable.  Last echo 2023 EF 55 to 60%.  No on diuretics.  Acute blood loss anemia -Hemodynamically stable -Hgb 8.9 this AM, down from 11.6. No obvious source of bleed -recheck cbc in AM      Subjective: Pleasantly confulsed  Physical Exam: Vitals:   05/14/24 1959 05/15/24 0339 05/15/24 0748 05/15/24 1546  BP: 134/64 113/60 (!) 113/58 103/82  Pulse: 88 79 76 86  Resp: (!) 27 17 18 18   Temp: 98.3 F (36.8 C) 98.2 F (36.8 C) 99 F (37.2 C) 97.8 F (36.6 C)  TempSrc:      SpO2: 94% 95% 95% 95%  Weight:      Height:       General exam: Conversant,  in no acute distress Respiratory system: normal chest rise, clear, no audible wheezing Cardiovascular system: regular rhythm, s1-s2 Gastrointestinal system: Nondistended, nontender, pos BS Central nervous system: No seizures, no tremors Extremities: No cyanosis, no joint deformities Skin: No rashes, no pallor Psychiatry: Affect normal // no auditory hallucinations   Data Reviewed:  Labs reviewed: Na 136, K 4.0, Cr 0.97, WBC 11.7, Hgb 8.9, Plts  171  Family Communication: Pt in room, family at bedside  Disposition: Status is: Inpatient Remains inpatient appropriate because: severity of illness  Planned Discharge Destination: Home with Home Health     Author: Garnette Pelt, MD 05/15/2024 5:05 PM  For on call review [email or web address].

## 2024-05-15 NOTE — Progress Notes (Signed)
 Physical Therapy Treatment Patient Details Name: [name] Cummings MRN: [id number] DOB: [birth year] Today's Date: 05/15/2024   History of Present Illness Pt is 63 yo male who presents on 05/12/24 after falling at home sustaining L hip fx. Underwent L direct anterior THA on 11/14. PMH: anoxic brain injury in setting of VF arrest (2007), CAD, CHF, GERD, seizures, HTN, HLD, depression    PT Comments  Patient progressing with mobility.  Limited by cognitive deficits and pain.  Anticipate patient will continue to progress for ultimate discharge home.      If plan is discharge home, recommend the following: A lot of help with bathing/dressing/bathroom;Assist for transportation;Help with stairs or ramp for entrance;Supervision due to cognitive status;A lot of help with walking and/or transfers   Can travel by private vehicle        Equipment Recommendations  Rolling walker (2 wheels)    Recommendations for Other Services OT consult     Precautions / Restrictions Precautions Precautions: Fall Recall of Precautions/Restrictions: Impaired Precaution/Restrictions Comments: STM deficits; consistently asking why he is here, why his leg hurts Restrictions LLE Weight Bearing Per Provider Order: Weight bearing as tolerated     Mobility  Bed Mobility Overal bed mobility: Needs Assistance Bed Mobility: Sit to Supine       Sit to supine: Mod assist        Transfers Overall transfer level: Needs assistance Equipment used: Rolling walker (2 wheels) Transfers: Sit to/from Stand Sit to Stand: Mod assist           General transfer comment: pt requires constant cueing for sequencing, to stay on task    Ambulation/Gait Ambulation/Gait assistance: Min assist Gait Distance (Feet): 20 Feet Assistive device: Rolling walker (2 wheels) Gait Pattern/deviations: Step-to pattern, Decreased stride length, Decreased dorsiflexion - left Gait velocity: decreased     General Gait Details: pt  requires constant cueing to locomote   Stairs             Wheelchair Mobility     Tilt Bed    Modified Rankin (Stroke Patients Only)       Balance Overall balance assessment: Needs assistance, History of Falls Sitting-balance support: Single extremity supported, Feet supported Sitting balance-Leahy Scale: Good     Standing balance support: Bilateral upper extremity supported, During functional activity, Reliant on assistive device for balance Standing balance-Leahy Scale: Poor                              Communication    Cognition   Behavior During Therapy: WFL for tasks assessed/performed   PT - Cognitive impairments: History of cognitive impairments, Memory, Awareness, Attention                                Cueing    Exercises Total Joint Exercises Ankle Circles/Pumps: AROM, Both, 10 reps, Seated Long Arc Quad: AROM, Left, 10 reps, Seated    General Comments        Pertinent Vitals/Pain Pain Assessment Pain Assessment: Faces Faces Pain Scale: Hurts little more Pain Location: L hip Pain Descriptors / Indicators: Aching, Sore, Operative site guarding Pain Intervention(s): Limited activity within patient's tolerance, Monitored during session    Home Living                          Prior Function  PT Goals (current goals can now be found in the care plan section) Progress towards PT goals: Progressing toward goals    Frequency    Min 3X/week      PT Plan      Co-evaluation              AM-PAC PT 6 Clicks Mobility   Outcome Measure  Help needed turning from your back to your side while in a flat bed without using bedrails?: A Lot Help needed moving from lying on your back to sitting on the side of a flat bed without using bedrails?: A Lot Help needed moving to and from a bed to a chair (including a wheelchair)?: A Lot Help needed standing up from a chair using your arms (e.g.,  wheelchair or bedside chair)?: A Lot Help needed to walk in hospital room?: A Lot Help needed climbing 3-5 steps with a railing? : A Lot 6 Click Score: 12    End of Session Equipment Utilized During Treatment: Gait belt Activity Tolerance: Patient tolerated treatment well Patient left: with call bell/phone within reach;with family/visitor present;in bed;with bed alarm set   PT Visit Diagnosis: Unsteadiness on feet (R26.81);Muscle weakness (generalized) (M62.81);Pain;Difficulty in walking, not elsewhere classified (R26.2);History of falling (Z91.81) Pain - Right/Left: Left Pain - part of body: Hip     Time: 8679-8654 PT Time Calculation (min) (ACUTE ONLY): 25 min  Charges:    $Gait Training: 8-22 mins $Therapeutic Exercise: 8-22 mins PT General Charges $$ ACUTE PT VISIT: 1 Visit                     05/15/2024 Lebron, PT Acute Rehabilitation Services Office:  [phone]    Katharina Venus HERO 05/15/2024, 1:54 PM

## 2024-05-15 NOTE — Evaluation (Signed)
 Occupational Therapy Evaluation Patient Details Name: [name] Cummings MRN: [id number] DOB: [birth year] Today's Date: 05/15/2024   History of Present Illness   Pt is 63 yo male who presents on 05/12/24 after falling at home sustaining L hip fx. Underwent L direct anterior THA on 11/14. PMH: anoxic brain injury in setting of VF arrest (2007), CAD, CHF, GERD, seizures, HTN, HLD, depression     Clinical Impressions PTA, pt lived with wife who provided reminders for participation in BADL. Upon eval, pt limited by pain and baseline cognitive deficits frequently asking why L hip hurts; able to recall hip surgery with increased time, but asking every ~30 seconds. Wife reports cognition is at baseline. Pt follows one step commands with repeated cues to attend to or sustain tasks. Pt grossly needing min A +2 for STS and SPT to chair today as well as up to max A for LB ADL. Will continue to follow. Recommending HHOT at discharge.      If plan is discharge home, recommend the following:   A lot of help with walking and/or transfers;A lot of help with bathing/dressing/bathroom;Assistance with cooking/housework;Assist for transportation;Help with stairs or ramp for entrance     Functional Status Assessment   Patient has had a recent decline in their functional status and demonstrates the ability to make significant improvements in function in a reasonable and predictable amount of time.     Equipment Recommendations   BSC/3in1     Recommendations for Other Services         Precautions/Restrictions   Precautions Precautions: Fall Recall of Precautions/Restrictions: Impaired Precaution/Restrictions Comments: STM deficits; consistently asking why he is here, why his leg hurts; able to recall THA with cues Restrictions Weight Bearing Restrictions Per Provider Order: Yes LLE Weight Bearing Per Provider Order: Weight bearing as tolerated     Mobility Bed Mobility Overal bed mobility:  Needs Assistance Bed Mobility: Supine to Sit     Supine to sit: Mod assist     General bed mobility comments: cues for sequence, asisst for progressing LLE and bringing hips toward EOB    Transfers Overall transfer level: Needs assistance Equipment used: Rolling walker (2 wheels) Transfers: Sit to/from Stand, Bed to chair/wheelchair/BSC Sit to Stand: Min assist, +2 physical assistance, +2 safety/equipment     Step pivot transfers: Min assist, +2 safety/equipment     General transfer comment: pt requires constant cueing for sequencing, to stay on task. benefits from direct cues      Balance Overall balance assessment: Needs assistance, History of Falls Sitting-balance support: Single extremity supported, Feet supported Sitting balance-Leahy Scale: Good     Standing balance support: Bilateral upper extremity supported, During functional activity, Reliant on assistive device for balance Standing balance-Leahy Scale: Poor Standing balance comment: reliant on RW and external support                           ADL either performed or assessed with clinical judgement   ADL Overall ADL's : Needs assistance/impaired Eating/Feeding: Set up;Sitting   Grooming: Sitting;Minimal assistance   Upper Body Bathing: Set up;Sitting   Lower Body Bathing: Moderate assistance;Sit to/from stand   Upper Body Dressing : Set up;Sitting   Lower Body Dressing: Moderate assistance;Sitting/lateral leans;Maximal assistance Lower Body Dressing Details (indicate cue type and reason): to don socks             Functional mobility during ADLs: Minimal assistance;+2 for safety/equipment;+2 for physical assistance;Rolling walker (2  wheels)       Vision         Perception         Praxis         Pertinent Vitals/Pain Pain Assessment Pain Assessment: Faces Faces Pain Scale: Hurts little more Pain Location: L hip Pain Descriptors / Indicators: Aching, Sore, Operative site  guarding Pain Intervention(s): Limited activity within patient's tolerance, Monitored during session     Extremity/Trunk Assessment Upper Extremity Assessment Upper Extremity Assessment: Overall WFL for tasks assessed   Lower Extremity Assessment Lower Extremity Assessment: Defer to PT evaluation   Cervical / Trunk Assessment Cervical / Trunk Assessment: Normal   Communication Communication Communication: No apparent difficulties   Cognition Arousal: Alert Behavior During Therapy: WFL for tasks assessed/performed Cognition: History of cognitive impairments             OT - Cognition Comments: pt with very brief short term memory, needing repeated education for orientatoin to why he is in the hospital.                 Following commands: Impaired Following commands impaired: Follows one step commands inconsistently     Cueing  General Comments   Cueing Techniques: Verbal cues;Gestural cues      Exercises     Shoulder Instructions      Home Living Family/patient expects to be discharged to:: Private residence Living Arrangements: Spouse/significant other;Children Available Help at Discharge: Family;Available 24 hours/day Type of Home: House Home Access: Stairs to enter [employer] of Steps: 3 Entrance Stairs-Rails: None Home Layout: One level     Bathroom Shower/Tub: Walk-in shower;Tub/shower unit   Bathroom Toilet: Standard     Home Equipment: BSC/3in1;Shower seat - built in   Additional Comments: lives with wife, daughter, and son. Wife and daughter work from home      Prior Functioning/Environment Prior Level of Function : Independent/Modified Independent             Mobility Comments: independent without AD, does not drive ADLs Comments: independent with cues/reminders to perform BADLs    OT Problem List: Decreased strength;Decreased activity tolerance;Impaired balance (sitting and/or standing);Decreased  coordination;Decreased cognition;Decreased safety awareness;Decreased knowledge of use of DME or AE;Decreased knowledge of precautions;Pain   OT Treatment/Interventions: Self-care/ADL training;Therapeutic exercise;DME and/or AE instruction;Therapeutic activities;Patient/family education;Balance training      OT Goals(Current goals can be found in the care plan section)   Acute Rehab OT Goals Patient Stated Goal: get bettter OT Goal Formulation: With patient Time For Goal Achievement: 05/29/24 Potential to Achieve Goals: Good   OT Frequency:  Min 2X/week    Co-evaluation              AM-PAC OT 6 Clicks Daily Activity     Outcome Measure Help from another person eating meals?: A Little Help from another person taking care of personal grooming?: A Little Help from another person toileting, which includes using toliet, bedpan, or urinal?: A Lot Help from another person bathing (including washing, rinsing, drying)?: A Lot Help from another person to put on and taking off regular upper body clothing?: A Little Help from another person to put on and taking off regular lower body clothing?: A Lot 6 Click Score: 15   End of Session Equipment Utilized During Treatment: Gait belt;Rolling walker (2 wheels) Nurse Communication: Mobility status  Activity Tolerance: Patient tolerated treatment well Patient left: in chair;with call bell/phone within reach;with chair alarm set;with family/visitor present  OT Visit Diagnosis: Unsteadiness on feet (  R26.81);Muscle weakness (generalized) (M62.81);Other symptoms and signs involving cognitive function                Time: 0911-0940 OT Time Calculation (min): 29 min Charges:  OT General Charges $OT Visit: 1 Visit OT Evaluation $OT Eval Low Complexity: 1 Low OT Treatments $Self Care/Home Management : 8-22 mins  Elma JONETTA Lebron FREDERICK, OTR/L [facility] Office: [phone]   Elma JONETTA Lebron 05/15/2024, 2:10 PM

## 2024-05-15 NOTE — Progress Notes (Signed)
 Subjective: 2 Days Post-Op Procedure(s) (LRB): ARTHROPLASTY, HIP, TOTAL, ANTERIOR APPROACH (Left) Patient reports pain as mild.   Patient seen in rounds for Dr. Fidel. Patient is resting in bed on exam this morning. No acute events overnight. Mother at the bedside with him. She reports he sat in the recliner for 9 hours yesterday and was pretty sore because of that. They do not feel he is at a level they can care for him at home yet. They are hoping to be present for PT sessions today.  We will continue therapy today.   Objective: Vital signs in last 24 hours: Temp:  [98.1 F (36.7 C)-99 F (37.2 C)] 99 F (37.2 C) (11/16 0748) Pulse Rate:  [76-99] 76 (11/16 0748) Resp:  [17-27] 18 (11/16 0748) BP: (113-134)/(58-72) 113/58 (11/16 0748) SpO2:  [94 %-97 %] 95 % (11/16 0748)  Intake/Output from previous day:  Intake/Output Summary (Last 24 hours) at 05/15/2024 0829 Last data filed at 05/14/2024 2343 Gross per 24 hour  Intake 50 ml  Output 2350 ml  Net -2300 ml     Intake/Output this shift: No intake/output data recorded.  Labs: Recent Labs    05/12/24 1800 05/14/24 0402 05/15/24 0701  HGB 14.0 11.6* 8.9*   Recent Labs    05/14/24 0402 05/15/24 0701  WBC 16.5* 11.7*  RBC 3.88* 3.04*  HCT 33.5* 25.8*  PLT 194 171   Recent Labs    05/14/24 0719 05/15/24 0701  NA 133* 136  K 4.1 4.0  CL 99 102  CO2 25 22  BUN 23 22  CREATININE 1.12 0.97  GLUCOSE 184* 152*  CALCIUM  8.3* 8.0*   Recent Labs    05/12/24 1800  INR 1.0    Exam: General - Patient is Alert. Cognitive impairment at baseline, but is conversational. Extremity - Neurologically intact Sensation intact distally Intact pulses distally Dorsiflexion/Plantar flexion intact Dressing - dressing C/D/I Motor Function - intact, moving foot and toes well on exam.   Past Medical History:  Diagnosis Date   Anxiety    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   CAD (coronary  artery disease)    a. s/p BMS to LAD in 2007 with stent thrombosis and VF arrest and LAD re-stented b. DES to distal LAD in 2010 c. ISR and DES to LAD in 2017 d. DES to LCx in 04/2017 and severe ostial stenosis of small RCA e. 05/2019: DES to proximal LAD.    CHF (congestive heart failure) (HCC)    Depression    Dizziness    Dysphagia, unspecified(787.20)    GERD (gastroesophageal reflux disease)    History of seizure disorder    Hyperlipidemia, mixed    Hypertension    Hypertensive heart disease    unspecified   Ischemic cardiomyopathy    a. 06/2014 Echo: EF 60-65%.   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    Assessment/Plan: 2 Days Post-Op Procedure(s) (LRB): ARTHROPLASTY, HIP, TOTAL, ANTERIOR APPROACH (Left) Principal Problem:   Closed left hip fracture (HCC) Active Problems:   Essential hypertension   Ischemic cardiomyopathy   History of ventricular fibrillation   CAD (coronary artery disease)   Brain anoxic injury (HCC)  Estimated body mass index is 30.43 kg/m as calculated from the following:   Height as of this encounter: 6' 2 (1.88  m).   Weight as of this encounter: 107.5 kg. Advance diet Up with therapy  DVT Prophylaxis - Plavix  Weight bearing as tolerated.  Hgb stable at 8.9 this AM We discussed that WBC is appropriate for postop state  Up with PT today Patient's family requests gait belt to use at home  Rosina Calin, PA-C Orthopedic Surgery [phone] 05/15/2024, 8:29 AM

## 2024-05-15 NOTE — Care Management (Signed)
    Durable Medical Equipment  (From admission, onward)           Start     Ordered   05/15/24 1527  For home use only DME Bedside commode  Once       Comments: Patient in room without bathroom access.  Question:  Patient needs a bedside commode to treat with the following condition  Answer:  Weakness   05/15/24 1527   05/15/24 1527  For home use only DME 3 n 1  Once        05/15/24 1527   05/15/24 1526  For home use only DME Walker rolling  Once       Question Answer Comment  Walker: With 5 Inch Wheels   Patient needs a walker to treat with the following condition Weakness      05/15/24 1527           Patient had Left total hip arthroplasty and has has a recent decline in functional status. Patient is max assist with mobility.

## 2024-05-15 NOTE — Progress Notes (Signed)
 Transition of Care [facility]) - CAGE-AID Screening   Patient Details  Name: [name] Cummings MRN: [id number] Date of Birth: 1960-12-26  Transition of Care [facility]) CM/SW Contact:    Sallyanne MALVA Mettle, RN Phone Number: 05/15/2024, 7:47 PM   CAGE-AID Screening: Substance Abuse Screening unable to be completed due to: : Patient unable to participate (see H&P memory issues)

## 2024-05-16 ENCOUNTER — Encounter (HOSPITAL_COMMUNITY): Payer: Self-pay | Admitting: Orthopedic Surgery

## 2024-05-16 DIAGNOSIS — S72002A Fracture of unspecified part of neck of left femur, initial encounter for closed fracture: Secondary | ICD-10-CM | POA: Diagnosis not present

## 2024-05-16 LAB — CBC
HCT: 23.4 % — ABNORMAL LOW (ref 39.0–52.0)
Hemoglobin: 8 g/dL — ABNORMAL LOW (ref 13.0–17.0)
MCH: 29.3 pg (ref 26.0–34.0)
MCHC: 34.2 g/dL (ref 30.0–36.0)
MCV: 85.7 fL (ref 80.0–100.0)
Platelets: 152 K/uL (ref 150–400)
RBC: 2.73 MIL/uL — ABNORMAL LOW (ref 4.22–5.81)
RDW: 12 % (ref 11.5–15.5)
WBC: 9.8 K/uL (ref 4.0–10.5)
nRBC: 0.3 % — ABNORMAL HIGH (ref 0.0–0.2)

## 2024-05-16 LAB — IRON AND TIBC
Iron: 38 ug/dL — ABNORMAL LOW (ref 45–182)
Saturation Ratios: 15 % — ABNORMAL LOW (ref 17.9–39.5)
TIBC: 256 ug/dL (ref 250–450)
UIBC: 218 ug/dL

## 2024-05-16 MED ORDER — HYDROCODONE-ACETAMINOPHEN 5-325 MG PO TABS: 1.0000 | ORAL_TABLET | ORAL | 0 refills | Status: DC | PRN

## 2024-05-16 MED ORDER — ASPIRIN 81 MG PO CHEW: 81.0000 mg | CHEWABLE_TABLET | Freq: Two times a day (BID) | ORAL | 0 refills | Status: DC

## 2024-05-16 NOTE — Plan of Care (Signed)
 Patient seen by cardiology on 11/15 for preop evaluation. Refer to Dr. Marko note from 11/15 for recommendations. No further recommendations. Cardiology will sign off. Discussed with Dr. Cindy.   -Emeline Calender, DO

## 2024-05-16 NOTE — Progress Notes (Signed)
 Progress Note   Patient: [name] Cummings FMW:[id number] DOB: 1961/01/24 DOA: 05/12/2024     4 DOS: the patient was seen and examined on 05/16/2024   Brief hospital course: 63 y.o. male with medical history significant for anoxic brain injury, coronary artery disease, hypertension, ventricular fibrillation, seizures. Patient was brought to the ED after a fall.  History is obtained from chart review and talking to family spouse and daughter at bedside.  Due to patient's anoxic brain injury, he has no short-term memory, and repeats questions multiple times.  Patient was walking outside wearing flip-flops when he fell.  Family thinks he may have tripped on the flip-flops, as it has happened before.  His son and daughter were there but did not witness the fall, but saw him on the floor.  He had otherwise been doing well, he ambulates without assistance or assistive devices.  He has no chest pain, no difficulty breathing, no cough.    ED Course: Temperature 98.9.  Heart rate 65-82.  Respiratory 18-27..  O2 sats initially 90 to 93% dropped to 88 percent about an hour after morphine was given. Pelvic x-ray - acute mildly displaced left femoral neck fracture. Chest x-ray-no active disease, age-indeterminate left 5th through 7th lateral rib fractures.  Head and cervical CT negative for acute abnormality, stable ventriculomegaly out of proportion to sulcal enlargement.  IV morphine 4 mg x 1.  1 L bolus.  EDP consulted Dr. Margrette, due to patient's medical history, patient too complex for any pain anesthesia, recommended transfer to [facility].  EDP talk to Dr. Teresa with [facility], recommends transfer, hold Plavix .  Assessment and Plan: Closed left hip fracture-status post fall.  Unknown reason for fall.  Hip x-ray - Acute mildly displaced left femoral neck fracture  - Orthopedic Surgery was consulted -Pt is now s/p surgery 11/14 -Therapy recs for HHPTOT, however family is also interested in SNF. See SW and  CM documentation   Acute hypoxic respiratory failure-likely narcotic induced.   RESOLVED NOW -currently on minimal O2 support   Coronary artery disease history-no chest pain  -Follows with cardiology Dr. Alvan.  History of multiple stents placed between 2007-2023.  With initial bare-metal stents to LAD 2007.  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  LAD was restented at that time, he has had several subsequent stents since then.  Most recent 2023-patient had PCI as opposed to CABG given his cognitive status and after discussions with family. - Cardiology was consulted and is following - cont asa and plavix  DAPT long term due to L main stenting   Anoxic brain injury/short-term memory deficits- 2/2 heart attack in 2007, ventricular fibrillation.  Repeats questions several times-per family every 10 seconds, but recognizes family.   Hypertension- - cont carvedilol  for now, 25 mg BID    Ischemic cardiomyopathy-volume status stable.  Last echo 2023 EF 55 to 60%.  No on diuretics.  Acute blood loss anemia secondary to surgical blood loss -Hemodynamically stable -Hgb 8.0 this AM, down from 11.6 following surgery. No obvious source of blood loss -recheck cbc in AM -check iron level      Subjective: Remains pleasantly confused. Eager to go home  Physical Exam: Vitals:   05/15/24 2034 05/16/24 0328 05/16/24 0748 05/16/24 1432  BP: 122/66 114/61 116/66 128/63  Pulse: 83 72 82 79  Resp:  (!) 25 17 16   Temp: 98.8 F (37.1 C) 98.6 F (37 C) 98.2 F (36.8 C) 98.5 F (36.9 C)  TempSrc: Oral  Oral  Oral  SpO2: 95% 96% 95% 96%  Weight:      Height:       General exam: Awake, laying in bed, in nad Respiratory system: Normal respiratory effort, no wheezing Cardiovascular system: regular rate, s1, s2 Gastrointestinal system: Soft, nondistended, positive BS Central nervous system: CN2-12 grossly intact, strength intact Extremities: Perfused, no clubbing Skin: Normal skin  turgor, no notable skin lesions seen Psychiatry: Mood normal // affect is normal  Data Reviewed:  Labs reviewed: WBC 9.8, Hgb 8.0, Plts 152  Family Communication: Pt in room, family at bedside  Disposition: Status is: Inpatient Remains inpatient appropriate because: severity of illness  Planned Discharge Destination: Home with Home Health     Author: Garnette Pelt, MD 05/16/2024 4:24 PM  For on call review [email or web address].

## 2024-05-16 NOTE — [place] FL2 (Signed)
   MEDICAID FL2 LEVEL OF CARE FORM     IDENTIFICATION  Patient Name: [name] Cummings Birthdate: 10/09/1960 Sex: male Admission Date (Current Location): 05/12/2024  [facility] and [place] Number:  Producer, Television/film/video and Address:  [facility], 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   Rash 08/28/2018   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   Demand ischemia (HCC) 08/28/2018   Pain in thoracic spine 04/14/2018   Dizziness 01/25/2018   Excessive sleepiness 01/25/2018   Imbalance 06/09/2017   Impacted cerumen of right ear 06/09/2017   Tinnitus of right ear 06/09/2017   Abnormal nuclear cardiac imaging test 05/15/2017   Physical exam, annual 09/05/2015   Numbness and tingling of left side of face 08/16/2015   Left sided numbness 08/15/2015   Anxiety 07/21/2015   Unstable angina (HCC) 07/21/2015   Hypertensive heart disease    Hyperlipidemia    CAD (coronary artery disease)    Brain anoxic injury (HCC)    Progressive angina (HCC)    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/03/2011   CAD 07/16/2009   ANOXIC BRAIN DAMAGE 01/04/2009   Hyperlipidemia LDL goal <70 11/10/2008   Anxiety state 11/10/2008   Depression 11/10/2008   Essential hypertension 11/10/2008   Ischemic cardiomyopathy 11/10/2008   History of ventricular fibrillation 11/10/2008   Dysphagia 11/10/2008   SEIZURE DISORDER, HX OF 11/10/2008   Dysphagia  11/10/2008    Orientation RESPIRATION BLADDER Height & Weight     Self, Place  Normal Incontinent, External catheter Weight: 236 lb 15.9 oz (107.5 kg) Height:  6' 2 (188 cm)  BEHAVIORAL SYMPTOMS/MOOD NEUROLOGICAL BOWEL NUTRITION STATUS    Convulsions/Seizures   Diet (see discharge summary)  AMBULATORY STATUS COMMUNICATION OF NEEDS Skin   Limited Assist Verbally Surgical wounds                       Personal Care Assistance Level of Assistance  Bathing, Feeding, Dressing Bathing Assistance: Limited assistance Feeding assistance: Limited assistance Dressing Assistance: Limited assistance     Functional Limitations Info  Sight, Hearing, Speech Sight Info: Adequate Hearing Info: Adequate Speech Info: Adequate    SPECIAL CARE FACTORS FREQUENCY  PT (By licensed PT), OT (By licensed OT)     PT Frequency: 5x week OT Frequency: 5x week            Contractures Contractures Info: Not present    Additional Factors Info  Code Status, Allergies Code Status Info: full Allergies Info: Sulfamethoxazole-trimethoprim, Aripiprazole, Crestor  (Rosuvastatin  Calcium ), Duloxetine, Gabapentin, Hydromorphone Hcl, Imdur  (Isosorbide  Nitrate), Lorazepam, Olanzapine, Pseudoephedrine, Risperidone, Terfenadine, Atorvastatin , Biaxin (Clarithromycin)           Current Medications (05/16/2024):  This is the current hospital active medication list Current Facility-Administered Medications  Medication Dose Route Frequency Provider Last Rate Last Admin   0.9 %  sodium chloride  infusion   Intravenous Continuous Swinteck, Redell, MD   Stopped at  05/14/24 1706   acetaminophen  (TYLENOL ) tablet 325-650 mg  325-650 mg Oral Q6H PRN Fidel Rogue, MD   500 mg at 05/14/24 0633   albuterol (PROVENTIL) (2.5 MG/3ML) 0.083% nebulizer solution 2.5 mg  2.5 mg Nebulization Q4H PRN Swinteck, Rogue, MD       alum & mag hydroxide-simeth (MAALOX/MYLANTA) 200-200-20 MG/5ML suspension 30 mL  30 mL Oral Q4H PRN  Swinteck, Rogue, MD       aspirin  chewable tablet 81 mg  81 mg Oral BID Fidel Rogue, MD   81 mg at 05/16/24 0809   brexpiprazole (REXULTI) tablet 1 mg  1 mg Oral QHS Swinteck, Rogue, MD   1 mg at 05/15/24 2040   busPIRone (BUSPAR) tablet 15 mg  15 mg Oral BID Fidel Rogue, MD   15 mg at 05/16/24 0809   carvedilol  (COREG ) tablet 25 mg  25 mg Oral BID WC Fidel Rogue, MD   25 mg at 05/16/24 9191   chlorproMAZINE  (THORAZINE ) tablet 25 mg  25 mg Oral TID PRN Cindy Garnette POUR, MD   25 mg at 05/15/24 2048   clopidogrel  (PLAVIX ) tablet 75 mg  75 mg Oral Daily Fidel Rogue, MD   75 mg at 05/16/24 0808   diphenhydrAMINE (BENADRYL) 12.5 MG/5ML elixir 12.5-25 mg  12.5-25 mg Oral Q4H PRN Fidel Rogue, MD       docusate sodium (COLACE) capsule 100 mg  100 mg Oral BID Fidel Rogue, MD   100 mg at 05/16/24 0809   feeding supplement (ENSURE PLUS HIGH PROTEIN) liquid 237 mL  237 mL Oral BID BM Swinteck, Rogue, MD   237 mL at 05/16/24 1417   FLUoxetine  (PROZAC ) capsule 20 mg  20 mg Oral TID Fidel Rogue, MD   20 mg at 05/16/24 9191   fluticasone  (FLONASE ) 50 MCG/ACT nasal spray 1 spray  1 spray Each Nare Daily PRN Fidel Rogue, MD       HYDROcodone -acetaminophen  (NORCO) 7.5-325 MG per tablet 1-2 tablet  1-2 tablet Oral Q4H PRN Fidel Rogue, MD   2 tablet at 05/16/24 1125   HYDROcodone -acetaminophen  (NORCO/VICODIN) 5-325 MG per tablet 1-2 tablet  1-2 tablet Oral Q4H PRN Fidel Rogue, MD   2 tablet at 05/15/24 9285   loratadine  (CLARITIN ) tablet 10 mg  10 mg Oral q morning Swinteck, Brian, MD   10 mg at 05/16/24 0808   menthol (CEPACOL) lozenge 3 mg  1 lozenge Oral PRN Fidel Rogue, MD       Or   phenol (CHLORASEPTIC) mouth spray 1 spray  1 spray Mouth/Throat PRN Swinteck, Rogue, MD       methocarbamol (ROBAXIN) tablet 500 mg  500 mg Oral Q6H PRN Swinteck, Rogue, MD       Or   methocarbamol (ROBAXIN) injection 500 mg  500 mg Intravenous Q6H PRN Swinteck, Rogue, MD        metoCLOPramide (REGLAN) tablet 5-10 mg  5-10 mg Oral Q8H PRN Swinteck, Rogue, MD       Or   metoCLOPramide (REGLAN) injection 5-10 mg  5-10 mg Intravenous Q8H PRN Swinteck, Rogue, MD       morphine (PF) 2 MG/ML injection 0.5-1 mg  0.5-1 mg Intravenous Q2H PRN Swinteck, Rogue, MD       nitroGLYCERIN  (NITROSTAT ) SL tablet 0.4 mg  0.4 mg Sublingual Q5 min PRN Swinteck, Rogue, MD       ondansetron  (ZOFRAN ) tablet 4 mg  4 mg Oral Q6H PRN Fidel Rogue, MD       Or  ondansetron  (ZOFRAN ) injection 4 mg  4 mg Intravenous Q6H PRN Swinteck, Redell, MD       oxybutynin (DITROPAN-XL) 24 hr tablet 10 mg  10 mg Oral BID Fidel Redell, MD   10 mg at 05/16/24 9191   pantoprazole  (PROTONIX ) EC tablet 40 mg  40 mg Oral QPM Swinteck, Redell, MD   40 mg at 05/15/24 1717   polyethylene glycol (MIRALAX / GLYCOLAX) packet 17 g  17 g Oral Daily PRN Fidel Redell, MD   17 g at 05/15/24 2040   pravastatin  (PRAVACHOL ) tablet 80 mg  80 mg Oral QPM Swinteck, Redell, MD   80 mg at 05/15/24 1717   senna (SENOKOT) tablet 8.6 mg  1 tablet Oral BID Fidel Redell, MD   8.6 mg at 05/16/24 0808     Discharge Medications: Please see discharge summary for a list of discharge medications.  Relevant Imaging Results:  Relevant Lab Results:   Additional Information SSN: [id number]  Bridget Cordella Simmonds, LCSW

## 2024-05-16 NOTE — Progress Notes (Signed)
 Physical Therapy Treatment Patient Details Name: [name] Cummings MRN: [id number] DOB: [birth year] Today's Date: 05/16/2024   History of Present Illness Pt is 63 yo male who presents on 05/12/24 after falling at home sustaining L hip fx. Underwent L direct anterior THA on 11/14. PMH: anoxic brain injury in setting of VF arrest (2007), CAD, CHF, GERD, seizures, HTN, HLD, depression    PT Comments  Pt making slow progress. Worked with pt today when pain meds were further out and gave good picture of what family would deal with at times at home. Pt needed max A to come to EOB and is very resistant to move when he feels pain and can't remember why his hip hurts. Based on current functional level, patient will benefit from continued inpatient follow up therapy, <3 hours/day, family agreeable. PT will continue to follow.    If plan is discharge home, recommend the following: A lot of help with bathing/dressing/bathroom;Assist for transportation;Help with stairs or ramp for entrance;Supervision due to cognitive status;A lot of help with walking and/or transfers   Can travel by private vehicle     No  Equipment Recommendations  Rolling walker (2 wheels)    Recommendations for Other Services       Precautions / Restrictions Precautions Precautions: Fall Recall of Precautions/Restrictions: Impaired Precaution/Restrictions Comments: STM deficits; consistently asking why he is here, why his leg hurts; able to recall THA with cues Restrictions Weight Bearing Restrictions Per Provider Order: Yes LLE Weight Bearing Per Provider Order: Weight bearing as tolerated     Mobility  Bed Mobility Overal bed mobility: Needs Assistance Bed Mobility: Supine to Sit     Supine to sit: Max assist, +2 for physical assistance     General bed mobility comments: pt keeping L knee locked straight and would not bend it in bed with verbal or tactile cues. Required increased assisit to come to EOB due to this. Max  A needed with bed pad with extra support from pt's wife    Transfers Overall transfer level: Needs assistance Equipment used: Rolling walker (2 wheels) Transfers: Sit to/from Stand, Bed to chair/wheelchair/BSC Sit to Stand: Mod assist   Step pivot transfers: Mod assist       General transfer comment: mod A for power up needed due to L hip pain    Ambulation/Gait Ambulation/Gait assistance: Min assist Gait Distance (Feet): 10 Feet Assistive device: Rolling walker (2 wheels) Gait Pattern/deviations: Step-to pattern, Decreased stride length, Decreased dorsiflexion - left Gait velocity: decreased Gait velocity interpretation: <1.31 ft/sec, indicative of household ambulator   General Gait Details: pt less tolerant of ambulation today due to pain   Stairs Stairs: Yes Stairs assistance: Min assist Stair Management: Two rails, Step to pattern, Forwards Number of Stairs: 2 General stair comments: allowed pt to use bilateral rails (at home he will use R rail and HHA on L). Tolerated stepping up better than he tolerate gait today. Mod cues for sequencing   Wheelchair Mobility     Tilt Bed    Modified Rankin (Stroke Patients Only)       Balance Overall balance assessment: Needs assistance, History of Falls Sitting-balance support: Single extremity supported, Feet supported Sitting balance-Leahy Scale: Good     Standing balance support: Bilateral upper extremity supported, During functional activity, Reliant on assistive device for balance Standing balance-Leahy Scale: Poor Standing balance comment: reliant on RW and external support  Communication Communication Communication: No apparent difficulties  Cognition Arousal: Alert Behavior During Therapy: WFL for tasks assessed/performed   PT - Cognitive impairments: History of cognitive impairments, Memory, Awareness, Attention                       PT - Cognition  Comments: STM deficits since 2007 Following commands: Impaired Following commands impaired: Follows one step commands inconsistently    Cueing Cueing Techniques: Verbal cues, Gestural cues  Exercises Total Joint Exercises Ankle Circles/Pumps: AROM, Both, 10 reps, Seated Quad Sets: AROM, Both, 5 reps, Seated Heel Slides: Limitations Heel Slides Limitations: pt resisting L knee flex in bed Long Arc Quad: AROM, Left, 10 reps, Seated    General Comments General comments (skin integrity, edema, etc.): VSS. Wife present throughout session. Discussed d/c plan and pt's slowed progress due to pain and cognitive limitations. Wife agreeable to ST SNF      Pertinent Vitals/Pain Pain Assessment Pain Assessment: Faces Faces Pain Scale: Hurts even more Pain Location: L hip Pain Descriptors / Indicators: Aching, Sore, Operative site guarding Pain Intervention(s): Limited activity within patient's tolerance, Monitored during session (not quite time for pain meds)    Home Living                          Prior Function            PT Goals (current goals can now be found in the care plan section) Acute Rehab PT Goals Patient Stated Goal: return home PT Goal Formulation: With patient/family Time For Goal Achievement: 05/28/24 Potential to Achieve Goals: Good Progress towards PT goals: Progressing toward goals    Frequency    Min 3X/week      PT Plan      Co-evaluation              AM-PAC PT 6 Clicks Mobility   Outcome Measure  Help needed turning from your back to your side while in a flat bed without using bedrails?: Total Help needed moving from lying on your back to sitting on the side of a flat bed without using bedrails?: Total Help needed moving to and from a bed to a chair (including a wheelchair)?: A Lot Help needed standing up from a chair using your arms (e.g., wheelchair or bedside chair)?: A Lot Help needed to walk in hospital room?: A Lot Help  needed climbing 3-5 steps with a railing? : A Lot 6 Click Score: 10    End of Session Equipment Utilized During Treatment: Gait belt Activity Tolerance: Patient limited by pain Patient left: with call bell/phone within reach;with family/visitor present;in chair;with chair alarm set Nurse Communication: Mobility status PT Visit Diagnosis: Unsteadiness on feet (R26.81);Muscle weakness (generalized) (M62.81);Pain;Difficulty in walking, not elsewhere classified (R26.2);History of falling (Z91.81) Pain - Right/Left: Left Pain - part of body: Hip     Time: 0911-0958 PT Time Calculation (min) (ACUTE ONLY): 47 min  Charges:    $Gait Training: 8-22 mins $Therapeutic Exercise: 8-22 mins $Therapeutic Activity: 8-22 mins PT General Charges $$ ACUTE PT VISIT: 1 Visit                     Richerd Lipoma, PT  Acute Rehab Services Secure chat preferred Office [phone]    Richerd CROME Shaman Muscarella 05/16/2024, 12:01 PM

## 2024-05-16 NOTE — TOC Progression Note (Addendum)
 Transition of Care [facility]) - Progression Note    Patient Details  Name: [name] Cummings MRN: [id number] Date of Birth: [birth year]  Transition of Care [facility]) CM/SW Contact  Bridget Cordella Simmonds, LCSW Phone Number: 05/16/2024, 2:17 PM  Clinical Narrative:   CSW informed by PT that pt wife [name] now asking for SNF.  CSW spoke with pt and wife, they stated preference for [facility] off [address] in [place], informed them that this is assisted living, no skilled nursing.  They were somewhat skeptical of this, stating they may chose to DC home in that case.  Medicare choice document provided for [place] SNF options, also for [facility], which was another facility they were interested in.  [name] will review SNF options.   1440: CSW spoke with [name], she reports she spoke to [name] and would like referral sent there.  Referral sent.   Expected Discharge Plan: Home w Home Health Services Barriers to Discharge: Barriers Resolved               Expected Discharge Plan and Services   Discharge Planning Services: CM Consult Post Acute Care Choice: Home Health Living arrangements for the past 2 months: Single Family Home                 DME Arranged: Bedside commode, 3-N-1, Walker rolling DME Agency: [employer] Date DME Agency Contacted: 05/16/24 Time DME Agency Contacted: 1340 Representative spoke with at DME Agency: [name] HH Arranged: PT, OT HH Agency: [facility] Date [facility] Agency Contacted: 05/15/24 Time HH Agency Contacted: 1515 Representative spoke with at [facility] Agency: [name]   Social Drivers of Health (SDOH) Interventions SDOH Screenings   Food Insecurity: No Food Insecurity (05/13/2024)  Housing: Low Risk  (05/13/2024)  Transportation Needs: No Transportation Needs (05/13/2024)  Utilities: Not At Risk (05/13/2024)  Tobacco Use: Medium Risk (05/13/2024)    Readmission Risk Interventions     No data to display

## 2024-05-16 NOTE — Progress Notes (Signed)
 RE:  [name] Cummings       Date of Birth:  06/30/1960     Date:   05/16/24       To Whom It May Concern:  Please be advised that the above-named patient will require a short-term nursing home stay - anticipated 30 days or less for rehabilitation and strengthening.  The plan is for return home.                 MD signature                Date

## 2024-05-16 NOTE — TOC Transition Note (Signed)
 Transition of Care [facility]) - Discharge Note   Patient Details  Name: [name] Cummings MRN: [id number] Date of Birth: [birth year]  [facility]) CM/SW Contact:  Pavle Wiler M, RN Phone Number: 05/16/2024, 1:41 PM   Clinical Narrative:    Patient medically stable for discharge home with spouse and [facility] services as previously arranged.  Pt/wife state DME has not been delivered to room yet; called [name] with [employer].  He states that RW and [facility] will be expedited for delivery ASAP.    Patient is confined to one room and is unable to ambulate to the bathroom, therefore needing a commode at the bedside.      Final next level of care: Home w Home Health Services Barriers to Discharge: Barriers Resolved   Patient Goals and CMS Choice   CMS Medicare.gov Compare Post Acute Care list provided to:: Patient Choice offered to / list presented to : Patient                            Discharge Plan and Services Additional resources added to the After Visit Summary for     Discharge Planning Services: CM Consult Post Acute Care Choice: Home Health          DME Arranged: Bedside commode, 3-N-1, Walker rolling DME Agency: [employer] Date DME Agency Contacted: 05/16/24 Time DME Agency Contacted: 1340 Representative spoke with at DME Agency: [name] HH Arranged: PT, OT HH Agency: [facility] Date [facility] Agency Contacted: 05/15/24 Time HH Agency Contacted: 1515 Representative spoke with at [facility] Agency: [name]  Social Drivers of Health (SDOH) Interventions SDOH Screenings   Food Insecurity: No Food Insecurity (05/13/2024)  Housing: Low Risk  (05/13/2024)  Transportation Needs: No Transportation Needs (05/13/2024)  Utilities: Not At Risk (05/13/2024)  Tobacco Use: Medium Risk (05/13/2024)     Readmission Risk Interventions     No data to display         Mliss MICAEL Fass, RN, BSN  Trauma/Neuro ICU Case Manager [phone]

## 2024-05-16 NOTE — Plan of Care (Signed)

## 2024-05-17 DIAGNOSIS — S72002A Fracture of unspecified part of neck of left femur, initial encounter for closed fracture: Secondary | ICD-10-CM | POA: Diagnosis not present

## 2024-05-17 LAB — CBC
HCT: 24.1 % — ABNORMAL LOW (ref 39.0–52.0)
Hemoglobin: 8.2 g/dL — ABNORMAL LOW (ref 13.0–17.0)
MCH: 29.5 pg (ref 26.0–34.0)
MCHC: 34 g/dL (ref 30.0–36.0)
MCV: 86.7 fL (ref 80.0–100.0)
Platelets: 189 K/uL (ref 150–400)
RBC: 2.78 MIL/uL — ABNORMAL LOW (ref 4.22–5.81)
RDW: 12 % (ref 11.5–15.5)
WBC: 8.1 K/uL (ref 4.0–10.5)
nRBC: 2.5 % — ABNORMAL HIGH (ref 0.0–0.2)

## 2024-05-17 LAB — COMPREHENSIVE METABOLIC PANEL WITH GFR
ALT: 159 U/L — ABNORMAL HIGH (ref 0–44)
AST: 158 U/L — ABNORMAL HIGH (ref 15–41)
Albumin: 2.5 g/dL — ABNORMAL LOW (ref 3.5–5.0)
Alkaline Phosphatase: 91 U/L (ref 38–126)
Anion gap: 10 (ref 5–15)
BUN: 14 mg/dL (ref 8–23)
CO2: 27 mmol/L (ref 22–32)
Calcium: 8.3 mg/dL — ABNORMAL LOW (ref 8.9–10.3)
Chloride: 97 mmol/L — ABNORMAL LOW (ref 98–111)
Creatinine, Ser: 0.83 mg/dL (ref 0.61–1.24)
GFR, Estimated: >60 mL/min (ref >60)
Glucose, Bld: 126 mg/dL — ABNORMAL HIGH (ref 70–99)
Potassium: 3.6 mmol/L (ref 3.5–5.1)
Sodium: 134 mmol/L — ABNORMAL LOW (ref 135–145)
Total Bilirubin: 0.7 mg/dL (ref 0.0–1.2)
Total Protein: 5.1 g/dL — ABNORMAL LOW (ref 6.5–8.1)

## 2024-05-17 MED ORDER — ASPIRIN 81 MG PO CHEW: 81.0000 mg | CHEWABLE_TABLET | Freq: Two times a day (BID) | ORAL | 0 refills | Status: AC

## 2024-05-17 MED ORDER — HYDROCODONE-ACETAMINOPHEN 5-325 MG PO TABS
1.0000 | ORAL_TABLET | ORAL | 0 refills | Status: AC | PRN
Start: 2024-05-17 — End: 2024-05-24

## 2024-05-17 NOTE — TOC Progression Note (Addendum)
 Transition of Care [facility] - Progression Note    Patient Details  Name: [name] Cummings MRN: [id number] Date of Birth: 1961-06-24  Transition of Care [facility]) CM/SW Contact  Bridget Cordella Simmonds, LCSW Phone Number: 05/17/2024, 10:29 AM  Clinical Narrative:   CSW spoke to pt and wife [name] in room, updated that [facility] does not have bed.  [name] upset, called daughter to discuss home vs other SNF options.  CSW came back shortly after, [name] stating she wants to DC home with [facility].    1330: CSW called back to room by RN, wife again asking about SNF placement.  CSW informed her that MD is currently discharging pt home with [facility] as she requested this AM.  Wife again upset, says this is fine.  Asking about purewick, states she just got off the phone with her insurance and was told we can get purewick.  CSW informed her purewick not covered by insurance (CSW confirmed this with [facility]), website info provided for pt to pursue this private pay.  1530: CSW informed that pt wife [name] stating she cannot take pt home, again asking for SNF.  CSW provided bed offers at [facility] and [facility].  Several SNF are still reviewing pt but have not offered beds: [facility], [place], [place]. [name] interested in [employer] or [place].  CSW reached out to [facility] to request response.  [facility] had medication question regarding thorazine .  Her facility will require FF visit before they can offer.     Expected Discharge Plan: Home w Home Health Services Barriers to Discharge: Barriers Resolved               Expected Discharge Plan and Services   Discharge Planning Services: CM Consult Post Acute Care Choice: Home Health Living arrangements for the past 2 months: Single Family Home                 DME Arranged: Bedside commode, 3-N-1, Walker rolling DME Agency: [employer] Date DME Agency Contacted: 05/16/24 Time DME Agency Contacted: 1340 Representative spoke with at DME Agency:  [name] HH Arranged: PT, OT HH Agency: [facility] Date [facility] Agency Contacted: 05/15/24 Time HH Agency Contacted: 1515 Representative spoke with at [facility] Agency: [name]   Social Drivers of Health (SDOH) Interventions SDOH Screenings   Food Insecurity: No Food Insecurity (05/13/2024)  Housing: Low Risk  (05/13/2024)  Transportation Needs: No Transportation Needs (05/13/2024)  Utilities: Not At Risk (05/13/2024)  Tobacco Use: Medium Risk (05/13/2024)    Readmission Risk Interventions     No data to display

## 2024-05-17 NOTE — Progress Notes (Signed)
 Occupational Therapy Treatment Patient Details Name: [name] Cummings MRN: [id number] DOB: 19-Jun-1961 Today's Date: 05/17/2024   History of present illness Pt is 63 yo male who presents on 05/12/24 after falling at home sustaining L hip fx. Underwent L direct anterior THA on 11/14. PMH: anoxic brain injury in setting of VF arrest (2007), CAD, CHF, GERD, seizures, HTN, HLD, depression   OT comments  Pt is slowly progressing towards OT functional goals. Focus of session on progressing functional mobility for increased participation in ADL tasks OOB. Pt required Max A for bed mobility and Mod A +2 for safety to step-pivot to recliner. Pt required continuous encouragement and multimodal cues for sequencing task. Pt with decreased safety awareness and executive functioning skills. Family present throughout session, engaged in discussion regarding d/c recommendation. Based on Pt current functional abilities and family ability to provide physical assistance, OT updating recommendation to  inpatient follow up therapy, <3 hours/day to maximize functional abilities and reduce burden of care with goal of safe return home. OT to continue to follow acutely.        If plan is discharge home, recommend the following:  A lot of help with walking and/or transfers;A lot of help with bathing/dressing/bathroom;Assistance with cooking/housework;Assist for transportation;Help with stairs or ramp for entrance   Equipment Recommendations  BSC/3in1    Recommendations for Other Services      Precautions / Restrictions Precautions Precautions: Fall Recall of Precautions/Restrictions: Impaired Precaution/Restrictions Comments: STM deficits; consistently asking why he is here, why his leg hurts; able to recall THA with cues Restrictions Weight Bearing Restrictions Per Provider Order: Yes LLE Weight Bearing Per Provider Order: Weight bearing as tolerated       Mobility Bed Mobility Overal bed mobility: Needs  Assistance Bed Mobility: Supine to Sit     Supine to sit: Max assist, HOB elevated, Used rails     General bed mobility comments: Pt required continued multimodal cues for bed mobility. Direct tactile cues to move BLE, progressed to requiring Max A to manage BLE off of bed. Pt required initial trunk support from family member present in room, able to maintain sitting balance with CGA once seated EOB with feet on floor. Pt unable to scoot towards EOB without Max A with bed pad.    Transfers Overall transfer level: Needs assistance Equipment used: Rolling walker (2 wheels) Transfers: Sit to/from Stand, Bed to chair/wheelchair/BSC Sit to Stand: Mod assist, From elevated surface     Step pivot transfers: Mod assist     General transfer comment: Mod A for power up from elevated bed height. Mulitmodal cues to facilitate trunk extension and proper hand placement on RW. Family member standing on other side of Pt for encouragement and stabilization of RW. Pt required Mod A management of RW and step-by-step cues for sequencing transfer to the R.     Balance Overall balance assessment: Needs assistance Sitting-balance support: Bilateral upper extremity supported, Feet supported Sitting balance-Leahy Scale: Fair Sitting balance - Comments: Pt unable to maintain body in midline and anterior lean without BUE support. Pt apprehensive to lifting one UE off of bed. Postural control: Posterior lean Standing balance support: Bilateral upper extremity supported, During functional activity, Reliant on assistive device for balance Standing balance-Leahy Scale: Poor Standing balance comment: dependent on RW and external support.                           ADL either performed or assessed with clinical  judgement   ADL Overall ADL's : Needs assistance/impaired     Grooming: Sitting;Minimal assistance       Lower Body Bathing: Maximal assistance       Lower Body Dressing: Maximal  assistance   Toilet Transfer: Moderate assistance;+2 for safety/equipment;Cueing for sequencing;BSC/3in1;Rolling walker (2 wheels)   Toileting- Clothing Manipulation and Hygiene: Total assistance              Extremity/Trunk Assessment Upper Extremity Assessment Upper Extremity Assessment: Overall WFL for tasks assessed            Vision       Perception     Praxis     Communication Communication Communication: No apparent difficulties   Cognition Arousal: Alert Behavior During Therapy: WFL for tasks assessed/performed Cognition: History of cognitive impairments             OT - Cognition Comments: Very brief STM needing repeated education for orientation and sequencing steps.                 Following commands: Impaired Following commands impaired: Follows one step commands inconsistently      Cueing   Cueing Techniques: Verbal cues, Gestural cues, Visual cues  Exercises      Shoulder Instructions       General Comments Family present throughout session, provided encouragement and assistance as needed. Discussed current d/c recommendation of HH services. Family concerned with safe return home based on Pt currently functional abilities. Pt will have to go up stairs to enter home and get in and out of a car. Family stated that they do not think they can provide adequate and safe support in the home environment. Attending Dr. Cindy, RN Suzen Meissner, social worker Cordella Ferry, and case manager Andrez George secure chatted regarding current information and updated OT recommendation of d/c to SNF.    Pertinent Vitals/ Pain       Pain Assessment Pain Assessment: Faces Faces Pain Scale: Hurts even more Pain Location: L hip Pain Descriptors / Indicators: Discomfort, Grimacing, Guarding, Operative site guarding Pain Intervention(s): Limited activity within patient's tolerance, Monitored during session, Repositioned, Premedicated before session  Home  Living                                          Prior Functioning/Environment              Frequency  Min 2X/week        Progress Toward Goals  OT Goals(current goals can now be found in the care plan section)  Progress towards OT goals: Progressing toward goals  Acute Rehab OT Goals Patient Stated Goal: to get better OT Goal Formulation: With patient Time For Goal Achievement: 05/29/24 Potential to Achieve Goals: Good ADL Goals Pt Will Perform Grooming: standing;with supervision Pt Will Perform Lower Body Dressing: with min assist;sit to/from stand Pt Will Transfer to Toilet: with contact guard assist;ambulating  Plan      Co-evaluation                 AM-PAC OT 6 Clicks Daily Activity     Outcome Measure   Help from another person eating meals?: A Little Help from another person taking care of personal grooming?: A Little Help from another person toileting, which includes using toliet, bedpan, or urinal?: Total Help from another person bathing (including washing, rinsing, drying)?: A Lot Help  from another person to put on and taking off regular upper body clothing?: A Little Help from another person to put on and taking off regular lower body clothing?: A Lot 6 Click Score: 14    End of Session Equipment Utilized During Treatment: Gait belt;Rolling walker (2 wheels)  OT Visit Diagnosis: Unsteadiness on feet (R26.81);Muscle weakness (generalized) (M62.81);Other symptoms and signs involving cognitive function   Activity Tolerance Patient limited by pain   Patient Left in chair;with call bell/phone within reach;with family/visitor present   Nurse Communication Mobility status        Time: 1334-1406 OT Time Calculation (min): 32 min  Charges: OT General Charges $OT Visit: 1 Visit OT Treatments $Therapeutic Activity: 23-37 mins  Maurilio CROME, OTR/L.  [facility] Acute Rehabilitation  Office: [phone]   Maurilio PARAS Zaidy Absher 05/17/2024,  2:29 PM

## 2024-05-17 NOTE — TOC Transition Note (Signed)
 Transition of Care [facility]) - Discharge Note   Patient Details  Name: [name] Cummings MRN: [id number] Date of Birth: [birth year]  [facility]) CM/SW Contact:  Landry DELENA Senters, RN Phone Number: 05/17/2024, 1:31 PM   Clinical Narrative:     Patient discharging to home with wife, who will be providing transportation. HH services for therapy set up through [facility], info on AVS. DME ordered via [employer] and sent to patient's room. No other needs identified by CM.   Final next level of care: Home w Home Health Services Barriers to Discharge: No Barriers Identified   Patient Goals and CMS Choice   CMS Medicare.gov Compare Post Acute Care list provided to:: Patient Choice offered to / list presented to : Patient      Discharge Placement                       Discharge Plan and Services Additional resources added to the After Visit Summary for     Discharge Planning Services: CM Consult Post Acute Care Choice: Home Health          DME Arranged: Bedside commode, Walker rolling DME Agency: [employer] Date DME Agency Contacted: 05/16/24 Time DME Agency Contacted: 1340 Representative spoke with at DME Agency: [name] HH Arranged: PT, OT HH Agency: [facility] Date [facility] Agency Contacted: 05/15/24 Time HH Agency Contacted: 1515 Representative spoke with at [facility] Agency: [name]  Social Drivers of Health (SDOH) Interventions SDOH Screenings   Food Insecurity: No Food Insecurity (05/13/2024)  Housing: Low Risk  (05/13/2024)  Transportation Needs: No Transportation Needs (05/13/2024)  Utilities: Not At Risk (05/13/2024)  Tobacco Use: Medium Risk (05/13/2024)     Readmission Risk Interventions     No data to display

## 2024-05-17 NOTE — Discharge Summary (Signed)
 Physician Discharge Summary   Patient: [name] Cummings MRN: [id number] DOB: [birth year]  Admit date:     05/12/2024  Discharge date: 05/17/24  Discharge Physician: Garnette Pelt   PCP: System, Provider Not In   Recommendations at discharge:    Follow up with PCP in 1-2 weeks Follow up with Orthopedic Surgery as scheduled  Discharge Diagnoses: Principal Problem:   Closed left hip fracture [facility]) Active Problems:   Essential hypertension   Ischemic cardiomyopathy   History of ventricular fibrillation   CAD (coronary artery disease)   Brain anoxic injury (HCC)  Resolved Problems:   * No resolved hospital problems. *  Hospital Course: 63 y.o. male with medical history significant for anoxic brain injury, coronary artery disease, hypertension, ventricular fibrillation, seizures. Patient was brought to the ED after a fall.  History is obtained from chart review and talking to family spouse and daughter at bedside.  Due to patient's anoxic brain injury, he has no short-term memory, and repeats questions multiple times.  Patient was walking outside wearing flip-flops when he fell.  Family thinks he may have tripped on the flip-flops, as it has happened before.  His son and daughter were there but did not witness the fall, but saw him on the floor.  He had otherwise been doing well, he ambulates without assistance or assistive devices.  He has no chest pain, no difficulty breathing, no cough.    ED Course: Temperature 98.9.  Heart rate 65-82.  Respiratory 18-27..  O2 sats initially 90 to 93% dropped to 88 percent about an hour after morphine was given. Pelvic x-ray - acute mildly displaced left femoral neck fracture. Chest x-ray-no active disease, age-indeterminate left 5th through 7th lateral rib fractures.  Head and cervical CT negative for acute abnormality, stable ventriculomegaly out of proportion to sulcal enlargement.  IV morphine 4 mg x 1.  1 L bolus.  EDP consulted Dr. Margrette, due to  patient's medical history, patient too complex for any pain anesthesia, recommended transfer to [facility].  EDP talk to Dr. Teresa with [facility], recommends transfer, hold Plavix .  Assessment and Plan: Closed left hip fracture-status post fall.  Unknown reason for fall.  Hip x-ray - Acute mildly displaced left femoral neck fracture  - Orthopedic Surgery was consulted -Pt is now s/p surgery 11/14 -Therapy recs for HHPTOT, however family is also interested in SNF. See SW and CM documentation -Ultimately, pt's wife decided to discharge home with [facility]   Acute hypoxic respiratory failure-likely narcotic induced.   RESOLVED NOW -currently on minimal O2 support   Coronary artery disease history-no chest pain  -Follows with cardiology Dr. Alvan.  History of multiple stents placed between 2007-2023.  With initial bare-metal stents to LAD 2007.  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  LAD was restented at that time, he has had several subsequent stents since then.  Most recent 2023-patient had PCI as opposed to CABG given his cognitive status and after discussions with family. - Cardiology was consulted and had been following - cont asa and plavix  DAPT long term due to L main stenting   Anoxic brain injury/short-term memory deficits- 2/2 heart attack in 2007, ventricular fibrillation.  Repeats questions several times-per family every 10 seconds, but recognizes family.   Hypertension- - cont carvedilol  for now, 25 mg BID    Ischemic cardiomyopathy-volume status stable.  Last echo 2023 EF 55 to 60%.  No on diuretics.   Acute blood loss anemia secondary to surgical blood  loss -Hemodynamically stable -Hgb 8.0 this AM, down from 11.6 following surgery. No obvious source of blood loss -Hgb now stable, 8.2 at time of d/c       Consultants: Orthopedic Surgery, Cardiology Procedures performed: L hip arthoplasty  Disposition: Home Diet recommendation:  Cardiac diet DISCHARGE  MEDICATION: Allergies as of 05/17/2024       Reactions   Sulfamethoxazole-trimethoprim Rash, Other (See Comments)   Sweet's syndrome (Sweet's syndrome is an uncommon skin condition marked by a distinctive eruption of tiny bumps that enlarge and are often tender to the touch.)   Aripiprazole Other (See Comments)   SI    Crestor  [rosuvastatin  Calcium ] Other (See Comments)   Myalgias   Duloxetine Other (See Comments)   SI    Gabapentin Other (See Comments)   Muscle pain    Hydromorphone Hcl Other (See Comments)   Left side went numb Per family pt is allergic   Imdur  [isosorbide  Nitrate] Other (See Comments)   Dizziness   Lorazepam Other (See Comments)   Hallucinations    Olanzapine Other (See Comments)   SI    Pseudoephedrine Other (See Comments)   Caused a seizure   Risperidone Other (See Comments)   Terfenadine Other (See Comments)   Reaction not known   Atorvastatin  Other (See Comments)   Muscle pain   Biaxin [clarithromycin] Rash   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 You also have another medication with the same name that you may need to continue taking.   HYDROcodone -acetaminophen  7.5-325 MG tablet Wait to take this until your doctor or other care provider tells you to start again. Commonly known as: NORCO Take 1 tablet by mouth 5 (five) times daily. You also have another medication with the same name that you may need to continue taking.       TAKE these medications    aspirin  81 MG chewable tablet Commonly known as: Aspirin  Childrens Chew 1 tablet (81 mg total) by mouth 2 (two) times daily with a meal. What changed: Another medication with the same  name was paused. Ask your nurse or doctor if you should take this medication.   busPIRone 30 MG tablet Commonly known as: BUSPAR Take 15 mg by mouth 2 (two) times daily.   carvedilol  25 MG tablet Commonly known as: COREG  Take 1 tablet (25 mg total) by mouth 2 (two) times daily with a meal.   clopidogrel  75 MG tablet Commonly known as: PLAVIX  Take 1 tablet (75 mg total) by mouth daily.   FLUoxetine  20 MG capsule Commonly known as: PROZAC  Take 20 mg by mouth in the morning, at noon, and at bedtime.   fluticasone  50 MCG/ACT nasal spray Commonly known as: FLONASE  Place 1 spray into both nostrils daily as needed for allergies.   HYDROcodone -acetaminophen  5-325 MG tablet Commonly known as: NORCO/VICODIN Take 1 tablet by mouth every 4 (four) hours as needed for up to 7 days for moderate pain (pain score 4-6) or severe pain (pain score 7-10). What changed: Another medication with the same name was paused. Ask your nurse or doctor if you should take this medication.   loratadine  10  MG tablet Commonly known as: CLARITIN  Take 10 mg by mouth in the morning.   nitroGLYCERIN  0.4 MG SL tablet Commonly known as: Nitrostat  Place 1 tablet (0.4 mg total) under the tongue every 5 (five) minutes as needed for chest pain.   ondansetron  4 MG disintegrating tablet Commonly known as: ZOFRAN -ODT Take 4 mg by mouth every 8 (eight) hours as needed for nausea, vomiting or refractory nausea / vomiting.   oxybutynin 10 MG 24 hr tablet Commonly known as: DITROPAN-XL Take 10 mg by mouth in the morning and at bedtime.   pantoprazole  40 MG tablet Commonly known as: PROTONIX  Take 1 tablet (40 mg total) daily by mouth.   pravastatin  80 MG tablet Commonly known as: PRAVACHOL  Take 80 mg by mouth at bedtime.   Repatha  SureClick 140 MG/ML Soaj Generic drug: Evolocumab  Inject 140 mg into the skin every 14 (fourteen) days.   Rexulti 1 MG Tabs tablet Generic drug: brexpiprazole Take 1 mg by mouth at  bedtime.   Ventolin HFA 108 (90 Base) MCG/ACT inhaler Generic drug: albuterol Inhale 1-2 puffs into the lungs every 6 (six) hours as needed for wheezing or shortness of breath.   Vitamin D (Ergocalciferol) 1.25 MG (50000 UNIT) Caps capsule Commonly known as: DRISDOL Take 50,000 Units by mouth every Tuesday.               Durable Medical Equipment  (From admission, onward)           Start     Ordered   05/15/24 1527  For home use only DME Bedside commode  Once       Comments: Patient in room without bathroom access.  Question:  Patient needs a bedside commode to treat with the following condition  Answer:  Weakness   05/15/24 1527   05/15/24 1527  For home use only DME 3 n 1  Once        05/15/24 1527   05/15/24 1526  For home use only DME Walker rolling  Once       Question Answer Comment  Walker: With 5 Inch Wheels   Patient needs a walker to treat with the following condition Weakness      05/15/24 1527            Contact information for follow-up providers     [place], Valery RAMAN, PA-C. Schedule an appointment as soon as possible for a visit in 2 week(s).   Specialty: Orthopedic Surgery Why: For wound re-check Contact information: [address] [place] [address] [phone]         Care, [facility] Follow up.   Specialty: Home Health Services Why: Physical therapy and Occupational therapy.  Office will call to arrange follow up after hospital discharge. Contact information: [address] [place] [address] [phone]         Follow up with your PCP in 1-2 weeks Follow up.   Why: Hospital follow up             Contact information for after-discharge care     Home Medical Care     [facility]) .   Service: Home Health Services Contact information: [address] [place]   [address] [phone]                    Discharge  Exam: Fredricka Weights   05/12/24 1754 05/13/24 0921  Weight: 106.6 kg 107.5 kg  General exam: Awake, laying in bed, in nad Respiratory system: Normal respiratory effort, no wheezing Cardiovascular system: regular rate, s1, s2 Gastrointestinal system: Soft, nondistended, positive BS Central nervous system: CN2-12 grossly intact, strength intact Extremities: Perfused, no clubbing Skin: Normal skin turgor, no notable skin lesions seen Psychiatry: Mood normal // no visual hallucinations   Condition at discharge: fair  The results of significant diagnostics from this hospitalization (including imaging, microbiology, ancillary and laboratory) are listed below for reference.   Imaging Studies: DG Pelvis Portable Result Date: 05/13/2024 EXAM: 1 or 2 VIEW(S) XRAY OF THE PELVIS 05/13/2024 11:15:01 PM COMPARISON: X-ray pelvis done 10/23/2020 CLINICAL HISTORY: [id number] Closed displaced fracture of left femoral neck (HCC) [id number] Closed displaced fracture of left femoral neck [facility]) [id number] FINDINGS: BONES AND JOINTS: No acute fracture. Status post partially visualized total left hip arthroplasty. No radiographic findings to suggest surgical hardware complication. Mild degenerative change of the right hip. No hip dislocation. No pelvic diastasis. SOFT TISSUES: The soft tissues are unremarkable. IMPRESSION: 1. Status post partially visualized total left hip arthroplasty without radiographic evidence of hardware complication or dislocation. Electronically signed by: Morgane Naveau MD 05/13/2024 11:19 PM EST RP Workstation: [id number]   DG HIP PORT UNILAT WITH PELVIS 1V LEFT Result Date: 05/13/2024 CLINICAL DATA:  Hip arthroplasty EXAM: DG HIP (WITH OR WITHOUT PELVIS) 1V PORT LEFT COMPARISON:  05/12/2024 FINDINGS: Two low resolution intraoperative spot views of the left hip. Total fluoroscopy time was 14 seconds, fluoroscopic dose of 1.99 mGy. The images demonstrate a left hip replacement with normal alignment  IMPRESSION: Intraoperative fluoroscopic assistance provided during left hip replacement. Electronically Signed   By: Luke Bun M.D.   On: 05/13/2024 21:02   DG C-Arm 1-60 Min-No Report Result Date: 05/13/2024 Fluoroscopy was utilized by the requesting physician.  No radiographic interpretation.   DG C-Arm 1-60 Min-No Report Result Date: 05/13/2024 Fluoroscopy was utilized by the requesting physician.  No radiographic interpretation.   CT Cervical Spine Wo Contrast Result Date: 05/12/2024 EXAM: CT CERVICAL SPINE WITHOUT CONTRAST 05/12/2024 07:24:00 PM TECHNIQUE: CT of the cervical spine was performed without the administration of intravenous contrast. Multiplanar reformatted images are provided for review. Automated exposure control, iterative reconstruction, and/or weight based adjustment of the mA/kV was utilized to reduce the radiation dose to as low as reasonably achievable. COMPARISON: 10/23/2020 CLINICAL HISTORY: Recent fall with neck pain. FINDINGS: CERVICAL SPINE: BONES AND ALIGNMENT: Loss of the normal cervical lordosis is noted. The odontoid is within normal limits. No acute fracture or acute facet abnormality is seen. DEGENERATIVE CHANGES: Multilevel osteophytic changes are seen. Multilevel facet hypertrophic changes are noted as well. SOFT TISSUES: Surrounding soft tissue structures are unremarkable. Visualized lung apices are within normal limits. IMPRESSION: 1. No acute fracture or acute facet abnormality. 2. Loss of the normal cervical lordosis. 3. Multilevel osteophytic and facet hypertrophic changes. Electronically signed by: Oneil Devonshire MD 05/12/2024 07:30 PM EST RP Workstation: [id number]   CT Head Wo Contrast Result Date: 05/12/2024 EXAM: CT HEAD WITHOUT CONTRAST 05/12/2024 07:24:00 PM TECHNIQUE: CT of the head was performed without the administration of intravenous contrast. Automated exposure control, iterative reconstruction, and/or weight based adjustment of the mA/kV was  utilized to reduce the radiation dose to as low as reasonably achievable. COMPARISON: 4 x 26 x 22 CLINICAL HISTORY: Head trauma, abnormal mental status (Age 17-64y). FINDINGS: BRAIN AND VENTRICLES: No acute hemorrhage. No evidence of acute infarct. Cerebral atrophy. Ventriculomegaly out of proportion to sulcal enlargement. ORBITS: No acute abnormality. SINUSES: No acute  abnormality. SOFT TISSUES AND SKULL: No acute soft tissue abnormality. No skull fracture. IMPRESSION: 1. No acute intracranial abnormality. 2. Stable Ventriculomegaly out of proportion to sulcal enlargement. Electronically signed by: Oneil Devonshire MD 05/12/2024 07:28 PM EST RP Workstation: [id number]   DG Chest 1 View Result Date: 05/12/2024 CLINICAL DATA:  Fall with hip fracture EXAM: CHEST  1 VIEW COMPARISON:  10/23/2020 FINDINGS: Cardiomegaly. No acute airspace disease or effusion. No pneumothorax. Age indeterminate left fifth sixth and seventh lateral rib fractures. IMPRESSION: No active disease. Cardiomegaly. Age indeterminate left fifth through seventh lateral rib fractures. Electronically Signed   By: Luke Bun M.D.   On: 05/12/2024 18:37   DG Hip Unilat W or Wo Pelvis 2-3 Views Left Result Date: 05/12/2024 CLINICAL DATA:  Fall pain EXAM: DG HIP (WITH OR WITHOUT PELVIS) 2-3V LEFT COMPARISON:  10/19/2023 FINDINGS: Acute mildly displaced left femoral neck fracture. No femoral head dislocation. Pubic symphysis and rami are intact. Moderate right hip degenerative change IMPRESSION: Acute mildly displaced left femoral neck fracture. Electronically Signed   By: Luke Bun M.D.   On: 05/12/2024 18:36    Microbiology: Results for orders placed or performed during the hospital encounter of 05/12/24  Surgical pcr screen     Status: None   Collection Time: 05/13/24  5:22 PM   Specimen: Nasal Mucosa; Nasal Swab  Result Value Ref Range Status   MRSA, PCR NEGATIVE NEGATIVE Final   Staphylococcus aureus NEGATIVE NEGATIVE Final     Comment: (NOTE) The Xpert SA Assay (FDA approved for NASAL specimens in patients 63 years of age and older), is one component of a comprehensive surveillance program. It is not intended to diagnose infection nor to guide or monitor treatment. Performed at [facility], [address], [place], [place] [address]     Labs: CBC: Recent Labs  Lab 05/12/24 1800 05/14/24 0402 05/15/24 0701 05/16/24 0434 05/17/24 0407  WBC 7.9 16.5* 11.7* 9.8 8.1  NEUTROABS 5.3  --   --   --   --   HGB 14.0 11.6* 8.9* 8.0* 8.2*  HCT 41.2 33.5* 25.8* 23.4* 24.1*  MCV 86.7 86.3 84.9 85.7 86.7  PLT 240 194 171 152 189   Basic Metabolic Panel: Recent Labs  Lab 05/12/24 1800 05/14/24 0402 05/14/24 0719 05/15/24 0701 05/17/24 0407  NA 139 135 133* 136 134*  K 4.4 4.5 4.1 4.0 3.6  CL 103 98 99 102 97*  CO2 26 26 25 22 27   GLUCOSE 105* 170* 184* 152* 126*  BUN 19 21 23 22 14   CREATININE 1.03 1.19 1.12 0.97 0.83  CALCIUM  9.2 8.6* 8.3* 8.0* 8.3*   Liver Function Tests: Recent Labs  Lab 05/12/24 1800 05/15/24 0701 05/17/24 0407  AST 24 66* 158*  ALT 19 45* 159*  ALKPHOS 136* 63 91  BILITOT 0.6 0.6 0.7  PROT 6.8 5.0* 5.1*  ALBUMIN 4.5 2.7* 2.5*   CBG: No results for input(s): GLUCAP in the last 168 hours.  Discharge time spent: less than 30 minutes.  Signed: Garnette Pelt, MD [facility] 05/17/2024

## 2024-05-17 NOTE — Plan of Care (Signed)

## 2024-05-18 NOTE — TOC Progression Note (Signed)
 Transition of Care [facility]) - Progression Note    Patient Details  Name: [name] Cummings MRN: [id number] Date of Birth: 01/28/1961  [facility] CM/SW Contact  Bridget Cordella Simmonds, LCSW Phone Number: 05/18/2024, 10:16 AM  Clinical Narrative:   CSW informed by RN that pt wife has spoken more with her children and is now planning for pt to DC home with [facility].    Expected Discharge Plan: Home w Home Health Services Barriers to Discharge: No Barriers Identified               Expected Discharge Plan and Services   Discharge Planning Services: CM Consult Post Acute Care Choice: Home Health Living arrangements for the past 2 months: Single Family Home Expected Discharge Date: 05/17/24               DME Arranged: Bedside commode, Walker rolling DME Agency: [employer] Date DME Agency Contacted: 05/16/24 Time DME Agency Contacted: 1340 Representative spoke with at DME Agency: [name] HH Arranged: PT, OT HH Agency: [facility] Date [facility] Agency Contacted: 05/15/24 Time HH Agency Contacted: 1515 Representative spoke with at [facility] Agency: [name]   Social Drivers of Health (SDOH) Interventions SDOH Screenings   Food Insecurity: No Food Insecurity (05/13/2024)  Housing: Low Risk  (05/13/2024)  Transportation Needs: No Transportation Needs (05/13/2024)  Utilities: Not At Risk (05/13/2024)  Tobacco Use: Medium Risk (05/13/2024)    Readmission Risk Interventions     No data to display

## 2024-05-18 NOTE — Progress Notes (Signed)
 Pt discharged home with spouse in stable condition. DC instructions given to wife. Understanding verbalized. DME sent home with pt as well. No immediate questions or concerns at this time.

## 2024-05-18 NOTE — Progress Notes (Signed)
    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  A walker will not resolve issue with performing activities of daily living. A wheelchair will allow patient to safely perform daily activities. Patient can safely propel the wheelchair in the home or has a caregiver who can provide assistance. Length of need 12 months . Accessories: elevating leg rests (ELRs), wheel locks, extensions and anti-tippers.   05/18/24 1035   05/15/24 1527  For home use only DME Bedside commode  Once       Comments: Patient in room without bathroom access.  Question:  Patient needs a bedside commode to treat with the following condition  Answer:  Weakness   05/15/24 1527   05/15/24 1527  For home use only DME 3 n 1  Once        05/15/24 1527   05/15/24 1526  For home use only DME Walker rolling  Once       Question Answer Comment  Walker: With 5 Inch Wheels   Patient needs a walker to treat with the following condition Weakness      05/15/24 1527

## 2024-05-18 NOTE — Progress Notes (Signed)
 Physical Therapy Treatment Patient Details Name: [name] Cummings MRN: [id number] DOB: 04/22/1961 Today's Date: 05/18/2024   History of Present Illness Pt is 63 yo male who presents on 05/12/24 after falling at home sustaining L hip fx. Underwent L direct anterior THA on 11/14. PMH: anoxic brain injury in setting of VF arrest (2007), CAD, CHF, GERD, seizures, HTN, HLD, depression    PT Comments  Pt up in chair on arrival, eager for D/C to home. Spouse present throughout session and supportive. Pt agreeable to chair level session, as pt declining standing activity. Pt performing seated LE therex for increased ROM and strength and verbalizing understanding of importance of HEP and compliance post acutely. Educated pt and spouse on safe ascent/descent of home entry steps with wheelchair and 2 person assist with pt and spouse verbalizing understanding, pt spouse declining handout, as well as importance of OOB mobility, safe car entry/exit and appropriate activity progression. Pt continues to benefit from skilled PT services to progress toward functional mobility goals.     If plan is discharge home, recommend the following: A lot of help with bathing/dressing/bathroom;Assist for transportation;Help with stairs or ramp for entrance;Supervision due to cognitive status;A lot of help with walking and/or transfers   Can travel by private vehicle     No  Equipment Recommendations  Rolling walker (2 wheels) (possibly wheelchair if family unable to locate current one)    Recommendations for Other Services       Precautions / Restrictions Precautions Precautions: Fall Recall of Precautions/Restrictions: Impaired Precaution/Restrictions Comments: STM deficits; consistently asking why he is here, why his leg hurts; able to recall THA with cues Restrictions Weight Bearing Restrictions Per Provider Order: Yes LLE Weight Bearing Per Provider Order: Weight bearing as tolerated     Mobility  Bed  Mobility Overal bed mobility: Needs Assistance             General bed mobility comments: pt up in chair on arrival    Transfers Overall transfer level: Needs assistance                 General transfer comment: pt declining standing trials, session conducted from chair    Ambulation/Gait               General Gait Details: pt declining   Stairs             Wheelchair Mobility     Tilt Bed    Modified Rankin (Stroke Patients Only)       Balance Overall balance assessment: Needs assistance Sitting-balance support: Bilateral upper extremity supported, Feet supported Sitting balance-Leahy Scale: Fair Sitting balance - Comments: Pt unable to maintain body in midline and anterior lean without BUE support. Pt apprehensive to lifting one UE off of bed.                                    Communication Communication Communication: No apparent difficulties  Cognition Arousal: Alert Behavior During Therapy: WFL for tasks assessed/performed   PT - Cognitive impairments: History of cognitive impairments, Memory, Awareness, Attention                       PT - Cognition Comments: STM deficits since 2007 Following commands: Impaired Following commands impaired: Follows one step commands inconsistently    Cueing Cueing Techniques: Verbal cues, Gestural cues, Visual cues  Exercises Total Joint Exercises Ankle  Circles/Pumps: AROM, Both, 10 reps, Seated Long Arc Quad: Both, 10 reps, Seated Marching in Standing: AROM, Both, 10 reps, Seated, AAROM    General Comments General comments (skin integrity, edema, etc.): Wife present and supportive, educated on safe technique to bump wheelchair up entry stairs, wfe declining handout, reviewed importance of contineud mobility and time up OOB throughout day, wife stating pt daughter and son also avalible to assist at home      Pertinent Vitals/Pain Pain Assessment Pain Assessment:  Faces Faces Pain Scale: Hurts little more Pain Location: L hip Pain Descriptors / Indicators: Discomfort, Grimacing, Guarding, Operative site guarding Pain Intervention(s): Monitored during session, Limited activity within patient's tolerance    Home Living                          Prior Function            PT Goals (current goals can now be found in the care plan section) Acute Rehab PT Goals Patient Stated Goal: return home PT Goal Formulation: With patient/family Time For Goal Achievement: 05/28/24 Progress towards PT goals: Progressing toward goals    Frequency    Min 3X/week      PT Plan      Co-evaluation              AM-PAC PT 6 Clicks Mobility   Outcome Measure  Help needed turning from your back to your side while in a flat bed without using bedrails?: Total Help needed moving from lying on your back to sitting on the side of a flat bed without using bedrails?: Total Help needed moving to and from a bed to a chair (including a wheelchair)?: A Lot Help needed standing up from a chair using your arms (e.g., wheelchair or bedside chair)?: A Lot Help needed to walk in hospital room?: A Lot Help needed climbing 3-5 steps with a railing? : A Lot 6 Click Score: 10    End of Session   Activity Tolerance: Patient limited by pain Patient left: with call bell/phone within reach;with family/visitor present;in chair;with chair alarm set Nurse Communication: Mobility status PT Visit Diagnosis: Unsteadiness on feet (R26.81);Muscle weakness (generalized) (M62.81);Pain;Difficulty in walking, not elsewhere classified (R26.2);History of falling (Z91.81) Pain - Right/Left: Left Pain - part of body: Hip     Time: 8976-8963 PT Time Calculation (min) (ACUTE ONLY): 13 min  Charges:    $Therapeutic Activity: 8-22 mins PT General Charges $$ ACUTE PT VISIT: 1 Visit                     Brooks Kinnan R. PTA Acute Rehabilitation Services Office:  [phone]   Therisa CHRISTELLA Boor 05/18/2024, 12:03 PM

## 2024-05-18 NOTE — TOC Transition Note (Signed)
 Transition of Care [facility]) - Discharge Note   Patient Details  Name: [name] Cummings MRN: [id number] Date of Birth: 26-Jul-1960  Transition of Care [facility]) CM/SW Contact:  [name], RN Phone Number: 05/18/2024, 10:30 AM   Clinical Narrative:    Patient will DC to: home Anticipated DC date: 05/18/2024 Family notified: yes Transport by: car         -s/p THA, 11/14  Per MD patient ready for DC today. RN, patient, patient's wife, and [facility] notified of DC. Wife to assist with care @ home. Order placed for W/C per pt's request, MD made of cosign needed. Referral made with [employer]. Equipment will be delivered to pt's home.  Pt without RX med concerns.  Post hospital f/u noted on AVS. Wife to provide transportation to home.  RNCM will sign off for now as intervention is no longer needed. Please consult us  again if new needs arise.   Final next level of care: Home w Home Health Services Barriers to Discharge: No Barriers Identified   Patient Goals and CMS Choice   CMS Medicare.gov Compare Post Acute Care list provided to:: Patient Choice offered to / list presented to : Patient      Discharge Placement                       Discharge Plan and Services Additional resources added to the After Visit Summary for     Discharge Planning Services: CM Consult Post Acute Care Choice: Home Health          DME Arranged: Bedside commode, Walker rolling DME Agency: [employer] Date DME Agency Contacted: 05/16/24 Time DME Agency Contacted: 1340 Representative spoke with at DME Agency: [name] HH Arranged: PT, OT HH Agency: [facility] Date [facility] Agency Contacted: 05/15/24 Time HH Agency Contacted: 1515 Representative spoke with at [facility] Agency: [name]  Social Drivers of Health (SDOH) Interventions SDOH Screenings   Food Insecurity: No Food Insecurity (05/13/2024)  Housing: Low Risk  (05/13/2024)  Transportation Needs: No Transportation Needs (05/13/2024)   Utilities: Not At Risk (05/13/2024)  Tobacco Use: Medium Risk (05/13/2024)     Readmission Risk Interventions     No data to display

## 2024-06-13 ENCOUNTER — Ambulatory Visit: Admitting: Cardiology

## 2024-06-17 ENCOUNTER — Ambulatory Visit (HOSPITAL_COMMUNITY)
Admission: RE | Admit: 2024-06-17 | Discharge: 2024-06-17 | Disposition: A | Discharge status: Home / Self Care | Source: Ambulatory Visit | Attending: Cardiology | Admitting: Cardiology

## 2024-06-17 ENCOUNTER — Ambulatory Visit: Attending: Cardiology | Admitting: Cardiology

## 2024-06-17 ENCOUNTER — Encounter: Payer: Self-pay | Admitting: Cardiology

## 2024-06-17 VITALS — BP 116/80 | HR 65 | Ht 73.5 in | Wt 225.0 lb

## 2024-06-17 DIAGNOSIS — R0602 Shortness of breath: Secondary | ICD-10-CM

## 2024-06-17 DIAGNOSIS — E782 Mixed hyperlipidemia: Secondary | ICD-10-CM

## 2024-06-17 DIAGNOSIS — I10 Essential (primary) hypertension: Secondary | ICD-10-CM

## 2024-06-17 DIAGNOSIS — I25118 Atherosclerotic heart disease of native coronary artery with other forms of angina pectoris: Secondary | ICD-10-CM | POA: Diagnosis not present

## 2024-06-17 NOTE — Progress Notes (Signed)
 "     Clinical Summary [name] is a 63 y.o.male seen today for follow up of the following medical problems.   1. CAD - hx of BMS to LAD in 2007, followed by stent thrombosis with resulting VF arrest. LAD was restented at that time. - cath 2010 LAD 50% prox, 30% ISR in mid LAD, distal LAD 70-80%, LCX 40% ostial, 70-80% narrowing proximal portion LCX in the ramus [name] Cummings, RCA 70-80% proximal. LVEF 45%, the distal LAD was stented.   - cath Jan 2017 severe ISR of LAD stent, received another DES. Small OM1 subtotally occluded with left to left collaterals - echo Jan 2016 LVEF 60-65% - seen in hopsital 08/2015 with atypical chest pain, no evidence of ACS. Imdur  was added.        04/2017 nuclear stress: anterolateral infarct with mild peri-infarct ischemia. Large lateral/inferolateral/inferior/inferioapical defect with moderate ischemia. LVEF 30-44% by nuclear.  - 04/2017 echo LVEF 50-55%, multiple WMAs   -04/2017 cath severe LCX ostial disease, received DES. Moderate ISR of LAD stent. Severe ostial disease of small nondominant RCA.      07/2018 echo LVEF 55-60%, apex hypokinetic - 05/2019 nuclear stress high degree of ischemia   - 05/2019 cath as reported below, received DES to prox LAD - given stent burden and prior stent thrombosis plan for indefinitie DAPT       Jan 2022 lexiscan : Large, moderate intensity, apical to basal anterolateral defect that is partially reversible and consistent with ischemia. - chest pains improved after stress test and decided to monitor clinically - recently wife reports [name] Cummings with increased SOB/DOE, increased chest pains. Occurring daily now - compliant with meds      09/2021 cath: LM 75%, prox to mid LAD 50% and 75%, LCX patent, OM1 99% chronic, ostial RCA 75%. Given poor cognitive status and based on discussions with family elected for PCI as opposed to CABG - 11/06/21: DES to distal LM and ostial LAD - 09/2021 echo: LVEF 55-60%, apical akinesis.   *Plans for lifelong DAPT given stent burden and also LM stent     - last visit some increase in frequency in recent chest pains. No SOB/DOE. Has tried some NG  05/2023 nuclear stress test: lateral infarction with moderate peri-infarct ischemia - since last visit fairly stable symptoms, though difficult for family to asssess due to [name] Cummings's memory issues.     - SOB with laying down. Cough right after the surgery. - Chronic aytpical chest pain.         2. 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 [name] Cummings is not able to give description of symptoms, symptoms are all relayed through his wife.    3. Hyperlipidemia - did not tolerate crestor  or lipitor - has been on pravastatin , then repatha  added   04/2022 TC 170 TG 160 HDL 41 LDL 109. Had run out of repatha  at that time, now back on.     05/2023 TC 142 TG 104 HDL 49 LDL 74 - [name] Cummings is on repatha , pravastatin . Unclear what happened to zetia .    11/2023 TC 132 TG 69 HDL 57 LDL 60  4. Left hip fracture - admit 04/2024, surgery 05/13/24    Past Medical History:  Diagnosis Date   Anxiety    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   CAD (coronary artery disease)    a. s/p BMS to LAD in 2007 with stent thrombosis and VF arrest  and LAD re-stented b. DES to distal LAD in 2010 c. ISR and DES to LAD in 2017 d. DES to LCx in 04/2017 and severe ostial stenosis of small RCA e. 05/2019: DES to proximal LAD.    CHF (congestive heart failure) (HCC)    Depression    Dizziness    Dysphagia, unspecified(787.20)    GERD (gastroesophageal reflux disease)    History of seizure disorder    Hyperlipidemia, mixed    Hypertension    Hypertensive heart disease    unspecified   Ischemic cardiomyopathy    a. 06/2014 Echo: EF 60-65%.   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      Allergies[1]   Current Outpatient Medications  Medication Sig Dispense Refill   aspirin  (ASPIRIN  CHILDRENS) 81 MG chewable tablet Chew 1 tablet (81 mg total) by mouth 2 (two) times daily with a meal. 90 tablet 0   [Paused] aspirin  EC 81 MG tablet Take 81 mg by mouth daily.     busPIRone  (BUSPAR ) 30 MG tablet Take 15 mg by mouth 2 (two) times daily.     carvedilol  (COREG ) 25 MG tablet Take 1 tablet (25 mg total) by mouth 2 (two) times daily with a meal. 180 tablet 2   clopidogrel  (PLAVIX ) 75 MG tablet Take 1 tablet (75 mg total) by mouth daily. 90 tablet 3   Evolocumab  (REPATHA  SURECLICK) 140 MG/ML SOAJ Inject 140 mg into the skin every 14 (fourteen) days. 6 mL 3   FLUoxetine  (PROZAC ) 20 MG capsule Take 20 mg by mouth in the morning, at noon, and at bedtime.     fluticasone  (FLONASE ) 50 MCG/ACT nasal spray Place 1 spray into both nostrils daily as needed for allergies.      [Paused] HYDROcodone -acetaminophen  (NORCO) 7.5-325 MG tablet Take 1 tablet by mouth 5 (five) times daily.     loratadine  (CLARITIN ) 10 MG tablet Take 10 mg by mouth in the morning.     nitroGLYCERIN  (NITROSTAT ) 0.4 MG SL tablet Place 1 tablet (0.4 mg total) under the tongue every 5 (five) minutes as needed for chest pain. 25 tablet 3   ondansetron  (ZOFRAN -ODT) 4 MG disintegrating tablet Take 4 mg by mouth every 8 (eight) hours as needed for nausea, vomiting or refractory nausea / vomiting.     oxybutynin  (DITROPAN -XL) 10 MG 24 hr tablet Take 10 mg by mouth in the morning and at bedtime.     pantoprazole  (PROTONIX ) 40 MG tablet Take 1 tablet (40 mg total) daily by mouth. 90 tablet 3   pravastatin  (PRAVACHOL ) 80 MG tablet Take 80 mg by mouth at bedtime.     REXULTI  1 MG TABS tablet Take 1 mg by mouth at bedtime.     VENTOLIN  HFA 108 (90 Base) MCG/ACT inhaler Inhale 1-2 puffs into the lungs every 6 (six) hours as needed for wheezing or shortness of breath.      Vitamin D, Ergocalciferol, (DRISDOL) 1.25 MG (50000 UT) CAPS  capsule Take 50,000 Units by mouth every Tuesday.     No current facility-administered medications for this visit.     Past Surgical History:  Procedure Laterality Date   bedside tracheostomy     with #6 Shiley. 04/14/2006   CARDIAC CATHETERIZATION  09/2005   with bare metal stent to the mid LAD (Minivision 2.5 x 18 mm) with Provisional balloon angioplasty to the second  diagonal.   CARDIAC CATHETERIZATION  03/2006   with stenting of the mid LAD with Taxus 2.5 x 20 mm and 2.5 x 8 mm stents.       CARDIAC CATHETERIZATION N/A 07/20/2015   Procedure: Left Heart Cath and Coronary Angiography;  Surgeon: Candyce GORMAN Reek, MD;  Location: [facility] INVASIVE CV LAB;  Service: Cardiovascular;  Laterality: N/A;   CARDIAC CATHETERIZATION N/A 07/20/2015   Procedure: Coronary Stent Intervention;  Surgeon: Candyce GORMAN Reek, MD;  Location: [facility] INVASIVE CV LAB;  Service: Cardiovascular;  Laterality: N/A;   COLONOSCOPY WITH PROPOFOL  N/A 12/05/2022   Procedure: COLONOSCOPY WITH PROPOFOL ;  Surgeon: Rollin Dover, MD;  Location: [facility] ENDOSCOPY;  Service: Gastroenterology;  Laterality: N/A;   CORONARY ANGIOGRAPHY N/A 11/06/2021   Procedure: CORONARY ANGIOGRAPHY;  Surgeon: Wonda Sharper, MD;  Location: [facility] INVASIVE CV LAB;  Service: Cardiovascular;  Laterality: N/A;   CORONARY STENT INTERVENTION N/A 05/15/2017   Procedure: CORONARY STENT INTERVENTION;  Surgeon: Wonda Sharper, MD;  Location: [facility] INVASIVE CV LAB;  Service: Cardiovascular;  Laterality: N/A;   CORONARY STENT INTERVENTION N/A 05/27/2019   Procedure: CORONARY STENT INTERVENTION;  Surgeon: Wonda Sharper, MD;  Location: [facility] INVASIVE CV LAB;  Service: Cardiovascular;  Laterality: N/A;   CORONARY STENT INTERVENTION N/A 11/06/2021   Procedure: CORONARY STENT INTERVENTION;  Surgeon: Wonda Sharper, MD;  Location: [facility] INVASIVE CV LAB;  Service: Cardiovascular;  Laterality: N/A;   CORONARY STENT PLACEMENT  07/20/2015   LAD with DES   ESOPHAGOGASTRODUODENOSCOPY      KIDNEY STONE SURGERY     LEFT HEART CATH AND CORONARY ANGIOGRAPHY N/A 05/15/2017   Procedure: LEFT HEART CATH AND CORONARY ANGIOGRAPHY;  Surgeon: Wonda Sharper, MD;  Location: [facility] INVASIVE CV LAB;  Service: Cardiovascular;  Laterality: N/A;   LEFT HEART CATH AND CORONARY ANGIOGRAPHY N/A 05/27/2019   Procedure: LEFT HEART CATH AND CORONARY ANGIOGRAPHY;  Surgeon: Wonda Sharper, MD;  Location: [facility] INVASIVE CV LAB;  Service: Cardiovascular;  Laterality: N/A;   LEFT HEART CATH AND CORONARY ANGIOGRAPHY N/A 10/21/2021   Procedure: LEFT HEART CATH AND CORONARY ANGIOGRAPHY;  Surgeon: Mady Bruckner, MD;  Location: [facility] INVASIVE CV LAB;  Service: Cardiovascular;  Laterality: N/A;   PEG TUBE PLACEMENT     PEG TUBE REMOVAL     POLYPECTOMY  12/05/2022   Procedure: POLYPECTOMY;  Surgeon: Rollin Dover, MD;  Location: [facility] ENDOSCOPY;  Service: Gastroenterology;;   stent thrombosis  03/2006   Complicated my sudden cardiac death   TOTAL HIP ARTHROPLASTY Left 05/13/2024   Procedure: ARTHROPLASTY, HIP, TOTAL, ANTERIOR APPROACH;  Surgeon: Fidel Rogue, MD;  Location: [facility] OR;  Service: Orthopedics;  Laterality: Left;   TRACHEOSTOMY CLOSURE       Allergies[2]    Family History  Problem Relation Age of Onset   Heart attack Mother    Diabetes Mother    Hydrocephalus Mother    Heart attack Father    Cancer Father        unsure of type   Diabetes Sister    Coronary artery disease Neg Hx        Siblings     Social History [name] Cummings reports that [name] Cummings has never smoked. [name] Cummings quit smokeless tobacco use about 18 years ago.  His smokeless tobacco use included chew. [name] Cummings reports no history of alcohol use.    Physical Examination Today's Vitals   06/17/24 1502  BP: 116/80  Pulse: 65  SpO2: 96%  Weight: 225 lb (102.1 kg)  Height: 6' 1.5 (1.867 m)  Body mass index is 29.28 kg/m.  Gen: resting comfortably, no acute distress HEENT: no scleral icterus, pupils equal round and reactive, no  palptable cervical adenopathy,  CV: RRR, no m/rg, no jvd Resp: Clear to auscultation bilaterally GI: abdomen is soft, non-tender, non-distended, normal bowel sounds, no hepatosplenomegaly MSK: extremities are warm, no edema.  Skin: warm, no rash Neuro:  no focal deficits Psych: appropriate affect      Assessment and Plan   1,CAD with angina pectoris - evaluation has always been complicated by his severe memory deficit/prior anoxic brain injury as well as chronic left sided chest pain combined with his extensive prior history of CAD -no recent symptoms, continue current meds  2. SOB - primarily SOB with laying flat since his surgery - does not appear significantly volume overloaded on exam. Family had tried lasix  for a few days without improvement - check chest xray.    3.HLD - essentially at goal, continue current meds   Dorn PHEBE Ross, M.D.     [1]  Allergies Allergen Reactions   Sulfamethoxazole-Trimethoprim Rash and Other (See Comments)    Sweet's syndrome (Sweet's syndrome is an uncommon skin condition marked by a distinctive eruption of tiny bumps that enlarge and are often tender to the touch.)   Aripiprazole Other (See Comments)    SI    Crestor  [Rosuvastatin  Calcium ] Other (See Comments)    Myalgias   Duloxetine Other (See Comments)    SI    Gabapentin Other (See Comments)    Muscle pain    Hydromorphone Hcl Other (See Comments)    Left side went numb Per family pt is allergic   Imdur  [Isosorbide  Nitrate] Other (See Comments)    Dizziness   Lorazepam Other (See Comments)    Hallucinations    Olanzapine Other (See Comments)    SI    Pseudoephedrine Other (See Comments)    Caused a seizure   Risperidone Other (See Comments)   Terfenadine Other (See Comments)    Reaction not known   Atorvastatin  Other (See Comments)    Muscle pain   Biaxin [Clarithromycin] Rash    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 (Sweet's syndrome is an uncommon skin  condition marked by a distinctive eruption of tiny bumps that enlarge and are often tender to the touch.)  [2]  Allergies Allergen Reactions   Sulfamethoxazole-Trimethoprim Rash and Other (See Comments)    Sweet's syndrome (Sweet's syndrome is an uncommon skin condition marked by a distinctive eruption of tiny bumps that enlarge and are often tender to the touch.)   Aripiprazole Other (See Comments)    SI    Crestor  [Rosuvastatin  Calcium ] Other (See Comments)    Myalgias   Duloxetine Other (See Comments)    SI    Gabapentin Other (See Comments)    Muscle pain    Hydromorphone Hcl Other (See Comments)    Left side went numb Per family pt is allergic   Imdur  [Isosorbide  Nitrate] Other (See Comments)    Dizziness   Lorazepam Other (See Comments)    Hallucinations    Olanzapine Other (See Comments)    SI    Pseudoephedrine Other (See Comments)    Caused a seizure   Risperidone Other (See Comments)   Terfenadine Other (See Comments)    Reaction not known   Atorvastatin  Other (See Comments)    Muscle pain   Biaxin [Clarithromycin] Rash    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

## 2024-06-17 NOTE — Patient Instructions (Signed)
 Medication Instructions:  Your physician recommends that you continue on your current medications as directed. Please refer to the Current Medication list given to you today.  *If you need a refill on your cardiac medications before your next appointment, please call your pharmacy*  Lab Work: None If you have labs (blood work) drawn today and your tests are completely normal, you will receive your results only by: MyChart Message (if you have MyChart) OR A paper copy in the mail If you have any lab test that is abnormal or we need to change your treatment, we will call you to review the results.  Testing/Procedures: 2 view chest Xray   Follow-Up: At [facility], you and your health needs are our priority.  As part of our continuing mission to provide you with exceptional heart care, our providers are all part of one team.  This team includes your primary Cardiologist (physician) and Advanced Practice Providers or APPs (Physician Assistants and Nurse Practitioners) who all work together to provide you with the care you need, when you need it.  Your next appointment:   3 month(s)  Provider:   You may see Alvan Carrier, MD or one of the following Advanced Practice Providers on your designated Care Team:   Laymon Qua, PA-C  [employer], [place] Olivia Pavy, [place]     We recommend signing up for the patient portal called MyChart.  Sign up information is provided on this After Visit Summary.  MyChart is used to connect with patients for Virtual Visits (Telemedicine).  Patients are able to view lab/test results, encounter notes, upcoming appointments, etc.  Non-urgent messages can be sent to your provider as well.   To learn more about what you can do with MyChart, go to [email or web address].   Other Instructions

## 2024-07-19 ENCOUNTER — Ambulatory Visit: Payer: Self-pay | Admitting: Cardiology

## 2024-09-21 ENCOUNTER — Ambulatory Visit: Admitting: Physician Assistant
